# Patient Record
Sex: Female | Born: 1945 | Race: White | Hispanic: No | Smoking: Never smoker
Health system: Southern US, Community
[De-identification: ages and names within clinical notes are randomized; demographics above are authoritative.]

## PROBLEM LIST (undated history)

## (undated) DIAGNOSIS — Z8619 Personal history of other infectious and parasitic diseases: Secondary | ICD-10-CM

## (undated) DIAGNOSIS — E039 Hypothyroidism, unspecified: Secondary | ICD-10-CM

## (undated) DIAGNOSIS — T4145XA Adverse effect of unspecified anesthetic, initial encounter: Secondary | ICD-10-CM

## (undated) DIAGNOSIS — Z9889 Other specified postprocedural states: Secondary | ICD-10-CM

## (undated) DIAGNOSIS — I10 Essential (primary) hypertension: Secondary | ICD-10-CM

## (undated) DIAGNOSIS — Z8679 Personal history of other diseases of the circulatory system: Secondary | ICD-10-CM

## (undated) DIAGNOSIS — R011 Cardiac murmur, unspecified: Secondary | ICD-10-CM

## (undated) DIAGNOSIS — G4730 Sleep apnea, unspecified: Secondary | ICD-10-CM

## (undated) DIAGNOSIS — K219 Gastro-esophageal reflux disease without esophagitis: Secondary | ICD-10-CM

## (undated) DIAGNOSIS — D649 Anemia, unspecified: Secondary | ICD-10-CM

## (undated) DIAGNOSIS — K635 Polyp of colon: Secondary | ICD-10-CM

## (undated) DIAGNOSIS — C4492 Squamous cell carcinoma of skin, unspecified: Secondary | ICD-10-CM

## (undated) DIAGNOSIS — R112 Nausea with vomiting, unspecified: Secondary | ICD-10-CM

## (undated) DIAGNOSIS — B019 Varicella without complication: Secondary | ICD-10-CM

## (undated) DIAGNOSIS — T8859XA Other complications of anesthesia, initial encounter: Secondary | ICD-10-CM

## (undated) DIAGNOSIS — E7800 Pure hypercholesterolemia, unspecified: Secondary | ICD-10-CM

## (undated) DIAGNOSIS — G43909 Migraine, unspecified, not intractable, without status migrainosus: Secondary | ICD-10-CM

## (undated) DIAGNOSIS — E785 Hyperlipidemia, unspecified: Secondary | ICD-10-CM

## (undated) DIAGNOSIS — M5030 Other cervical disc degeneration, unspecified cervical region: Secondary | ICD-10-CM

## (undated) HISTORY — DX: Hyperlipidemia, unspecified: E78.5

## (undated) HISTORY — DX: Pure hypercholesterolemia, unspecified: E78.00

## (undated) HISTORY — DX: Hypothyroidism, unspecified: E03.9

## (undated) HISTORY — DX: Anemia, unspecified: D64.9

## (undated) HISTORY — DX: Migraine, unspecified, not intractable, without status migrainosus: G43.909

## (undated) HISTORY — DX: Varicella without complication: B01.9

## (undated) HISTORY — DX: Other cervical disc degeneration, unspecified cervical region: M50.30

## (undated) HISTORY — DX: Essential (primary) hypertension: I10

## (undated) HISTORY — DX: Squamous cell carcinoma of skin, unspecified: C44.92

## (undated) HISTORY — PX: WISDOM TOOTH EXTRACTION: SHX21

## (undated) HISTORY — PX: SKIN BIOPSY: SHX1

## (undated) HISTORY — DX: Gastro-esophageal reflux disease without esophagitis: K21.9

## (undated) HISTORY — DX: Personal history of other infectious and parasitic diseases: Z86.19

## (undated) HISTORY — DX: Polyp of colon: K63.5

---

## 1992-04-18 HISTORY — PX: VAGINAL HYSTERECTOMY: SUR661

## 1997-11-05 ENCOUNTER — Encounter: Admission: RE | Admit: 1997-11-05 | Discharge: 1997-11-05 | Payer: Self-pay | Admitting: Internal Medicine

## 2004-06-16 ENCOUNTER — Ambulatory Visit: Payer: Self-pay

## 2005-08-31 ENCOUNTER — Ambulatory Visit: Payer: Self-pay

## 2006-12-06 ENCOUNTER — Ambulatory Visit: Payer: Self-pay

## 2007-09-03 ENCOUNTER — Ambulatory Visit: Payer: Self-pay | Admitting: Unknown Physician Specialty

## 2008-02-20 ENCOUNTER — Ambulatory Visit: Payer: Self-pay

## 2009-07-22 ENCOUNTER — Ambulatory Visit: Payer: Self-pay

## 2009-07-24 ENCOUNTER — Ambulatory Visit: Payer: Self-pay

## 2010-02-04 ENCOUNTER — Ambulatory Visit: Payer: Self-pay

## 2010-03-01 ENCOUNTER — Ambulatory Visit: Payer: Self-pay | Admitting: Surgery

## 2010-03-02 LAB — PATHOLOGY REPORT

## 2010-04-18 HISTORY — PX: BREAST BIOPSY: SHX20

## 2010-09-08 ENCOUNTER — Ambulatory Visit: Payer: Self-pay | Admitting: Surgery

## 2011-09-01 ENCOUNTER — Ambulatory Visit: Payer: Self-pay | Admitting: Unknown Physician Specialty

## 2011-09-05 LAB — PATHOLOGY REPORT

## 2011-12-21 ENCOUNTER — Ambulatory Visit: Payer: Self-pay | Admitting: Family Medicine

## 2012-03-19 ENCOUNTER — Encounter: Payer: Self-pay | Admitting: Internal Medicine

## 2012-03-19 ENCOUNTER — Ambulatory Visit (INDEPENDENT_AMBULATORY_CARE_PROVIDER_SITE_OTHER): Payer: Medicare Other | Admitting: Internal Medicine

## 2012-03-19 VITALS — BP 150/90 | HR 67 | Temp 98.3°F | Ht 62.5 in | Wt 198.0 lb

## 2012-03-19 DIAGNOSIS — D649 Anemia, unspecified: Secondary | ICD-10-CM

## 2012-03-19 DIAGNOSIS — K219 Gastro-esophageal reflux disease without esophagitis: Secondary | ICD-10-CM

## 2012-03-19 DIAGNOSIS — E7800 Pure hypercholesterolemia, unspecified: Secondary | ICD-10-CM

## 2012-03-19 DIAGNOSIS — I10 Essential (primary) hypertension: Secondary | ICD-10-CM

## 2012-03-19 DIAGNOSIS — E039 Hypothyroidism, unspecified: Secondary | ICD-10-CM

## 2012-03-19 DIAGNOSIS — D539 Nutritional anemia, unspecified: Secondary | ICD-10-CM | POA: Insufficient documentation

## 2012-03-19 LAB — BASIC METABOLIC PANEL
BUN: 13 mg/dL (ref 6–23)
Calcium: 9.3 mg/dL (ref 8.4–10.5)
GFR: 80.87 mL/min (ref >60.00)
Potassium: 3.6 mEq/L (ref 3.5–5.1)
Sodium: 137 mEq/L (ref 135–145)

## 2012-03-19 LAB — CBC WITH DIFFERENTIAL/PLATELET
Basophils Absolute: 0.1 10*3/uL (ref 0.0–0.1)
Eosinophils Absolute: 0.2 10*3/uL (ref 0.0–0.7)
Lymphocytes Relative: 37.7 % (ref 12.0–46.0)
MCHC: 33.4 g/dL (ref 30.0–36.0)
Neutro Abs: 2.9 10*3/uL (ref 1.4–7.7)
Neutrophils Relative %: 50.1 % (ref 43.0–77.0)
RDW: 12.1 % (ref 11.5–14.6)

## 2012-03-19 LAB — LIPID PANEL
Cholesterol: 251 mg/dL — ABNORMAL HIGH (ref 0–200)
HDL: 95.2 mg/dL (ref >39.00)
Total CHOL/HDL Ratio: 3
VLDL: 18 mg/dL (ref 0.0–40.0)

## 2012-03-19 LAB — HEPATIC FUNCTION PANEL
AST: 20 U/L (ref 0–37)
Total Bilirubin: 0.9 mg/dL (ref 0.3–1.2)

## 2012-03-19 LAB — LDL CHOLESTEROL, DIRECT: Direct LDL: 145.5 mg/dL

## 2012-03-19 NOTE — Assessment & Plan Note (Signed)
On Levothyroxine.  Check tsh with next labs.

## 2012-03-19 NOTE — Progress Notes (Signed)
  Subjective:    Patient ID: Amanda Browning, female    DOB: [birth year], 66 y.o.   MRN: [id number]  HPI 66 year old female with past history of hypertension, hypercholesterolemia, hypothyroidism and GERD who comes in today to follow up on these issues as well as to establish care.  Was seeing Darreld Mclean at [facility].  States her blood pressure is normally well controlled.  Taking her meds regularly.  Saw Dr Markham Jordan.  Had EGD.  States - was "raw".  Is s/p dilatation.  Was on 80mg  of Omeprazole.  Trying to cut back now.  On 20mg  bid now.  Having some break through symptoms.  Bowels stable.  Due a follow up colonoscopy 5/14.  Going to [place] 3-5x/week.    Past Medical History  Diagnosis Date  . GERD (gastroesophageal reflux disease)   . Hypertension   . Hypercholesterolemia   . History of chicken pox   . Migraines   . Colon polyps   . Hypothyroidism     Review of Systems Patient denies any headache, lightheadedness or dizziness.  No significant sinus or allergy symptoms.  No chest pain, tightness or palpitations.  No increased shortness of breath, cough or congestion.  No nausea or vomiting. Break through acid reflux as outlined.   No abdominal pain or cramping.  No bowel change, such as diarrhea, constipation, BRBPR or melana.  No urine change. Is exercising.  Going to [facility].         Objective:   Physical Exam Filed Vitals:   03/19/12 0923  BP: 150/90  Pulse: 67  Temp: 98.3 F (38.95 C)   66 year old female in no acute distress.   HEENT:  Nares - clear.  OP- without lesions or erythema.  NECK:  Supple, nontender.  No audible bruit.   HEART:  Appears to be regular.  I/VI systolic murmur.  LUNGS:  Without crackles or wheezing audible.  Respirations even and unlabored.   RADIAL PULSE:  Equal bilaterally.  ABDOMEN:  Soft, nontender.  No audible abdominal bruit.   EXTREMITIES:  No increased edema to be present.                     Assessment & Plan:  CARDIOVASCULAR.   Had a faint murmur on exam today.  She declines ever being told she had a murmur.  Currently asymptomatic.  Wants to hold on ECHO.  Follow.    FATIGUE.  Did report some fatigue.  Overall doing relatively well.  Check cbc, met c and tsh.   PULMONARY.  Breathing stable.  Follow.    HEALTH MAINTENANCE.  Obtain outside records.  Will do physical at next visit.  States she had her mammogram 8/13 at [facility].  Obtain results.

## 2012-03-19 NOTE — Assessment & Plan Note (Signed)
Blood pressure elevated today.  She states is is under better control at home.  Will have her spot check her pressures and send in for my review.  Get her back in soon to reassess.

## 2012-03-19 NOTE — Assessment & Plan Note (Signed)
Low cholesterol diet and exercise.  On lovastatin.  Check lipid panel and liver function.   

## 2012-03-19 NOTE — Patient Instructions (Addendum)
It was nice meeting you today.  I am glad you have been doing well.  Please monitor your blood pressures for me and send in some readings.  Let me know if you need anything.

## 2012-03-19 NOTE — Assessment & Plan Note (Signed)
Bad reflux.  Was previously having significant problems with dysphagia.  Is s/p EGD 6-7/13.  Saw Dr Markham Jordan.  S/p dilation.  Reports tissue was very inflamed.  On prilosec now.  Was on 80mg  q day.  Has cut down to 20mg  bid.  Having break through symptoms.  Will change to protonix 40mg  bid.  Follow.  May need referral back to GI if persistent problems.

## 2012-03-19 NOTE — Assessment & Plan Note (Signed)
Reports a history of anemia.  Due a follow up colonoscopy 5/14.  Just had EGD.  Check cbc.

## 2012-03-24 ENCOUNTER — Telehealth: Payer: Self-pay | Admitting: Internal Medicine

## 2012-03-24 NOTE — Telephone Encounter (Signed)
Pt notified of lab results and the need to increase lovastatin to 40mg  q day.  See lab message.  Wanted called in to [facility]).  Called in #90 with 3 refills to [facility] [phone]).

## 2012-04-15 ENCOUNTER — Encounter: Payer: Self-pay | Admitting: Internal Medicine

## 2012-05-31 ENCOUNTER — Encounter: Payer: Medicare Other | Admitting: Internal Medicine

## 2012-07-16 ENCOUNTER — Ambulatory Visit (INDEPENDENT_AMBULATORY_CARE_PROVIDER_SITE_OTHER): Payer: Medicare Other | Admitting: Internal Medicine

## 2012-07-16 ENCOUNTER — Other Ambulatory Visit (HOSPITAL_COMMUNITY)
Admission: RE | Admit: 2012-07-16 | Discharge: 2012-07-16 | Disposition: A | Discharge status: Home / Self Care | Payer: Medicare Other | Source: Ambulatory Visit | Attending: Internal Medicine | Admitting: Internal Medicine

## 2012-07-16 ENCOUNTER — Encounter: Payer: Self-pay | Admitting: Internal Medicine

## 2012-07-16 VITALS — BP 134/84 | HR 58 | Temp 98.6°F | Ht 62.5 in | Wt 200.0 lb

## 2012-07-16 DIAGNOSIS — Z124 Encounter for screening for malignant neoplasm of cervix: Secondary | ICD-10-CM

## 2012-07-16 DIAGNOSIS — I10 Essential (primary) hypertension: Secondary | ICD-10-CM

## 2012-07-16 DIAGNOSIS — Z01419 Encounter for gynecological examination (general) (routine) without abnormal findings: Secondary | ICD-10-CM | POA: Insufficient documentation

## 2012-07-16 DIAGNOSIS — Z1211 Encounter for screening for malignant neoplasm of colon: Secondary | ICD-10-CM

## 2012-07-16 DIAGNOSIS — E7800 Pure hypercholesterolemia, unspecified: Secondary | ICD-10-CM

## 2012-07-16 DIAGNOSIS — E039 Hypothyroidism, unspecified: Secondary | ICD-10-CM

## 2012-07-16 DIAGNOSIS — D649 Anemia, unspecified: Secondary | ICD-10-CM

## 2012-07-16 DIAGNOSIS — Z1151 Encounter for screening for human papillomavirus (HPV): Secondary | ICD-10-CM | POA: Insufficient documentation

## 2012-07-16 DIAGNOSIS — K219 Gastro-esophageal reflux disease without esophagitis: Secondary | ICD-10-CM

## 2012-07-16 LAB — BASIC METABOLIC PANEL
Chloride: 95 mEq/L — ABNORMAL LOW (ref 96–112)
Creatinine, Ser: 0.8 mg/dL (ref 0.4–1.2)
Potassium: 3.4 mEq/L — ABNORMAL LOW (ref 3.5–5.1)

## 2012-07-16 LAB — HEPATIC FUNCTION PANEL
AST: 19 U/L (ref 0–37)
Albumin: 3.9 g/dL (ref 3.5–5.2)

## 2012-07-16 LAB — LIPID PANEL
Cholesterol: 230 mg/dL — ABNORMAL HIGH (ref 0–200)
Total CHOL/HDL Ratio: 3
Triglycerides: 86 mg/dL (ref 0.0–149.0)

## 2012-07-16 LAB — LDL CHOLESTEROL, DIRECT: Direct LDL: 128.9 mg/dL

## 2012-07-17 ENCOUNTER — Encounter: Payer: Self-pay | Admitting: Internal Medicine

## 2012-07-17 NOTE — Progress Notes (Signed)
Subjective:    Patient ID: Amanda Browning, female    DOB: May 10, 1945, 67 y.o.   MRN: [id number]  HPI 67 year old female with past history of hypertension, hypercholesterolemia, hypothyroidism and GERD who comes in today to follow up on these issues as well as for a complete physical exam.  States her blood pressure is normally well controlled.  Taking her meds regularly.  Saw Dr Markham Jordan.  Had EGD.  States - was "raw".  Is s/p dilatation.  Was on 80mg  of Omeprazole.  Last visit, she was supposed to start protonix.  Still on omeprazole.  She has tried to only take one pill per day.  Having break through symptoms.  Bowels stable.  Due a follow up colonoscopy 5/14.  Eating and drinking well.  No nausea or vomiting.    Past Medical History  Diagnosis Date  . GERD (gastroesophageal reflux disease)   . Hypertension   . Hypercholesterolemia   . History of chicken pox   . Migraines   . Colon polyps   . Hypothyroidism     Current Outpatient Prescriptions on File Prior to Visit  Medication Sig Dispense Refill  . aspirin 81 MG tablet Take 81 mg by mouth daily.      Marland Kitchen lisinopril-hydrochlorothiazide (PRINZIDE,ZESTORETIC) 20-25 MG per tablet Take 1 tablet by mouth daily.      Marland Kitchen lovastatin (MEVACOR) 40 MG tablet Take 40 mg by mouth at bedtime.      . Multiple Vitamin (MULTIVITAMIN) capsule Take 1 capsule by mouth daily.      Marland Kitchen omeprazole (PRILOSEC) 20 MG capsule Take 20 mg by mouth 2 (two) times daily.       No current facility-administered medications on file prior to visit.    Review of Systems Patient denies any headache, lightheadedness or dizziness.  No significant sinus or allergy symptoms.  No chest pain, tightness or palpitations.  No increased shortness of breath, cough or congestion.  No nausea or vomiting. Break through acid reflux as outlined.   No abdominal pain or cramping.  No bowel change, such as diarrhea, constipation, BRBPR or melana.  No urine change. Is exercising.          Objective:   Physical Exam  Filed Vitals:   07/16/12 0914  BP: 134/84  Pulse: 58  Temp: 98.6 F (37 C)   Blood pressure recheck:  55/8  67 year old female in no acute distress.   HEENT:  Nares- clear.  Oropharynx - without lesions. NECK:  Supple.  Nontender.  No audible bruit.  HEART:  Appears to be regular. LUNGS:  No crackles or wheezing audible.  Respirations even and unlabored.  RADIAL PULSE:  Equal bilaterally.    BREASTS:  No nipple discharge.  Left nipple - flat.  Unchanged/stable.   Could not appreciate any distinct nodules or axillary adenopathy.  ABDOMEN:  Soft, nontender.  Bowel sounds present and normal.  No audible abdominal bruit.  GU:  Normal external genitalia.  Vaginal vault without lesions.  S/p hysterectomy.  Could not appreciate any adnexal masses or tenderness.   RECTAL:  Heme negative.   EXTREMITIES:  No increased edema present.  DP pulses palpable and equal bilaterally.             Assessment & Plan:  CARDIOVASCULAR.  Had a faint murmur on exam today.  She declines ever being told she had a murmur.  Currently asymptomatic.  Wants to hold on ECHO.  Follow.    PULMONARY.  Breathing  stable.  Follow.    HEALTH MAINTENANCE.  Physical today.  States she had her mammogram 8/13 at [facility].  Obtain results.  Due colonoscopy 5/14.

## 2012-07-17 NOTE — Assessment & Plan Note (Addendum)
Reports a history of anemia.  Due a follow up colonoscopy 5/14.  Just had EGD.  Follow cbc.  Hgb 03/19/12 - 13.4.

## 2012-07-17 NOTE — Assessment & Plan Note (Signed)
Blood pressure improved today.  Follow.

## 2012-07-17 NOTE — Assessment & Plan Note (Signed)
On Levothyroxine.  Follow tsh.   

## 2012-07-17 NOTE — Assessment & Plan Note (Signed)
Bad reflux.  Was previously having significant problems with dysphagia.  Is s/p EGD 6-7/13.  Saw Dr Markham Jordan.  S/p dilation.  Reports tissue was very inflamed.  On prilosec now.  Was on 80mg  q day.  Has cut down to 20mg  q day to bid.   Having break through symptoms.  Will change to protonix 40mg  bid.  Follow.  Will refer back to GI for evaluation.  (she is due colonoscopy 5/14).

## 2012-07-17 NOTE — Assessment & Plan Note (Signed)
Low cholesterol diet and exercise.  On lovastatin 40 mg now.  Check lipid panel and liver function.

## 2012-07-21 ENCOUNTER — Telehealth: Payer: Self-pay | Admitting: Internal Medicine

## 2012-07-21 DIAGNOSIS — E878 Other disorders of electrolyte and fluid balance, not elsewhere classified: Secondary | ICD-10-CM

## 2012-07-21 NOTE — Telephone Encounter (Signed)
Pt notified of lab results and need for follow up labs.  She is coming 07/30/12 (2:30 ).  Please put on lab schedule.  Pt aware of appt.

## 2012-07-23 NOTE — Telephone Encounter (Signed)
Appointment made

## 2012-07-30 ENCOUNTER — Ambulatory Visit (INDEPENDENT_AMBULATORY_CARE_PROVIDER_SITE_OTHER): Payer: Medicare Other | Admitting: Adult Health

## 2012-07-30 ENCOUNTER — Other Ambulatory Visit: Payer: Self-pay | Admitting: Internal Medicine

## 2012-07-30 ENCOUNTER — Encounter: Payer: Self-pay | Admitting: Adult Health

## 2012-07-30 ENCOUNTER — Other Ambulatory Visit (INDEPENDENT_AMBULATORY_CARE_PROVIDER_SITE_OTHER): Payer: Medicare Other

## 2012-07-30 ENCOUNTER — Other Ambulatory Visit: Payer: Medicare Other

## 2012-07-30 VITALS — BP 110/76 | HR 69 | Temp 98.2°F | Resp 14 | Wt 203.0 lb

## 2012-07-30 DIAGNOSIS — E878 Other disorders of electrolyte and fluid balance, not elsewhere classified: Secondary | ICD-10-CM

## 2012-07-30 DIAGNOSIS — H6123 Impacted cerumen, bilateral: Secondary | ICD-10-CM

## 2012-07-30 DIAGNOSIS — H6120 Impacted cerumen, unspecified ear: Secondary | ICD-10-CM

## 2012-07-30 LAB — ELECTROLYTE PANEL
CO2: 30 mEq/L (ref 19–32)
Chloride: 94 mEq/L — ABNORMAL LOW (ref 96–112)
Sodium: 133 mEq/L — ABNORMAL LOW (ref 135–145)

## 2012-07-30 NOTE — Progress Notes (Signed)
Order placed for a follow up met b 

## 2012-07-30 NOTE — Patient Instructions (Addendum)
  Your left ear was completely occluded by wax. The right ear was 80% occluded by wax.  We irrigated the ears.  Use equal parts of white vinegar and water. Take a cottonball and moisten it in the solution. Allow a few drops in each ear and cover with the moist and cottonball. Allowed to sit for approximately 30 minutes prior to bathing. During shower, gently irrigate the ear to remove excess solution. You may do this twice a week to help keep wax buildup to a minimum.

## 2012-07-30 NOTE — Progress Notes (Signed)
  Subjective:    Patient ID: Amanda Browning, female    DOB: 04-09-1946, 67 y.o.   MRN: [id number]  HPI  Patient is a pleasant 67 year old female who presents to clinic with fullness in both ears. The left is worse than the right. She has not been swimming. She occasionally gets water in her years while showering. No fever, chills, recent respiratory infections or sinus drainage or pressure. She has tried over-the-counter drops without improvement. Patient describes the fullness as sounds being muffled.   Current Outpatient Prescriptions on File Prior to Visit  Medication Sig Dispense Refill  . aspirin 81 MG tablet Take 81 mg by mouth daily.      Marland Kitchen lisinopril-hydrochlorothiazide (PRINZIDE,ZESTORETIC) 20-25 MG per tablet Take 1 tablet by mouth daily.      Marland Kitchen lovastatin (MEVACOR) 40 MG tablet Take 40 mg by mouth at bedtime.      . Multiple Vitamin (MULTIVITAMIN) capsule Take 1 capsule by mouth daily.      Marland Kitchen omeprazole (PRILOSEC) 20 MG capsule Take 20 mg by mouth 2 (two) times daily.       No current facility-administered medications on file prior to visit.    Review of Systems  HENT: Negative for congestion, sore throat, rhinorrhea, postnasal drip and sinus pressure.        Pressure in both ears. Unable to hear well.  Neurological: Negative for dizziness and light-headedness.     BP 110/76  Pulse 69  Temp(Src) 98.2 F (36.8 C) (Oral)  Resp 14  Wt 203 lb (92.08 kg)  BMI 36.51 kg/m2  SpO2 97%    Objective:   Physical Exam  Constitutional: She is oriented to person, place, and time. She appears well-developed and well-nourished. No distress.  HENT:  Head: Normocephalic and atraumatic.  Left ear with complete occlusion secondary to cerumen impaction. Right ear with partial occlusion secondary to cerumen impaction.  Neurological: She is alert and oriented to person, place, and time.  Psychiatric: She has a normal mood and affect. Her behavior is normal. Judgment and thought content  normal.          Assessment & Plan:

## 2012-07-30 NOTE — Assessment & Plan Note (Signed)
Bilateral ear irrigation for disimpaction of cerumen.

## 2012-08-08 ENCOUNTER — Telehealth: Payer: Self-pay | Admitting: *Deleted

## 2012-08-08 MED ORDER — PANTOPRAZOLE SODIUM 40 MG PO TBEC: 40.0000 mg | DELAYED_RELEASE_TABLET | Freq: Every day | ORAL | Status: DC

## 2012-08-08 MED ORDER — LOVASTATIN 40 MG PO TABS: 40.0000 mg | ORAL_TABLET | Freq: Every day | ORAL | Status: DC

## 2012-08-08 NOTE — Telephone Encounter (Signed)
Pt called to report that mail order pharmacy did not receive all of her refills-resent today

## 2012-11-15 ENCOUNTER — Encounter: Payer: Self-pay | Admitting: Internal Medicine

## 2012-11-15 ENCOUNTER — Ambulatory Visit (INDEPENDENT_AMBULATORY_CARE_PROVIDER_SITE_OTHER): Payer: Medicare Other | Admitting: Internal Medicine

## 2012-11-15 VITALS — BP 110/70 | HR 67 | Temp 98.8°F | Ht 62.5 in | Wt 203.5 lb

## 2012-11-15 DIAGNOSIS — E7800 Pure hypercholesterolemia, unspecified: Secondary | ICD-10-CM

## 2012-11-15 DIAGNOSIS — K219 Gastro-esophageal reflux disease without esophagitis: Secondary | ICD-10-CM

## 2012-11-15 DIAGNOSIS — I10 Essential (primary) hypertension: Secondary | ICD-10-CM

## 2012-11-15 DIAGNOSIS — D649 Anemia, unspecified: Secondary | ICD-10-CM

## 2012-11-15 DIAGNOSIS — E039 Hypothyroidism, unspecified: Secondary | ICD-10-CM

## 2012-11-15 MED ORDER — OMEPRAZOLE 20 MG PO CPDR: 20.0000 mg | DELAYED_RELEASE_CAPSULE | Freq: Every day | ORAL | Status: DC

## 2012-11-15 MED ORDER — LISINOPRIL-HYDROCHLOROTHIAZIDE 20-25 MG PO TABS: 1.0000 | ORAL_TABLET | Freq: Every day | ORAL | Status: DC

## 2012-11-15 MED ORDER — LEVOTHYROXINE SODIUM 25 MCG PO TABS: 25.0000 ug | ORAL_TABLET | Freq: Every day | ORAL | Status: DC

## 2012-11-15 MED ORDER — LOVASTATIN 40 MG PO TABS: 40.0000 mg | ORAL_TABLET | Freq: Every day | ORAL | Status: DC

## 2012-11-18 ENCOUNTER — Encounter: Payer: Self-pay | Admitting: Internal Medicine

## 2012-11-18 NOTE — Assessment & Plan Note (Signed)
Low cholesterol diet and exercise.  On lovastatin 40 mg now.  Follow lipid panel and liver function.    

## 2012-11-18 NOTE — Assessment & Plan Note (Signed)
On Levothyroxine.  Follow tsh.   

## 2012-11-18 NOTE — Assessment & Plan Note (Signed)
Blood pressure improved today.  Follow.

## 2012-11-18 NOTE — Progress Notes (Signed)
  Subjective:    Patient ID: Amanda Browning, female    DOB: [birth year], 67 y.o.   MRN: [id number]  HPI 67 year old female with past history of hypertension, hypercholesterolemia, hypothyroidism and GERD who comes in today for a scheduled follow up.  States her blood pressure is normally well controlled.  Taking her meds regularly.  Saw Dr Markham Jordan.  Had EGD.  States - was "raw".  Is s/p dilatation. Was on 80mg  of Omeprazole.  Now on 20mg  q day and this is controlling her symptoms.  Bowels stable.  Was due a follow up colonoscopy 5/14.  Is planning to have her colonoscopy next week. Eating and drinking well.  No nausea or vomiting.  No abdominal pain or cramping.     Past Medical History  Diagnosis Date  . GERD (gastroesophageal reflux disease)   . Hypertension   . Hypercholesterolemia   . History of chicken pox   . Migraines   . Colon polyps   . Hypothyroidism     Current Outpatient Prescriptions on File Prior to Visit  Medication Sig Dispense Refill  . aspirin 81 MG tablet Take 81 mg by mouth daily.      . Multiple Vitamin (MULTIVITAMIN) capsule Take 1 capsule by mouth daily.       No current facility-administered medications on file prior to visit.    Review of Systems Patient denies any headache, lightheadedness or dizziness.  No significant sinus or allergy symptoms.  No chest pain, tightness or palpitations.  No increased shortness of breath, cough or congestion.  No nausea or vomiting.  Acid reflux controlled on omeprazole 20mg  q day.  No abdominal pain or cramping.  No bowel change, such as diarrhea, constipation, BRBPR or melana.  No urine change. Is exercising.         Objective:   Physical Exam  Filed Vitals:   11/15/12 1420  BP: 110/70  Pulse: 67  Temp: 98.8 F (37.1 C)   Blood pressure recheck:  122/76, pulse 21  67 year old female in no acute distress.   HEENT:  Nares- clear.  Oropharynx - without lesions. NECK:  Supple.  Nontender.  No audible bruit.  HEART:   Appears to be regular. LUNGS:  No crackles or wheezing audible.  Respirations even and unlabored.  RADIAL PULSE:  Equal bilaterally.   ABDOMEN:  Soft, nontender.  Bowel sounds present and normal.  No audible abdominal bruit.   EXTREMITIES:  No increased edema present.  DP pulses palpable and equal bilaterally.             Assessment & Plan:  CARDIOVASCULAR.  Had a faint murmur on exam.   She declines ever being told she had a murmur.  Currently asymptomatic.  Wants to hold on ECHO.  Follow.    PULMONARY.  Breathing stable.  Follow.    HEALTH MAINTENANCE.  Physical 07/16/12.  States she had her mammogram 8/13 at [facility].   Given information to schedule her mammogram.  Was due colonoscopy 5/14.  Is scheduled to have colonoscopy next week.

## 2012-11-18 NOTE — Assessment & Plan Note (Signed)
Is s/p EGD 6-7/13.  Saw Dr Elliot.  S/p dilation.  On priloxec 20mg q day now.  Symptoms controlled.  Follow.   

## 2012-11-18 NOTE — Assessment & Plan Note (Signed)
Reports a history of anemia.  Was due a follow up colonoscopy 5/14.  Is scheduled for colonoscopy next week.  Just had EGD.  Follow cbc.  Hgb 03/19/12 - 13.4.

## 2012-11-19 ENCOUNTER — Ambulatory Visit: Payer: Self-pay | Admitting: Unknown Physician Specialty

## 2012-12-03 ENCOUNTER — Encounter: Payer: Self-pay | Admitting: Unknown Physician Specialty

## 2013-01-15 ENCOUNTER — Encounter: Payer: Self-pay | Admitting: Adult Health

## 2013-01-15 ENCOUNTER — Ambulatory Visit (INDEPENDENT_AMBULATORY_CARE_PROVIDER_SITE_OTHER): Payer: Medicare Other | Admitting: Adult Health

## 2013-01-15 VITALS — BP 120/66 | HR 72 | Temp 98.5°F | Resp 12 | Wt 205.0 lb

## 2013-01-15 DIAGNOSIS — M25511 Pain in right shoulder: Secondary | ICD-10-CM

## 2013-01-15 DIAGNOSIS — Z23 Encounter for immunization: Secondary | ICD-10-CM

## 2013-01-15 DIAGNOSIS — M25561 Pain in right knee: Secondary | ICD-10-CM | POA: Insufficient documentation

## 2013-01-15 DIAGNOSIS — M545 Low back pain, unspecified: Secondary | ICD-10-CM | POA: Insufficient documentation

## 2013-01-15 DIAGNOSIS — M25519 Pain in unspecified shoulder: Secondary | ICD-10-CM

## 2013-01-15 NOTE — Assessment & Plan Note (Signed)
This has significantly improved with ibuprofen and applying ice to the area. Suggested that she continue to apply ice to the area and to take ibuprofen every 6 hours for the next week. Stretching exercises and core exercises are beneficial to strengthen her back. Return to clinic if symptoms are not improved within to 4 weeks.

## 2013-01-15 NOTE — Patient Instructions (Addendum)
  Continue to take ibuprofen 400 mg - 600 mg every 6 hours as needed for pain.  Apply ice to your back and right shoulder 3-4 times a day for 15 minutes at a time.  The best way to protect and strengthen your back is by strengthening your abdominal area or core.  Try to rest your shoulder.   If your symptoms do not improve within 2-4 weeks please call and let us know. If your symptoms worsen let us know immediately.

## 2013-01-15 NOTE — Progress Notes (Signed)
  Subjective:    Patient ID: Amanda Browning, female    DOB: [birth year], 67 y.o.   MRN: [id number]  HPI  Pt is a pleasant 67 year old female with history of hypertension, hyperlipidemia, hypothyroidism, obesity who presents to clinic with complaints of low back pain. She reports that on Thursday she was working out at [employer] and did fine but when she was getting in her car, she developed a shooting pain in her mid lower back. Pt states she used ice and Ibuprofen 400mg  and a back brace. Pt denies urinary sx, denies radiation of pain to buttocks, hips, or legs.  Pt states she is also having right shoulder pain x 2 weeks. Pt states pain is worse with movement. Pt states she occasionally feels tingling down her right arm without numbness.  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     Current Outpatient Prescriptions on File Prior to Visit  Medication Sig Dispense Refill  . aspirin 81 MG tablet Take 81 mg by mouth daily.      Marland Kitchen levothyroxine (SYNTHROID, LEVOTHROID) 25 MCG tablet Take 1 tablet (25 mcg total) by mouth daily before breakfast.  90 tablet  3  . lisinopril-hydrochlorothiazide (PRINZIDE,ZESTORETIC) 20-25 MG per tablet Take 1 tablet by mouth daily.  90 tablet  3  . lovastatin (MEVACOR) 40 MG tablet Take 1 tablet (40 mg total) by mouth at bedtime.  90 tablet  3  . Multiple Vitamin (MULTIVITAMIN) capsule Take 1 capsule by mouth daily.      Marland Kitchen omeprazole (PRILOSEC) 20 MG capsule Take 1 capsule (20 mg total) by mouth daily.  90 capsule  3   No current facility-administered medications on file prior to visit.      Review of Systems  Constitutional: Negative.  Negative for fever.  Genitourinary: Negative.  Negative for dysuria, flank pain and difficulty urinating.  Musculoskeletal: Positive for back pain and arthralgias. Negative for gait problem.       Right shoulder pain and low back pain, worse with  movement       Objective:   Physical Exam  Constitutional: She is oriented to person, place, and time.  Musculoskeletal:       Right shoulder: She exhibits pain. She exhibits normal range of motion.       Lumbar back: She exhibits pain. She exhibits normal range of motion.  Right shoulder pain, worse with abduction. Exhibits pain at ~ 90 angle. Patient with normal ROM when bending at the waist. Did not exhibit any pain with bending.  Neurological: She is alert and oriented to person, place, and time.  Skin: Skin is warm and dry.  Psychiatric: She has a normal mood and affect. Her behavior is normal. Judgment and thought content normal.    BP 120/66  Pulse 72  Temp(Src) 98.5 F (36.9 C) (Oral)  Resp 12  Wt 205 lb (92.987 kg)  BMI 36.87 kg/m2  SpO2 98%       Assessment & Plan:

## 2013-01-15 NOTE — Assessment & Plan Note (Signed)
Suspect this is tendinitis of the right shoulder secondary to repetitive movements associated with her job. Use ibuprofen as directed for her low back pain as well as apply ice to the area. Immobilize as much as possible for the next week or so. 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 If no improvement in 2-4 weeks consider referral to orthopedic.

## 2013-01-28 ENCOUNTER — Ambulatory Visit: Payer: Self-pay | Admitting: Internal Medicine

## 2013-02-03 ENCOUNTER — Other Ambulatory Visit: Payer: Self-pay | Admitting: Internal Medicine

## 2013-02-03 MED ORDER — LOVASTATIN 40 MG PO TABS: 40.0000 mg | ORAL_TABLET | Freq: Every day | ORAL | Status: DC

## 2013-02-03 NOTE — Progress Notes (Signed)
Refilled lovastatin #90 with one refill - [facility]

## 2013-02-04 ENCOUNTER — Telehealth: Payer: Self-pay | Admitting: Internal Medicine

## 2013-02-04 NOTE — Telephone Encounter (Signed)
Pt coming in tomorrow to see Raquel-states that the pain usually comes & goes (better right now) usually worse between 4-6pm.

## 2013-02-04 NOTE — Telephone Encounter (Signed)
If in severe pain, sounds like she needs evaluation soon.  See if Raquel can see and then I can follow up with her after.  Thanks.

## 2013-02-04 NOTE — Telephone Encounter (Signed)
Pt is having bad pain in her neck almost cramp like and very painful. Pt is wanting to get in with Dr. Lorin Picket before December.

## 2013-02-05 ENCOUNTER — Ambulatory Visit: Payer: Self-pay | Admitting: Adult Health

## 2013-02-05 ENCOUNTER — Encounter: Payer: Self-pay | Admitting: Adult Health

## 2013-02-05 ENCOUNTER — Ambulatory Visit (INDEPENDENT_AMBULATORY_CARE_PROVIDER_SITE_OTHER): Payer: Medicare Other | Admitting: Adult Health

## 2013-02-05 VITALS — BP 126/92 | HR 68 | Temp 98.4°F | Wt 199.1 lb

## 2013-02-05 DIAGNOSIS — M542 Cervicalgia: Secondary | ICD-10-CM | POA: Insufficient documentation

## 2013-02-05 DIAGNOSIS — M25511 Pain in right shoulder: Secondary | ICD-10-CM

## 2013-02-05 DIAGNOSIS — M25519 Pain in unspecified shoulder: Secondary | ICD-10-CM

## 2013-02-05 DIAGNOSIS — M5030 Other cervical disc degeneration, unspecified cervical region: Secondary | ICD-10-CM

## 2013-02-05 HISTORY — DX: Other cervical disc degeneration, unspecified cervical region: M50.30

## 2013-02-05 MED ORDER — PREDNISONE 10 MG PO TABS: ORAL_TABLET | ORAL | Status: DC

## 2013-02-05 MED ORDER — METHOCARBAMOL 750 MG PO TABS: 750.0000 mg | ORAL_TABLET | Freq: Three times a day (TID) | ORAL | Status: DC
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
## 2013-02-05 NOTE — Patient Instructions (Signed)
  Start the prednisone taper in the morning. Take 6 tablets the first day with food. Then decrease by 1 tablet daily until complete.  Take robaxin three times a day as needed for muscle spasms.   You can also try aleve or ibuprofen  Please go to [facility] for your xrays. I will contact you with results once they are available.  Continue ice to the area. May try heat if you feel this helps.

## 2013-02-06 ENCOUNTER — Encounter: Payer: Self-pay | Admitting: Adult Health

## 2013-02-07 ENCOUNTER — Telehealth: Payer: Self-pay | Admitting: *Deleted

## 2013-02-07 NOTE — Telephone Encounter (Signed)
Called results of xrays to pt: XR cervical spine: "Degenerative changes in neck (age related). No other bony abnormality" and right shoulder xray: "Degenerative changes of shoulder joint" per Raquel. Pt verbalized understanding.

## 2013-02-12 ENCOUNTER — Encounter: Payer: Self-pay | Admitting: Internal Medicine

## 2013-02-25 ENCOUNTER — Encounter: Payer: Self-pay | Admitting: Adult Health

## 2013-03-01 ENCOUNTER — Telehealth: Payer: Self-pay | Admitting: Internal Medicine

## 2013-03-01 NOTE — Telephone Encounter (Signed)
FYI

## 2013-03-01 NOTE — Telephone Encounter (Signed)
Noted  

## 2013-03-01 NOTE — Telephone Encounter (Signed)
Patient Information:  Caller Name: Reagann  Phone: [phone]  Patient: Amanda, Browning  Gender: Female  DOB: [birth year]  Age: 67 Years  PCP: [place]  Office Follow Up:  Does the office need to follow up with this patient?: No  Instructions For The Office: N/A  RN Note:  Does not want Appt, wanting to try home measures first.  Symptoms  Reason For Call & Symptoms: Started voice hoarseness, scratchy throat yesterday pm.  Noting throbbing in Right ear at intervals.  Reviewed Health History In EMR: Yes  Reviewed Medications In EMR: Yes  Reviewed Allergies In EMR: Yes  Reviewed Surgeries / Procedures: Yes  Date of Onset of Symptoms: 02/28/2013  Treatments Tried: throat lozengers  Treatments Tried Worked: Yes  Guideline(s) Used:  Sore Throat  Disposition Per Guideline:   See Today in Office  Reason For Disposition Reached:   Earache also present  Advice Given:  For Relief of Sore Throat Pain:  Sip warm chicken broth or apple juice.  Suck on hard candy or a throat lozenge (over-the-counter).  Gargle warm salt water 3 times daily (1 teaspoon of salt in 8 oz or 240 ml of warm water).  Liquids:  Adequate liquid intake is important to prevent dehydration. Drink 6-8 glasses of water per day.  Expected Course:  Sore throats with viral illnesses usually last 3 or 4 days.  Call Back If:  Sore throat is the main symptom and it lasts longer than 24 hours  Sore throat is mild but lasts longer than 4 days  Fever lasts longer than 3 days  Patient Refused Recommendation:  Patient Will Follow Up With Office Later  Wanting to try home care first

## 2013-03-04 ENCOUNTER — Ambulatory Visit (INDEPENDENT_AMBULATORY_CARE_PROVIDER_SITE_OTHER): Payer: Medicare Other | Admitting: Internal Medicine

## 2013-03-04 ENCOUNTER — Encounter: Payer: Self-pay | Admitting: Internal Medicine

## 2013-03-04 VITALS — BP 130/80 | HR 72 | Temp 98.5°F | Ht 62.5 in | Wt 200.5 lb

## 2013-03-04 DIAGNOSIS — J069 Acute upper respiratory infection, unspecified: Secondary | ICD-10-CM

## 2013-03-04 MED ORDER — CEFUROXIME AXETIL 250 MG PO TABS: 250.0000 mg | ORAL_TABLET | Freq: Two times a day (BID) | ORAL | Status: DC

## 2013-03-04 MED ORDER — FLUTICASONE PROPIONATE 50 MCG/ACT NA SUSP: 2.0000 | Freq: Every day | NASAL | Status: DC

## 2013-03-04 MED ORDER — ALBUTEROL SULFATE HFA 108 (90 BASE) MCG/ACT IN AERS: 2.0000 | INHALATION_SPRAY | Freq: Four times a day (QID) | RESPIRATORY_TRACT | Status: DC | PRN

## 2013-03-04 NOTE — Patient Instructions (Signed)
Saline nasal spray - flush nose at least 2-3x/day.  Flonase nasal spray - 2 sprays each nostril one time per day (do this in the evening).  mucinex - one tablet in the am and robitussin in the evening.   Take the antibiotics as directed.  Albuterol inhaler as directed.  Let me know if problems.

## 2013-03-04 NOTE — Progress Notes (Signed)
  Subjective:    Patient ID: Amanda Browning, female    DOB: Jul 04, 1945, 67 y.o.   MRN: [id number]  URI   68 year old female with past history of hypertension, hypercholesterolemia, hypothyroidism and GERD who comes in today as a work in with concerns regarding increased cough and congestion.  States symptoms started five to six days ago.  Started with a scratchy throat.  Hurt to swallow.  Minimal headache yesterday.  None today.  No sore throat now.  Now with increased nasal congestion - occasional colored mucus.  Some increased cough and congestion.  Cough productive of colored mucus.  Raspy voice.  Some occasional tightness noted, but no significant sob.  No chest pain.  No vomiting.  Tolerating po's.    Past Medical History  Diagnosis Date  . GERD (gastroesophageal reflux disease)   . Hypertension   . Hypercholesterolemia   . History of chicken pox   . Migraines   . Colon polyps   . Hypothyroidism   . DDD (degenerative disc disease), cervical 02/05/13    C5-C6 and C6-C7    Current Outpatient Prescriptions on File Prior to Visit  Medication Sig Dispense Refill  . aspirin 81 MG tablet Take 81 mg by mouth daily.      Marland Kitchen levothyroxine (SYNTHROID, LEVOTHROID) 25 MCG tablet Take 1 tablet (25 mcg total) by mouth daily before breakfast.  90 tablet  3  . lisinopril-hydrochlorothiazide (PRINZIDE,ZESTORETIC) 20-25 MG per tablet Take 1 tablet by mouth daily.  90 tablet  3  . lovastatin (MEVACOR) 40 MG tablet Take 1 tablet (40 mg total) by mouth at bedtime.  90 tablet  1  . Multiple Vitamin (MULTIVITAMIN) capsule Take 1 capsule by mouth daily.      Marland Kitchen omeprazole (PRILOSEC) 20 MG capsule Take 1 capsule (20 mg total) by mouth daily.  90 capsule  3   No current facility-administered medications on file prior to visit.    Review of Systems Patient denies any headache headache now.  Minimal headache yesterday.  No lightheadedness or dizziness.  Some nasal congestion and cough as outlined.  No fever  reported.   No nausea or vomiting.  Acid reflux controlled on omeprazole 20mg  q day.   No bowel change.  Tolerating po's.  Has taken otc benadryl and dayquil.         Objective:   Physical Exam  Filed Vitals:   03/04/13 1136  BP: 130/80  Pulse: 72  Temp: 98.5 F (29.5 C)   67 year old female in no acute distress.   HEENT:  Nares- slightly erythematous turbinates.  No significant tenderness to palpation.  TMs visualized - no erythema.  Oropharynx - without lesions. NECK:  Supple.  Nontender.    HEART:  Appears to be regular. LUNGS:  No crackles or wheezing audible.  Respirations even and unlabored.             Assessment & Plan:  CARDIOVASCULAR.  Had a faint murmur on exam.   She declines ever being told she had a murmur.  Currently asymptomatic.  Wants to hold on ECHO.  Follow.     HEALTH MAINTENANCE.  Physical 07/16/12.  States she had her mammogram 8/13 at [facility].   Given information to schedule her mammogram last visit.

## 2013-03-04 NOTE — Telephone Encounter (Signed)
Pt aware of appointment  11/17 @ 11:45

## 2013-03-04 NOTE — Progress Notes (Signed)
Pre-visit discussion using our clinic review tool. No additional management support is needed unless otherwise documented below in the visit note.  

## 2013-03-04 NOTE — Telephone Encounter (Signed)
Pt still having cold symptoms, no appointments available today.  Can pt be seen today for cold/sore throat?  States she cannot come tomorrow.

## 2013-03-05 ENCOUNTER — Encounter: Payer: Self-pay | Admitting: Internal Medicine

## 2013-03-05 DIAGNOSIS — J069 Acute upper respiratory infection, unspecified: Secondary | ICD-10-CM | POA: Insufficient documentation

## 2013-03-05 NOTE — Assessment & Plan Note (Signed)
Treat with ceftin as directed.  Mucinex in the am and robitussin in the pm.  Saline nasal flushes 2-3x/day and flonase as directed.  Albuterol inhaler as directed.  Rest.  Fluids.  Explained to her if symptoms changed, worsened or did not resolve - she was to be reevaluated.

## 2013-03-19 ENCOUNTER — Encounter (INDEPENDENT_AMBULATORY_CARE_PROVIDER_SITE_OTHER): Payer: Self-pay

## 2013-03-19 ENCOUNTER — Encounter: Payer: Self-pay | Admitting: Internal Medicine

## 2013-03-19 ENCOUNTER — Ambulatory Visit (INDEPENDENT_AMBULATORY_CARE_PROVIDER_SITE_OTHER): Payer: Medicare Other | Admitting: Internal Medicine

## 2013-03-19 VITALS — BP 120/80 | HR 64 | Temp 98.1°F | Ht 62.5 in | Wt 201.0 lb

## 2013-03-19 DIAGNOSIS — M25519 Pain in unspecified shoulder: Secondary | ICD-10-CM

## 2013-03-19 DIAGNOSIS — R252 Cramp and spasm: Secondary | ICD-10-CM | POA: Insufficient documentation

## 2013-03-19 DIAGNOSIS — D649 Anemia, unspecified: Secondary | ICD-10-CM

## 2013-03-19 DIAGNOSIS — K219 Gastro-esophageal reflux disease without esophagitis: Secondary | ICD-10-CM

## 2013-03-19 DIAGNOSIS — E7800 Pure hypercholesterolemia, unspecified: Secondary | ICD-10-CM

## 2013-03-19 DIAGNOSIS — M25511 Pain in right shoulder: Secondary | ICD-10-CM

## 2013-03-19 DIAGNOSIS — E039 Hypothyroidism, unspecified: Secondary | ICD-10-CM

## 2013-03-19 DIAGNOSIS — J069 Acute upper respiratory infection, unspecified: Secondary | ICD-10-CM

## 2013-03-19 DIAGNOSIS — I10 Essential (primary) hypertension: Secondary | ICD-10-CM

## 2013-03-19 LAB — BASIC METABOLIC PANEL
BUN: 13 mg/dL (ref 6–23)
CO2: 29 mEq/L (ref 19–32)
Chloride: 100 mEq/L (ref 96–112)
Creatinine, Ser: 0.9 mg/dL (ref 0.4–1.2)
Glucose, Bld: 86 mg/dL (ref 70–99)
Potassium: 3.6 mEq/L (ref 3.5–5.1)

## 2013-03-19 LAB — HEPATIC FUNCTION PANEL
ALT: 22 U/L (ref 0–35)
AST: 25 U/L (ref 0–37)
Albumin: 3.8 g/dL (ref 3.5–5.2)
Alkaline Phosphatase: 52 U/L (ref 39–117)
Total Protein: 6.3 g/dL (ref 6.0–8.3)

## 2013-03-19 LAB — LIPID PANEL
Cholesterol: 226 mg/dL — ABNORMAL HIGH (ref 0–200)
HDL: 79.6 mg/dL (ref >39.00)
Triglycerides: 82 mg/dL (ref 0.0–149.0)
VLDL: 16.4 mg/dL (ref 0.0–40.0)

## 2013-03-19 LAB — LDL CHOLESTEROL, DIRECT: Direct LDL: 131 mg/dL

## 2013-03-19 NOTE — Assessment & Plan Note (Signed)
Reports a history of anemia.  Just had EGD and colonoscopy.   Follow cbc.  Hgb 03/19/12 - 13.4.

## 2013-03-19 NOTE — Assessment & Plan Note (Signed)
Is s/p EGD 6-7/13.  Saw Dr Elliot.  S/p dilation.  On priloxec 20mg q day now.  Symptoms controlled.  Follow.   

## 2013-03-19 NOTE — Progress Notes (Signed)
Subjective:    Patient ID: Amanda Browning, female    DOB: 1946-03-02, 67 y.o.   MRN: [id number]  HPI 67 year old female with past history of hypertension, hypercholesterolemia, hypothyroidism and GERD who comes in today for a scheduled follow up.  States her blood pressure is doing well.  Taking her meds regularly.  Saw Dr Markham Jordan.  Had EGD.  States - was "raw".  Is s/p dilatation. Was on 80mg  of Omeprazole.  Now on 20mg  q day and this is controlling her symptoms.  Bowels stable.  Colonoscopy 11/19/12 revealed internal hemorrhoids.  Recommended recheck five years.   No nausea or vomiting.  No abdominal pain or cramping.  Was recently evaluated for URI.  See last note for details.  Feels better.  Still has some increased drainage with some associated cough.  No chest congestion or wheezing.  No sob.  She is having some problems with leg cramps.  Occurs most every night.  Is staying hydrated.  She also reports right shoulder pain.  Increased pain with certain movements.  No known injury.     Past Medical History  Diagnosis Date  . GERD (gastroesophageal reflux disease)   . Hypertension   . Hypercholesterolemia   . History of chicken pox   . Migraines   . Colon polyps   . Hypothyroidism   . DDD (degenerative disc disease), cervical 02/05/13    C5-C6 and C6-C7    Current Outpatient Prescriptions on File Prior to Visit  Medication Sig Dispense Refill  . albuterol (PROVENTIL HFA;VENTOLIN HFA) 108 (90 BASE) MCG/ACT inhaler Inhale 2 puffs into the lungs every 6 (six) hours as needed for wheezing or shortness of breath.  1 Inhaler  0  . aspirin 81 MG tablet Take 81 mg by mouth daily.      . fluticasone (FLONASE) 50 MCG/ACT nasal spray Place 2 sprays into both nostrils daily.  16 g  1  . levothyroxine (SYNTHROID, LEVOTHROID) 25 MCG tablet Take 1 tablet (25 mcg total) by mouth daily before breakfast.  90 tablet  3  . lisinopril-hydrochlorothiazide (PRINZIDE,ZESTORETIC) 20-25 MG per tablet Take 1 tablet  by mouth daily.  90 tablet  3  . lovastatin (MEVACOR) 40 MG tablet Take 1 tablet (40 mg total) by mouth at bedtime.  90 tablet  1  . Multiple Vitamin (MULTIVITAMIN) capsule Take 1 capsule by mouth daily.      Marland Kitchen omeprazole (PRILOSEC) 20 MG capsule Take 1 capsule (20 mg total) by mouth daily.  90 capsule  3   No current facility-administered medications on file prior to visit.    Review of Systems Patient denies any headache, lightheadedness or dizziness.  Some drainage.  No chest pain, tightness or palpitations.  No increased shortness of breath.  Some cough.  No chest congestion.   No nausea or vomiting.  Acid reflux controlled on omeprazole 20mg  q day.  No abdominal pain or cramping.  No bowel change, such as diarrhea, constipation, BRBPR or melana.  No urine change.  Leg cramps as outlined.  Right shoulder pain as outlined.        Objective:   Physical Exam  Filed Vitals:   03/19/13 0802  BP: 120/80  Pulse: 64  Temp: 98.1 F (104.38 C)   67 year old female in no acute distress.   HEENT:  Nares- clear.  Oropharynx - without lesions. NECK:  Supple.  Nontender.  No audible bruit.  HEART:  Appears to be regular. LUNGS:  No crackles or  wheezing audible.  Respirations even and unlabored.  RADIAL PULSE:  Equal bilaterally.   ABDOMEN:  Soft, nontender.  Bowel sounds present and normal.  No audible abdominal bruit.   EXTREMITIES:  No increased edema present.  DP pulses palpable and equal bilaterally.  MSK:  Some minimal shoulder discomfort with abduction and full extension.              Assessment & Plan:  CARDIOVASCULAR.  Had a faint murmur on exam.   She declines ever being told she had a murmur.  Currently asymptomatic.  Wants to hold on ECHO.  Follow.    PULMONARY.  Breathing stable.  Follow.    HEALTH MAINTENANCE.  Physical 07/16/12.  Mammogram 01/28/13 - Birads I.  Colonoscopy 11/19/12 - internal hemorrhoids.  Recommend f/u colonoscopy in five years.

## 2013-03-19 NOTE — Assessment & Plan Note (Signed)
Will check electrolytes, calcium and magnesium.  She stays hydrated.  Discussed various treatment options.  May try magnesium.

## 2013-03-19 NOTE — Assessment & Plan Note (Signed)
Better.  Some drainage and cough.  I feel the cough is coming from the drainage.  Lungs are clear.  Continue zyrtec.  Restart the saline nasal flushes and flonase.  Notify me if persistent.

## 2013-03-19 NOTE — Assessment & Plan Note (Signed)
Low cholesterol diet and exercise.  On lovastatin 40 mg now.  Follow lipid panel and liver function.    

## 2013-03-19 NOTE — Assessment & Plan Note (Signed)
Persistent.  Discussed further w/up.  She declines.  Discussed xray.  She wants to monitor.  Will notify me if she changes her mind.

## 2013-03-19 NOTE — Assessment & Plan Note (Signed)
Blood pressure doing well.  Follow.  

## 2013-03-19 NOTE — Assessment & Plan Note (Signed)
On Levothyroxine.  Follow tsh.   

## 2013-03-19 NOTE — Progress Notes (Signed)
Pre-visit discussion using our clinic review tool. No additional management support is needed unless otherwise documented below in the visit note.  

## 2013-03-22 ENCOUNTER — Telehealth: Payer: Self-pay | Admitting: *Deleted

## 2013-03-22 MED ORDER — MAGNESIUM OXIDE 400 MG PO TABS: 400.0000 mg | ORAL_TABLET | Freq: Every day | ORAL | Status: DC

## 2013-03-22 NOTE — Telephone Encounter (Signed)
rx for mag ox sent to pharmacy, see result note. Pt aware

## 2013-05-02 ENCOUNTER — Other Ambulatory Visit: Payer: Self-pay | Admitting: *Deleted

## 2013-05-02 MED ORDER — LOVASTATIN 40 MG PO TABS: 40.0000 mg | ORAL_TABLET | Freq: Every day | ORAL | Status: DC

## 2013-05-02 MED ORDER — LISINOPRIL-HYDROCHLOROTHIAZIDE 20-25 MG PO TABS: 1.0000 | ORAL_TABLET | Freq: Every day | ORAL | Status: DC

## 2013-05-02 MED ORDER — LEVOTHYROXINE SODIUM 25 MCG PO TABS: 25.0000 ug | ORAL_TABLET | Freq: Every day | ORAL | Status: DC

## 2013-05-02 NOTE — Telephone Encounter (Signed)
Refill request  Pantoprazole

## 2013-08-13 ENCOUNTER — Encounter: Payer: Self-pay | Admitting: Internal Medicine

## 2013-08-13 ENCOUNTER — Ambulatory Visit (INDEPENDENT_AMBULATORY_CARE_PROVIDER_SITE_OTHER): Payer: Commercial Managed Care - HMO | Admitting: Internal Medicine

## 2013-08-13 ENCOUNTER — Encounter (INDEPENDENT_AMBULATORY_CARE_PROVIDER_SITE_OTHER): Payer: Self-pay

## 2013-08-13 VITALS — BP 140/80 | HR 61 | Temp 98.1°F | Ht 61.2 in | Wt 203.2 lb

## 2013-08-13 DIAGNOSIS — I10 Essential (primary) hypertension: Secondary | ICD-10-CM

## 2013-08-13 DIAGNOSIS — E7800 Pure hypercholesterolemia, unspecified: Secondary | ICD-10-CM

## 2013-08-13 DIAGNOSIS — D649 Anemia, unspecified: Secondary | ICD-10-CM

## 2013-08-13 DIAGNOSIS — M25511 Pain in right shoulder: Secondary | ICD-10-CM

## 2013-08-13 DIAGNOSIS — M25519 Pain in unspecified shoulder: Secondary | ICD-10-CM

## 2013-08-13 DIAGNOSIS — E039 Hypothyroidism, unspecified: Secondary | ICD-10-CM

## 2013-08-13 DIAGNOSIS — E2839 Other primary ovarian failure: Secondary | ICD-10-CM

## 2013-08-13 DIAGNOSIS — K219 Gastro-esophageal reflux disease without esophagitis: Secondary | ICD-10-CM

## 2013-08-13 LAB — HEPATIC FUNCTION PANEL
ALBUMIN: 4 g/dL (ref 3.5–5.2)
ALT: 20 U/L (ref 0–35)
AST: 24 U/L (ref 0–37)
Alkaline Phosphatase: 52 U/L (ref 39–117)
Bilirubin, Direct: 0.1 mg/dL (ref 0.0–0.3)
TOTAL PROTEIN: 6.5 g/dL (ref 6.0–8.3)
Total Bilirubin: 0.7 mg/dL (ref 0.3–1.2)

## 2013-08-13 LAB — CBC WITH DIFFERENTIAL/PLATELET
BASOS ABS: 0.1 10*3/uL (ref 0.0–0.1)
Basophils Relative: 0.9 % (ref 0.0–3.0)
EOS PCT: 4.5 % (ref 0.0–5.0)
Eosinophils Absolute: 0.3 10*3/uL (ref 0.0–0.7)
HEMATOCRIT: 32.1 % — AB (ref 36.0–46.0)
Hemoglobin: 10.1 g/dL — ABNORMAL LOW (ref 12.0–15.0)
LYMPHS ABS: 2.2 10*3/uL (ref 0.7–4.0)
Lymphocytes Relative: 36.4 % (ref 12.0–46.0)
MCHC: 31.4 g/dL (ref 30.0–36.0)
MCV: 82.6 fl (ref 78.0–100.0)
MONOS PCT: 10.8 % (ref 3.0–12.0)
Monocytes Absolute: 0.7 10*3/uL (ref 0.1–1.0)
Neutro Abs: 2.9 10*3/uL (ref 1.4–7.7)
Neutrophils Relative %: 47.4 % (ref 43.0–77.0)
Platelets: 237 10*3/uL (ref 150.0–400.0)
RBC: 3.89 Mil/uL (ref 3.87–5.11)
RDW: 17.8 % — ABNORMAL HIGH (ref 11.5–14.6)
WBC: 6.1 10*3/uL (ref 4.5–10.5)

## 2013-08-13 LAB — LIPID PANEL
Cholesterol: 204 mg/dL — ABNORMAL HIGH (ref 0–200)
HDL: 88.8 mg/dL (ref >39.00)
LDL CALC: 102 mg/dL — AB (ref 0–99)
TRIGLYCERIDES: 65 mg/dL (ref 0.0–149.0)
Total CHOL/HDL Ratio: 2
VLDL: 13 mg/dL (ref 0.0–40.0)

## 2013-08-13 LAB — BASIC METABOLIC PANEL
BUN: 10 mg/dL (ref 6–23)
CALCIUM: 10.1 mg/dL (ref 8.4–10.5)
CO2: 33 mEq/L — ABNORMAL HIGH (ref 19–32)
Chloride: 100 mEq/L (ref 96–112)
Creatinine, Ser: 0.7 mg/dL (ref 0.4–1.2)
GFR: 91.56 mL/min (ref >60.00)
GLUCOSE: 87 mg/dL (ref 70–99)
POTASSIUM: 3.9 meq/L (ref 3.5–5.1)
Sodium: 141 mEq/L (ref 135–145)

## 2013-08-13 LAB — TSH: TSH: 2.42 u[IU]/mL (ref 0.35–5.50)

## 2013-08-13 LAB — MAGNESIUM: MAGNESIUM: 1.6 mg/dL (ref 1.5–2.5)

## 2013-08-13 MED ORDER — LISINOPRIL-HYDROCHLOROTHIAZIDE 20-25 MG PO TABS: 1.0000 | ORAL_TABLET | Freq: Every day | ORAL | Status: DC

## 2013-08-13 MED ORDER — OMEPRAZOLE 20 MG PO CPDR: 20.0000 mg | DELAYED_RELEASE_CAPSULE | Freq: Every day | ORAL | Status: DC

## 2013-08-13 MED ORDER — LEVOTHYROXINE SODIUM 25 MCG PO TABS: 25.0000 ug | ORAL_TABLET | Freq: Every day | ORAL | Status: DC

## 2013-08-13 MED ORDER — LOVASTATIN 40 MG PO TABS: 40.0000 mg | ORAL_TABLET | Freq: Every day | ORAL | Status: DC

## 2013-08-13 MED ORDER — CETIRIZINE HCL 10 MG PO TABS: 10.0000 mg | ORAL_TABLET | Freq: Every day | ORAL | Status: DC

## 2013-08-13 NOTE — Progress Notes (Signed)
Subjective:    Patient ID: Amanda Browning, female    DOB: May 20, 1945, 68 y.o.   MRN: [id number]  HPI 68 year old female with past history of hypertension, hypercholesterolemia, hypothyroidism and GERD who comes in today to follow up on these issues as well as for a complete physical exam.  States her blood pressure is doing well.  Taking her meds regularly.  Saw Dr Tiffany Kocher.  Had EGD.  States - was "raw".  Is s/p dilatation. Was on 80mg  of Omeprazole. Now on 20mg  q day and this is controlling her symptoms.  Bowels stable.  Colonoscopy 11/19/12 revealed internal hemorrhoids.  Recommended recheck five years.   No nausea or vomiting.  No abdominal pain or cramping.   No chest congestion or wheezing.  No sob or cough.  Her main complaint is that of right shoulder pain.  Increased pulling sensation with using her arm.  Increased pain with abduction.  Has been using essential oils.  Request referral to see ortho (Dr Marry Guan).     Past Medical History  Diagnosis Date  . GERD (gastroesophageal reflux disease)   . Hypertension   . Hypercholesterolemia   . History of chicken pox   . Migraines   . Colon polyps   . Hypothyroidism   . DDD (degenerative disc disease), cervical 02/05/13    C5-C6 and C6-C7    Current Outpatient Prescriptions on File Prior to Visit  Medication Sig Dispense Refill  . albuterol (PROVENTIL HFA;VENTOLIN HFA) 108 (90 BASE) MCG/ACT inhaler Inhale 2 puffs into the lungs every 6 (six) hours as needed for wheezing or shortness of breath.  1 Inhaler  0  . aspirin 81 MG tablet Take 81 mg by mouth daily.      . fluticasone (FLONASE) 50 MCG/ACT nasal spray Place 2 sprays into both nostrils daily.  16 g  1  . levothyroxine (SYNTHROID, LEVOTHROID) 25 MCG tablet Take 1 tablet (25 mcg total) by mouth daily before breakfast.  90 tablet  0  . lisinopril-hydrochlorothiazide (PRINZIDE,ZESTORETIC) 20-25 MG per tablet Take 1 tablet by mouth daily.  90 tablet  1  . lovastatin (MEVACOR) 40 MG tablet  Take 1 tablet (40 mg total) by mouth at bedtime.  90 tablet  1  . magnesium oxide (MAG-OX) 400 MG tablet Take 1 tablet (400 mg total) by mouth daily.  30 tablet  2  . Multiple Vitamin (MULTIVITAMIN) capsule Take 1 capsule by mouth daily.      Marland Kitchen omeprazole (PRILOSEC) 20 MG capsule Take 1 capsule (20 mg total) by mouth daily.  90 capsule  3   No current facility-administered medications on file prior to visit.    Review of Systems Patient denies any headache, lightheadedness or dizziness.  No significant sinus or allergy symptoms.  No chest pain, tightness or palpitations.  No increased shortness of breath.  No cough.  No chest congestion.   No nausea or vomiting.  Acid reflux controlled on omeprazole 20mg  q day.  No abdominal pain or cramping.  No bowel change, such as diarrhea, constipation, BRBPR or melana.  No urine change.   Right shoulder pain as outlined.       Objective:   Physical Exam  Filed Vitals:   08/13/13 0831  BP: 140/80  Pulse: 61  Temp: 98.1 F (59.41 C)   68 year old female in no acute distress.   HEENT:  Nares- clear.  Oropharynx - without lesions. NECK:  Supple.  Nontender.  No audible bruit.  HEART:  Appears to be regular. LUNGS:  No crackles or wheezing audible.  Respirations even and unlabored.  RADIAL PULSE:  Equal bilaterally.    BREASTS:  No nipple discharge or nipple retraction present.  Could not appreciate any distinct nodules or axillary adenopathy.  ABDOMEN:  Soft, nontender.  Bowel sounds present and normal.  No audible abdominal bruit.  GU:  Not performed.     EXTREMITIES:  No increased edema present.  DP pulses palpable and equal bilaterally.            Assessment & Plan:  CARDIOVASCULAR.  Had a faint murmur on exam.   She declines ever being told she had a murmur.  Currently asymptomatic.  Wants to hold on ECHO.  Follow.    PULMONARY.  Breathing stable.  Follow.    HEALTH MAINTENANCE.  Physical today.  Mammogram 01/28/13 - Birads I.  Colonoscopy  11/19/12 - internal hemorrhoids.  Recommend f/u colonoscopy in five years.     I spent 25 minutes with the patient and more than 50% or the time was spent in consultation regarding the above.

## 2013-08-13 NOTE — Progress Notes (Signed)
Pre visit review using our clinic review tool, if applicable. No additional management support is needed unless otherwise documented below in the visit note. 

## 2013-08-14 ENCOUNTER — Other Ambulatory Visit: Payer: Self-pay | Admitting: Internal Medicine

## 2013-08-14 DIAGNOSIS — D649 Anemia, unspecified: Secondary | ICD-10-CM

## 2013-08-14 NOTE — Progress Notes (Signed)
Order placed for f/u labs.  

## 2013-08-16 ENCOUNTER — Other Ambulatory Visit (INDEPENDENT_AMBULATORY_CARE_PROVIDER_SITE_OTHER): Payer: Commercial Managed Care - HMO

## 2013-08-16 DIAGNOSIS — D649 Anemia, unspecified: Secondary | ICD-10-CM

## 2013-08-16 LAB — CBC WITH DIFFERENTIAL/PLATELET
BASOS ABS: 0 10*3/uL (ref 0.0–0.1)
BASOS PCT: 0.7 % (ref 0.0–3.0)
Eosinophils Absolute: 0.2 10*3/uL (ref 0.0–0.7)
Eosinophils Relative: 4 % (ref 0.0–5.0)
HEMATOCRIT: 30.3 % — AB (ref 36.0–46.0)
HEMOGLOBIN: 9.7 g/dL — AB (ref 12.0–15.0)
LYMPHS ABS: 2.2 10*3/uL (ref 0.7–4.0)
LYMPHS PCT: 37.3 % (ref 12.0–46.0)
MCHC: 31.9 g/dL (ref 30.0–36.0)
MCV: 83 fl (ref 78.0–100.0)
Monocytes Absolute: 0.6 10*3/uL (ref 0.1–1.0)
Monocytes Relative: 10.2 % (ref 3.0–12.0)
NEUTROS ABS: 2.9 10*3/uL (ref 1.4–7.7)
Neutrophils Relative %: 47.8 % (ref 43.0–77.0)
Platelets: 224 10*3/uL (ref 150.0–400.0)
RBC: 3.65 Mil/uL — AB (ref 3.87–5.11)
RDW: 17.5 % — AB (ref 11.5–14.6)
WBC: 6 10*3/uL (ref 4.5–10.5)

## 2013-08-16 LAB — VITAMIN B12: Vitamin B-12: 654 pg/mL (ref 211–911)

## 2013-08-16 LAB — IBC PANEL
IRON: 76 ug/dL (ref 42–145)
Saturation Ratios: 16.3 % — ABNORMAL LOW (ref 20.0–50.0)
TRANSFERRIN: 333.2 mg/dL (ref 212.0–360.0)

## 2013-08-16 LAB — FERRITIN: Ferritin: 6.2 ng/mL — ABNORMAL LOW (ref 10.0–291.0)

## 2013-08-17 ENCOUNTER — Encounter: Payer: Self-pay | Admitting: Internal Medicine

## 2013-08-17 NOTE — Assessment & Plan Note (Signed)
On Levothyroxine.  Follow tsh.   

## 2013-08-17 NOTE — Assessment & Plan Note (Signed)
Blood pressure doing well.  Follow.  

## 2013-08-17 NOTE — Assessment & Plan Note (Signed)
Reports a history of anemia.  Just had EGD and colonoscopy.   Follow cbc.  Recheck today.Amanda Browning

## 2013-08-17 NOTE — Assessment & Plan Note (Signed)
Is s/p EGD 6-7/13.  Saw Dr Elliot.  S/p dilation.  On priloxec 20mg q day now.  Symptoms controlled.  Follow.   

## 2013-08-17 NOTE — Assessment & Plan Note (Signed)
Persistent.  Discussed further w/up.  She request referral to ortho given persistent pain and problems.

## 2013-08-17 NOTE — Assessment & Plan Note (Signed)
Low cholesterol diet and exercise.  On lovastatin 40 mg now.  Follow lipid panel and liver function.    

## 2013-08-19 ENCOUNTER — Other Ambulatory Visit: Payer: Self-pay | Admitting: Internal Medicine

## 2013-08-19 DIAGNOSIS — D649 Anemia, unspecified: Secondary | ICD-10-CM

## 2013-08-19 NOTE — Progress Notes (Signed)
Order placed for f/u hgb.

## 2013-08-29 ENCOUNTER — Other Ambulatory Visit (INDEPENDENT_AMBULATORY_CARE_PROVIDER_SITE_OTHER): Payer: Commercial Managed Care - HMO

## 2013-08-29 DIAGNOSIS — D649 Anemia, unspecified: Secondary | ICD-10-CM

## 2013-08-29 LAB — HEMOGLOBIN: HEMOGLOBIN: 11.4 g/dL — AB (ref 12.0–15.0)

## 2013-09-02 ENCOUNTER — Encounter: Payer: Self-pay | Admitting: Internal Medicine

## 2013-09-04 NOTE — Telephone Encounter (Signed)
Unread mychart message mailed to patient 

## 2013-10-21 ENCOUNTER — Ambulatory Visit (INDEPENDENT_AMBULATORY_CARE_PROVIDER_SITE_OTHER): Payer: Commercial Managed Care - HMO | Admitting: Internal Medicine

## 2013-10-21 ENCOUNTER — Encounter: Payer: Self-pay | Admitting: Internal Medicine

## 2013-10-21 VITALS — BP 144/80 | HR 69 | Temp 98.5°F | Ht 61.2 in | Wt 201.2 lb

## 2013-10-21 DIAGNOSIS — E7800 Pure hypercholesterolemia, unspecified: Secondary | ICD-10-CM

## 2013-10-21 DIAGNOSIS — E039 Hypothyroidism, unspecified: Secondary | ICD-10-CM

## 2013-10-21 DIAGNOSIS — N760 Acute vaginitis: Secondary | ICD-10-CM

## 2013-10-21 DIAGNOSIS — D649 Anemia, unspecified: Secondary | ICD-10-CM

## 2013-10-21 DIAGNOSIS — M25519 Pain in unspecified shoulder: Secondary | ICD-10-CM

## 2013-10-21 DIAGNOSIS — Z139 Encounter for screening, unspecified: Secondary | ICD-10-CM

## 2013-10-21 DIAGNOSIS — M25511 Pain in right shoulder: Secondary | ICD-10-CM

## 2013-10-21 DIAGNOSIS — N993 Prolapse of vaginal vault after hysterectomy: Secondary | ICD-10-CM

## 2013-10-21 DIAGNOSIS — I10 Essential (primary) hypertension: Secondary | ICD-10-CM

## 2013-10-21 DIAGNOSIS — K219 Gastro-esophageal reflux disease without esophagitis: Secondary | ICD-10-CM

## 2013-10-21 LAB — CBC WITH DIFFERENTIAL/PLATELET
BASOS ABS: 0 10*3/uL (ref 0.0–0.1)
Basophils Relative: 0.8 % (ref 0.0–3.0)
Eosinophils Absolute: 0.2 10*3/uL (ref 0.0–0.7)
Eosinophils Relative: 2.9 % (ref 0.0–5.0)
HCT: 44.2 % (ref 36.0–46.0)
Hemoglobin: 14.2 g/dL (ref 12.0–15.0)
LYMPHS PCT: 30.4 % (ref 12.0–46.0)
Lymphs Abs: 2 10*3/uL (ref 0.7–4.0)
MCHC: 32.1 g/dL (ref 30.0–36.0)
MCV: 95.7 fl (ref 78.0–100.0)
MONO ABS: 0.6 10*3/uL (ref 0.1–1.0)
Monocytes Relative: 9 % (ref 3.0–12.0)
NEUTROS PCT: 56.9 % (ref 43.0–77.0)
Neutro Abs: 3.7 10*3/uL (ref 1.4–7.7)
PLATELETS: 204 10*3/uL (ref 150.0–400.0)
RBC: 4.62 Mil/uL (ref 3.87–5.11)
RDW: 19.8 % — ABNORMAL HIGH (ref 11.5–15.5)
WBC: 6.4 10*3/uL (ref 4.0–10.5)

## 2013-10-21 LAB — FERRITIN: Ferritin: 18.7 ng/mL (ref 10.0–291.0)

## 2013-10-21 NOTE — Assessment & Plan Note (Signed)
Wet prep obtained.  Await results prior to treatment.

## 2013-10-21 NOTE — Assessment & Plan Note (Signed)
On Levothyroxine.  Follow tsh.

## 2013-10-21 NOTE — Assessment & Plan Note (Signed)
Low cholesterol diet and exercise.  On lovastatin 40 mg now.  Follow lipid panel and liver function.

## 2013-10-21 NOTE — Assessment & Plan Note (Signed)
Resolved

## 2013-10-21 NOTE — Assessment & Plan Note (Signed)
Exam reveals prolapse (vaginal).  Pt confirmed - what she was feeling.  States not bothering her.  Will follow.

## 2013-10-21 NOTE — Assessment & Plan Note (Signed)
Is s/p EGD 6-7/13.  Saw Dr Tiffany Kocher.  S/p dilation.  On priloxec 20mg  q day now.  Symptoms controlled.  Follow.

## 2013-10-21 NOTE — Assessment & Plan Note (Signed)
Blood pressure overall under relatively good control.  Same medications.  Follow pressure.  Follow metabolic panel.

## 2013-10-21 NOTE — Assessment & Plan Note (Signed)
Just had EGD and colonoscopy.  Hgb on a recent check - 9.7.  Was found to be iron deficient.  Wanted to refer back to GI to complete work up for iron deficiency.  Discussed with her today.  Discussed the possibility of a capsule endoscopy.  She declines.  Taking her iron.  Last hgb improved.  Will recheck cbc/ferritin today.  Currently asymptomatic from a GI standpoint.

## 2013-10-21 NOTE — Progress Notes (Signed)
Pre visit review using our clinic review tool, if applicable. No additional management support is needed unless otherwise documented below in the visit note. 

## 2013-10-21 NOTE — Progress Notes (Signed)
Subjective:    Patient ID: Amanda Browning, female    DOB: 21-Jun-1945, 68 y.o.   MRN: [id number]  HPI 68 year old female with past history of hypertension, hypercholesterolemia, hypothyroidism and GERD who comes in today for a scheduled follow up.  Here to f/u regarding her blood pressure.   States her blood pressure is doing relatively well.  Some variation, but overall under reasonable control.  See her attached list for details.  Taking her meds regularly.  Saw Dr Tiffany Kocher.  Had EGD.  States - was "raw".  Is s/p dilatation. Was on 80mg  of Omeprazole. Now on 20mg  q day and this is controlling her symptoms.  Bowels stable.  Colonoscopy 11/19/12 revealed internal hemorrhoids.  Recommended recheck five years.   No nausea or vomiting.  No abdominal pain or cramping.   No chest congestion or wheezing.  No sob or cough.  Was having right shoulder pain.  Saw Barn[facility] Pall.  S/p injection.  Shoulder doing better.  Pain resolved.  Noticed a "lump" in her vaginal area.  No pain.  Some vaginal discharge.  No bleeding.     Past Medical History  Diagnosis Date  . GERD (gastroesophageal reflux disease)   . Hypertension   . Hypercholesterolemia   . History of chicken pox   . Migraines   . Colon polyps   . Hypothyroidism   . DDD (degenerative disc disease), cervical 02/05/13    C5-C6 and C6-C7    Current Outpatient Prescriptions on File Prior to Visit  Medication Sig Dispense Refill  . albuterol (PROVENTIL HFA;VENTOLIN HFA) 108 (90 BASE) MCG/ACT inhaler Inhale 2 puffs into the lungs every 6 (six) hours as needed for wheezing or shortness of breath.  1 Inhaler  0  . aspirin 81 MG tablet Take 81 mg by mouth daily.      . cetirizine (ZYRTEC) 10 MG tablet Take 1 tablet (10 mg total) by mouth daily.  90 tablet  3  . fluticasone (FLONASE) 50 MCG/ACT nasal spray Place 2 sprays into both nostrils daily.  16 g  1  . levothyroxine (SYNTHROID, LEVOTHROID) 25 MCG tablet Take 1 tablet (25 mcg total) by mouth daily before  breakfast.  90 tablet  3  . lisinopril-hydrochlorothiazide (PRINZIDE,ZESTORETIC) 20-25 MG per tablet Take 1 tablet by mouth daily.  90 tablet  3  . lovastatin (MEVACOR) 40 MG tablet Take 1 tablet (40 mg total) by mouth at bedtime.  90 tablet  3  . magnesium oxide (MAG-OX) 400 MG tablet Take 1 tablet (400 mg total) by mouth daily.  30 tablet  2  . Multiple Vitamin (MULTIVITAMIN) capsule Take 1 capsule by mouth daily.      Marland Kitchen omeprazole (PRILOSEC) 20 MG capsule Take 1 capsule (20 mg total) by mouth daily.  90 capsule  3   No current facility-administered medications on file prior to visit.    Review of Systems Patient denies any headache, lightheadedness or dizziness.  No significant sinus or allergy symptoms.  No chest pain, tightness or palpitations.  No increased shortness of breath.  No cough.  No chest congestion.   No nausea or vomiting.  Acid reflux controlled on omeprazole 20mg  q day.  No abdominal pain or cramping.  No bowel change, such as diarrhea, constipation, BRBPR or melana.  No urine change.   Shoulder pain resolved.  Vaginal symptoms as outlined.       Objective:   Physical Exam  Filed Vitals:   10/21/13 0852  BP: 144/80  Pulse: 69  Temp: 98.5 F (36.9 C)   Blood pressure recheck:  54/65  68 year old female in no acute distress.   HEENT:  Nares- clear.  Oropharynx - without lesions. NECK:  Supple.  Nontender.  No audible bruit.  HEART:  Appears to be regular. LUNGS:  No crackles or wheezing audible.  Respirations even and unlabored.  RADIAL PULSE:  Equal bilaterally.    BREASTS:  No nipple discharge or nipple retraction present.  Could not appreciate any distinct nodules or axillary adenopathy.  ABDOMEN:  Soft, nontender.  Bowel sounds present and normal.  No audible abdominal bruit.  GU:  Normal external genitalia.  Vaginal vault without lesions.  S/P hysterectomy.  Could not appreciate any adnexal masses or tenderness.  Did have  Vaginal/rectal prolapse. (she  pointed that this was what she was noticing).   EXTREMITIES:  No increased edema present.  DP pulses palpable and equal bilaterally.          Assessment & Plan:  CARDIOVASCULAR.  Had a faint murmur on exam.   She declines ever being told she had a murmur.  Currently asymptomatic.  Has wanted to hold on ECHO.  Follow.    PULMONARY.  Breathing stable.  Follow.    HEALTH MAINTENANCE.  Physical 08/13/13.  Mammogram 01/28/13 - Birads I.  Colonoscopy 11/19/12 - internal hemorrhoids.  Recommend f/u colonoscopy in five years.     I spent 25 minutes with the patient and more than 50% or the time was spent in consultation regarding the above.

## 2013-10-22 ENCOUNTER — Encounter: Payer: Self-pay | Admitting: Internal Medicine

## 2013-10-22 LAB — WET PREP BY MOLECULAR PROBE
CANDIDA SPECIES: NEGATIVE
Gardnerella vaginalis: NEGATIVE
TRICHOMONAS VAG: NEGATIVE

## 2013-10-24 NOTE — Telephone Encounter (Signed)
Unread mychart message mailed to patient with lab appt

## 2013-11-20 ENCOUNTER — Other Ambulatory Visit: Payer: Commercial Managed Care - HMO

## 2013-12-05 ENCOUNTER — Other Ambulatory Visit: Payer: Commercial Managed Care - HMO

## 2013-12-05 ENCOUNTER — Telehealth: Payer: Self-pay | Admitting: *Deleted

## 2013-12-05 ENCOUNTER — Other Ambulatory Visit (INDEPENDENT_AMBULATORY_CARE_PROVIDER_SITE_OTHER): Payer: Commercial Managed Care - HMO

## 2013-12-05 DIAGNOSIS — D649 Anemia, unspecified: Secondary | ICD-10-CM

## 2013-12-05 NOTE — Addendum Note (Signed)
Addended by: Alisa Graff on: 12/05/2013 03:37 PM   Modules accepted: Orders

## 2013-12-05 NOTE — Telephone Encounter (Signed)
Labs ordered.

## 2013-12-05 NOTE — Telephone Encounter (Signed)
Labs ordered. Thanks!

## 2013-12-05 NOTE — Telephone Encounter (Signed)
What labs and dx?  

## 2013-12-06 LAB — CBC WITH DIFFERENTIAL/PLATELET
BASOS PCT: 0.3 % (ref 0.0–3.0)
Basophils Absolute: 0 10*3/uL (ref 0.0–0.1)
EOS PCT: 4.3 % (ref 0.0–5.0)
Eosinophils Absolute: 0.3 10*3/uL (ref 0.0–0.7)
HCT: 43.3 % (ref 36.0–46.0)
HEMOGLOBIN: 14.2 g/dL (ref 12.0–15.0)
LYMPHS PCT: 33.1 % (ref 12.0–46.0)
Lymphs Abs: 1.9 10*3/uL (ref 0.7–4.0)
MCHC: 32.9 g/dL (ref 30.0–36.0)
MCV: 98.5 fl (ref 78.0–100.0)
MONOS PCT: 7.9 % (ref 3.0–12.0)
Monocytes Absolute: 0.5 10*3/uL (ref 0.1–1.0)
NEUTROS ABS: 3.2 10*3/uL (ref 1.4–7.7)
NEUTROS PCT: 54.4 % (ref 43.0–77.0)
Platelets: 225 10*3/uL (ref 150.0–400.0)
RBC: 4.39 Mil/uL (ref 3.87–5.11)
RDW: 13.6 % (ref 11.5–15.5)
WBC: 5.9 10*3/uL (ref 4.0–10.5)

## 2013-12-07 LAB — FERRITIN: Ferritin: 26.2 ng/mL (ref 10.0–291.0)

## 2013-12-08 ENCOUNTER — Encounter: Payer: Self-pay | Admitting: Internal Medicine

## 2013-12-11 NOTE — Telephone Encounter (Signed)
Unread mychart message mailed to patient 

## 2014-01-21 ENCOUNTER — Other Ambulatory Visit: Payer: Commercial Managed Care - HMO

## 2014-01-23 ENCOUNTER — Ambulatory Visit (INDEPENDENT_AMBULATORY_CARE_PROVIDER_SITE_OTHER): Payer: Commercial Managed Care - HMO | Admitting: Internal Medicine

## 2014-01-23 ENCOUNTER — Other Ambulatory Visit (INDEPENDENT_AMBULATORY_CARE_PROVIDER_SITE_OTHER): Payer: Commercial Managed Care - HMO

## 2014-01-23 ENCOUNTER — Encounter: Payer: Self-pay | Admitting: Internal Medicine

## 2014-01-23 VITALS — BP 130/80 | HR 67 | Temp 98.0°F | Ht 61.2 in | Wt 205.5 lb

## 2014-01-23 DIAGNOSIS — Z1239 Encounter for other screening for malignant neoplasm of breast: Secondary | ICD-10-CM

## 2014-01-23 DIAGNOSIS — E7800 Pure hypercholesterolemia, unspecified: Secondary | ICD-10-CM

## 2014-01-23 DIAGNOSIS — E2839 Other primary ovarian failure: Secondary | ICD-10-CM

## 2014-01-23 DIAGNOSIS — D649 Anemia, unspecified: Secondary | ICD-10-CM

## 2014-01-23 DIAGNOSIS — Z23 Encounter for immunization: Secondary | ICD-10-CM

## 2014-01-23 DIAGNOSIS — R252 Cramp and spasm: Secondary | ICD-10-CM

## 2014-01-23 DIAGNOSIS — K219 Gastro-esophageal reflux disease without esophagitis: Secondary | ICD-10-CM

## 2014-01-23 DIAGNOSIS — E039 Hypothyroidism, unspecified: Secondary | ICD-10-CM

## 2014-01-23 DIAGNOSIS — I10 Essential (primary) hypertension: Secondary | ICD-10-CM

## 2014-01-23 LAB — COMPREHENSIVE METABOLIC PANEL
ALK PHOS: 51 U/L (ref 39–117)
ALT: 19 U/L (ref 0–35)
AST: 21 U/L (ref 0–37)
Albumin: 3.7 g/dL (ref 3.5–5.2)
BUN: 15 mg/dL (ref 6–23)
CALCIUM: 9.8 mg/dL (ref 8.4–10.5)
CHLORIDE: 97 meq/L (ref 96–112)
CO2: 34 mEq/L — ABNORMAL HIGH (ref 19–32)
Creatinine, Ser: 0.8 mg/dL (ref 0.4–1.2)
GFR: 74.72 mL/min (ref >60.00)
GLUCOSE: 89 mg/dL (ref 70–99)
POTASSIUM: 3.8 meq/L (ref 3.5–5.1)
SODIUM: 137 meq/L (ref 135–145)
Total Bilirubin: 1.3 mg/dL — ABNORMAL HIGH (ref 0.2–1.2)
Total Protein: 7.2 g/dL (ref 6.0–8.3)

## 2014-01-23 LAB — LIPID PANEL
CHOL/HDL RATIO: 3
CHOLESTEROL: 225 mg/dL — AB (ref 0–200)
HDL: 82.2 mg/dL (ref >39.00)
LDL CALC: 126 mg/dL — AB (ref 0–99)
NonHDL: 142.8
TRIGLYCERIDES: 82 mg/dL (ref 0.0–149.0)
VLDL: 16.4 mg/dL (ref 0.0–40.0)

## 2014-01-23 NOTE — Progress Notes (Signed)
Subjective:    Patient ID: Amanda Browning, female    DOB: 1945/04/23, 68 y.o.   MRN: [id number]  HPI 68 year old female with past history of hypertension, hypercholesterolemia, hypothyroidism and GERD who comes in today for a scheduled follow up.  Taking her meds regularly.  Saw Dr Tiffany Kocher.  Had EGD.  States - was "raw".  Is s/p dilatation. Was on 80mg  of Omeprazole. Now on 20mg  q day and this is controlling her symptoms.  Bowels stable.  Colonoscopy 11/19/12 revealed internal hemorrhoids.  Recommended recheck five years.   No nausea or vomiting.  No abdominal pain or cramping.   No chest congestion or wheezing. Some sneezing, but no significant issues.  No sob or cough.  Was having right shoulder pain.  Saw [facility] Pall.  S/p injection.  Shoulder still doing well. Pain resolved.  Planning to see dermatology.      Past Medical History  Diagnosis Date  . GERD (gastroesophageal reflux disease)   . Hypertension   . Hypercholesterolemia   . History of chicken pox   . Migraines   . Colon polyps   . Hypothyroidism   . DDD (degenerative disc disease), cervical 02/05/13    C5-C6 and C6-C7    Current Outpatient Prescriptions on File Prior to Visit  Medication Sig Dispense Refill  . albuterol (PROVENTIL HFA;VENTOLIN HFA) 108 (90 BASE) MCG/ACT inhaler Inhale 2 puffs into the lungs every 6 (six) hours as needed for wheezing or shortness of breath.  1 Inhaler  0  . aspirin 81 MG tablet Take 81 mg by mouth daily.      . cetirizine (ZYRTEC) 10 MG tablet Take 1 tablet (10 mg total) by mouth daily.  90 tablet  3  . fluticasone (FLONASE) 50 MCG/ACT nasal spray Place 2 sprays into both nostrils daily.  16 g  1  . levothyroxine (SYNTHROID, LEVOTHROID) 25 MCG tablet Take 1 tablet (25 mcg total) by mouth daily before breakfast.  90 tablet  3  . lisinopril-hydrochlorothiazide (PRINZIDE,ZESTORETIC) 20-25 MG per tablet Take 1 tablet by mouth daily.  90 tablet  3  . lovastatin (MEVACOR) 40 MG tablet Take 1 tablet  (40 mg total) by mouth at bedtime.  90 tablet  3  . magnesium oxide (MAG-OX) 400 MG tablet Take 1 tablet (400 mg total) by mouth daily.  30 tablet  2  . Multiple Vitamin (MULTIVITAMIN) capsule Take 1 capsule by mouth daily.      Marland Kitchen omeprazole (PRILOSEC) 20 MG capsule Take 1 capsule (20 mg total) by mouth daily.  90 capsule  3   No current facility-administered medications on file prior to visit.    Review of Systems Patient denies any headache, lightheadedness or dizziness.  No significant sinus or allergy symptoms.  No chest pain, tightness or palpitations.  No increased shortness of breath.  No cough.  No chest congestion.   No nausea or vomiting.  Acid reflux controlled on omeprazole 20mg  q day.  No abdominal pain or cramping.  No bowel change, such as diarrhea, constipation, BRBPR or melana.  No urine change.   Shoulder pain resolved.  States overall she feels she is doing well.        Objective:   Physical Exam  Filed Vitals:   01/23/14 0852  BP: 130/80  Pulse: 67  Temp: 98 F (36.7 C)   Blood pressure recheck:  40/36  68 year old female in no acute distress.   HEENT:  Nares- clear.  Oropharynx -  without lesions. NECK:  Supple.  Nontender.  No audible bruit.  HEART:  Appears to be regular. LUNGS:  No crackles or wheezing audible.  Respirations even and unlabored.  RADIAL PULSE:  Equal bilaterally.  ABDOMEN:  Soft, nontender.  Bowel sounds present and normal.  No audible abdominal bruit.  EXTREMITIES:  No increased edema present.  DP pulses palpable and equal bilaterally.          Assessment & Plan:  CARDIOVASCULAR.  Had a faint murmur on exam.   She declines ever being told she had a murmur before seeing her.  Currently asymptomatic.  Has wanted to hold on ECHO.  Discussed with her today.  She wants to hold on ECHO.   PULMONARY.  Breathing stable.  Follow.    HEALTH MAINTENANCE.  Physical 08/13/13.  Mammogram 01/28/13 - Birads I. Schedule f/u mammogram today.  Colonoscopy  11/19/12 - internal hemorrhoids.  Recommend f/u colonoscopy in five years.       Need for prophylactic vaccination against Streptococcus pneumoniae (pneumococcus) - Pneumococcal conjugate vaccine 13-valent  Encounter for immunization Flu shot and prevnar given today.    Hypercholesterolemia - Comprehensive metabolic panel - Lipid panel Low cholesterol diet.    Breast cancer screening -MM DIGITAL SCREENING BILATERAL; Future  Estrogen deficiency Schedule bone density.  Has never had.    6. Anemia, unspecified anemia type Last hgb wnl.  She has not given blood recently. Iron stores improved.  Follow.   7. Gastroesophageal reflux disease, esophagitis presence not specified Had EGD 6-7/13.  S/p dilatation.  Symptoms controlled on omeprazole 20mg  q day.  Follow.   8. Essential hypertension Blood pressure doing ok.  Follow.  Check metabolic panel.   9. Hypothyroidism, unspecified hypothyroidism type Continue on thyroid replacement.  Follow tsh.    10. Cramp of both lower extremities Better on magnesium.  No problems now.  Will stop magnesium.

## 2014-01-23 NOTE — Progress Notes (Signed)
Pre visit review using our clinic review tool, if applicable. No additional management support is needed unless otherwise documented below in the visit note. 

## 2014-01-24 ENCOUNTER — Other Ambulatory Visit: Payer: Self-pay | Admitting: Internal Medicine

## 2014-01-24 ENCOUNTER — Encounter: Payer: Self-pay | Admitting: Internal Medicine

## 2014-01-24 NOTE — Progress Notes (Signed)
Orders placed for f/u labs.  

## 2014-02-07 ENCOUNTER — Other Ambulatory Visit (INDEPENDENT_AMBULATORY_CARE_PROVIDER_SITE_OTHER): Payer: Commercial Managed Care - HMO

## 2014-02-08 ENCOUNTER — Encounter: Payer: Self-pay | Admitting: Internal Medicine

## 2014-02-08 LAB — BILIRUBIN, TOTAL: Total Bilirubin: 0.7 mg/dL (ref 0.2–1.2)

## 2014-02-08 LAB — BILIRUBIN, DIRECT: Bilirubin, Direct: 0.1 mg/dL (ref 0.0–0.3)

## 2014-02-24 ENCOUNTER — Encounter: Payer: Self-pay | Admitting: *Deleted

## 2014-02-24 ENCOUNTER — Ambulatory Visit: Payer: Self-pay | Admitting: Internal Medicine

## 2014-02-24 LAB — HM MAMMOGRAPHY: HM Mammogram: NEGATIVE

## 2014-02-24 LAB — HM DEXA SCAN

## 2014-02-25 ENCOUNTER — Telehealth: Payer: Self-pay | Admitting: Internal Medicine

## 2014-02-25 NOTE — Telephone Encounter (Signed)
Will need to be evaluated.  I can see her on 02/27/14 at 12:00 or 03/04/14 at 12:00 - work in for this

## 2014-02-25 NOTE — Telephone Encounter (Signed)
Pt notified. States she has started taking ibuprofen and it is helping with the pain. Will continue with that for now and will call back of she feels she needs something else.

## 2014-02-25 NOTE — Telephone Encounter (Signed)
Please advise where you would like to work patient in.

## 2014-02-25 NOTE — Telephone Encounter (Signed)
Patient saw Amanda Browning some time ago about pain in her neck and she wanted to give the patient a muscle relaxed but the patient refused, but now her pain is back and she wants to know if she can have the muscle relaxer prescription now.

## 2014-03-16 ENCOUNTER — Telehealth: Payer: Self-pay | Admitting: Internal Medicine

## 2014-03-16 NOTE — Telephone Encounter (Signed)
Pt notified of bone density results.  Calcium, vitamin D and weight bearing exercise.

## 2014-03-19 ENCOUNTER — Encounter: Payer: Self-pay | Admitting: *Deleted

## 2014-03-25 ENCOUNTER — Encounter: Payer: Self-pay | Admitting: Internal Medicine

## 2014-03-26 ENCOUNTER — Encounter: Payer: Self-pay | Admitting: Internal Medicine

## 2014-05-27 DIAGNOSIS — M25511 Pain in right shoulder: Secondary | ICD-10-CM | POA: Diagnosis not present

## 2014-05-27 DIAGNOSIS — M7541 Impingement syndrome of right shoulder: Secondary | ICD-10-CM | POA: Diagnosis not present

## 2014-06-14 ENCOUNTER — Other Ambulatory Visit: Payer: Self-pay | Admitting: Internal Medicine

## 2014-06-22 DIAGNOSIS — S6992XA Unspecified injury of left wrist, hand and finger(s), initial encounter: Secondary | ICD-10-CM | POA: Diagnosis not present

## 2014-06-22 DIAGNOSIS — M25532 Pain in left wrist: Secondary | ICD-10-CM | POA: Diagnosis not present

## 2014-08-18 ENCOUNTER — Encounter: Payer: Commercial Managed Care - HMO | Admitting: Internal Medicine

## 2014-10-01 ENCOUNTER — Ambulatory Visit (INDEPENDENT_AMBULATORY_CARE_PROVIDER_SITE_OTHER): Payer: Commercial Managed Care - HMO | Admitting: Internal Medicine

## 2014-10-01 ENCOUNTER — Encounter: Payer: Self-pay | Admitting: Internal Medicine

## 2014-10-01 VITALS — BP 139/81 | HR 64 | Temp 98.1°F | Ht 62.0 in | Wt 211.0 lb

## 2014-10-01 DIAGNOSIS — R0602 Shortness of breath: Secondary | ICD-10-CM

## 2014-10-01 DIAGNOSIS — E7800 Pure hypercholesterolemia, unspecified: Secondary | ICD-10-CM

## 2014-10-01 DIAGNOSIS — I10 Essential (primary) hypertension: Secondary | ICD-10-CM

## 2014-10-01 DIAGNOSIS — R5383 Other fatigue: Secondary | ICD-10-CM

## 2014-10-01 DIAGNOSIS — R252 Cramp and spasm: Secondary | ICD-10-CM

## 2014-10-01 DIAGNOSIS — D649 Anemia, unspecified: Secondary | ICD-10-CM | POA: Diagnosis not present

## 2014-10-01 DIAGNOSIS — Z0000 Encounter for general adult medical examination without abnormal findings: Secondary | ICD-10-CM

## 2014-10-01 DIAGNOSIS — E039 Hypothyroidism, unspecified: Secondary | ICD-10-CM | POA: Diagnosis not present

## 2014-10-01 DIAGNOSIS — L989 Disorder of the skin and subcutaneous tissue, unspecified: Secondary | ICD-10-CM

## 2014-10-01 DIAGNOSIS — K219 Gastro-esophageal reflux disease without esophagitis: Secondary | ICD-10-CM

## 2014-10-01 LAB — CBC WITH DIFFERENTIAL/PLATELET
BASOS ABS: 0 10*3/uL (ref 0.0–0.1)
Basophils Relative: 0.7 % (ref 0.0–3.0)
EOS ABS: 0.2 10*3/uL (ref 0.0–0.7)
EOS PCT: 3 % (ref 0.0–5.0)
HCT: 35.7 % — ABNORMAL LOW (ref 36.0–46.0)
Hemoglobin: 11.6 g/dL — ABNORMAL LOW (ref 12.0–15.0)
LYMPHS ABS: 1.6 10*3/uL (ref 0.7–4.0)
Lymphocytes Relative: 25.6 % (ref 12.0–46.0)
MCHC: 32.5 g/dL (ref 30.0–36.0)
MCV: 88.8 fl (ref 78.0–100.0)
MONO ABS: 0.6 10*3/uL (ref 0.1–1.0)
Monocytes Relative: 9.9 % (ref 3.0–12.0)
NEUTROS PCT: 60.8 % (ref 43.0–77.0)
Neutro Abs: 3.8 10*3/uL (ref 1.4–7.7)
PLATELETS: 238 10*3/uL (ref 150.0–400.0)
RBC: 4.03 Mil/uL (ref 3.87–5.11)
RDW: 15.4 % (ref 11.5–15.5)
WBC: 6.3 10*3/uL (ref 4.0–10.5)

## 2014-10-01 LAB — FERRITIN: Ferritin: 9.6 ng/mL — ABNORMAL LOW (ref 10.0–291.0)

## 2014-10-01 LAB — HEPATIC FUNCTION PANEL
ALT: 19 U/L (ref 0–35)
AST: 21 U/L (ref 0–37)
Albumin: 4.2 g/dL (ref 3.5–5.2)
Alkaline Phosphatase: 65 U/L (ref 39–117)
BILIRUBIN TOTAL: 0.8 mg/dL (ref 0.2–1.2)
Bilirubin, Direct: 0.2 mg/dL (ref 0.0–0.3)
Total Protein: 6.4 g/dL (ref 6.0–8.3)

## 2014-10-01 LAB — BASIC METABOLIC PANEL
BUN: 10 mg/dL (ref 6–23)
CALCIUM: 9.5 mg/dL (ref 8.4–10.5)
CO2: 30 meq/L (ref 19–32)
Chloride: 98 mEq/L (ref 96–112)
Creatinine, Ser: 0.83 mg/dL (ref 0.40–1.20)
GFR: 72.5 mL/min (ref >60.00)
GLUCOSE: 86 mg/dL (ref 70–99)
Potassium: 4.2 mEq/L (ref 3.5–5.1)
Sodium: 134 mEq/L — ABNORMAL LOW (ref 135–145)

## 2014-10-01 LAB — LIPID PANEL
Cholesterol: 193 mg/dL (ref 0–200)
HDL: 80.6 mg/dL (ref >39.00)
LDL CALC: 97 mg/dL (ref 0–99)
NonHDL: 112.4
TRIGLYCERIDES: 75 mg/dL (ref 0.0–149.0)
Total CHOL/HDL Ratio: 2
VLDL: 15 mg/dL (ref 0.0–40.0)

## 2014-10-01 LAB — BILIRUBIN, TOTAL: BILIRUBIN TOTAL: 0.8 mg/dL (ref 0.2–1.2)

## 2014-10-01 LAB — TSH: TSH: 1.96 u[IU]/mL (ref 0.35–4.50)

## 2014-10-01 LAB — BILIRUBIN, DIRECT: Bilirubin, Direct: 0.2 mg/dL (ref 0.0–0.3)

## 2014-10-01 NOTE — Progress Notes (Signed)
Pre visit review using our clinic review tool, if applicable. No additional management support is needed unless otherwise documented below in the visit note. 

## 2014-10-01 NOTE — Progress Notes (Signed)
Patient ID: Amanda Browning, female   DOB: 19-Sep-1945, 69 y.o.   MRN: [id number]   Subjective:    Patient ID: Amanda Browning, female    DOB: 01/14/1946, 69 y.o.   MRN: [id number]  HPI  Patient here to follow up on her current medical issues as well as for a complete physical exam.  She fell in 05/2014.  Tripped over a step.  Had left hand fracture.  Wore a brace for 6 weeks.  Reports more fatigue.  Just started back on iron this week.  She is taking magnesium.  This is helping her cramps.  Has notice when she walks up stairs, she gets sob.   No pain.  No acid reflux reported.  Also reports that she has a knot over her right eye.  Persistent.  Discussed referral to dermatology.    Past Medical History  Diagnosis Date  . GERD (gastroesophageal reflux disease)   . Hypertension   . Hypercholesterolemia   . History of chicken pox   . Migraines   . Colon polyps   . Hypothyroidism   . DDD (degenerative disc disease), cervical 02/05/13    C5-C6 and C6-C7    Current Outpatient Prescriptions on File Prior to Visit  Medication Sig Dispense Refill  . albuterol (PROVENTIL HFA;VENTOLIN HFA) 108 (90 BASE) MCG/ACT inhaler Inhale 2 puffs into the lungs every 6 (six) hours as needed for wheezing or shortness of breath. 1 Inhaler 0  . aspirin 81 MG tablet Take 81 mg by mouth daily.    . cetirizine (ZYRTEC) 10 MG tablet Take 1 tablet (10 mg total) by mouth daily. 90 tablet 3  . fluticasone (FLONASE) 50 MCG/ACT nasal spray Place 2 sprays into both nostrils daily. 16 g 1  . levothyroxine (SYNTHROID, LEVOTHROID) 25 MCG tablet TAKE 1 TABLET EVERY DAY BEFORE BREAKFAST 90 tablet 3  . lisinopril-hydrochlorothiazide (PRINZIDE,ZESTORETIC) 20-25 MG per tablet Take 1 tablet by mouth daily. 90 tablet 3  . lovastatin (MEVACOR) 40 MG tablet Take 1 tablet (40 mg total) by mouth at bedtime. 90 tablet 3  . magnesium oxide (MAG-OX) 400 MG tablet Take 1 tablet (400 mg total) by mouth daily. 30 tablet 2  . Multiple  Vitamin (MULTIVITAMIN) capsule Take 1 capsule by mouth daily.    Marland Kitchen omeprazole (PRILOSEC) 20 MG capsule TAKE 1 CAPSULE EVERY DAY 90 capsule 3   No current facility-administered medications on file prior to visit.    Review of Systems  Constitutional: Positive for fatigue. Negative for appetite change and unexpected weight change.  HENT: Negative for congestion and sinus pressure.   Respiratory: Positive for shortness of breath (sob with exertion. ). Negative for cough and chest tightness.   Cardiovascular: Negative for chest pain, palpitations and leg swelling.  Gastrointestinal: Negative for nausea, vomiting, abdominal pain and diarrhea.  Skin: Negative for color change and rash.       Knot over right eyebrow.    Neurological: Negative for dizziness, light-headedness and headaches.  Psychiatric/Behavioral: Negative for dysphoric mood and agitation.       Objective:    Physical Exam  Constitutional: She is oriented to person, place, and time. She appears well-developed and well-nourished. No distress.  HENT:  Nose: Nose normal.  Mouth/Throat: Oropharynx is clear and moist.  Eyes: Right eye exhibits no discharge. Left eye exhibits no discharge. No scleral icterus.  Neck: Neck supple. No thyromegaly present.  Cardiovascular: Normal rate and regular rhythm.   Pulmonary/Chest: Breath sounds normal. No accessory muscle usage.  No tachypnea. No respiratory distress. She has no decreased breath sounds. She has no wheezes. She has no rhonchi. Right breast exhibits no inverted nipple, no mass, no nipple discharge and no tenderness (no axillary adenopathy). Left breast exhibits no inverted nipple, no mass, no nipple discharge and no tenderness (no axilarry adenopathy).  Abdominal: Soft. Bowel sounds are normal. There is no tenderness.  Musculoskeletal: She exhibits no edema or tenderness.  Lymphadenopathy:    She has no cervical adenopathy.  Neurological: She is alert and oriented to person,  place, and time.  Skin: Skin is warm. No rash noted.  Raised lesion over right eyebrow.    Psychiatric: She has a normal mood and affect. Her behavior is normal.    BP 139/81 mmHg  Pulse 64  Temp(Src) 98.1 F (36.7 C) (Oral)  Ht 5\' 2"  (1.575 m)  Wt 211 lb (95.709 kg)  BMI 38.58 kg/m2  SpO2 96% Wt Readings from Last 3 Encounters:  10/01/14 211 lb (95.709 kg)  01/23/14 205 lb 8 oz (93.214 kg)  10/21/13 201 lb 4 oz (91.286 kg)     Lab Results  Component Value Date   WBC 6.3 10/01/2014   HGB 11.6* 10/01/2014   HCT 35.7* 10/01/2014   PLT 238.0 10/01/2014   GLUCOSE 86 10/01/2014   CHOL 193 10/01/2014   TRIG 75.0 10/01/2014   HDL 80.60 10/01/2014   LDLDIRECT 131.0 03/19/2013   LDLCALC 97 10/01/2014   ALT 19 10/01/2014   AST 21 10/01/2014   NA 134* 10/01/2014   K 4.2 10/01/2014   CL 98 10/01/2014   CREATININE 0.83 10/01/2014   BUN 10 10/01/2014   CO2 30 10/01/2014   TSH 1.96 10/01/2014       Assessment & Plan:   Problem List Items Addressed This Visit    Anemia    Saw GI.  Had EGD and colonoscopy.  Follow cbc and ferritin.        Relevant Orders   CBC with Differential/Platelet (Completed)   Ferritin (Completed)   Fatigue    Probably multifactorial.  Will recheck cbc and iron stores.  Pursue cardiac w/up as outlined.  Follow.       GERD (gastroesophageal reflux disease)    S/p EGD.  Saw Dr Tiffany Kocher.  S/p dilation.  On prilosec.  Symptoms controlled.        Hypercholesterolemia    On lovastatin.  Low cholesterol diet and exercise.  Follow lipid panel and liver function tests.        Relevant Orders   Lipid panel (Completed)   Hepatic function panel (Completed)   Hypertension    Blood pressure has been doing well.  Same medication regimen.  Follow.  Follow metabolic panel.        Relevant Orders   Basic metabolic panel (Completed)   Hypothyroidism    On thyroid replacement.  Follow tsh.       Relevant Orders   TSH (Completed)   Leg cramps     Better on magnesium.        Skin lesion    Persistent know over right eyebrow.  Refer to dermatology.        Relevant Orders   Ambulatory referral to Dermatology   SOB (shortness of breath) on exertion    Sob with exertion.  EKG revealed SR with TWI III.  Discussed further w/up.  Has undiagnosed murmur.  Refer to cardiology for further w/up and evaluation.       Relevant Orders  Ambulatory referral to Cardiology    Other Visit Diagnoses    SOB (shortness of breath)    -  Primary    Relevant Orders    EKG 12-Lead (Completed)    Ambulatory referral to Cardiology    Hyperbilirubinemia        Routine medical exam        Relevant Orders    Ambulatory referral to Optometry      I spent 25 minutes with the patient and more than 50% of the time was spent in consultation regarding the above.     Einar Pheasant, MD

## 2014-10-02 ENCOUNTER — Other Ambulatory Visit: Payer: Self-pay | Admitting: Internal Medicine

## 2014-10-02 ENCOUNTER — Encounter: Payer: Self-pay | Admitting: *Deleted

## 2014-10-02 DIAGNOSIS — D509 Iron deficiency anemia, unspecified: Secondary | ICD-10-CM

## 2014-10-02 DIAGNOSIS — E871 Hypo-osmolality and hyponatremia: Secondary | ICD-10-CM

## 2014-10-02 NOTE — Progress Notes (Signed)
Order placed for f/u labs.  

## 2014-10-07 ENCOUNTER — Encounter: Payer: Self-pay | Admitting: Internal Medicine

## 2014-10-07 DIAGNOSIS — L989 Disorder of the skin and subcutaneous tissue, unspecified: Secondary | ICD-10-CM | POA: Insufficient documentation

## 2014-10-07 DIAGNOSIS — R0602 Shortness of breath: Secondary | ICD-10-CM | POA: Insufficient documentation

## 2014-10-07 DIAGNOSIS — R5383 Other fatigue: Secondary | ICD-10-CM | POA: Insufficient documentation

## 2014-10-07 NOTE — Assessment & Plan Note (Signed)
Better on magnesium.

## 2014-10-07 NOTE — Assessment & Plan Note (Signed)
Sob with exertion.  EKG revealed SR with TWI III.  Discussed further w/up.  Has undiagnosed murmur.  Refer to cardiology for further w/up and evaluation.

## 2014-10-07 NOTE — Assessment & Plan Note (Signed)
On thyroid replacement.  Follow tsh.  

## 2014-10-07 NOTE — Assessment & Plan Note (Signed)
Probably multifactorial.  Will recheck cbc and iron stores.  Pursue cardiac w/up as outlined.  Follow.

## 2014-10-07 NOTE — Assessment & Plan Note (Signed)
S/p EGD.  Saw Dr Tiffany Kocher.  S/p dilation.  On prilosec.  Symptoms controlled.

## 2014-10-07 NOTE — Assessment & Plan Note (Signed)
Persistent know over right eyebrow.  Refer to dermatology.

## 2014-10-07 NOTE — Assessment & Plan Note (Signed)
On lovastatin.  Low cholesterol diet and exercise.  Follow lipid panel and liver function tests.   

## 2014-10-07 NOTE — Assessment & Plan Note (Signed)
Blood pressure has been doing well.  Same medication regimen.  Follow.  Follow metabolic panel.

## 2014-10-07 NOTE — Assessment & Plan Note (Signed)
Saw GI.  Had EGD and colonoscopy.  Follow cbc and ferritin.

## 2014-10-14 DIAGNOSIS — R0789 Other chest pain: Secondary | ICD-10-CM | POA: Diagnosis not present

## 2014-10-14 DIAGNOSIS — R0602 Shortness of breath: Secondary | ICD-10-CM | POA: Diagnosis not present

## 2014-10-15 ENCOUNTER — Other Ambulatory Visit: Payer: Self-pay | Admitting: Internal Medicine

## 2014-10-23 ENCOUNTER — Other Ambulatory Visit: Payer: Commercial Managed Care - HMO

## 2014-10-23 ENCOUNTER — Other Ambulatory Visit: Payer: Self-pay | Admitting: Internal Medicine

## 2014-10-23 ENCOUNTER — Other Ambulatory Visit (INDEPENDENT_AMBULATORY_CARE_PROVIDER_SITE_OTHER): Payer: Commercial Managed Care - HMO

## 2014-10-23 DIAGNOSIS — E871 Hypo-osmolality and hyponatremia: Secondary | ICD-10-CM

## 2014-10-23 DIAGNOSIS — D509 Iron deficiency anemia, unspecified: Secondary | ICD-10-CM

## 2014-10-23 LAB — CBC WITH DIFFERENTIAL/PLATELET
Basophils Absolute: 0 10*3/uL (ref 0.0–0.1)
Basophils Relative: 0.8 % (ref 0.0–3.0)
EOS ABS: 0.3 10*3/uL (ref 0.0–0.7)
EOS PCT: 4.8 % (ref 0.0–5.0)
HCT: 36.5 % (ref 36.0–46.0)
Hemoglobin: 12.1 g/dL (ref 12.0–15.0)
LYMPHS PCT: 31.6 % (ref 12.0–46.0)
Lymphs Abs: 1.8 10*3/uL (ref 0.7–4.0)
MCHC: 33.1 g/dL (ref 30.0–36.0)
MCV: 89.5 fl (ref 78.0–100.0)
Monocytes Absolute: 0.7 10*3/uL (ref 0.1–1.0)
Monocytes Relative: 12.5 % — ABNORMAL HIGH (ref 3.0–12.0)
NEUTROS PCT: 50.3 % (ref 43.0–77.0)
Neutro Abs: 2.9 10*3/uL (ref 1.4–7.7)
PLATELETS: 237 10*3/uL (ref 150.0–400.0)
RBC: 4.08 Mil/uL (ref 3.87–5.11)
RDW: 18.7 % — AB (ref 11.5–15.5)
WBC: 5.7 10*3/uL (ref 4.0–10.5)

## 2014-10-23 LAB — FERRITIN: Ferritin: 15.3 ng/mL (ref 10.0–291.0)

## 2014-10-23 LAB — SODIUM: SODIUM: 131 meq/L — AB (ref 135–145)

## 2014-10-23 NOTE — Progress Notes (Signed)
Order placed for f/u sodium.  ?

## 2014-10-30 ENCOUNTER — Other Ambulatory Visit (INDEPENDENT_AMBULATORY_CARE_PROVIDER_SITE_OTHER): Payer: Commercial Managed Care - HMO

## 2014-10-30 ENCOUNTER — Encounter: Payer: Self-pay | Admitting: Internal Medicine

## 2014-10-30 DIAGNOSIS — E871 Hypo-osmolality and hyponatremia: Secondary | ICD-10-CM

## 2014-10-30 LAB — SODIUM: SODIUM: 135 meq/L (ref 135–145)

## 2014-11-03 DIAGNOSIS — H5203 Hypermetropia, bilateral: Secondary | ICD-10-CM | POA: Diagnosis not present

## 2014-11-03 DIAGNOSIS — H5210 Myopia, unspecified eye: Secondary | ICD-10-CM | POA: Diagnosis not present

## 2014-11-03 DIAGNOSIS — I10 Essential (primary) hypertension: Secondary | ICD-10-CM | POA: Diagnosis not present

## 2014-11-03 DIAGNOSIS — H2510 Age-related nuclear cataract, unspecified eye: Secondary | ICD-10-CM | POA: Diagnosis not present

## 2014-12-05 DIAGNOSIS — R0602 Shortness of breath: Secondary | ICD-10-CM | POA: Diagnosis not present

## 2014-12-05 DIAGNOSIS — R0789 Other chest pain: Secondary | ICD-10-CM | POA: Diagnosis not present

## 2014-12-09 DIAGNOSIS — L814 Other melanin hyperpigmentation: Secondary | ICD-10-CM | POA: Diagnosis not present

## 2014-12-09 DIAGNOSIS — L570 Actinic keratosis: Secondary | ICD-10-CM | POA: Diagnosis not present

## 2014-12-09 DIAGNOSIS — L700 Acne vulgaris: Secondary | ICD-10-CM | POA: Diagnosis not present

## 2014-12-09 DIAGNOSIS — D239 Other benign neoplasm of skin, unspecified: Secondary | ICD-10-CM | POA: Diagnosis not present

## 2014-12-09 DIAGNOSIS — L821 Other seborrheic keratosis: Secondary | ICD-10-CM | POA: Diagnosis not present

## 2014-12-10 ENCOUNTER — Ambulatory Visit (INDEPENDENT_AMBULATORY_CARE_PROVIDER_SITE_OTHER): Payer: Commercial Managed Care - HMO | Admitting: Internal Medicine

## 2014-12-10 ENCOUNTER — Encounter: Payer: Self-pay | Admitting: Internal Medicine

## 2014-12-10 VITALS — BP 115/79 | HR 84 | Temp 98.1°F | Ht 62.0 in | Wt 210.2 lb

## 2014-12-10 DIAGNOSIS — Z1239 Encounter for other screening for malignant neoplasm of breast: Secondary | ICD-10-CM | POA: Diagnosis not present

## 2014-12-10 DIAGNOSIS — K219 Gastro-esophageal reflux disease without esophagitis: Secondary | ICD-10-CM | POA: Diagnosis not present

## 2014-12-10 DIAGNOSIS — Z23 Encounter for immunization: Secondary | ICD-10-CM

## 2014-12-10 DIAGNOSIS — I10 Essential (primary) hypertension: Secondary | ICD-10-CM

## 2014-12-10 DIAGNOSIS — D649 Anemia, unspecified: Secondary | ICD-10-CM

## 2014-12-10 DIAGNOSIS — E7800 Pure hypercholesterolemia, unspecified: Secondary | ICD-10-CM

## 2014-12-10 DIAGNOSIS — R0602 Shortness of breath: Secondary | ICD-10-CM

## 2014-12-10 DIAGNOSIS — E039 Hypothyroidism, unspecified: Secondary | ICD-10-CM

## 2014-12-10 DIAGNOSIS — G479 Sleep disorder, unspecified: Secondary | ICD-10-CM

## 2014-12-10 MED ORDER — TRAZODONE HCL 50 MG PO TABS: 25.0000 mg | ORAL_TABLET | Freq: Every evening | ORAL | Status: DC | PRN

## 2014-12-10 NOTE — Progress Notes (Signed)
Patient ID: Amanda Browning, female   DOB: Jan 27, 1946, 69 y.o.   MRN: [id number]   Subjective:    Patient ID: Amanda Browning, female    DOB: 12-14-1945, 69 y.o.   MRN: [id number]  HPI  Patient here for a scheduled follow up.  She just saw cardiology.  Had stress test.  Does not have results yet.  Was informed to f/u with cardiology in 6 months.  No chest pain.  Breathing is stable.  No increased sob.  No acid reflux.  Eating and drinking well.  Bowels stable.  Has issues with sleeping.  Has trouble staying asleep.  Discussed treatment options.     Past Medical History  Diagnosis Date  . GERD (gastroesophageal reflux disease)   . Hypertension   . Hypercholesterolemia   . History of chicken pox   . Migraines   . Colon polyps   . Hypothyroidism   . DDD (degenerative disc disease), cervical 02/05/13    C5-C6 and C6-C7   Past Surgical History  Procedure Laterality Date  . Breast biopsy  2012  . Vaginal hysterectomy  1994    ovaries not removed   Family History  Problem Relation Age of Onset  . Heart disease Mother   . Stroke Mother   . Hypertension Mother   . Heart disease Father     enlarged heart  . Stroke Father   . Breast cancer Neg Hx   . Colon cancer Neg Hx   . Ovarian cancer Paternal Aunt    Social History   Social History  . Marital Status: Divorced    Spouse Name: N/A  . Number of Children: N/A  . Years of Education: N/A   Social History Main Topics  . Smoking status: Never Smoker   . Smokeless tobacco: Never Used  . Alcohol Use: No  . Drug Use: No  . Sexual Activity: Not Asked   Other Topics Concern  . None   Social History Narrative    Outpatient Encounter Prescriptions as of 12/10/2014  Medication Sig  . aspirin 81 MG tablet Take 81 mg by mouth daily.  . cetirizine (ZYRTEC) 10 MG tablet Take 1 tablet (10 mg total) by mouth daily.  . ferrous sulfate 325 (65 FE) MG tablet Take 325 mg by mouth daily.  Marland Kitchen levothyroxine (SYNTHROID, LEVOTHROID) 25  MCG tablet TAKE 1 TABLET EVERY DAY BEFORE BREAKFAST  . lisinopril-hydrochlorothiazide (PRINZIDE,ZESTORETIC) 20-25 MG per tablet TAKE 1 TABLET EVERY DAY  . lovastatin (MEVACOR) 40 MG tablet TAKE 1 TABLET AT BEDTIME  . magnesium oxide (MAG-OX) 400 MG tablet Take 1 tablet (400 mg total) by mouth daily.  . Multiple Vitamin (MULTIVITAMIN) capsule Take 1 capsule by mouth daily.  Marland Kitchen omeprazole (PRILOSEC) 20 MG capsule TAKE 1 CAPSULE EVERY DAY  . [DISCONTINUED] albuterol (PROVENTIL HFA;VENTOLIN HFA) 108 (90 BASE) MCG/ACT inhaler Inhale 2 puffs into the lungs every 6 (six) hours as needed for wheezing or shortness of breath.  . [DISCONTINUED] fluticasone (FLONASE) 50 MCG/ACT nasal spray Place 2 sprays into both nostrils daily.  . traZODone (DESYREL) 50 MG tablet Take 0.5-1 tablets (25-50 mg total) by mouth at bedtime as needed for sleep.   No facility-administered encounter medications on file as of 12/10/2014.    Review of Systems  Constitutional: Negative for appetite change and unexpected weight change.  HENT: Negative for congestion and sinus pressure.   Eyes: Negative for discharge and visual disturbance.  Respiratory: Negative for cough, chest tightness and shortness of breath.  Cardiovascular: Negative for chest pain, palpitations and leg swelling.  Gastrointestinal: Negative for nausea, abdominal pain and diarrhea.  Genitourinary: Negative for dysuria and difficulty urinating.  Skin: Negative for color change and rash.  Neurological: Negative for dizziness, light-headedness and headaches.  Hematological: Negative for adenopathy. Does not bruise/bleed easily.  Psychiatric/Behavioral: Negative for dysphoric mood and agitation.       Objective:    Physical Exam  Constitutional: She appears well-developed and well-nourished. No distress.  HENT:  Nose: Nose normal.  Mouth/Throat: Oropharynx is clear and moist.  Eyes: Conjunctivae are normal. Right eye exhibits no discharge. Left eye  exhibits no discharge.  Neck: Neck supple. No thyromegaly present.  Cardiovascular: Normal rate and regular rhythm.   Pulmonary/Chest: Breath sounds normal. No respiratory distress. She has no wheezes.  Abdominal: Soft. Bowel sounds are normal. There is no tenderness.  Musculoskeletal: She exhibits no edema or tenderness.  Lymphadenopathy:    She has no cervical adenopathy.  Skin: No rash noted. No erythema.  Psychiatric: She has a normal mood and affect. Her behavior is normal.    BP 115/79 mmHg  Pulse 84  Temp(Src) 98.1 F (36.7 C) (Oral)  Ht 5\' 2"  (1.575 m)  Wt 210 lb 4 oz (95.369 kg)  BMI 38.45 kg/m2  SpO2 97% Wt Readings from Last 3 Encounters:  12/10/14 210 lb 4 oz (95.369 kg)  10/01/14 211 lb (95.709 kg)  01/23/14 205 lb 8 oz (93.214 kg)     Lab Results  Component Value Date   WBC 5.7 10/23/2014   HGB 12.1 10/23/2014   HCT 36.5 10/23/2014   PLT 237.0 10/23/2014   GLUCOSE 86 10/01/2014   CHOL 193 10/01/2014   TRIG 75.0 10/01/2014   HDL 80.60 10/01/2014   LDLDIRECT 131.0 03/19/2013   LDLCALC 97 10/01/2014   ALT 19 10/01/2014   AST 21 10/01/2014   NA 135 10/30/2014   K 4.2 10/01/2014   CL 98 10/01/2014   CREATININE 0.83 10/01/2014   BUN 10 10/01/2014   CO2 30 10/01/2014   TSH 1.96 10/01/2014       Assessment & Plan:   Problem List Items Addressed This Visit    Anemia    Saw GI.  Had EGD and colonoscopy.  Follow cbc and ferritin.       Relevant Medications   ferrous sulfate 325 (65 FE) MG tablet   Difficulty sleeping    Problems sleeping.  Problems staying asleep.  Discussed treatment options.  Treat with trazodone as directed.  Follow.        GERD (gastroesophageal reflux disease)    S/p EGD.  Saw Dr Tiffany Kocher.  S/p dilatation.  On prilosec.  Symptoms controlled.       Hypercholesterolemia    Low cholesterol diet and exercise.  Follow lipid panel and liver function tests.  On lovastatin.        Relevant Orders   Hepatic function panel   Lipid  panel   Hypertension    Blood pressure under good control.  Continue same medication regimen.  Follow pressures.  Follow metabolic panel.        Relevant Orders   Basic metabolic panel   Hypothyroidism    On thyroid replacement.  Follow tsh.       SOB (shortness of breath) on exertion    Saw cardiology.  Had stress tests.  Do not have results.  Will plan to discuss with cardiology.         Other Visit Diagnoses  Screening breast examination    -  Primary    Relevant Orders    MM DIGITAL SCREENING BILATERAL    Encounter for immunization            Einar Pheasant, MD

## 2014-12-10 NOTE — Progress Notes (Signed)
Pre-visit discussion using our clinic review tool. No additional management support is needed unless otherwise documented below in the visit note.  

## 2014-12-14 ENCOUNTER — Encounter: Payer: Self-pay | Admitting: Internal Medicine

## 2014-12-14 DIAGNOSIS — G479 Sleep disorder, unspecified: Secondary | ICD-10-CM | POA: Insufficient documentation

## 2014-12-14 NOTE — Assessment & Plan Note (Signed)
Blood pressure under good control.  Continue same medication regimen.  Follow pressures.  Follow metabolic panel.   

## 2014-12-14 NOTE — Assessment & Plan Note (Signed)
Low cholesterol diet and exercise.  Follow lipid panel and liver function tests.  On lovastatin.   

## 2014-12-14 NOTE — Assessment & Plan Note (Signed)
Saw GI.  Had EGD and colonoscopy.  Follow cbc and ferritin.

## 2014-12-14 NOTE — Assessment & Plan Note (Signed)
Saw cardiology.  Had stress tests.  Do not have results.  Will plan to discuss with cardiology.

## 2014-12-14 NOTE — Assessment & Plan Note (Signed)
Problems sleeping.  Problems staying asleep.  Discussed treatment options.  Treat with trazodone as directed.  Follow.

## 2014-12-14 NOTE — Assessment & Plan Note (Signed)
On thyroid replacement.  Follow tsh.  

## 2014-12-14 NOTE — Assessment & Plan Note (Signed)
S/p EGD.  Saw Dr Tiffany Kocher.  S/p dilatation.  On prilosec.  Symptoms controlled.

## 2015-03-18 ENCOUNTER — Other Ambulatory Visit: Payer: Self-pay | Admitting: Internal Medicine

## 2015-03-18 ENCOUNTER — Ambulatory Visit
Admission: RE | Admit: 2015-03-18 | Discharge: 2015-03-18 | Disposition: A | Discharge status: Home / Self Care | Payer: Commercial Managed Care - HMO | Source: Ambulatory Visit | Attending: Internal Medicine | Admitting: Internal Medicine

## 2015-03-18 DIAGNOSIS — Z1231 Encounter for screening mammogram for malignant neoplasm of breast: Secondary | ICD-10-CM | POA: Diagnosis not present

## 2015-03-18 DIAGNOSIS — Z1239 Encounter for other screening for malignant neoplasm of breast: Secondary | ICD-10-CM

## 2015-03-19 ENCOUNTER — Encounter: Payer: Self-pay | Admitting: Internal Medicine

## 2015-03-19 DIAGNOSIS — Z0000 Encounter for general adult medical examination without abnormal findings: Secondary | ICD-10-CM | POA: Insufficient documentation

## 2015-04-14 ENCOUNTER — Other Ambulatory Visit: Payer: Self-pay | Admitting: Internal Medicine

## 2015-04-15 ENCOUNTER — Ambulatory Visit (INDEPENDENT_AMBULATORY_CARE_PROVIDER_SITE_OTHER): Payer: Commercial Managed Care - HMO | Admitting: Internal Medicine

## 2015-04-15 ENCOUNTER — Encounter: Payer: Self-pay | Admitting: Internal Medicine

## 2015-04-15 VITALS — BP 120/70 | HR 60 | Temp 98.4°F | Ht 62.0 in | Wt 203.1 lb

## 2015-04-15 DIAGNOSIS — Z658 Other specified problems related to psychosocial circumstances: Secondary | ICD-10-CM

## 2015-04-15 DIAGNOSIS — K219 Gastro-esophageal reflux disease without esophagitis: Secondary | ICD-10-CM | POA: Diagnosis not present

## 2015-04-15 DIAGNOSIS — I10 Essential (primary) hypertension: Secondary | ICD-10-CM | POA: Diagnosis not present

## 2015-04-15 DIAGNOSIS — E7800 Pure hypercholesterolemia, unspecified: Secondary | ICD-10-CM

## 2015-04-15 DIAGNOSIS — E039 Hypothyroidism, unspecified: Secondary | ICD-10-CM

## 2015-04-15 DIAGNOSIS — D649 Anemia, unspecified: Secondary | ICD-10-CM

## 2015-04-15 DIAGNOSIS — F439 Reaction to severe stress, unspecified: Secondary | ICD-10-CM

## 2015-04-15 LAB — HEPATIC FUNCTION PANEL
ALBUMIN: 3.9 g/dL (ref 3.5–5.2)
ALT: 18 U/L (ref 0–35)
AST: 16 U/L (ref 0–37)
Alkaline Phosphatase: 61 U/L (ref 39–117)
Bilirubin, Direct: 0.1 mg/dL (ref 0.0–0.3)
Total Bilirubin: 0.8 mg/dL (ref 0.2–1.2)
Total Protein: 6.7 g/dL (ref 6.0–8.3)

## 2015-04-15 LAB — BASIC METABOLIC PANEL
BUN: 16 mg/dL (ref 6–23)
CO2: 29 meq/L (ref 19–32)
Calcium: 9.9 mg/dL (ref 8.4–10.5)
Chloride: 101 mEq/L (ref 96–112)
Creatinine, Ser: 0.76 mg/dL (ref 0.40–1.20)
GFR: 80.13 mL/min (ref >60.00)
GLUCOSE: 91 mg/dL (ref 70–99)
POTASSIUM: 3.8 meq/L (ref 3.5–5.1)
SODIUM: 139 meq/L (ref 135–145)

## 2015-04-15 LAB — LIPID PANEL
CHOLESTEROL: 206 mg/dL — AB (ref 0–200)
HDL: 75.7 mg/dL (ref >39.00)
LDL Cholesterol: 111 mg/dL — ABNORMAL HIGH (ref 0–99)
NonHDL: 130.69
Total CHOL/HDL Ratio: 3
Triglycerides: 100 mg/dL (ref 0.0–149.0)
VLDL: 20 mg/dL (ref 0.0–40.0)

## 2015-04-15 MED ORDER — TRAZODONE HCL 50 MG PO TABS: 25.0000 mg | ORAL_TABLET | Freq: Every evening | ORAL | Status: DC | PRN

## 2015-04-15 NOTE — Progress Notes (Signed)
Pre visit review using our clinic review tool, if applicable. No additional management support is needed unless otherwise documented below in the visit note. 

## 2015-04-15 NOTE — Progress Notes (Signed)
Patient ID: Amanda Browning, female   DOB: 12-24-1945, 69 y.o.   MRN: [id number]   Subjective:    Patient ID: Amanda Browning, female    DOB: 03/06/1946, 69 y.o.   MRN: [id number]  HPI  Patient with past history of hypercholesterolemia, GERD, hypothyroidism and hypertension.  She comes in today to follow up on these issues.  She reports increased stress.  Her son was killed.  Discussed this with her today.  She has good support.  Does not feel she needs anything more at this point.  She tries to stay active.  No cardiac symptoms with increased activity or exertion.  No sob.  No acid reflux reported.  No abdominal pain or cramping. Bowels stable.  Has lost weight.     Past Medical History  Diagnosis Date  . GERD (gastroesophageal reflux disease)   . Hypertension   . Hypercholesterolemia   . History of chicken pox   . Migraines   . Colon polyps   . Hypothyroidism   . DDD (degenerative disc disease), cervical 02/05/13    C5-C6 and C6-C7   Past Surgical History  Procedure Laterality Date  . Vaginal hysterectomy  1994    ovaries not removed  . Breast biopsy  2012    stereotactic   Family History  Problem Relation Age of Onset  . Heart disease Mother   . Stroke Mother   . Hypertension Mother   . Heart disease Father     enlarged heart  . Stroke Father   . Colon cancer Neg Hx   . Ovarian cancer Paternal Aunt   . Breast cancer Sister 90   Social History   Social History  . Marital Status: Divorced    Spouse Name: N/A  . Number of Children: N/A  . Years of Education: N/A   Social History Main Topics  . Smoking status: Never Smoker   . Smokeless tobacco: Never Used  . Alcohol Use: No  . Drug Use: No  . Sexual Activity: Not Asked   Other Topics Concern  . None   Social History Narrative    Outpatient Encounter Prescriptions as of 04/15/2015  Medication Sig  . aspirin 81 MG tablet Take 81 mg by mouth daily.  . cetirizine (ZYRTEC) 10 MG tablet Take 1 tablet (10  mg total) by mouth daily.  . ferrous sulfate 325 (65 FE) MG tablet Take 325 mg by mouth daily.  Marland Kitchen lisinopril-hydrochlorothiazide (PRINZIDE,ZESTORETIC) 20-25 MG per tablet TAKE 1 TABLET EVERY DAY  . lovastatin (MEVACOR) 40 MG tablet TAKE 1 TABLET AT BEDTIME  . magnesium oxide (MAG-OX) 400 MG tablet Take 1 tablet (400 mg total) by mouth daily.  . Multiple Vitamin (MULTIVITAMIN) capsule Take 1 capsule by mouth daily.  . traZODone (DESYREL) 50 MG tablet Take 0.5-1 tablets (25-50 mg total) by mouth at bedtime as needed for sleep.  . [DISCONTINUED] levothyroxine (SYNTHROID, LEVOTHROID) 25 MCG tablet TAKE 1 TABLET EVERY DAY BEFORE BREAKFAST  . [DISCONTINUED] omeprazole (PRILOSEC) 20 MG capsule TAKE 1 CAPSULE EVERY DAY  . [DISCONTINUED] traZODone (DESYREL) 50 MG tablet Take 0.5-1 tablets (25-50 mg total) by mouth at bedtime as needed for sleep.   No facility-administered encounter medications on file as of 04/15/2015.    Review of Systems  Constitutional:       Has lost weight.  Is eating.    HENT: Negative for congestion and sinus pressure.   Respiratory: Negative for cough, chest tightness and shortness of breath.   Cardiovascular: Negative  for chest pain and palpitations.  Gastrointestinal: Negative for nausea, vomiting, abdominal pain and diarrhea.  Musculoskeletal: Negative for myalgias and joint swelling.  Skin: Negative for color change and rash.  Neurological: Negative for dizziness and headaches.  Psychiatric/Behavioral:       Increased stress as outlined.  Dealing with her son's death.  Has good support.         Objective:     Blood pressure rechecked by me:  138/82  Physical Exam  Constitutional: She appears well-developed and well-nourished. No distress.  HENT:  Nose: Nose normal.  Mouth/Throat: Oropharynx is clear and moist.  Neck: Neck supple. No thyromegaly present.  Cardiovascular: Normal rate and regular rhythm.   Pulmonary/Chest: Breath sounds normal. No respiratory  distress. She has no wheezes.  Abdominal: Soft. Bowel sounds are normal. There is no tenderness.  Musculoskeletal: She exhibits no edema or tenderness.  Lymphadenopathy:    She has no cervical adenopathy.  Skin: No rash noted. No erythema.  Psychiatric: She has a normal mood and affect. Her behavior is normal.    BP 120/70 mmHg  Pulse 60  Temp(Src) 98.4 F (36.9 C) (Oral)  Ht 5\' 2"  (1.575 m)  Wt 203 lb 2 oz (92.137 kg)  BMI 37.14 kg/m2  SpO2 95% Wt Readings from Last 3 Encounters:  04/15/15 203 lb 2 oz (92.137 kg)  12/10/14 210 lb 4 oz (95.369 kg)  10/01/14 211 lb (95.709 kg)     Lab Results  Component Value Date   WBC 5.7 10/23/2014   HGB 12.1 10/23/2014   HCT 36.5 10/23/2014   PLT 237.0 10/23/2014   GLUCOSE 91 04/15/2015   CHOL 206* 04/15/2015   TRIG 100.0 04/15/2015   HDL 75.70 04/15/2015   LDLDIRECT 131.0 03/19/2013   LDLCALC 111* 04/15/2015   ALT 18 04/15/2015   AST 16 04/15/2015   NA 139 04/15/2015   K 3.8 04/15/2015   CL 101 04/15/2015   CREATININE 0.76 04/15/2015   BUN 16 04/15/2015   CO2 29 04/15/2015   TSH 1.96 10/01/2014    Mm Screening Breast Tomo Bilateral  03/18/2015  CLINICAL DATA:  Screening. EXAM: DIGITAL SCREENING BILATERAL MAMMOGRAM WITH 3D TOMO WITH CAD COMPARISON:  Previous exam(s). ACR Breast Density Category b: There are scattered areas of fibroglandular density. FINDINGS: There are no findings suspicious for malignancy. Images were processed with CAD. IMPRESSION: No mammographic evidence of malignancy. A result letter of this screening mammogram will be mailed directly to the patient. RECOMMENDATION: Screening mammogram in one year. (Code:SM-B-01Y) BI-RADS CATEGORY  1: Negative. Electronically Signed   By: Lovey Newcomer M.D.   On: 03/18/2015 12:34       Assessment & Plan:   Problem List Items Addressed This Visit    Anemia    Has seen GI.  Had EGD and colonoscopy.  Follow [facility].       GERD (gastroesophageal reflux disease) - Primary     S/p EGD.  S/p dilatation.  On omeprazole.  Symptoms controlled.        Hypercholesterolemia    Low cholesterol diet and exercise.  On lovastatin.  Follow lipid panel and liver function tests.        Hypertension    Blood pressure under good control.  Continue same medication regimen.  Follow pressures.  Follow metabolic panel.        Hypothyroidism    On thyroid replacement.  Follow tsh.        Stress    Increased stress as  outlined.  Has good support.  Does not feel she needs any further intervention at this point.  Follow closely.            Einar Pheasant, MD

## 2015-04-16 ENCOUNTER — Encounter: Payer: Self-pay | Admitting: Internal Medicine

## 2015-04-20 ENCOUNTER — Encounter: Payer: Self-pay | Admitting: Internal Medicine

## 2015-04-20 DIAGNOSIS — F439 Reaction to severe stress, unspecified: Secondary | ICD-10-CM | POA: Insufficient documentation

## 2015-04-20 NOTE — Assessment & Plan Note (Signed)
S/p EGD.  S/p dilatation.  On omeprazole.  Symptoms controlled.

## 2015-04-20 NOTE — Assessment & Plan Note (Signed)
Has seen GI.  Had EGD and colonoscopy.  Follow [facility].

## 2015-04-20 NOTE — Assessment & Plan Note (Signed)
Low cholesterol diet and exercise.  On lovastatin.  Follow lipid panel and liver function tests.   

## 2015-04-20 NOTE — Assessment & Plan Note (Signed)
Blood pressure under good control.  Continue same medication regimen.  Follow pressures.  Follow metabolic panel.   

## 2015-04-20 NOTE — Assessment & Plan Note (Signed)
On thyroid replacement.  Follow tsh.  

## 2015-04-20 NOTE — Assessment & Plan Note (Signed)
Increased stress as outlined.  Has good support.  Does not feel she needs any further intervention at this point.  Follow closely.

## 2015-06-26 ENCOUNTER — Encounter: Payer: Self-pay | Admitting: Internal Medicine

## 2015-06-26 ENCOUNTER — Ambulatory Visit (INDEPENDENT_AMBULATORY_CARE_PROVIDER_SITE_OTHER): Payer: Commercial Managed Care - HMO | Admitting: Internal Medicine

## 2015-06-26 VITALS — BP 112/80 | HR 58 | Temp 97.9°F | Resp 18 | Ht 62.0 in | Wt 202.5 lb

## 2015-06-26 DIAGNOSIS — F439 Reaction to severe stress, unspecified: Secondary | ICD-10-CM

## 2015-06-26 DIAGNOSIS — Z658 Other specified problems related to psychosocial circumstances: Secondary | ICD-10-CM | POA: Diagnosis not present

## 2015-06-26 DIAGNOSIS — K219 Gastro-esophageal reflux disease without esophagitis: Secondary | ICD-10-CM | POA: Diagnosis not present

## 2015-06-26 DIAGNOSIS — I10 Essential (primary) hypertension: Secondary | ICD-10-CM

## 2015-06-26 DIAGNOSIS — E039 Hypothyroidism, unspecified: Secondary | ICD-10-CM

## 2015-06-26 DIAGNOSIS — M25511 Pain in right shoulder: Secondary | ICD-10-CM

## 2015-06-26 DIAGNOSIS — E7800 Pure hypercholesterolemia, unspecified: Secondary | ICD-10-CM

## 2015-06-26 DIAGNOSIS — D649 Anemia, unspecified: Secondary | ICD-10-CM

## 2015-06-26 NOTE — Progress Notes (Signed)
Pre-visit discussion using our clinic review tool. No additional management support is needed unless otherwise documented below in the visit note.  

## 2015-06-26 NOTE — Progress Notes (Signed)
Patient ID: Amanda Browning, female   DOB: 05/20/1945, 70 y.o.   MRN: [id number]   Subjective:    Patient ID: Amanda Browning, female    DOB: 1945/07/10, 70 y.o.   MRN: [id number]  HPI  Patient with past history of hypercholesterolemia, GERD, hypothyroidism and hypercholesterolemia.  She comes in today for a scheduled follow up.  She reports she is doing relatively well.  Still trying to cope with her sons death.  States she has good support.  Does not feel she needs any further intervention at this time.  Has been having some right shoulder pain for the last two weeks.  No fall.  Has some pain with full extension.  Breathing stable.  Trying to stay active.  Discussed diet and exercise.  No abdominal pain or cramping.  Bowels stable.     Past Medical History  Diagnosis Date  . GERD (gastroesophageal reflux disease)   . Hypertension   . Hypercholesterolemia   . History of chicken pox   . Migraines   . Colon polyps   . Hypothyroidism   . DDD (degenerative disc disease), cervical 02/05/13    C5-C6 and C6-C7   Past Surgical History  Procedure Laterality Date  . Vaginal hysterectomy  1994    ovaries not removed  . Breast biopsy  2012    stereotactic   Family History  Problem Relation Age of Onset  . Heart disease Mother   . Stroke Mother   . Hypertension Mother   . Heart disease Father     enlarged heart  . Stroke Father   . Colon cancer Neg Hx   . Ovarian cancer Paternal Aunt   . Breast cancer Sister 20   Social History   Social History  . Marital Status: Divorced    Spouse Name: N/A  . Number of Children: N/A  . Years of Education: N/A   Social History Main Topics  . Smoking status: Never Smoker   . Smokeless tobacco: Never Used  . Alcohol Use: No  . Drug Use: No  . Sexual Activity: Not Asked   Other Topics Concern  . None   Social History Narrative    Outpatient Encounter Prescriptions as of 06/26/2015  Medication Sig  . aspirin 81 MG tablet Take 81 mg  by mouth daily.  . cetirizine (ZYRTEC) 10 MG tablet Take 1 tablet (10 mg total) by mouth daily.  . ferrous sulfate 325 (65 FE) MG tablet Take 325 mg by mouth daily.  Marland Kitchen levothyroxine (SYNTHROID, LEVOTHROID) 25 MCG tablet TAKE 1 TABLET EVERY DAY BEFORE BREAKFAST  . lisinopril-hydrochlorothiazide (PRINZIDE,ZESTORETIC) 20-25 MG per tablet TAKE 1 TABLET EVERY DAY  . lovastatin (MEVACOR) 40 MG tablet TAKE 1 TABLET AT BEDTIME  . magnesium oxide (MAG-OX) 400 MG tablet Take 1 tablet (400 mg total) by mouth daily.  . Multiple Vitamin (MULTIVITAMIN) capsule Take 1 capsule by mouth daily.  Marland Kitchen omeprazole (PRILOSEC) 20 MG capsule TAKE 1 CAPSULE EVERY DAY  . traZODone (DESYREL) 50 MG tablet Take 0.5-1 tablets (25-50 mg total) by mouth at bedtime as needed for sleep.   No facility-administered encounter medications on file as of 06/26/2015.    Review of Systems  Constitutional: Negative for appetite change and unexpected weight change.  HENT: Negative for congestion and sinus pressure.   Respiratory: Negative for cough, chest tightness and shortness of breath.   Cardiovascular: Negative for chest pain, palpitations and leg swelling.  Gastrointestinal: Negative for nausea, vomiting, abdominal pain and diarrhea.  Genitourinary: Negative for dysuria and difficulty urinating.  Musculoskeletal: Negative for back pain.       Right shoulder pain as outlined.    Skin: Negative for color change and rash.  Neurological: Negative for dizziness, light-headedness and headaches.  Psychiatric/Behavioral: Negative for dysphoric mood and agitation.       Increased stress as outlined.         Objective:    Physical Exam  Constitutional: She appears well-developed and well-nourished. No distress.  HENT:  Nose: Nose normal.  Mouth/Throat: Oropharynx is clear and moist.  Eyes: Conjunctivae are normal. Right eye exhibits no discharge. Left eye exhibits no discharge.  Neck: Neck supple. No thyromegaly present.    Cardiovascular: Normal rate and regular rhythm.   Pulmonary/Chest: Breath sounds normal. No respiratory distress. She has no wheezes.  Abdominal: Soft. Bowel sounds are normal. There is no tenderness.  Musculoskeletal: She exhibits no edema or tenderness.  Increased discomfort in her right shoulder with attempts at full extension and with abduction at or above 90 degrees.    Lymphadenopathy:    She has no cervical adenopathy.  Skin: No rash noted. No erythema.  Psychiatric: She has a normal mood and affect. Her behavior is normal.    BP 112/80 mmHg  Pulse 58  Temp(Src) 97.9 F (36.6 C) (Oral)  Resp 18  Ht 5\' 2"  (1.575 m)  Wt 202 lb 8 oz (91.853 kg)  BMI 37.03 kg/m2  SpO2 94% Wt Readings from Last 3 Encounters:  06/26/15 202 lb 8 oz (91.853 kg)  04/15/15 203 lb 2 oz (92.137 kg)  12/10/14 210 lb 4 oz (95.369 kg)     Lab Results  Component Value Date   WBC 5.7 10/23/2014   HGB 12.1 10/23/2014   HCT 36.5 10/23/2014   PLT 237.0 10/23/2014   GLUCOSE 91 04/15/2015   CHOL 206* 04/15/2015   TRIG 100.0 04/15/2015   HDL 75.70 04/15/2015   LDLDIRECT 131.0 03/19/2013   LDLCALC 111* 04/15/2015   ALT 18 04/15/2015   AST 16 04/15/2015   NA 139 04/15/2015   K 3.8 04/15/2015   CL 101 04/15/2015   CREATININE 0.76 04/15/2015   BUN 16 04/15/2015   CO2 29 04/15/2015   TSH 1.96 10/01/2014        Assessment & Plan:   Problem List Items Addressed This Visit    Anemia    Hemoglobin wnl 10/23/14.        GERD (gastroesophageal reflux disease)    S/p EGD and dilatation.  On omeprazole.  Symptoms controlled.  Follow.        Hypercholesterolemia    On lovastatin.  Low cholesterol diet and exercise.  LDL 04/15/15 - 111.  Follow lipid panel and liver function tests.        Hypertension - Primary    Blood pressure under good control.  Continue same medication regimen.  Follow pressures.  Follow metabolic panel.        Hypothyroidism    On thyroid replacement.  Follow tsh.         Right shoulder pain    Has had reoccurrence.  Tylenol as directed.  Desires no further evaluation at this time.  Follow.        Stress    Increased stress as outlined.  Discussed with her today.  She has good support.  Desires no further intervention at this time.  Follow.            Einar Pheasant, MD

## 2015-06-28 ENCOUNTER — Encounter: Payer: Self-pay | Admitting: Internal Medicine

## 2015-06-28 NOTE — Assessment & Plan Note (Signed)
Has had reoccurrence.  Tylenol as directed.  Desires no further evaluation at this time.  Follow.

## 2015-06-28 NOTE — Assessment & Plan Note (Signed)
On lovastatin.  Low cholesterol diet and exercise.  LDL 04/15/15 - 111.  Follow lipid panel and liver function tests.

## 2015-06-28 NOTE — Assessment & Plan Note (Signed)
On thyroid replacement.  Follow tsh.  

## 2015-06-28 NOTE — Assessment & Plan Note (Signed)
Increased stress as outlined.  Discussed with her today.  She has good support.  Desires no further intervention at this time.  Follow.

## 2015-06-28 NOTE — Assessment & Plan Note (Signed)
Hemoglobin wnl 10/23/14.

## 2015-06-28 NOTE — Assessment & Plan Note (Signed)
Blood pressure under good control.  Continue same medication regimen.  Follow pressures.  Follow metabolic panel.   

## 2015-06-28 NOTE — Assessment & Plan Note (Signed)
S/p EGD and dilatation.  On omeprazole.  Symptoms controlled.  Follow.

## 2015-07-16 ENCOUNTER — Ambulatory Visit: Payer: Commercial Managed Care - HMO

## 2015-07-17 ENCOUNTER — Ambulatory Visit (INDEPENDENT_AMBULATORY_CARE_PROVIDER_SITE_OTHER): Payer: Commercial Managed Care - HMO

## 2015-07-17 VITALS — BP 110/78 | HR 60 | Temp 98.1°F | Resp 14 | Ht 61.4 in | Wt 201.4 lb

## 2015-07-17 DIAGNOSIS — Z0000 Encounter for general adult medical examination without abnormal findings: Secondary | ICD-10-CM

## 2015-07-17 NOTE — Patient Instructions (Addendum)
  [name] , Thank you for taking time to come for your Medicare Wellness Visit. I appreciate your ongoing commitment to your health goals. Please review the following plan we discussed and let me know if I can assist you in the future.   It was very nice to meet you, have a blessed day!  Follow up with Dr. Nicki Reaper as needed.     This is a list of the screening recommended for you and due dates:  Health Maintenance  Topic Date Due  .  Hepatitis C: One time screening is recommended by Center for Disease Control  (CDC) for  adults born from [birth year] through 1965.   05/25/1945  . Tetanus Vaccine  12/31/1964  . Flu Shot  11/17/2015  . Mammogram  03/17/2016  . Colon Cancer Screening  11/20/2022  . DEXA scan (bone density measurement)  Completed  . Shingles Vaccine  Addressed  . Pneumonia vaccines  Completed

## 2015-07-17 NOTE — Progress Notes (Signed)
Subjective:   Amanda Browning is a 70 y.o. female who presents for an Initial Medicare Annual Wellness Visit.  Review of Systems    No ROS.  Medicare Wellness Visit.  Cardiac Risk Factors include: advanced age (>67men, >63 women);hypertension     Objective:    Today's Vitals   07/17/15 1020  BP: 110/78  Pulse: 60  Temp: 98.1 F (36.7 C)  TempSrc: Oral  Resp: 14  Height: 5' 1.4" (1.56 m)  Weight: 201 lb 6.4 oz (91.354 kg)  SpO2: 97%   Body mass index is 37.54 kg/(m^2).   Current Medications (verified) Outpatient Encounter Prescriptions as of 07/17/2015  Medication Sig  . aspirin 81 MG tablet Take 81 mg by mouth daily.  . cetirizine (ZYRTEC) 10 MG tablet Take 1 tablet (10 mg total) by mouth daily.  . ferrous sulfate 325 (65 FE) MG tablet Take 325 mg by mouth daily.  Marland Kitchen levothyroxine (SYNTHROID, LEVOTHROID) 25 MCG tablet TAKE 1 TABLET EVERY DAY BEFORE BREAKFAST  . lisinopril-hydrochlorothiazide (PRINZIDE,ZESTORETIC) 20-25 MG per tablet TAKE 1 TABLET EVERY DAY  . lovastatin (MEVACOR) 40 MG tablet TAKE 1 TABLET AT BEDTIME  . magnesium oxide (MAG-OX) 400 MG tablet Take 1 tablet (400 mg total) by mouth daily.  . Multiple Vitamin (MULTIVITAMIN) capsule Take 1 capsule by mouth daily.  Marland Kitchen omeprazole (PRILOSEC) 20 MG capsule TAKE 1 CAPSULE EVERY DAY  . traZODone (DESYREL) 50 MG tablet Take 0.5-1 tablets (25-50 mg total) by mouth at bedtime as needed for sleep.   No facility-administered encounter medications on file as of 07/17/2015.    Allergies (verified) Codeine   History: Past Medical History  Diagnosis Date  . GERD (gastroesophageal reflux disease)   . Hypertension   . Hypercholesterolemia   . History of chicken pox   . Migraines   . Colon polyps   . Hypothyroidism   . DDD (degenerative disc disease), cervical 02/05/13    C5-C6 and C6-C7   Past Surgical History  Procedure Laterality Date  . Vaginal hysterectomy  1994    ovaries not removed  . Breast biopsy   2012    stereotactic   Family History  Problem Relation Age of Onset  . Heart disease Mother   . Stroke Mother   . Hypertension Mother   . Heart disease Father     enlarged heart  . Stroke Father   . Colon cancer Neg Hx   . Ovarian cancer Paternal Aunt   . Breast cancer Sister 79  . Cancer Sister     Breast Cancer   Social History   Occupational History  . Not on file.   Social History Main Topics  . Smoking status: Never Smoker   . Smokeless tobacco: Never Used  . Alcohol Use: No  . Drug Use: No  . Sexual Activity: No    Tobacco Counseling Counseling given: Not Answered   Activities of Daily Living In your present state of health, do you have any difficulty performing the following activities: 07/17/2015  Hearing? N  Vision? N  Difficulty concentrating or making decisions? N  Walking or climbing stairs? N  Dressing or bathing? N  Doing errands, shopping? N  Preparing Food and eating ? N  Using the Toilet? N  In the past six months, have you accidently leaked urine? N  Do you have problems with loss of bowel control? N  Managing your Medications? N  Managing your Finances? N  Housekeeping or managing your Housekeeping? N  Immunizations and Health Maintenance Immunization History  Administered Date(s) Administered  . Influenza Split 04/03/2012  . Influenza,inj,Quad PF,36+ Mos 01/15/2013, 01/23/2014, 12/10/2014  . Pneumococcal Conjugate-13 01/23/2014  . Pneumococcal Polysaccharide-23 01/15/2013   Health Maintenance Due  Topic Date Due  . Hepatitis C Screening  1945-08-22  . TETANUS/TDAP  12/31/1964    Patient Care Team: Einar Pheasant, MD as PCP - General (Internal Medicine)  Indicate any recent Medical Services you may have received from other than [facility] providers in the past year (date may be approximate).     Assessment:   This is a routine wellness examination for Amanda Browning.  The goal of the wellness visit is to assist the patient how to close  the gaps in care and create a preventative care plan for the patient.   Osteoporosis risk reviewed.  Medications reviewed; taking without issues or barriers.  Safety issues reviewed; smoke detectors in the home. Firearms locked in a safe area in the home. Wears seatbelts when driving or riding with others. No violence in the home.  No identified risk were noted; The patient was oriented x 3; appropriate in dress and manner and no objective failures at ADL's or IADL's.   TDAP vaccine postponed for follow up with insurance.   Hepatitis C Screening postponed for follow up with PCP.  Patient Concerns:  R shoulder pain has resolved; feels much better since taking the Arthritis Tylenol.  Hearing/Vision screen Hearing Screening Comments: Passes the whisper test Vision Screening Comments: Followed by Dr. Matilde Sprang Wears glasses Annual visits   Dietary issues and exercise activities discussed: Current Exercise Habits: Structured exercise class, Type of exercise: calisthenics;stretching, Time (Minutes): 30, Frequency (Times/Week): 4, Weekly Exercise (Minutes/Week): 120, Intensity: Moderate  Goals    . Healthy Lifestyle     Stay hydrated and continue drinking plenty of fluids.  Increase water intake by 1 bottle (2 cups). Low carb foods. Lean meats, fruits and vegetables. Stay active and maintain exercise regiment at the gym.      Depression Screen PHQ 2/9 Scores 07/17/2015 10/01/2014 08/13/2013 07/17/2012 03/19/2012  PHQ - 2 Score 0 0 0 0 0    Fall Risk Fall Risk  07/17/2015 10/01/2014 08/13/2013 07/17/2012 03/19/2012  Falls in the past year? No No No No No    Cognitive Function: MMSE - Mini Mental State Exam 07/17/2015  Orientation to time 5  Orientation to Place 5  Registration 3  Attention/ Calculation 5  Recall 3  Language- name 2 objects 2  Language- repeat 1  Language- follow 3 step command 3  Language- read & follow direction 1  Write a sentence 1  Copy design 1  Total score  30    Screening Tests Health Maintenance  Topic Date Due  . Hepatitis C Screening  [birth year]  . TETANUS/TDAP  12/31/1964  . INFLUENZA VACCINE  11/17/2015  . MAMMOGRAM  03/17/2016  . COLONOSCOPY  11/20/2022  . DEXA SCAN  Completed  . ZOSTAVAX  Addressed  . PNA vac Low Risk Adult  Completed      Plan:   End of life planning; Advance aging; Advanced directives discussed. Educational material provided to help her start the conversation with family.  Requested copy of completed HCPOA/Living Will requested. Time spent discussing Advanced Directives is 23 minutes.   During the course of the visit, Amanda Browning was educated and counseled about the following appropriate screening and preventive services:   Vaccines to include Pneumoccal, Influenza, Hepatitis B, Td, Zostavax, HCV  Electrocardiogram  Cardiovascular disease  screening  Colorectal cancer screening  Bone density screening  Diabetes screening  Glaucoma screening  Mammography/PAP  Nutrition counseling  Smoking cessation counseling  Patient Instructions (the written plan) were given to the patient.    Amanda Biles, LPN   [id number]    Reviewed above information.  Agree with plan.    Dr Nicki Reaper

## 2015-08-20 ENCOUNTER — Other Ambulatory Visit: Payer: Self-pay | Admitting: Internal Medicine

## 2015-08-31 ENCOUNTER — Other Ambulatory Visit: Payer: Self-pay | Admitting: Internal Medicine

## 2015-10-02 ENCOUNTER — Ambulatory Visit (INDEPENDENT_AMBULATORY_CARE_PROVIDER_SITE_OTHER): Payer: Commercial Managed Care - HMO | Admitting: Internal Medicine

## 2015-10-02 ENCOUNTER — Ambulatory Visit (INDEPENDENT_AMBULATORY_CARE_PROVIDER_SITE_OTHER): Payer: Commercial Managed Care - HMO

## 2015-10-02 ENCOUNTER — Encounter: Payer: Self-pay | Admitting: Internal Medicine

## 2015-10-02 VITALS — BP 104/69 | HR 64 | Temp 98.1°F | Resp 18 | Ht 62.0 in | Wt 204.5 lb

## 2015-10-02 DIAGNOSIS — Z658 Other specified problems related to psychosocial circumstances: Secondary | ICD-10-CM

## 2015-10-02 DIAGNOSIS — M25561 Pain in right knee: Secondary | ICD-10-CM

## 2015-10-02 DIAGNOSIS — E7800 Pure hypercholesterolemia, unspecified: Secondary | ICD-10-CM

## 2015-10-02 DIAGNOSIS — F439 Reaction to severe stress, unspecified: Secondary | ICD-10-CM

## 2015-10-02 DIAGNOSIS — D649 Anemia, unspecified: Secondary | ICD-10-CM | POA: Diagnosis not present

## 2015-10-02 DIAGNOSIS — Z0000 Encounter for general adult medical examination without abnormal findings: Secondary | ICD-10-CM | POA: Diagnosis not present

## 2015-10-02 DIAGNOSIS — E039 Hypothyroidism, unspecified: Secondary | ICD-10-CM

## 2015-10-02 DIAGNOSIS — I10 Essential (primary) hypertension: Secondary | ICD-10-CM | POA: Diagnosis not present

## 2015-10-02 DIAGNOSIS — M179 Osteoarthritis of knee, unspecified: Secondary | ICD-10-CM | POA: Diagnosis not present

## 2015-10-02 DIAGNOSIS — K219 Gastro-esophageal reflux disease without esophagitis: Secondary | ICD-10-CM

## 2015-10-02 DIAGNOSIS — Z1239 Encounter for other screening for malignant neoplasm of breast: Secondary | ICD-10-CM

## 2015-10-02 LAB — CBC WITH DIFFERENTIAL/PLATELET
BASOS ABS: 0 10*3/uL (ref 0.0–0.1)
BASOS PCT: 0.7 % (ref 0.0–3.0)
Eosinophils Absolute: 0.2 10*3/uL (ref 0.0–0.7)
Eosinophils Relative: 3.6 % (ref 0.0–5.0)
HEMATOCRIT: 38.6 % (ref 36.0–46.0)
Hemoglobin: 13.3 g/dL (ref 12.0–15.0)
LYMPHS ABS: 1.7 10*3/uL (ref 0.7–4.0)
LYMPHS PCT: 32.9 % (ref 12.0–46.0)
MCHC: 34.4 g/dL (ref 30.0–36.0)
MCV: 97 fl (ref 78.0–100.0)
MONOS PCT: 12 % (ref 3.0–12.0)
Monocytes Absolute: 0.6 10*3/uL (ref 0.1–1.0)
NEUTROS ABS: 2.7 10*3/uL (ref 1.4–7.7)
NEUTROS PCT: 50.8 % (ref 43.0–77.0)
PLATELETS: 186 10*3/uL (ref 150.0–400.0)
RBC: 3.98 Mil/uL (ref 3.87–5.11)
RDW: 12.7 % (ref 11.5–15.5)
WBC: 5.3 10*3/uL (ref 4.0–10.5)

## 2015-10-02 LAB — LIPID PANEL
CHOL/HDL RATIO: 2
Cholesterol: 184 mg/dL (ref 0–200)
HDL: 79.4 mg/dL (ref >39.00)
LDL CALC: 91 mg/dL (ref 0–99)
NONHDL: 104.2
Triglycerides: 64 mg/dL (ref 0.0–149.0)
VLDL: 12.8 mg/dL (ref 0.0–40.0)

## 2015-10-02 LAB — HEPATIC FUNCTION PANEL
ALK PHOS: 53 U/L (ref 39–117)
ALT: 21 U/L (ref 0–35)
AST: 20 U/L (ref 0–37)
Albumin: 4 g/dL (ref 3.5–5.2)
BILIRUBIN DIRECT: 0.2 mg/dL (ref 0.0–0.3)
BILIRUBIN TOTAL: 0.9 mg/dL (ref 0.2–1.2)
TOTAL PROTEIN: 6.7 g/dL (ref 6.0–8.3)

## 2015-10-02 LAB — BASIC METABOLIC PANEL
BUN: 12 mg/dL (ref 6–23)
CHLORIDE: 98 meq/L (ref 96–112)
CO2: 32 meq/L (ref 19–32)
CREATININE: 0.92 mg/dL (ref 0.40–1.20)
Calcium: 9.9 mg/dL (ref 8.4–10.5)
GFR: 64.19 mL/min (ref >60.00)
GLUCOSE: 94 mg/dL (ref 70–99)
POTASSIUM: 4.1 meq/L (ref 3.5–5.1)
Sodium: 135 mEq/L (ref 135–145)

## 2015-10-02 LAB — TSH: TSH: 3.08 u[IU]/mL (ref 0.35–4.50)

## 2015-10-02 NOTE — Progress Notes (Signed)
Pre-visit discussion using our clinic review tool. No additional management support is needed unless otherwise documented below in the visit note.  

## 2015-10-02 NOTE — Assessment & Plan Note (Addendum)
Physical today 10/02/15.  Mammogram 03/18/15 - Birads I.  Colonoscopy 11/2012 - internal hemorrhoids otherwise normal.  Recommended f/u colonoscopy in five years.

## 2015-10-02 NOTE — Progress Notes (Signed)
Patient ID: APRYLL HALIM, female   DOB: 1945-04-24, 70 y.o.   MRN: [id number]   Subjective:    Patient ID: ORLINE BLACKLEY, female    DOB: 1945-05-02, 70 y.o.   MRN: [id number]  HPI  Patient here for a physical exam.  She states she is doing relatively well.  Still with increased stress.  See last note.  Feels doing well with this.  Has good support.  Tries to stay active.  Does reports right knee pain.  Worsened over the last 2-3 months.  Taking tylenol and alleve.  No chest pain.  No sob.  No acid relfux reported.  No abdominal pain or cramping.  Bowels stable.     Past Medical History  Diagnosis Date  . GERD (gastroesophageal reflux disease)   . Hypertension   . Hypercholesterolemia   . History of chicken pox   . Migraines   . Colon polyps   . Hypothyroidism   . DDD (degenerative disc disease), cervical 02/05/13    C5-C6 and C6-C7   Past Surgical History  Procedure Laterality Date  . Vaginal hysterectomy  1994    ovaries not removed  . Breast biopsy  2012    stereotactic   Family History  Problem Relation Age of Onset  . Heart disease Mother   . Stroke Mother   . Hypertension Mother   . Heart disease Father     enlarged heart  . Stroke Father   . Colon cancer Neg Hx   . Ovarian cancer Paternal Aunt   . Breast cancer Sister 105  . Cancer Sister     Breast Cancer   Social History   Social History  . Marital Status: Divorced    Spouse Name: N/A  . Number of Children: N/A  . Years of Education: N/A   Social History Main Topics  . Smoking status: Never Smoker   . Smokeless tobacco: Never Used  . Alcohol Use: No  . Drug Use: No  . Sexual Activity: No   Other Topics Concern  . None   Social History Narrative    Outpatient Encounter Prescriptions as of 10/02/2015  Medication Sig  . aspirin 81 MG tablet Take 81 mg by mouth daily.  . cetirizine (ZYRTEC) 10 MG tablet Take 1 tablet (10 mg total) by mouth daily.  . ferrous sulfate 325 (65 FE) MG tablet  Take 325 mg by mouth daily.  Marland Kitchen levothyroxine (SYNTHROID, LEVOTHROID) 25 MCG tablet TAKE 1 TABLET EVERY DAY BEFORE BREAKFAST  . lisinopril-hydrochlorothiazide (PRINZIDE,ZESTORETIC) 20-25 MG tablet TAKE 1 TABLET EVERY DAY  . lovastatin (MEVACOR) 40 MG tablet TAKE 1 TABLET AT BEDTIME  . magnesium oxide (MAG-OX) 400 MG tablet Take 1 tablet (400 mg total) by mouth daily.  . Multiple Vitamin (MULTIVITAMIN) capsule Take 1 capsule by mouth daily.  Marland Kitchen omeprazole (PRILOSEC) 20 MG capsule TAKE 1 CAPSULE EVERY DAY  . traZODone (DESYREL) 50 MG tablet TAKE 1/2 TO 1 TABLET AT BEDTIME AS NEEDED FOR SLEEP   No facility-administered encounter medications on file as of 10/02/2015.    Review of Systems  Constitutional: Negative for appetite change and unexpected weight change.  HENT: Negative for congestion and sinus pressure.   Eyes: Negative for pain and visual disturbance.  Respiratory: Negative for cough, chest tightness and shortness of breath.   Cardiovascular: Negative for chest pain, palpitations and leg swelling.  Gastrointestinal: Negative for nausea, vomiting, abdominal pain and diarrhea.  Genitourinary: Negative for dysuria and difficulty urinating.  Musculoskeletal: Negative  for back pain.       Right knee pain as outlined.  Also reports bilateral hand stiffness.    Skin: Negative for color change and rash.  Neurological: Negative for dizziness, light-headedness and headaches.  Hematological: Negative for adenopathy. Does not bruise/bleed easily.  Psychiatric/Behavioral: Negative for dysphoric mood and agitation.       Objective:     Blood pressure rechecked by me:  130/82  Physical Exam  Constitutional: She is oriented to person, place, and time. She appears well-developed and well-nourished. No distress.  HENT:  Nose: Nose normal.  Mouth/Throat: Oropharynx is clear and moist.  Eyes: Right eye exhibits no discharge. Left eye exhibits no discharge. No scleral icterus.  Neck: Neck  supple. No thyromegaly present.  Cardiovascular: Normal rate and regular rhythm.   Pulmonary/Chest: Breath sounds normal. No accessory muscle usage. No tachypnea. No respiratory distress. She has no decreased breath sounds. She has no wheezes. She has no rhonchi. Right breast exhibits no inverted nipple, no mass, no nipple discharge and no tenderness (no axillary adenopathy). Left breast exhibits no inverted nipple, no mass, no nipple discharge and no tenderness (no axilarry adenopathy).  Abdominal: Soft. Bowel sounds are normal. There is no tenderness.  Musculoskeletal: She exhibits no edema or tenderness.  Increased pain - right knee.  Strength appears to be normal.    Lymphadenopathy:    She has no cervical adenopathy.  Neurological: She is alert and oriented to person, place, and time.  Skin: Skin is warm. No rash noted.  Psychiatric: She has a normal mood and affect. Her behavior is normal.    BP 104/69 mmHg  Pulse 64  Temp(Src) 98.1 F (36.7 C) (Oral)  Resp 18  Ht 5\' 2"  (1.575 m)  Wt 204 lb 8 oz (92.761 kg)  BMI 37.39 kg/m2  SpO2 96% Wt Readings from Last 3 Encounters:  10/02/15 204 lb 8 oz (92.761 kg)  07/17/15 201 lb 6.4 oz (91.354 kg)  06/26/15 202 lb 8 oz (91.853 kg)     Lab Results  Component Value Date   WBC 5.3 10/02/2015   HGB 13.3 10/02/2015   HCT 38.6 10/02/2015   PLT 186.0 10/02/2015   GLUCOSE 94 10/02/2015   CHOL 184 10/02/2015   TRIG 64.0 10/02/2015   HDL 79.40 10/02/2015   LDLDIRECT 131.0 03/19/2013   LDLCALC 91 10/02/2015   ALT 21 10/02/2015   AST 20 10/02/2015   NA 135 10/02/2015   K 4.1 10/02/2015   CL 98 10/02/2015   CREATININE 0.92 10/02/2015   BUN 12 10/02/2015   CO2 32 10/02/2015   TSH 3.08 10/02/2015    Mm Screening Breast Tomo Bilateral  03/18/2015  CLINICAL DATA:  Screening. EXAM: DIGITAL SCREENING BILATERAL MAMMOGRAM WITH 3D TOMO WITH CAD COMPARISON:  Previous exam(s). ACR Breast Density Category b: There are scattered areas of  fibroglandular density. FINDINGS: There are no findings suspicious for malignancy. Images were processed with CAD. IMPRESSION: No mammographic evidence of malignancy. A result letter of this screening mammogram will be mailed directly to the patient. RECOMMENDATION: Screening mammogram in one year. (Code:SM-B-01Y) BI-RADS CATEGORY  1: Negative. Electronically Signed   By: Lovey Newcomer M.D.   On: 03/18/2015 12:34       Assessment & Plan:   Problem List Items Addressed This Visit    Anemia    Recheck cbc.        Relevant Orders   CBC with Differential/Platelet (Completed)   GERD (gastroesophageal reflux disease)  S/p EGD and dilatation.  On omeprazole.  Symptoms controlled.        Health care maintenance    Physical today 10/02/15.  Mammogram 03/18/15 - Birads I.  Colonoscopy 11/2012 - internal hemorrhoids otherwise normal.  Recommended f/u colonoscopy in five years.       Hypercholesterolemia    On lovastatin.  Low cholesterol diet and exercise.  Follow lipid panel and liver function tests.        Relevant Orders   Lipid panel (Completed)   Hepatic function panel (Completed)   Hypertension    Blood pressure under good control.  Continue same medication regimen.  Follow pressures.  Follow metabolic panel.        Relevant Orders   Basic metabolic panel (Completed)   Hypothyroidism    On thyroid replacement.  Follow tsh.       Relevant Orders   TSH (Completed)   Right knee pain    Persistent knee pain as outlined.  Exam as outlined.  Check xray.  May need ortho referral.        Relevant Orders   DG Knee 1-2 Views Right (Completed)   Stress    Has good support.  Does not feel needs anything more at this time.  Follow.        Other Visit Diagnoses    Screening breast examination    -  Primary    Relevant Orders    MM DIGITAL SCREENING BILATERAL        Einar Pheasant, MD

## 2015-10-03 ENCOUNTER — Encounter: Payer: Self-pay | Admitting: *Deleted

## 2015-10-03 ENCOUNTER — Encounter: Payer: Self-pay | Admitting: Internal Medicine

## 2015-10-03 NOTE — Progress Notes (Signed)
6/17-sent mychart message-will call if unread on Monday/lsw

## 2015-10-07 ENCOUNTER — Other Ambulatory Visit: Payer: Self-pay | Admitting: Internal Medicine

## 2015-10-07 DIAGNOSIS — M25561 Pain in right knee: Secondary | ICD-10-CM

## 2015-10-07 NOTE — Progress Notes (Signed)
Order placed for ortho referral.   

## 2015-10-08 ENCOUNTER — Encounter: Payer: Self-pay | Admitting: Internal Medicine

## 2015-10-08 NOTE — Assessment & Plan Note (Signed)
Persistent knee pain as outlined.  Exam as outlined.  Check xray.  May need ortho referral.

## 2015-10-08 NOTE — Assessment & Plan Note (Signed)
On lovastatin.  Low cholesterol diet and exercise.  Follow lipid panel and liver function tests.   

## 2015-10-08 NOTE — Assessment & Plan Note (Signed)
Blood pressure under good control.  Continue same medication regimen.  Follow pressures.  Follow metabolic panel.   

## 2015-10-08 NOTE — Assessment & Plan Note (Signed)
Has good support.  Does not feel needs anything more at this time.  Follow.

## 2015-10-08 NOTE — Assessment & Plan Note (Signed)
Recheck cbc.  

## 2015-10-08 NOTE — Assessment & Plan Note (Signed)
S/p EGD and dilatation.  On omeprazole.  Symptoms controlled.

## 2015-10-08 NOTE — Assessment & Plan Note (Signed)
On thyroid replacement.  Follow tsh.  

## 2015-10-13 ENCOUNTER — Encounter: Payer: Self-pay | Admitting: Internal Medicine

## 2015-10-26 DIAGNOSIS — M1711 Unilateral primary osteoarthritis, right knee: Secondary | ICD-10-CM | POA: Diagnosis not present

## 2015-10-26 DIAGNOSIS — M25561 Pain in right knee: Secondary | ICD-10-CM | POA: Diagnosis not present

## 2015-12-15 DIAGNOSIS — M1711 Unilateral primary osteoarthritis, right knee: Secondary | ICD-10-CM | POA: Diagnosis not present

## 2015-12-22 DIAGNOSIS — M1711 Unilateral primary osteoarthritis, right knee: Secondary | ICD-10-CM | POA: Diagnosis not present

## 2015-12-29 DIAGNOSIS — M1711 Unilateral primary osteoarthritis, right knee: Secondary | ICD-10-CM | POA: Diagnosis not present

## 2016-01-19 ENCOUNTER — Other Ambulatory Visit: Payer: Self-pay | Admitting: Internal Medicine

## 2016-01-19 NOTE — Telephone Encounter (Signed)
Last filled 08/31/15 #90 1 Rf

## 2016-01-25 DIAGNOSIS — M25562 Pain in left knee: Secondary | ICD-10-CM | POA: Diagnosis not present

## 2016-01-25 DIAGNOSIS — M1712 Unilateral primary osteoarthritis, left knee: Secondary | ICD-10-CM | POA: Diagnosis not present

## 2016-01-27 ENCOUNTER — Other Ambulatory Visit: Payer: Self-pay | Admitting: Internal Medicine

## 2016-03-18 ENCOUNTER — Ambulatory Visit
Admission: RE | Admit: 2016-03-18 | Discharge: 2016-03-18 | Disposition: A | Discharge status: Home / Self Care | Payer: Commercial Managed Care - HMO | Source: Ambulatory Visit | Attending: Internal Medicine | Admitting: Internal Medicine

## 2016-03-18 DIAGNOSIS — Z1231 Encounter for screening mammogram for malignant neoplasm of breast: Secondary | ICD-10-CM | POA: Diagnosis not present

## 2016-03-18 DIAGNOSIS — Z1239 Encounter for other screening for malignant neoplasm of breast: Secondary | ICD-10-CM

## 2016-04-01 ENCOUNTER — Encounter: Payer: Self-pay | Admitting: Internal Medicine

## 2016-04-01 ENCOUNTER — Ambulatory Visit (INDEPENDENT_AMBULATORY_CARE_PROVIDER_SITE_OTHER): Payer: Commercial Managed Care - HMO | Admitting: Internal Medicine

## 2016-04-01 VITALS — BP 124/80 | HR 67 | Temp 98.1°F | Ht 62.0 in | Wt 211.0 lb

## 2016-04-01 DIAGNOSIS — K219 Gastro-esophageal reflux disease without esophagitis: Secondary | ICD-10-CM

## 2016-04-01 DIAGNOSIS — F439 Reaction to severe stress, unspecified: Secondary | ICD-10-CM

## 2016-04-01 DIAGNOSIS — E7800 Pure hypercholesterolemia, unspecified: Secondary | ICD-10-CM

## 2016-04-01 DIAGNOSIS — M25521 Pain in right elbow: Secondary | ICD-10-CM

## 2016-04-01 DIAGNOSIS — D649 Anemia, unspecified: Secondary | ICD-10-CM

## 2016-04-01 DIAGNOSIS — I10 Essential (primary) hypertension: Secondary | ICD-10-CM | POA: Diagnosis not present

## 2016-04-01 DIAGNOSIS — G479 Sleep disorder, unspecified: Secondary | ICD-10-CM

## 2016-04-01 DIAGNOSIS — E039 Hypothyroidism, unspecified: Secondary | ICD-10-CM | POA: Diagnosis not present

## 2016-04-01 LAB — BASIC METABOLIC PANEL
BUN: 15 mg/dL (ref 6–23)
CO2: 34 mEq/L — ABNORMAL HIGH (ref 19–32)
Calcium: 9.8 mg/dL (ref 8.4–10.5)
Chloride: 99 mEq/L (ref 96–112)
Creatinine, Ser: 0.89 mg/dL (ref 0.40–1.20)
GFR: 66.6 mL/min (ref >60.00)
GLUCOSE: 94 mg/dL (ref 70–99)
POTASSIUM: 3.8 meq/L (ref 3.5–5.1)
Sodium: 138 mEq/L (ref 135–145)

## 2016-04-01 LAB — LIPID PANEL
CHOLESTEROL: 205 mg/dL — AB (ref 0–200)
HDL: 88.6 mg/dL (ref >39.00)
LDL CALC: 100 mg/dL — AB (ref 0–99)
NonHDL: 116.1
Total CHOL/HDL Ratio: 2
Triglycerides: 79 mg/dL (ref 0.0–149.0)
VLDL: 15.8 mg/dL (ref 0.0–40.0)

## 2016-04-01 LAB — HEPATIC FUNCTION PANEL
ALK PHOS: 54 U/L (ref 39–117)
ALT: 29 U/L (ref 0–35)
AST: 22 U/L (ref 0–37)
Albumin: 4.2 g/dL (ref 3.5–5.2)
BILIRUBIN DIRECT: 0.2 mg/dL (ref 0.0–0.3)
BILIRUBIN TOTAL: 0.9 mg/dL (ref 0.2–1.2)
Total Protein: 6.4 g/dL (ref 6.0–8.3)

## 2016-04-01 NOTE — Patient Instructions (Signed)
Elbow strap 

## 2016-04-01 NOTE — Progress Notes (Signed)
Patient ID: Amanda Browning, female   DOB: 05-16-1945, 70 y.o.   MRN: [id number]   Subjective:    Patient ID: Amanda Browning, female    DOB: 24-May-1945, 70 y.o.   MRN: [id number]  HPI  Patient here for a scheduled follow up.  She has been having some right shoulder and elbow pain.  States started two weeks ago.  No known injury or trauma.  No swelling.  No neck issues.  She has been taking tylenol.  Does help some.  No chest pain.  No sob.  No acid reflux.  No abdominal pain or cramping.  Bowels stable.  Left knee was bothering her.  Saw ortho.  S/p injection.  Better.  Increased stress.  Discussed with her today. Has good family support.  She had stopped the trazodone.  Wants to restart.  Discussed possible side effects of the medication.  Rides her bike 3-4 miles per day.     Past Medical History:  Diagnosis Date  . Colon polyps   . DDD (degenerative disc disease), cervical 02/05/13   C5-C6 and C6-C7  . GERD (gastroesophageal reflux disease)   . History of chicken pox   . Hypercholesterolemia   . Hypertension   . Hypothyroidism   . Migraines    Past Surgical History:  Procedure Laterality Date  . BREAST BIOPSY  2012   stereotactic  . VAGINAL HYSTERECTOMY  1994   ovaries not removed   Family History  Problem Relation Age of Onset  . Heart disease Mother   . Stroke Mother   . Hypertension Mother   . Heart disease Father     enlarged heart  . Stroke Father   . Ovarian cancer Paternal Aunt   . Breast cancer Sister 68  . Cancer Sister     Breast Cancer  . Colon cancer Neg Hx    Social History   Social History  . Marital status: Divorced    Spouse name: N/A  . Number of children: N/A  . Years of education: N/A   Social History Main Topics  . Smoking status: Never Smoker  . Smokeless tobacco: Never Used  . Alcohol use No  . Drug use: No  . Sexual activity: No   Other Topics Concern  . None   Social History Narrative  . None    Outpatient Encounter  Prescriptions as of 04/01/2016  Medication Sig  . aspirin 81 MG tablet Take 81 mg by mouth daily.  . cetirizine (ZYRTEC) 10 MG tablet Take 1 tablet (10 mg total) by mouth daily.  . ferrous sulfate 325 (65 FE) MG tablet Take 325 mg by mouth daily.  Marland Kitchen levothyroxine (SYNTHROID, LEVOTHROID) 25 MCG tablet TAKE 1 TABLET EVERY DAY BEFORE BREAKFAST  . lisinopril-hydrochlorothiazide (PRINZIDE,ZESTORETIC) 20-25 MG tablet TAKE 1 TABLET EVERY DAY  . lovastatin (MEVACOR) 40 MG tablet TAKE 1 TABLET AT BEDTIME  . magnesium oxide (MAG-OX) 400 MG tablet Take 1 tablet (400 mg total) by mouth daily.  . Multiple Vitamin (MULTIVITAMIN) capsule Take 1 capsule by mouth daily.  Marland Kitchen omeprazole (PRILOSEC) 20 MG capsule TAKE 1 CAPSULE EVERY DAY  . traZODone (DESYREL) 50 MG tablet TAKE 1/2 TO 1 TABLET AT BEDTIME AS NEEDED FOR SLEEP   No facility-administered encounter medications on file as of 04/01/2016.     Review of Systems  Constitutional: Negative for appetite change and unexpected weight change.  HENT: Negative for congestion and sinus pressure.   Respiratory: Negative for cough, chest tightness and shortness  of breath.   Cardiovascular: Negative for chest pain, palpitations and leg swelling.  Gastrointestinal: Negative for abdominal pain, diarrhea, nausea and vomiting.  Genitourinary: Negative for difficulty urinating and dysuria.  Musculoskeletal:       Right elbow and arm pain as outlined.  Left knee better.   Skin: Negative for color change and rash.  Neurological: Negative for dizziness, light-headedness and headaches.  Psychiatric/Behavioral: Negative for agitation and dysphoric mood.       Increased stress as outlined.         Objective:     Blood pressure rechecked by me:  136/84  Physical Exam  Constitutional: She appears well-developed and well-nourished. No distress.  HENT:  Nose: Nose normal.  Mouth/Throat: Oropharynx is clear and moist.  Neck: Neck supple. No thyromegaly present.    Cardiovascular: Normal rate and regular rhythm.   Pulmonary/Chest: Breath sounds normal. No respiratory distress. She has no wheezes.  Abdominal: Soft. Bowel sounds are normal. There is no tenderness.  Musculoskeletal: She exhibits no edema or tenderness.  Increased pain to palpation in her right elbow.  Some pain with twisting of her forearm.    Lymphadenopathy:    She has no cervical adenopathy.  Skin: No rash noted. No erythema.  Psychiatric: She has a normal mood and affect. Her behavior is normal.    BP 124/80   Pulse 67   Temp 98.1 F (36.7 C) (Oral)   Ht 5\' 2"  (1.575 m)   Wt 211 lb (95.7 kg)   SpO2 94%   BMI 38.59 kg/m  Wt Readings from Last 3 Encounters:  04/01/16 211 lb (95.7 kg)  10/02/15 204 lb 8 oz (92.8 kg)  07/17/15 201 lb 6.4 oz (91.4 kg)     Lab Results  Component Value Date   WBC 5.3 10/02/2015   HGB 13.3 10/02/2015   HCT 38.6 10/02/2015   PLT 186.0 10/02/2015   GLUCOSE 94 10/02/2015   CHOL 184 10/02/2015   TRIG 64.0 10/02/2015   HDL 79.40 10/02/2015   LDLDIRECT 131.0 03/19/2013   LDLCALC 91 10/02/2015   ALT 21 10/02/2015   AST 20 10/02/2015   NA 135 10/02/2015   K 4.1 10/02/2015   CL 98 10/02/2015   CREATININE 0.92 10/02/2015   BUN 12 10/02/2015   CO2 32 10/02/2015   TSH 3.08 10/02/2015    Mm Digital Screening Bilateral  Result Date: 03/18/2016 CLINICAL DATA:  Screening. EXAM: DIGITAL SCREENING BILATERAL MAMMOGRAM WITH CAD COMPARISON:  Previous exam(s). ACR Breast Density Category b: There are scattered areas of fibroglandular density. FINDINGS: There are no findings suspicious for malignancy. Images were processed with CAD. IMPRESSION: No mammographic evidence of malignancy. A result letter of this screening mammogram will be mailed directly to the patient. RECOMMENDATION: Screening mammogram in one year. (Code:SM-B-01Y) BI-RADS CATEGORY  1: Negative. Electronically Signed   By: Margarette Canada M.D.   On: 03/18/2016 16:58       Assessment &  Plan:   Problem List Items Addressed This Visit    None       Einar Pheasant, MD

## 2016-04-01 NOTE — Progress Notes (Signed)
Pre visit review using our clinic review tool, if applicable. No additional management support is needed unless otherwise documented below in the visit note. 

## 2016-04-02 ENCOUNTER — Encounter: Payer: Self-pay | Admitting: Internal Medicine

## 2016-04-03 ENCOUNTER — Encounter: Payer: Self-pay | Admitting: Internal Medicine

## 2016-04-03 DIAGNOSIS — M25521 Pain in right elbow: Secondary | ICD-10-CM | POA: Insufficient documentation

## 2016-04-03 NOTE — Assessment & Plan Note (Signed)
Blood pressure under good control.  Continue same medication regimen.  Follow pressures.  Follow metabolic panel.   

## 2016-04-03 NOTE — Assessment & Plan Note (Signed)
S/p EGD and dilatation.  On prilosec.  Controlled.   

## 2016-04-03 NOTE — Assessment & Plan Note (Signed)
On lovastatin.  Low cholesterol diet and exercise.  Follow lipid panel and liver function tests.   

## 2016-04-03 NOTE — Assessment & Plan Note (Signed)
Follow cbc.  

## 2016-04-03 NOTE — Assessment & Plan Note (Signed)
Right elbow and arm pain.  Exam as outlined.  Elbow strap.  Tylenol.  Discussed further evaluation. She wants to hold on further evaluation at this time.  Will notify me if persistent pain or if changes her mind.

## 2016-04-03 NOTE — Assessment & Plan Note (Signed)
On thyroid replacement.  Follow tsh.  

## 2016-04-03 NOTE — Assessment & Plan Note (Signed)
Increased stress as outlined.  Has good support.  Wants to go back on trazodone.  Follow.

## 2016-04-03 NOTE — Assessment & Plan Note (Signed)
Wants to go back on trazodone.  Follow.

## 2016-04-08 NOTE — Telephone Encounter (Signed)
Unread mychart message mailed to patient 

## 2016-06-27 ENCOUNTER — Encounter: Payer: Self-pay | Admitting: Internal Medicine

## 2016-06-28 ENCOUNTER — Telehealth: Payer: Self-pay

## 2016-06-28 NOTE — Telephone Encounter (Signed)
Left message to return call to our office.  

## 2016-06-28 NOTE — Telephone Encounter (Signed)
She has a scheduled appt with me on 07/08/16.  Is she wanting to cancel that appt.  I am ok with her going ahead and scheduling her physical for June.  It has to be after 10/01/16.

## 2016-06-28 NOTE — Telephone Encounter (Signed)
See other open message.

## 2016-07-08 ENCOUNTER — Ambulatory Visit: Payer: Commercial Managed Care - HMO | Admitting: Internal Medicine

## 2016-07-11 ENCOUNTER — Telehealth: Payer: Self-pay | Admitting: *Deleted

## 2016-07-11 NOTE — Telephone Encounter (Signed)
Patient stated that she has a scratchy throat and a slight cough, no other symptoms to report. Pt feels as though she's not sick enough to come for a visit, however she insisted on suggestions to treat her symptoms before they worsen.  Pt contact [phone]

## 2016-07-11 NOTE — Telephone Encounter (Signed)
Reason for call: scratchy throat  Symptoms: scratchy throat, cough , no fever  No chest congestion, no head congestion, no other symptoms to report ,  Duration 2 days  Medications: Tussin DM Please advise

## 2016-07-11 NOTE — Telephone Encounter (Signed)
If just scratchy throat, then symptomatic treatment.  Hot liquids.  Gargle with salt water.  Can continue tussin.  If any drainage, can use nasacort nasal spray - 2 sprays each nostril one time per day.  Do this in the evening.  If persistent symptoms, will need to be evaluated.

## 2016-07-12 NOTE — Telephone Encounter (Signed)
Patient advised of below and verbalized understanding.  

## 2016-07-14 ENCOUNTER — Telehealth: Payer: Self-pay | Admitting: *Deleted

## 2016-07-14 NOTE — Telephone Encounter (Signed)
Reason for call: cough    Symptoms: dry cough, fever , continuous cough   Duration Sunday  Medications:Sudafed , Tussin DM Advised patient to go to urgent care for further evaluation and follow up with [place]. Please advise.

## 2016-07-14 NOTE — Telephone Encounter (Signed)
With fever and persistent cough, needs evaluation.  Would recommend evaluation today to confirm diagnosis and treatment needed.

## 2016-07-14 NOTE — Telephone Encounter (Signed)
Patient advised of below , she is hesitant to go to urgent care.   I encouraged her to go to urgent care.

## 2016-07-14 NOTE — Telephone Encounter (Signed)
Patient reported having a continuous cough, she has been taking blood pressure friendly OTC cough mediation, that no longer works. Patient requested a call.  Pt contact [phone]

## 2016-07-15 DIAGNOSIS — J209 Acute bronchitis, unspecified: Secondary | ICD-10-CM | POA: Diagnosis not present

## 2016-07-18 ENCOUNTER — Ambulatory Visit: Payer: Commercial Managed Care - HMO

## 2016-07-22 ENCOUNTER — Ambulatory Visit (INDEPENDENT_AMBULATORY_CARE_PROVIDER_SITE_OTHER): Payer: Medicare HMO

## 2016-07-22 VITALS — BP 130/82 | HR 85 | Temp 97.8°F | Resp 14 | Ht 61.0 in | Wt 207.1 lb

## 2016-07-22 DIAGNOSIS — Z0000 Encounter for general adult medical examination without abnormal findings: Secondary | ICD-10-CM | POA: Diagnosis not present

## 2016-07-22 NOTE — Patient Instructions (Addendum)
  Amanda Browning , Thank you for taking time to come for your Medicare Wellness Visit. I appreciate your ongoing commitment to your health goals. Please review the following plan we discussed and let me know if I can assist you in the future.   Follow up with Dr. Nicki Reaper as needed.    Have a great day!  These are the goals we discussed: Goals    . Healthy Lifestyle          Stay hydrated and continue drinking plenty of fluids/water. Low carb foods. Lean meats, fruits and vegetables. Stay active and maintain exercise regimen at the gym.       This is a list of the screening recommended for you and due dates:  Health Maintenance  Topic Date Due  . Tetanus Vaccine  10/15/2017*  .  Hepatitis C: One time screening is recommended by Center for Disease Control  (CDC) for  adults born from [birth year] through 1965.   10/15/2017*  . Flu Shot  11/16/2016  . Mammogram  03/18/2017  . Colon Cancer Screening  11/20/2022  . DEXA scan (bone density measurement)  Completed  . Pneumonia vaccines  Completed  *Topic was postponed. The date shown is not the original due date.

## 2016-07-22 NOTE — Progress Notes (Signed)
Subjective:   Amanda Browning is a 71 y.o. female who presents for Medicare Annual (Subsequent) preventive examination.  Review of Systems:  No ROS.  Medicare Wellness Visit. Cardiac Risk Factors include: advanced age (>71men, >66 women)     Objective:     Vitals: BP 130/82 (BP Location: Right Arm, Patient Position: Sitting, Cuff Size: Normal)   Pulse 85   Temp 97.8 F (36.6 C) (Oral)   Resp 14   Ht 5\' 1"  (1.549 m)   Wt 207 lb 1.9 oz (93.9 kg)   SpO2 94%   BMI 39.13 kg/m   Body mass index is 39.13 kg/m.   Tobacco History  Smoking Status  . Never Smoker  Smokeless Tobacco  . Never Used     Counseling given: Not Answered   Past Medical History:  Diagnosis Date  . Colon polyps   . DDD (degenerative disc disease), cervical 02/05/13   C5-C6 and C6-C7  . GERD (gastroesophageal reflux disease)   . History of chicken pox   . Hypercholesterolemia   . Hypertension   . Hypothyroidism   . Migraines    Past Surgical History:  Procedure Laterality Date  . BREAST BIOPSY  2012   stereotactic  . VAGINAL HYSTERECTOMY  1994   ovaries not removed   Family History  Problem Relation Age of Onset  . Heart disease Mother   . Stroke Mother   . Hypertension Mother   . Heart disease Father     enlarged heart  . Stroke Father   . Ovarian cancer Paternal Aunt   . Breast cancer Sister 25  . Cancer Sister     Breast Cancer  . Colon cancer Neg Hx    History  Sexual Activity  . Sexual activity: No    Outpatient Encounter Prescriptions as of 07/22/2016  Medication Sig  . amoxicillin (AMOXIL) 875 MG tablet Take 875 mg by mouth 2 (two) times daily.  Marland Kitchen aspirin 81 MG tablet Take 81 mg by mouth daily.  . cetirizine (ZYRTEC) 10 MG tablet Take 1 tablet (10 mg total) by mouth daily.  . ferrous sulfate 325 (65 FE) MG tablet Take 325 mg by mouth daily.  Marland Kitchen levothyroxine (SYNTHROID, LEVOTHROID) 25 MCG tablet TAKE 1 TABLET EVERY DAY BEFORE BREAKFAST  .  lisinopril-hydrochlorothiazide (PRINZIDE,ZESTORETIC) 20-25 MG tablet TAKE 1 TABLET EVERY DAY  . lovastatin (MEVACOR) 40 MG tablet TAKE 1 TABLET AT BEDTIME  . magnesium oxide (MAG-OX) 400 MG tablet Take 1 tablet (400 mg total) by mouth daily.  . Multiple Vitamin (MULTIVITAMIN) capsule Take 1 capsule by mouth daily.  Marland Kitchen omeprazole (PRILOSEC) 20 MG capsule TAKE 1 CAPSULE EVERY DAY  . traZODone (DESYREL) 50 MG tablet TAKE 1/2 TO 1 TABLET AT BEDTIME AS NEEDED FOR SLEEP   No facility-administered encounter medications on file as of 07/22/2016.     Activities of Daily Living In your present state of health, do you have any difficulty performing the following activities: 07/22/2016  Hearing? N  Vision? N  Difficulty concentrating or making decisions? N  Walking or climbing stairs? Y  Dressing or bathing? N  Doing errands, shopping? N  Preparing Food and eating ? N  Using the Toilet? N  In the past six months, have you accidently leaked urine? N  Do you have problems with loss of bowel control? N  Managing your Medications? N  Managing your Finances? N  Housekeeping or managing your Housekeeping? N  Some recent data might be hidden  Patient Care Team: Einar Pheasant, MD as PCP - General (Internal Medicine)    Assessment:    This is a routine wellness examination for [name]. The goal of the wellness visit is to assist the patient how to close the gaps in care and create a preventative care plan for the patient.   Osteoporosis risk reviewed.  Medications reviewed; taking without issues or barriers.  Safety issues reviewed; smoke detectors in the home. No firearms in the home.   Wears seatbelts when driving or riding with others. Patient does wear sunscreen or protective clothing when in direct sunlight. No violence in the home.  Patient is alert, normal appearance, oriented to person/place/and time. Correctly identified the president of the [place], recall of 3/3 words, and performing  simple calculations.  Patient displays appropriate judgement and can read correct time from watch face.  No new identified risk were noted.  No failures at ADL's or IADL's.   BMI- discussed the importance of a healthy diet, water intake and exercise. Educational material provided.   Daily fluid intake: 0 cups of caffeine, 3 cups of water  HTN- followed by PCP.  Dental- every six months.  Dr. Kenton Kingfisher.  Eye- Visual acuity not assessed per patient preference since they have regular follow up with the ophthalmologist.  Wears corrective lenses.  Sleep patterns- Sleeps 6-7 hours at night.  Wakes feeling rested.  TDAP vaccine postponed per patient preference.  Follow up with insurance.  Educational material provided.  Hepatitis C Screening discussed; postponed per patient request.    Health maintenance gaps- closed.  Patient Concerns: Request for dermatology referral due to a spot on her lower left forearm noticed 4 months ago. 1cm in size, no redness, no swelling, no bleeding/weeping at the site, no itch.  She notes no change in color or size.  Offered a follow up with PCP and she opted to wait until her next scheduled physical and have referral only, at this time.  Referral ordered, follow as directed.  Follow up with PCP as needed.  Exercise Activities and Dietary recommendations Current Exercise Habits: Home exercise routine, Type of exercise: calisthenics, Time (Minutes): 20, Frequency (Times/Week): 3, Weekly Exercise (Minutes/Week): 60, Intensity: Moderate  Goals    . Healthy Lifestyle          Stay hydrated and continue drinking plenty of fluids/water. Low carb foods. Lean meats, fruits and vegetables. Stay active and maintain exercise regimen at the gym.      Fall Risk Fall Risk  07/22/2016 10/02/2015 07/17/2015 10/01/2014 08/13/2013  Falls in the past year? No No No No No   Depression Screen PHQ 2/9 Scores 07/22/2016 10/02/2015 07/17/2015 10/01/2014  PHQ - 2 Score 0 0 0 0      Cognitive Function MMSE - Mini Mental State Exam 07/17/2015  Orientation to time 5  Orientation to Place 5  Registration 3  Attention/ Calculation 5  Recall 3  Language- name 2 objects 2  Language- repeat 1  Language- follow 3 step command 3  Language- read & follow direction 1  Write a sentence 1  Copy design 1  Total score 30        Immunization History  Administered Date(s) Administered  . Influenza Split 04/03/2012  . Influenza,inj,Quad PF,36+ Mos 01/15/2013, 01/23/2014, 12/10/2014  . Pneumococcal Conjugate-13 01/23/2014  . Pneumococcal Polysaccharide-23 01/15/2013   Screening Tests Health Maintenance  Topic Date Due  . TETANUS/TDAP  10/15/2017 (Originally 12/31/1964)  . Hepatitis C Screening  10/15/2017 (Originally [birth year])  .  INFLUENZA VACCINE  11/16/2016  . MAMMOGRAM  03/18/2017  . COLONOSCOPY  11/20/2022  . DEXA SCAN  Completed  . PNA vac Low Risk Adult  Completed      Plan:    End of life planning; Advanced aging; Advanced directives discussed.  No HCPOA/Living Will.  Additional information declined at this time.  Medicare Attestation I have personally reviewed: The patient's medical and social history Their use of alcohol, tobacco or illicit drugs Their current medications and supplements The patient's functional ability including ADLs,fall risks, home safety risks, cognitive, and hearing and visual impairment Diet and physical activities Evidence for depression   The patient's weight, height, BMI, and visual acuity have been recorded in the chart.  I have made referrals and provided education to the patient based on review of the above and I have provided the patient with a written personalized care plan for preventive services.     During the course of the visit the patient was educated and counseled about the following appropriate screening and preventive services:   Vaccines to include Pneumoccal, Influenza, Hepatitis B, Td, Zostavax,  HCV  Colorectal cancer screening-UTD  Bone density screening-UTD  Glaucoma screening-annual eye exams  Mammography-UTD  Nutrition counseling   Patient Instructions (the written plan) was given to the patient.   Varney Biles, LPN  05/19/1153   Reviewed above.  Agree with dermatology referral.   Dr Nicki Reaper

## 2016-07-27 DIAGNOSIS — L720 Epidermal cyst: Secondary | ICD-10-CM | POA: Diagnosis not present

## 2016-07-27 DIAGNOSIS — L570 Actinic keratosis: Secondary | ICD-10-CM | POA: Diagnosis not present

## 2016-07-27 DIAGNOSIS — D485 Neoplasm of uncertain behavior of skin: Secondary | ICD-10-CM | POA: Diagnosis not present

## 2016-07-27 DIAGNOSIS — L821 Other seborrheic keratosis: Secondary | ICD-10-CM | POA: Diagnosis not present

## 2016-07-27 DIAGNOSIS — L814 Other melanin hyperpigmentation: Secondary | ICD-10-CM | POA: Diagnosis not present

## 2016-07-27 DIAGNOSIS — D0462 Carcinoma in situ of skin of left upper limb, including shoulder: Secondary | ICD-10-CM | POA: Diagnosis not present

## 2016-08-18 ENCOUNTER — Other Ambulatory Visit: Payer: Self-pay | Admitting: Internal Medicine

## 2016-08-24 DIAGNOSIS — L905 Scar conditions and fibrosis of skin: Secondary | ICD-10-CM | POA: Diagnosis not present

## 2016-08-24 DIAGNOSIS — D0462 Carcinoma in situ of skin of left upper limb, including shoulder: Secondary | ICD-10-CM | POA: Diagnosis not present

## 2016-10-28 ENCOUNTER — Encounter: Payer: Commercial Managed Care - HMO | Admitting: Internal Medicine

## 2016-12-02 DIAGNOSIS — M25561 Pain in right knee: Secondary | ICD-10-CM | POA: Diagnosis not present

## 2016-12-02 DIAGNOSIS — M1711 Unilateral primary osteoarthritis, right knee: Secondary | ICD-10-CM | POA: Diagnosis not present

## 2016-12-30 ENCOUNTER — Encounter: Payer: Medicare HMO | Admitting: Internal Medicine

## 2017-01-05 DIAGNOSIS — M1711 Unilateral primary osteoarthritis, right knee: Secondary | ICD-10-CM | POA: Diagnosis not present

## 2017-01-08 DIAGNOSIS — M1711 Unilateral primary osteoarthritis, right knee: Secondary | ICD-10-CM | POA: Insufficient documentation

## 2017-01-20 ENCOUNTER — Ambulatory Visit (INDEPENDENT_AMBULATORY_CARE_PROVIDER_SITE_OTHER): Payer: Medicare HMO | Admitting: Internal Medicine

## 2017-01-20 ENCOUNTER — Encounter: Payer: Self-pay | Admitting: Internal Medicine

## 2017-01-20 ENCOUNTER — Other Ambulatory Visit (HOSPITAL_COMMUNITY)
Admission: RE | Admit: 2017-01-20 | Discharge: 2017-01-20 | Disposition: A | Discharge status: Home / Self Care | Payer: Medicare HMO | Source: Ambulatory Visit | Attending: Internal Medicine | Admitting: Internal Medicine

## 2017-01-20 VITALS — BP 130/84 | HR 85 | Temp 98.2°F | Resp 14 | Wt 213.8 lb

## 2017-01-20 DIAGNOSIS — R252 Cramp and spasm: Secondary | ICD-10-CM

## 2017-01-20 DIAGNOSIS — M25561 Pain in right knee: Secondary | ICD-10-CM

## 2017-01-20 DIAGNOSIS — Z0000 Encounter for general adult medical examination without abnormal findings: Secondary | ICD-10-CM

## 2017-01-20 DIAGNOSIS — E7800 Pure hypercholesterolemia, unspecified: Secondary | ICD-10-CM | POA: Diagnosis not present

## 2017-01-20 DIAGNOSIS — F439 Reaction to severe stress, unspecified: Secondary | ICD-10-CM | POA: Diagnosis not present

## 2017-01-20 DIAGNOSIS — Z124 Encounter for screening for malignant neoplasm of cervix: Secondary | ICD-10-CM | POA: Diagnosis not present

## 2017-01-20 DIAGNOSIS — Z1231 Encounter for screening mammogram for malignant neoplasm of breast: Secondary | ICD-10-CM | POA: Diagnosis not present

## 2017-01-20 DIAGNOSIS — Z23 Encounter for immunization: Secondary | ICD-10-CM

## 2017-01-20 DIAGNOSIS — D649 Anemia, unspecified: Secondary | ICD-10-CM | POA: Diagnosis not present

## 2017-01-20 DIAGNOSIS — Z1239 Encounter for other screening for malignant neoplasm of breast: Secondary | ICD-10-CM

## 2017-01-20 DIAGNOSIS — I10 Essential (primary) hypertension: Secondary | ICD-10-CM

## 2017-01-20 DIAGNOSIS — E039 Hypothyroidism, unspecified: Secondary | ICD-10-CM | POA: Diagnosis not present

## 2017-01-20 DIAGNOSIS — K219 Gastro-esophageal reflux disease without esophagitis: Secondary | ICD-10-CM | POA: Diagnosis not present

## 2017-01-20 NOTE — Progress Notes (Signed)
Patient ID: Amanda Browning, female   DOB: May 18, 1945, 71 y.o.   MRN: [id number]   Subjective:    Patient ID: Amanda Browning, female    DOB: 20-Oct-1945, 71 y.o.   MRN: [id number]  HPI  Patient with past history of hypercholesterolemia and hypertension.  She is here today for her physical exam.  She states she is doing relatively well.  Handling stress.  Doing ok.  Has good support.  Does not feel needs anything more.  No chest pain.  No sob.  No acid reflux.  No abdominal pain.  Bowels moving.  No urine change.  Planning for knee surgery 04/17/17.  Persistent knee pain.     Past Medical History:  Diagnosis Date  . Colon polyps   . DDD (degenerative disc disease), cervical 02/05/13   C5-C6 and C6-C7  . GERD (gastroesophageal reflux disease)   . History of chicken pox   . Hypercholesterolemia   . Hypertension   . Hypothyroidism   . Migraines    Past Surgical History:  Procedure Laterality Date  . BREAST BIOPSY  2012   stereotactic  . VAGINAL HYSTERECTOMY  1994   ovaries not removed   Family History  Problem Relation Age of Onset  . Heart disease Mother   . Stroke Mother   . Hypertension Mother   . Heart disease Father        enlarged heart  . Stroke Father   . Ovarian cancer Paternal Aunt   . Breast cancer Sister 75  . Cancer Sister        Breast Cancer  . Colon cancer Neg Hx    Social History   Social History  . Marital status: Divorced    Spouse name: N/A  . Number of children: N/A  . Years of education: N/A   Social History Main Topics  . Smoking status: Never Smoker  . Smokeless tobacco: Never Used  . Alcohol use No  . Drug use: No  . Sexual activity: No   Other Topics Concern  . None   Social History Narrative  . None    Outpatient Encounter Prescriptions as of 01/20/2017  Medication Sig  . aspirin 81 MG tablet Take 81 mg by mouth daily.  . cetirizine (ZYRTEC) 10 MG tablet Take 1 tablet (10 mg total) by mouth daily.  Marland Kitchen levothyroxine  (SYNTHROID, LEVOTHROID) 25 MCG tablet TAKE 1 TABLET EVERY DAY BEFORE BREAKFAST  . lisinopril-hydrochlorothiazide (PRINZIDE,ZESTORETIC) 20-25 MG tablet TAKE 1 TABLET EVERY DAY  . lovastatin (MEVACOR) 40 MG tablet TAKE 1 TABLET AT BEDTIME  . magnesium oxide (MAG-OX) 400 MG tablet Take 1 tablet (400 mg total) by mouth daily.  . Multiple Vitamin (MULTIVITAMIN) capsule Take 1 capsule by mouth daily.  Marland Kitchen omeprazole (PRILOSEC) 20 MG capsule TAKE 1 CAPSULE EVERY DAY  . [DISCONTINUED] amoxicillin (AMOXIL) 875 MG tablet Take 875 mg by mouth 2 (two) times daily.  . [DISCONTINUED] ferrous sulfate 325 (65 FE) MG tablet Take 325 mg by mouth daily.  . [DISCONTINUED] traZODone (DESYREL) 50 MG tablet TAKE 1/2 TO 1 TABLET AT BEDTIME AS NEEDED FOR SLEEP   No facility-administered encounter medications on file as of 01/20/2017.     Review of Systems  Constitutional: Negative for appetite change and unexpected weight change.  HENT: Negative for congestion and sinus pressure.   Eyes: Negative for pain and visual disturbance.  Respiratory: Negative for cough, chest tightness and shortness of breath.   Cardiovascular: Negative for chest pain, palpitations and leg  swelling.  Gastrointestinal: Negative for abdominal pain, diarrhea, nausea and vomiting.  Genitourinary: Negative for difficulty urinating and dysuria.  Musculoskeletal: Negative for back pain.       Knee pain as outlined.    Skin: Negative for color change and rash.  Neurological: Negative for dizziness, light-headedness and headaches.  Hematological: Negative for adenopathy. Does not bruise/bleed easily.  Psychiatric/Behavioral: Negative for agitation and dysphoric mood.       Objective:     Blood pressure rechecked by me:  126-130/84  Physical Exam  Constitutional: She is oriented to person, place, and time. She appears well-developed and well-nourished. No distress.  HENT:  Nose: Nose normal.  Mouth/Throat: Oropharynx is clear and moist.    Eyes: Right eye exhibits no discharge. Left eye exhibits no discharge. No scleral icterus.  Neck: Neck supple. No thyromegaly present.  Cardiovascular: Normal rate and regular rhythm.   Pulmonary/Chest: Breath sounds normal. No accessory muscle usage. No tachypnea. No respiratory distress. She has no decreased breath sounds. She has no wheezes. She has no rhonchi. Right breast exhibits no inverted nipple, no mass, no nipple discharge and no tenderness (no axillary adenopathy). Left breast exhibits no inverted nipple, no mass, no nipple discharge and no tenderness (no axilarry adenopathy).  Abdominal: Soft. Bowel sounds are normal. There is no tenderness.  Musculoskeletal: She exhibits no edema or tenderness.  Lymphadenopathy:    She has no cervical adenopathy.  Neurological: She is alert and oriented to person, place, and time.  Skin: Skin is warm. No rash noted. No erythema.  Psychiatric: She has a normal mood and affect. Her behavior is normal.    BP 130/84   Pulse 85   Temp 98.2 F (36.8 C) (Oral)   Resp 14   Wt 213 lb 12.8 oz (97 kg)   SpO2 96%   BMI 40.40 kg/m  Wt Readings from Last 3 Encounters:  01/20/17 213 lb 12.8 oz (97 kg)  07/22/16 207 lb 1.9 oz (93.9 kg)  04/01/16 211 lb (95.7 kg)     Lab Results  Component Value Date   WBC 6.8 01/20/2017   HGB 13.0 01/20/2017   HCT 37.8 01/20/2017   PLT 212 01/20/2017   GLUCOSE 90 01/20/2017   CHOL 213 (H) 01/20/2017   TRIG 72 01/20/2017   HDL 102 01/20/2017   LDLDIRECT 131.0 03/19/2013   LDLCALC 100 (H) 04/01/2016   ALT 20 01/20/2017   AST 19 01/20/2017   NA 130 (L) 01/20/2017   K 3.9 01/20/2017   CL 93 (L) 01/20/2017   CREATININE 0.89 01/20/2017   BUN 12 01/20/2017   CO2 26 01/20/2017   TSH 2.44 01/20/2017    Mm Digital Screening Bilateral  Result Date: 03/18/2016 CLINICAL DATA:  Screening. EXAM: DIGITAL SCREENING BILATERAL MAMMOGRAM WITH CAD COMPARISON:  Previous exam(s). ACR Breast Density Category b: There  are scattered areas of fibroglandular density. FINDINGS: There are no findings suspicious for malignancy. Images were processed with CAD. IMPRESSION: No mammographic evidence of malignancy. A result letter of this screening mammogram will be mailed directly to the patient. RECOMMENDATION: Screening mammogram in one year. (Code:SM-B-01Y) BI-RADS CATEGORY  1: Negative. Electronically Signed   By: Margarette [place] M.D.   On: 03/18/2016 16:58       Assessment & Plan:   Problem List Items Addressed This Visit    Anemia   Relevant Orders   CBC with Differential/Platelet (Completed)   GERD (gastroesophageal reflux disease)    S/p EGD and dilatation.  On  prilosec.  Controlled.        Health care maintenance    Physical today 01/20/17.  Mammogram 03/18/16 - birads I.  Schedule for f/u mammogram.  PAP 01/20/17.  Colonoscopy 11/2012 as outlined.  Schedule f/u colonoscopy in 5 years.        Hypercholesterolemia    On lovastatin.  Low cholesterol diet and exercise.  Follow lipid panel and liver function tests.        Relevant Orders   Lipid panel (Completed)   Hepatic function panel (Completed)   Hypertension    Blood pressure on recheck improved.  Same medication regimen.  Follow pressures.  Follow metabolic panel.       Relevant Orders   Basic metabolic panel (Completed)   Hypothyroidism    On thyroid replacement.  Follow tsh.        Relevant Orders   TSH (Completed)   Leg cramps    Persistent.  Check magnesium level.        Relevant Orders   Magnesium (Completed)   Right knee pain    Evaluated by ortho.  Planning for surgery 04/17/17.        Stress    Has good support.  Overall she feels things are stable.  Does not feel needs anything more at this time.  Follow.         Other Visit Diagnoses    Screening for breast cancer    -  Primary   Relevant Orders   MM DIGITAL SCREENING BILATERAL   Encounter for immunization       Relevant Orders   Flu vaccine HIGH DOSE PF (Completed)    Screening for cervical cancer       Relevant Orders   Cytology - PAP       Einar Pheasant, MD

## 2017-01-21 LAB — HEPATIC FUNCTION PANEL
AG RATIO: 1.6 (calc) (ref 1.0–2.5)
ALBUMIN MSPROF: 4 g/dL (ref 3.6–5.1)
ALKALINE PHOSPHATASE (APISO): 61 U/L (ref 33–130)
ALT: 20 U/L (ref 6–29)
AST: 19 U/L (ref 10–35)
Bilirubin, Direct: 0.2 mg/dL (ref 0.0–0.2)
Globulin: 2.5 g/dL (calc) (ref 1.9–3.7)
Indirect Bilirubin: 0.7 mg/dL (calc) (ref 0.2–1.2)
TOTAL PROTEIN: 6.5 g/dL (ref 6.1–8.1)
Total Bilirubin: 0.9 mg/dL (ref 0.2–1.2)

## 2017-01-21 LAB — LIPID PANEL
Cholesterol: 213 mg/dL — ABNORMAL HIGH (ref <200)
HDL: 102 mg/dL (ref >50)
LDL CHOLESTEROL (CALC): 95 mg/dL
Non-HDL Cholesterol (Calc): 111 mg/dL (calc) (ref <130)
Total CHOL/HDL Ratio: 2.1 (calc) (ref <5.0)
Triglycerides: 72 mg/dL (ref <150)

## 2017-01-21 LAB — CBC WITH DIFFERENTIAL/PLATELET
BASOS ABS: 41 {cells}/uL (ref 0–200)
Basophils Relative: 0.6 %
EOS PCT: 1.5 %
Eosinophils Absolute: 102 cells/uL (ref 15–500)
HCT: 37.8 % (ref 35.0–45.0)
Hemoglobin: 13 g/dL (ref 11.7–15.5)
Lymphs Abs: 2060 cells/uL (ref 850–3900)
MCH: 33 pg (ref 27.0–33.0)
MCHC: 34.4 g/dL (ref 32.0–36.0)
MCV: 95.9 fL (ref 80.0–100.0)
MPV: 9.7 fL (ref 7.5–12.5)
Monocytes Relative: 9.8 %
Neutro Abs: 3930 cells/uL (ref 1500–7800)
Neutrophils Relative %: 57.8 %
PLATELETS: 212 10*3/uL (ref 140–400)
RBC: 3.94 10*6/uL (ref 3.80–5.10)
RDW: 11.8 % (ref 11.0–15.0)
TOTAL LYMPHOCYTE: 30.3 %
WBC mixed population: 666 cells/uL (ref 200–950)
WBC: 6.8 10*3/uL (ref 3.8–10.8)

## 2017-01-21 LAB — BASIC METABOLIC PANEL
BUN: 12 mg/dL (ref 7–25)
CO2: 26 mmol/L (ref 20–32)
CREATININE: 0.89 mg/dL (ref 0.60–0.93)
Calcium: 9.6 mg/dL (ref 8.6–10.4)
Chloride: 93 mmol/L — ABNORMAL LOW (ref 98–110)
Glucose, Bld: 90 mg/dL (ref 65–99)
POTASSIUM: 3.9 mmol/L (ref 3.5–5.3)
Sodium: 130 mmol/L — ABNORMAL LOW (ref 135–146)

## 2017-01-21 LAB — TSH: TSH: 2.44 mIU/L (ref 0.40–4.50)

## 2017-01-21 LAB — MAGNESIUM: MAGNESIUM: 1.6 mg/dL (ref 1.5–2.5)

## 2017-01-22 ENCOUNTER — Other Ambulatory Visit: Payer: Self-pay | Admitting: Internal Medicine

## 2017-01-22 DIAGNOSIS — E871 Hypo-osmolality and hyponatremia: Secondary | ICD-10-CM

## 2017-01-22 NOTE — Progress Notes (Signed)
Order placed for f/u sodium check.   

## 2017-01-23 ENCOUNTER — Encounter: Payer: Self-pay | Admitting: Internal Medicine

## 2017-01-23 NOTE — Assessment & Plan Note (Signed)
On thyroid replacement.  Follow tsh.  

## 2017-01-23 NOTE — Assessment & Plan Note (Signed)
S/p EGD and dilatation.  On prilosec.  Controlled.

## 2017-01-23 NOTE — Assessment & Plan Note (Signed)
Physical today 01/20/17.  Mammogram 03/18/16 - birads I.  Schedule for f/u mammogram.  PAP 01/20/17.  Colonoscopy 11/2012 as outlined.  Schedule f/u colonoscopy in 5 years.

## 2017-01-23 NOTE — Assessment & Plan Note (Signed)
Blood pressure on recheck improved.  Same medication regimen.  Follow pressures.  Follow metabolic panel.   

## 2017-01-23 NOTE — Assessment & Plan Note (Signed)
Has good support.  Overall she feels things are stable.  Does not feel needs anything more at this time.  Follow.

## 2017-01-23 NOTE — Assessment & Plan Note (Signed)
On lovastatin.  Low cholesterol diet and exercise.  Follow lipid panel and liver function tests.   

## 2017-01-23 NOTE — Assessment & Plan Note (Signed)
Evaluated by ortho.  Planning for surgery 04/17/17.

## 2017-01-23 NOTE — Assessment & Plan Note (Signed)
Persistent.  Check magnesium level.

## 2017-01-24 ENCOUNTER — Telehealth: Payer: Self-pay | Admitting: Internal Medicine

## 2017-01-24 MED ORDER — MAGNESIUM OXIDE 400 MG PO TABS: 400.0000 mg | ORAL_TABLET | Freq: Two times a day (BID) | ORAL | 2 refills | Status: AC

## 2017-01-24 NOTE — Telephone Encounter (Signed)
-----   Message from Einar Pheasant, MD sent at 01/24/2017  4:35 AM EDT ----- Increase magoxide to bid.  Will follow.

## 2017-01-24 NOTE — Telephone Encounter (Signed)
Pt called back returning your call. Please advise, thank you!  Call pt @ [phone]

## 2017-01-24 NOTE — Telephone Encounter (Signed)
See result note.  

## 2017-01-24 NOTE — Telephone Encounter (Signed)
Patient aware to increase magnesium Oxide to BID 400 mg .

## 2017-01-25 ENCOUNTER — Other Ambulatory Visit: Payer: Self-pay | Admitting: Internal Medicine

## 2017-01-25 LAB — CYTOLOGY - PAP
DIAGNOSIS: NEGATIVE
HPV (WINDOPATH): NOT DETECTED

## 2017-01-26 ENCOUNTER — Encounter: Payer: Self-pay | Admitting: Internal Medicine

## 2017-01-26 ENCOUNTER — Other Ambulatory Visit: Payer: Medicare HMO

## 2017-01-30 ENCOUNTER — Other Ambulatory Visit (INDEPENDENT_AMBULATORY_CARE_PROVIDER_SITE_OTHER): Payer: Medicare HMO

## 2017-01-30 DIAGNOSIS — E871 Hypo-osmolality and hyponatremia: Secondary | ICD-10-CM | POA: Diagnosis not present

## 2017-01-30 LAB — SODIUM: SODIUM: 127 meq/L — AB (ref 135–145)

## 2017-01-31 ENCOUNTER — Telehealth: Payer: Self-pay | Admitting: *Deleted

## 2017-01-31 DIAGNOSIS — E871 Hypo-osmolality and hyponatremia: Secondary | ICD-10-CM

## 2017-01-31 MED ORDER — LISINOPRIL 20 MG PO TABS: 20.0000 mg | ORAL_TABLET | Freq: Every day | ORAL | 0 refills | Status: DC

## 2017-01-31 MED ORDER — LISINOPRIL 20 MG PO TABS: 20.0000 mg | ORAL_TABLET | Freq: Every day | ORAL | 1 refills | Status: DC

## 2017-01-31 NOTE — Telephone Encounter (Signed)
Not sure if this is hereditary - we will need to try and determine the etiology.  I am going to have her limit her free water intake and discontinue the hctz portion of her medication - this could be the etiology.

## 2017-01-31 NOTE — Telephone Encounter (Signed)
Notified patient of results , and patient voiced understanding, she stated she is drinking about 16 Oz water per day , but has been using Gatorade and diet coke, but that she did wanted to let PCP know that her mother was hospitalized for low sodium on several different occasions could this be something hereditary patient ask. Lab scheduled ,lab order in and script sent for lisinopril 10 day supply to local and a 90 day supply to mail order pharmacy.

## 2017-01-31 NOTE — Telephone Encounter (Signed)
-----   Message from Einar Pheasant, MD sent at 01/31/2017 12:57 AM EDT ----- Notify pt that her sodium level has decreased more.  She needs to decrease her free water intake by 1/3.  Confirm she is eating and no vomiting or diarrhea.  Will need to change her blood pressure medication.  She is currently on lisinopril/hctz 20/25.  Need to change to just lisinopril 20mg  q day.  (need to leave off the hctz).  Will need new rx sent in for lisinopril 20mg  q day.  She will need to follow pressures.  May need to adjust her medication more if blood pressure increases.  Needs f/u sodium check soon to confirm stable. Normal.  Needs f/u sodium check within the next 24-48 hours.

## 2017-01-31 NOTE — Telephone Encounter (Signed)
Patient notified

## 2017-02-02 ENCOUNTER — Other Ambulatory Visit (INDEPENDENT_AMBULATORY_CARE_PROVIDER_SITE_OTHER): Payer: Medicare HMO

## 2017-02-02 DIAGNOSIS — E871 Hypo-osmolality and hyponatremia: Secondary | ICD-10-CM | POA: Diagnosis not present

## 2017-02-02 LAB — SODIUM: Sodium: 127 mEq/L — ABNORMAL LOW (ref 135–145)

## 2017-02-03 ENCOUNTER — Other Ambulatory Visit: Payer: Self-pay | Admitting: Internal Medicine

## 2017-02-03 DIAGNOSIS — E871 Hypo-osmolality and hyponatremia: Secondary | ICD-10-CM

## 2017-02-03 NOTE — Progress Notes (Signed)
Order placed for f/u sodium check (met b)

## 2017-02-07 ENCOUNTER — Other Ambulatory Visit (INDEPENDENT_AMBULATORY_CARE_PROVIDER_SITE_OTHER): Payer: Medicare HMO

## 2017-02-07 DIAGNOSIS — E871 Hypo-osmolality and hyponatremia: Secondary | ICD-10-CM | POA: Diagnosis not present

## 2017-02-08 LAB — BASIC METABOLIC PANEL
BUN: 11 mg/dL (ref 6–23)
CALCIUM: 9.4 mg/dL (ref 8.4–10.5)
CHLORIDE: 97 meq/L (ref 96–112)
CO2: 30 meq/L (ref 19–32)
CREATININE: 0.94 mg/dL (ref 0.40–1.20)
GFR: 62.37 mL/min (ref >60.00)
GLUCOSE: 106 mg/dL — AB (ref 70–99)
Potassium: 4.6 mEq/L (ref 3.5–5.1)
Sodium: 133 mEq/L — ABNORMAL LOW (ref 135–145)

## 2017-02-09 ENCOUNTER — Other Ambulatory Visit: Payer: Self-pay | Admitting: Internal Medicine

## 2017-02-09 DIAGNOSIS — E871 Hypo-osmolality and hyponatremia: Secondary | ICD-10-CM

## 2017-02-09 NOTE — Progress Notes (Signed)
Order placed for f/u sodium.  ?

## 2017-02-10 ENCOUNTER — Ambulatory Visit: Payer: Medicare HMO | Admitting: Internal Medicine

## 2017-02-22 ENCOUNTER — Other Ambulatory Visit: Payer: Self-pay | Admitting: Internal Medicine

## 2017-02-23 ENCOUNTER — Other Ambulatory Visit (INDEPENDENT_AMBULATORY_CARE_PROVIDER_SITE_OTHER): Payer: Medicare HMO

## 2017-02-23 DIAGNOSIS — E871 Hypo-osmolality and hyponatremia: Secondary | ICD-10-CM

## 2017-02-24 ENCOUNTER — Encounter: Payer: Self-pay | Admitting: Internal Medicine

## 2017-02-24 LAB — SODIUM: SODIUM: 135 meq/L (ref 135–145)

## 2017-02-27 DIAGNOSIS — Z85828 Personal history of other malignant neoplasm of skin: Secondary | ICD-10-CM | POA: Diagnosis not present

## 2017-02-27 DIAGNOSIS — L820 Inflamed seborrheic keratosis: Secondary | ICD-10-CM | POA: Diagnosis not present

## 2017-02-27 DIAGNOSIS — L720 Epidermal cyst: Secondary | ICD-10-CM | POA: Diagnosis not present

## 2017-02-27 DIAGNOSIS — L814 Other melanin hyperpigmentation: Secondary | ICD-10-CM | POA: Diagnosis not present

## 2017-02-27 DIAGNOSIS — M67449 Ganglion, unspecified hand: Secondary | ICD-10-CM | POA: Diagnosis not present

## 2017-02-27 DIAGNOSIS — L570 Actinic keratosis: Secondary | ICD-10-CM | POA: Diagnosis not present

## 2017-02-27 DIAGNOSIS — L299 Pruritus, unspecified: Secondary | ICD-10-CM | POA: Diagnosis not present

## 2017-03-22 ENCOUNTER — Ambulatory Visit
Admission: RE | Admit: 2017-03-22 | Discharge: 2017-03-22 | Disposition: A | Discharge status: Home / Self Care | Payer: Medicare HMO | Source: Ambulatory Visit | Attending: Internal Medicine | Admitting: Internal Medicine

## 2017-03-22 DIAGNOSIS — Z1231 Encounter for screening mammogram for malignant neoplasm of breast: Secondary | ICD-10-CM | POA: Diagnosis not present

## 2017-03-22 DIAGNOSIS — Z1239 Encounter for other screening for malignant neoplasm of breast: Secondary | ICD-10-CM

## 2017-03-22 DIAGNOSIS — H5203 Hypermetropia, bilateral: Secondary | ICD-10-CM | POA: Diagnosis not present

## 2017-04-04 DIAGNOSIS — M25561 Pain in right knee: Secondary | ICD-10-CM | POA: Diagnosis not present

## 2017-04-05 ENCOUNTER — Encounter
Admission: RE | Admit: 2017-04-05 | Discharge: 2017-04-05 | Disposition: A | Discharge status: Home / Self Care | Payer: Medicare HMO | Source: Ambulatory Visit | Attending: Orthopedic Surgery | Admitting: Orthopedic Surgery

## 2017-04-05 ENCOUNTER — Other Ambulatory Visit: Payer: Self-pay

## 2017-04-05 DIAGNOSIS — Z01812 Encounter for preprocedural laboratory examination: Secondary | ICD-10-CM | POA: Diagnosis not present

## 2017-04-05 DIAGNOSIS — I10 Essential (primary) hypertension: Secondary | ICD-10-CM | POA: Insufficient documentation

## 2017-04-05 DIAGNOSIS — Z01810 Encounter for preprocedural cardiovascular examination: Secondary | ICD-10-CM | POA: Diagnosis not present

## 2017-04-05 HISTORY — DX: Other complications of anesthesia, initial encounter: T88.59XA

## 2017-04-05 HISTORY — DX: Other specified postprocedural states: Z98.890

## 2017-04-05 HISTORY — DX: Other specified postprocedural states: R11.2

## 2017-04-05 HISTORY — DX: Adverse effect of unspecified anesthetic, initial encounter: T41.45XA

## 2017-04-05 LAB — COMPREHENSIVE METABOLIC PANEL
ALBUMIN: 4 g/dL (ref 3.5–5.0)
ALK PHOS: 62 U/L (ref 38–126)
ALT: 23 U/L (ref 14–54)
ANION GAP: 7 (ref 5–15)
AST: 24 U/L (ref 15–41)
BUN: 16 mg/dL (ref 6–20)
CALCIUM: 9.7 mg/dL (ref 8.9–10.3)
CO2: 31 mmol/L (ref 22–32)
Chloride: 101 mmol/L (ref 101–111)
Creatinine, Ser: 0.85 mg/dL (ref 0.44–1.00)
GFR calc Af Amer: >60 mL/min (ref >60)
GFR calc non Af Amer: >60 mL/min (ref >60)
GLUCOSE: 108 mg/dL — AB (ref 65–99)
Potassium: 3.5 mmol/L (ref 3.5–5.1)
SODIUM: 139 mmol/L (ref 135–145)
Total Bilirubin: 0.7 mg/dL (ref 0.3–1.2)
Total Protein: 6.7 g/dL (ref 6.5–8.1)

## 2017-04-05 LAB — CBC
HCT: 40.5 % (ref 35.0–47.0)
HEMOGLOBIN: 13.6 g/dL (ref 12.0–16.0)
MCH: 33.4 pg (ref 26.0–34.0)
MCHC: 33.7 g/dL (ref 32.0–36.0)
MCV: 99 fL (ref 80.0–100.0)
Platelets: 214 10*3/uL (ref 150–440)
RBC: 4.08 MIL/uL (ref 3.80–5.20)
RDW: 12.1 % (ref 11.5–14.5)
WBC: 5.9 10*3/uL (ref 3.6–11.0)

## 2017-04-05 LAB — PROTIME-INR
INR: 0.99
Prothrombin Time: 13 seconds (ref 11.4–15.2)

## 2017-04-05 LAB — URINALYSIS, ROUTINE W REFLEX MICROSCOPIC
BILIRUBIN URINE: NEGATIVE
Glucose, UA: NEGATIVE mg/dL
HGB URINE DIPSTICK: NEGATIVE
Ketones, ur: NEGATIVE mg/dL
Leukocytes, UA: NEGATIVE
Nitrite: NEGATIVE
PROTEIN: NEGATIVE mg/dL
Specific Gravity, Urine: 1.008 (ref 1.005–1.030)
pH: 6 (ref 5.0–8.0)

## 2017-04-05 LAB — TYPE AND SCREEN
ABO/RH(D): O POS
Antibody Screen: NEGATIVE

## 2017-04-05 LAB — C-REACTIVE PROTEIN: CRP: 1 mg/dL — ABNORMAL HIGH (ref <1.0)

## 2017-04-05 LAB — SURGICAL PCR SCREEN
MRSA, PCR: NEGATIVE
STAPHYLOCOCCUS AUREUS: NEGATIVE

## 2017-04-05 LAB — SEDIMENTATION RATE: Sed Rate: 58 mm/hr — ABNORMAL HIGH (ref 0–30)

## 2017-04-05 LAB — APTT: APTT: 28 s (ref 24–36)

## 2017-04-05 NOTE — Patient Instructions (Signed)
  Your procedure is scheduled on: Monday Dec. 31st , 2018. Report to Same Day Surgery. To find out your arrival time please call [phone] between 1PM - 3PM on Friday Dec. 28th, 2018 .  Remember: Instructions that are not followed completely may result in serious medical risk, up to and including death, or upon the discretion of your surgeon and anesthesiologist your surgery may need to be rescheduled.    _x___ 1. Do not eat food after midnight night prior to surgery.     No gum chewing or hard candies, snacks or breakfast.     May drink the following:      water      Gatorade     clear apple juice      black coffee      or black tea      ____ 2. No Alcohol for 24 hours before or after surgery.   ____ 3. Bring all medications with you on the day of surgery if instructed.    __x__ 4. Notify your doctor if there is any change in your medical condition     (cold, fever, infections).    _____ 5.   Do Not Smoke or use e-cigarettes For 24 Hours Prior to Your   Surgery.  Do not use any chewable tobacco products for at least 6   hours prior to  surgery.                  Do not wear jewelry, make-up, hairpins, clips or nail polish.  Do not wear lotions, powders, or perfumes.   Do not shave 48 hours prior to surgery. Men may shave face and neck.  Do not bring valuables to the hospital.    [facility] is not responsible for any belongings or valuables.               Contacts, dentures or bridgework may not be worn into surgery.  Leave your suitcase in the car. After surgery it may be brought to your room.  For patients admitted to the hospital, discharge time is determined by your  treatment team.   Patients discharged the day of surgery will not be allowed to drive home.    Please read over the following fact sheets that you were given:   [facility] Preparing for Surgery             __x__ Take these medicines the morning of surgery with A SIP OF WATER:    1.  levothyroxine (SYNTHROID, LEVOTHROID)  2. omeprazole (PRILOSEC) take at bedtime the night prior to surgery and the morning of surgery.       ____ Fleet Enema (as directed)   __x__ Use CHG Soap as directed on instruction sheet  ____ Use inhalers on the day of surgery and bring to hospital day of surgery  ____ Stop metformin 2 days prior to surgery    ____ Take 1/2 of usual insulin dose the night before surgery and none on the morning of          surgery.   __x__ Stop aspirin 7 days prior to surgery.  ____ Stop Anti-inflammatories such as Advil, Aleve, Ibuprofen, Motrin, Naproxen,  Naprosyn, Goodies powders or aspirin products. OK to take Tylenol.   _x__ Stop supplements: glucosamine-chondroitin until after surgery.    ____ Bring C-Pap to the hospital.

## 2017-04-05 NOTE — Pre-Procedure Instructions (Signed)
Spoke to Dr. Marcello Moores regarding today's EKG, OK to proceed.

## 2017-04-06 LAB — URINE CULTURE
CULTURE: NO GROWTH
SPECIAL REQUESTS: NORMAL

## 2017-04-12 NOTE — Pre-Procedure Instructions (Signed)
Pt brought in home meds to review in person.

## 2017-04-15 NOTE — Discharge Instructions (Signed)
°  Instructions after Total Knee Replacement ° ° Jennel Mara P. Lesta Limbert, Jr., M.D.    ° Dept. of Orthopaedics & Sports Medicine ° [facility] ° [address] ° [place], [place]  [address] ° Phone: [phone]   Fax: [phone] ° °  °DIET: °• Drink plenty of non-alcoholic fluids. °• Resume your normal diet. Include foods high in fiber. ° °ACTIVITY:  °• You may use crutches or a walker with weight-bearing as tolerated, unless instructed otherwise. °• You may be weaned off of the walker or crutches by your Physical Therapist.  °• Do NOT place pillows under the knee. Anything placed under the knee could limit your ability to straighten the knee.   °• Continue doing gentle exercises. Exercising will reduce the pain and swelling, increase motion, and prevent muscle weakness.   °• Please continue to use the TED compression stockings for 6 weeks. You may remove the stockings at night, but should reapply them in the morning. °• Do not drive or operate any equipment until instructed. ° °WOUND CARE:  °• Continue to use the PolarCare or ice packs periodically to reduce pain and swelling. °• You may bathe or shower after the staples are removed at the first office visit following surgery. ° °MEDICATIONS: °• You may resume your regular medications. °• Please take the pain medication as prescribed on the medication. °• Do not take pain medication on an empty stomach. °• You have been given a prescription for a blood thinner (Lovenox or Coumadin). Please take the medication as instructed. (NOTE: After completing a 2 week course of Lovenox, take one Enteric-coated aspirin once a day. This along with elevation will help reduce the possibility of phlebitis in your operated leg.) °• Do not drive or drink alcoholic beverages when taking pain medications. ° °CALL THE OFFICE FOR: °• Temperature above 101 degrees °• Excessive bleeding or drainage on the dressing. °• Excessive swelling, coldness, or paleness of the toes. °• Persistent  nausea and vomiting. ° °FOLLOW-UP:  °• You should have an appointment to return to the office in 10-14 days after surgery. °• Arrangements have been made for continuation of Physical Therapy (either home therapy or outpatient therapy). °  °

## 2017-04-16 MED ORDER — CEFAZOLIN SODIUM-DEXTROSE 2-4 GM/100ML-% IV SOLN: 2.0000 g | INTRAVENOUS | Status: DC

## 2017-04-16 MED ORDER — TRANEXAMIC ACID 1000 MG/10ML IV SOLN
1000.0000 mg | INTRAVENOUS | Status: DC
  Filled 2017-04-16: qty 10

## 2017-04-17 ENCOUNTER — Ambulatory Visit: Payer: Medicare HMO | Admitting: Certified Registered"

## 2017-04-17 ENCOUNTER — Encounter: Payer: Self-pay | Admitting: Orthopedic Surgery

## 2017-04-17 ENCOUNTER — Other Ambulatory Visit: Payer: Self-pay

## 2017-04-17 ENCOUNTER — Inpatient Hospital Stay
Admission: RE | Admit: 2017-04-17 | Discharge: 2017-04-20 | DRG: 470 | Disposition: A | Discharge status: Skilled Nursing Facility | Payer: Medicare HMO | Source: Ambulatory Visit | Attending: Orthopedic Surgery | Admitting: Orthopedic Surgery

## 2017-04-17 ENCOUNTER — Inpatient Hospital Stay: Payer: Medicare HMO

## 2017-04-17 ENCOUNTER — Encounter
Admission: RE | Disposition: A | Discharge status: Skilled Nursing Facility | Payer: Self-pay | Source: Ambulatory Visit | Attending: Orthopedic Surgery

## 2017-04-17 DIAGNOSIS — Z885 Allergy status to narcotic agent status: Secondary | ICD-10-CM | POA: Diagnosis not present

## 2017-04-17 DIAGNOSIS — M5030 Other cervical disc degeneration, unspecified cervical region: Secondary | ICD-10-CM | POA: Diagnosis present

## 2017-04-17 DIAGNOSIS — I10 Essential (primary) hypertension: Secondary | ICD-10-CM | POA: Diagnosis present

## 2017-04-17 DIAGNOSIS — E7800 Pure hypercholesterolemia, unspecified: Secondary | ICD-10-CM | POA: Diagnosis not present

## 2017-04-17 DIAGNOSIS — Z96651 Presence of right artificial knee joint: Secondary | ICD-10-CM | POA: Diagnosis not present

## 2017-04-17 DIAGNOSIS — Z79899 Other long term (current) drug therapy: Secondary | ICD-10-CM | POA: Diagnosis not present

## 2017-04-17 DIAGNOSIS — K219 Gastro-esophageal reflux disease without esophagitis: Secondary | ICD-10-CM | POA: Diagnosis present

## 2017-04-17 DIAGNOSIS — M6281 Muscle weakness (generalized): Secondary | ICD-10-CM | POA: Diagnosis not present

## 2017-04-17 DIAGNOSIS — E039 Hypothyroidism, unspecified: Secondary | ICD-10-CM | POA: Diagnosis present

## 2017-04-17 DIAGNOSIS — R2689 Other abnormalities of gait and mobility: Secondary | ICD-10-CM | POA: Diagnosis not present

## 2017-04-17 DIAGNOSIS — M1711 Unilateral primary osteoarthritis, right knee: Principal | ICD-10-CM | POA: Diagnosis present

## 2017-04-17 DIAGNOSIS — Z471 Aftercare following joint replacement surgery: Secondary | ICD-10-CM | POA: Diagnosis not present

## 2017-04-17 DIAGNOSIS — M50323 Other cervical disc degeneration at C6-C7 level: Secondary | ICD-10-CM | POA: Diagnosis not present

## 2017-04-17 DIAGNOSIS — Z7982 Long term (current) use of aspirin: Secondary | ICD-10-CM

## 2017-04-17 DIAGNOSIS — Z96659 Presence of unspecified artificial knee joint: Secondary | ICD-10-CM

## 2017-04-17 HISTORY — PX: KNEE ARTHROPLASTY: SHX992

## 2017-04-17 LAB — ABO/RH: ABO/RH(D): O POS

## 2017-04-17 SURGERY — ARTHROPLASTY, KNEE, TOTAL, USING IMAGELESS COMPUTER-ASSISTED NAVIGATION
Anesthesia: Spinal | Site: Knee | Laterality: Right | Wound class: Clean

## 2017-04-17 MED ORDER — TETRACAINE HCL 1 % IJ SOLN
INTRAMUSCULAR | Status: DC | PRN
  Administered 2017-04-17: 6 mg via INTRASPINAL

## 2017-04-17 MED ORDER — SODIUM CHLORIDE 0.9 % IJ SOLN
INTRAMUSCULAR | Status: AC
  Filled 2017-04-17: qty 50

## 2017-04-17 MED ORDER — TRAMADOL HCL 50 MG PO TABS
50.0000 mg | ORAL_TABLET | ORAL | Status: DC | PRN
  Administered 2017-04-17: 100 mg via ORAL
  Administered 2017-04-18: 50 mg via ORAL
  Administered 2017-04-18: 100 mg via ORAL
  Administered 2017-04-18 – 2017-04-19 (×5): 50 mg via ORAL
  Filled 2017-04-17: qty 2
  Filled 2017-04-17 (×6): qty 1
  Filled 2017-04-17: qty 2

## 2017-04-17 MED ORDER — LISINOPRIL 20 MG PO TABS
20.0000 mg | ORAL_TABLET | Freq: Every day | ORAL | Status: DC
  Administered 2017-04-17 – 2017-04-20 (×4): 20 mg via ORAL
  Filled 2017-04-17 (×4): qty 1

## 2017-04-17 MED ORDER — LIDOCAINE HCL (PF) 2 % IJ SOLN
INTRAMUSCULAR | Status: AC
  Filled 2017-04-17: qty 10

## 2017-04-17 MED ORDER — FENTANYL CITRATE (PF) 100 MCG/2ML IJ SOLN: 25.0000 ug | INTRAMUSCULAR | Status: DC | PRN

## 2017-04-17 MED ORDER — METOCLOPRAMIDE HCL 10 MG PO TABS
10.0000 mg | ORAL_TABLET | Freq: Three times a day (TID) | ORAL | Status: AC
  Administered 2017-04-17 – 2017-04-18 (×2): 10 mg via ORAL
  Filled 2017-04-17 (×3): qty 1

## 2017-04-17 MED ORDER — HYDROCHLOROTHIAZIDE 25 MG PO TABS
25.0000 mg | ORAL_TABLET | Freq: Every day | ORAL | Status: DC
  Administered 2017-04-17 – 2017-04-20 (×4): 25 mg via ORAL
  Filled 2017-04-17 (×4): qty 1

## 2017-04-17 MED ORDER — SODIUM CHLORIDE 0.9 % IV SOLN
INTRAVENOUS | Status: DC | PRN
  Administered 2017-04-17: 60 mL

## 2017-04-17 MED ORDER — BUPIVACAINE HCL (PF) 0.25 % IJ SOLN
INTRAMUSCULAR | Status: AC
  Filled 2017-04-17: qty 60

## 2017-04-17 MED ORDER — PROPOFOL 500 MG/50ML IV EMUL
INTRAVENOUS | Status: AC
  Filled 2017-04-17: qty 50

## 2017-04-17 MED ORDER — PRAVASTATIN SODIUM 20 MG PO TABS
40.0000 mg | ORAL_TABLET | Freq: Every day | ORAL | Status: DC
Start: 2017-04-18 — End: 2017-04-20
  Administered 2017-04-18 – 2017-04-19 (×2): 40 mg via ORAL
  Filled 2017-04-17 (×2): qty 2

## 2017-04-17 MED ORDER — ONDANSETRON HCL 4 MG/2ML IJ SOLN: 4.0000 mg | Freq: Four times a day (QID) | INTRAMUSCULAR | Status: DC | PRN

## 2017-04-17 MED ORDER — ACETAMINOPHEN 10 MG/ML IV SOLN
INTRAVENOUS | Status: AC
  Filled 2017-04-17: qty 100

## 2017-04-17 MED ORDER — CHLORHEXIDINE GLUCONATE 4 % EX LIQD: 60.0000 mL | Freq: Once | CUTANEOUS | Status: DC

## 2017-04-17 MED ORDER — ACETAMINOPHEN 10 MG/ML IV SOLN
INTRAVENOUS | Status: DC | PRN
  Administered 2017-04-17: 1000 mg via INTRAVENOUS

## 2017-04-17 MED ORDER — PANTOPRAZOLE SODIUM 40 MG PO TBEC
40.0000 mg | DELAYED_RELEASE_TABLET | Freq: Two times a day (BID) | ORAL | Status: DC
  Administered 2017-04-17 – 2017-04-20 (×6): 40 mg via ORAL
  Filled 2017-04-17 (×6): qty 1

## 2017-04-17 MED ORDER — VITAMIN D 1000 UNITS PO TABS
1000.0000 [IU] | ORAL_TABLET | Freq: Every day | ORAL | Status: DC
  Administered 2017-04-18 – 2017-04-20 (×3): 1000 [IU] via ORAL
  Filled 2017-04-17 (×3): qty 1

## 2017-04-17 MED ORDER — DEXTROSE 5 % IV SOLN
2.0000 g | Freq: Four times a day (QID) | INTRAVENOUS | Status: AC
  Administered 2017-04-17 – 2017-04-18 (×4): 2 g via INTRAVENOUS
  Filled 2017-04-17 (×4): qty 2000

## 2017-04-17 MED ORDER — NEOMYCIN-POLYMYXIN B GU 40-200000 IR SOLN
Status: DC | PRN
  Administered 2017-04-17: 14 mL

## 2017-04-17 MED ORDER — KETAMINE HCL 50 MG/ML IJ SOLN
INTRAMUSCULAR | Status: DC | PRN
  Administered 2017-04-17: 50 mg via INTRAVENOUS

## 2017-04-17 MED ORDER — BUPIVACAINE HCL (PF) 0.25 % IJ SOLN
INTRAMUSCULAR | Status: DC | PRN
  Administered 2017-04-17: 60 mL

## 2017-04-17 MED ORDER — CEFAZOLIN SODIUM-DEXTROSE 2-4 GM/100ML-% IV SOLN
INTRAVENOUS | Status: AC
  Filled 2017-04-17: qty 100

## 2017-04-17 MED ORDER — LEVOTHYROXINE SODIUM 25 MCG PO TABS
25.0000 ug | ORAL_TABLET | Freq: Every day | ORAL | Status: DC
  Administered 2017-04-18 – 2017-04-20 (×3): 25 ug via ORAL
  Filled 2017-04-17 (×3): qty 1

## 2017-04-17 MED ORDER — LISINOPRIL-HYDROCHLOROTHIAZIDE 20-25 MG PO TABS: 1.0000 | ORAL_TABLET | Freq: Every day | ORAL | Status: DC

## 2017-04-17 MED ORDER — PROPOFOL 10 MG/ML IV BOLUS
INTRAVENOUS | Status: DC | PRN
  Administered 2017-04-17: 20 mg via INTRAVENOUS

## 2017-04-17 MED ORDER — TRANEXAMIC ACID 1000 MG/10ML IV SOLN
INTRAVENOUS | Status: DC | PRN
  Administered 2017-04-17: 1000 mg via INTRAVENOUS

## 2017-04-17 MED ORDER — ONDANSETRON HCL 4 MG/2ML IJ SOLN: 4.0000 mg | Freq: Once | INTRAMUSCULAR | Status: DC | PRN

## 2017-04-17 MED ORDER — FERROUS SULFATE 325 (65 FE) MG PO TABS
325.0000 mg | ORAL_TABLET | Freq: Two times a day (BID) | ORAL | Status: DC
  Administered 2017-04-18 – 2017-04-20 (×5): 325 mg via ORAL
  Filled 2017-04-17 (×5): qty 1

## 2017-04-17 MED ORDER — FLEET ENEMA 7-19 GM/118ML RE ENEM: 1.0000 | ENEMA | Freq: Once | RECTAL | Status: DC | PRN

## 2017-04-17 MED ORDER — ACETAMINOPHEN 325 MG PO TABS
650.0000 mg | ORAL_TABLET | ORAL | Status: DC | PRN
  Administered 2017-04-18 – 2017-04-20 (×3): 650 mg via ORAL
  Filled 2017-04-17 (×3): qty 2

## 2017-04-17 MED ORDER — LIDOCAINE HCL (PF) 2 % IJ SOLN
INTRAMUSCULAR | Status: DC | PRN
  Administered 2017-04-17: 50 mg

## 2017-04-17 MED ORDER — SODIUM CHLORIDE 0.9 % IV SOLN
INTRAVENOUS | Status: DC
  Administered 2017-04-17: 20:00:00 via INTRAVENOUS

## 2017-04-17 MED ORDER — MORPHINE SULFATE (PF) 2 MG/ML IV SOLN: 2.0000 mg | INTRAVENOUS | Status: DC | PRN

## 2017-04-17 MED ORDER — FENTANYL CITRATE (PF) 100 MCG/2ML IJ SOLN
INTRAMUSCULAR | Status: DC | PRN
  Administered 2017-04-17: 50 ug via INTRAVENOUS
  Administered 2017-04-17 (×2): 25 ug via INTRAVENOUS

## 2017-04-17 MED ORDER — BUPIVACAINE HCL (PF) 0.5 % IJ SOLN
INTRAMUSCULAR | Status: AC
  Filled 2017-04-17: qty 10

## 2017-04-17 MED ORDER — ONDANSETRON HCL 4 MG PO TABS: 4.0000 mg | ORAL_TABLET | Freq: Four times a day (QID) | ORAL | Status: DC | PRN

## 2017-04-17 MED ORDER — ACETAMINOPHEN 650 MG RE SUPP: 650.0000 mg | RECTAL | Status: DC | PRN

## 2017-04-17 MED ORDER — GLYCOPYRROLATE 0.2 MG/ML IJ SOLN
INTRAMUSCULAR | Status: DC | PRN
Start: 2017-04-17 — End: 2017-04-17
  Administered 2017-04-17: 0.2 mg via INTRAVENOUS

## 2017-04-17 MED ORDER — CEFAZOLIN SODIUM-DEXTROSE 2-3 GM-%(50ML) IV SOLR
INTRAVENOUS | Status: DC | PRN
  Administered 2017-04-17: 2 g via INTRAVENOUS

## 2017-04-17 MED ORDER — MENTHOL 3 MG MT LOZG
1.0000 | LOZENGE | OROMUCOSAL | Status: DC | PRN
  Filled 2017-04-17: qty 9

## 2017-04-17 MED ORDER — SENNOSIDES-DOCUSATE SODIUM 8.6-50 MG PO TABS
1.0000 | ORAL_TABLET | Freq: Two times a day (BID) | ORAL | Status: DC
  Administered 2017-04-17 – 2017-04-20 (×6): 1 via ORAL
  Filled 2017-04-17 (×6): qty 1

## 2017-04-17 MED ORDER — PROPOFOL 500 MG/50ML IV EMUL
INTRAVENOUS | Status: DC | PRN
  Administered 2017-04-17: 25 ug/kg/min via INTRAVENOUS

## 2017-04-17 MED ORDER — OXYCODONE HCL 5 MG PO TABS
5.0000 mg | ORAL_TABLET | ORAL | Status: DC | PRN
  Filled 2017-04-17: qty 1

## 2017-04-17 MED ORDER — DIPHENHYDRAMINE HCL 12.5 MG/5ML PO ELIX: 12.5000 mg | ORAL_SOLUTION | ORAL | Status: DC | PRN

## 2017-04-17 MED ORDER — PROPOFOL 10 MG/ML IV BOLUS
INTRAVENOUS | Status: AC
  Filled 2017-04-17: qty 20

## 2017-04-17 MED ORDER — ENOXAPARIN SODIUM 30 MG/0.3ML ~~LOC~~ SOLN
30.0000 mg | Freq: Two times a day (BID) | SUBCUTANEOUS | Status: DC
  Administered 2017-04-18 – 2017-04-20 (×5): 30 mg via SUBCUTANEOUS
  Filled 2017-04-17 (×5): qty 0.3

## 2017-04-17 MED ORDER — ADULT MULTIVITAMIN W/MINERALS CH
1.0000 | ORAL_TABLET | Freq: Every day | ORAL | Status: DC
  Administered 2017-04-18 – 2017-04-20 (×3): 1 via ORAL
  Filled 2017-04-17 (×3): qty 1

## 2017-04-17 MED ORDER — PHENYLEPHRINE HCL 10 MG/ML IJ SOLN
INTRAMUSCULAR | Status: DC | PRN
  Administered 2017-04-17: 100 ug via INTRAVENOUS

## 2017-04-17 MED ORDER — VITAMIN B-12 1000 MCG PO TABS
1000.0000 ug | ORAL_TABLET | Freq: Every day | ORAL | Status: DC
  Administered 2017-04-18 – 2017-04-20 (×3): 1000 ug via ORAL
  Filled 2017-04-17 (×3): qty 1

## 2017-04-17 MED ORDER — SCOPOLAMINE 1 MG/3DAYS TD PT72
MEDICATED_PATCH | TRANSDERMAL | Status: AC
  Administered 2017-04-17: 1.5 mg via TRANSDERMAL
  Filled 2017-04-17: qty 1

## 2017-04-17 MED ORDER — PHENOL 1.4 % MT LIQD
1.0000 | OROMUCOSAL | Status: DC | PRN
  Filled 2017-04-17: qty 177

## 2017-04-17 MED ORDER — CEFAZOLIN SODIUM-DEXTROSE 2-4 GM/100ML-% IV SOLN: 2.0000 g | Freq: Four times a day (QID) | INTRAVENOUS | Status: DC

## 2017-04-17 MED ORDER — LACTATED RINGERS IV SOLN
INTRAVENOUS | Status: DC
  Administered 2017-04-17 (×2): via INTRAVENOUS

## 2017-04-17 MED ORDER — BUPIVACAINE HCL (PF) 0.5 % IJ SOLN
INTRAMUSCULAR | Status: DC | PRN
Start: 2017-04-17 — End: 2017-04-17
  Administered 2017-04-17: 2.4 mL via INTRATHECAL

## 2017-04-17 MED ORDER — BUPIVACAINE LIPOSOME 1.3 % IJ SUSP
INTRAMUSCULAR | Status: AC
  Filled 2017-04-17: qty 20

## 2017-04-17 MED ORDER — SCOPOLAMINE 1 MG/3DAYS TD PT72
1.0000 | MEDICATED_PATCH | Freq: Once | TRANSDERMAL | Status: AC
  Administered 2017-04-17: 1.5 mg via TRANSDERMAL

## 2017-04-17 MED ORDER — MIDAZOLAM HCL 2 MG/2ML IJ SOLN
INTRAMUSCULAR | Status: AC
  Filled 2017-04-17: qty 2

## 2017-04-17 MED ORDER — OXYCODONE HCL 5 MG PO TABS: 10.0000 mg | ORAL_TABLET | ORAL | Status: DC | PRN

## 2017-04-17 MED ORDER — ACETAMINOPHEN 10 MG/ML IV SOLN
1000.0000 mg | Freq: Four times a day (QID) | INTRAVENOUS | Status: AC
  Administered 2017-04-17 – 2017-04-18 (×2): 1000 mg via INTRAVENOUS
  Filled 2017-04-17 (×4): qty 100

## 2017-04-17 MED ORDER — CELECOXIB 200 MG PO CAPS
200.0000 mg | ORAL_CAPSULE | Freq: Two times a day (BID) | ORAL | Status: DC
  Administered 2017-04-17 – 2017-04-20 (×6): 200 mg via ORAL
  Filled 2017-04-17 (×6): qty 1

## 2017-04-17 MED ORDER — NEOMYCIN-POLYMYXIN B GU 40-200000 IR SOLN
Status: AC
  Filled 2017-04-17: qty 20

## 2017-04-17 MED ORDER — MAGNESIUM OXIDE 400 (241.3 MG) MG PO TABS
400.0000 mg | ORAL_TABLET | Freq: Two times a day (BID) | ORAL | Status: DC
  Administered 2017-04-17 – 2017-04-20 (×6): 400 mg via ORAL
  Filled 2017-04-17 (×6): qty 1

## 2017-04-17 MED ORDER — FENTANYL CITRATE (PF) 100 MCG/2ML IJ SOLN
INTRAMUSCULAR | Status: AC
  Filled 2017-04-17: qty 2

## 2017-04-17 MED ORDER — ALUM & MAG HYDROXIDE-SIMETH 200-200-20 MG/5ML PO SUSP: 30.0000 mL | ORAL | Status: DC | PRN

## 2017-04-17 MED ORDER — MIDAZOLAM HCL 5 MG/5ML IJ SOLN
INTRAMUSCULAR | Status: DC | PRN
  Administered 2017-04-17: 1 mg via INTRAVENOUS
  Administered 2017-04-17: 2 mg via INTRAVENOUS
  Administered 2017-04-17: 1 mg via INTRAVENOUS

## 2017-04-17 MED ORDER — BISACODYL 10 MG RE SUPP
10.0000 mg | Freq: Every day | RECTAL | Status: DC | PRN
  Administered 2017-04-19: 10 mg via RECTAL
  Filled 2017-04-17: qty 1

## 2017-04-17 MED ORDER — GLYCOPYRROLATE 0.2 MG/ML IJ SOLN
INTRAMUSCULAR | Status: AC
  Filled 2017-04-17: qty 1

## 2017-04-17 MED ORDER — TRANEXAMIC ACID 1000 MG/10ML IV SOLN
1000.0000 mg | Freq: Once | INTRAVENOUS | Status: AC
  Administered 2017-04-17: 1000 mg via INTRAVENOUS
  Filled 2017-04-17: qty 10

## 2017-04-17 MED ORDER — MAGNESIUM HYDROXIDE 400 MG/5ML PO SUSP
30.0000 mL | Freq: Every day | ORAL | Status: DC | PRN
  Administered 2017-04-18: 30 mL via ORAL
  Filled 2017-04-17: qty 30

## 2017-04-17 MED ORDER — LORATADINE 10 MG PO TABS
10.0000 mg | ORAL_TABLET | Freq: Every day | ORAL | Status: DC
  Administered 2017-04-17 – 2017-04-20 (×4): 10 mg via ORAL
  Filled 2017-04-17 (×4): qty 1

## 2017-04-17 SURGICAL SUPPLY — 66 items
BATTERY INSTRU NAVIGATION (MISCELLANEOUS) ×8 IMPLANT
BLADE CLIPPER SURG (BLADE) ×2 IMPLANT
BLADE SAW 1 (BLADE) ×2 IMPLANT
BLADE SAW 1/2 (BLADE) ×2 IMPLANT
BLADE SAW 70X12.5 (BLADE) IMPLANT
CANISTER SUCT 1200ML W/VALVE (MISCELLANEOUS) ×2 IMPLANT
CANISTER SUCT 3000ML PPV (MISCELLANEOUS) ×4 IMPLANT
CAPT KNEE TOTAL 3 ATTUNE ×2 IMPLANT
CEMENT HV SMART SET (Cement) ×4 IMPLANT
COOLER POLAR GLACIER W/PUMP (MISCELLANEOUS) ×2 IMPLANT
CUFF TOURN 24 STER (MISCELLANEOUS) IMPLANT
CUFF TOURN 30 STER DUAL PORT (MISCELLANEOUS) ×2 IMPLANT
DRAPE SHEET LG 3/4 BI-LAMINATE (DRAPES) ×2 IMPLANT
DRSG DERMACEA 8X12 NADH (GAUZE/BANDAGES/DRESSINGS) ×2 IMPLANT
DRSG OPSITE POSTOP 4X14 (GAUZE/BANDAGES/DRESSINGS) ×2 IMPLANT
DRSG TEGADERM 4X4.75 (GAUZE/BANDAGES/DRESSINGS) ×2 IMPLANT
DURAPREP 26ML APPLICATOR (WOUND CARE) ×4 IMPLANT
ELECT CAUTERY BLADE 6.4 (BLADE) ×2 IMPLANT
ELECT REM PT RETURN 9FT ADLT (ELECTROSURGICAL) ×2
ELECTRODE REM PT RTRN 9FT ADLT (ELECTROSURGICAL) ×1 IMPLANT
EVACUATOR 1/8 PVC DRAIN (DRAIN) ×2 IMPLANT
EX-PIN ORTHOLOCK NAV 4X150 (PIN) ×4 IMPLANT
GLOVE BIOGEL M STRL SZ7.5 (GLOVE) ×4 IMPLANT
GLOVE BIOGEL PI IND STRL 7.0 (GLOVE) ×4 IMPLANT
GLOVE BIOGEL PI IND STRL 9 (GLOVE) ×1 IMPLANT
GLOVE BIOGEL PI INDICATOR 7.0 (GLOVE) ×4
GLOVE BIOGEL PI INDICATOR 9 (GLOVE) ×1
GLOVE INDICATOR 8.0 STRL GRN (GLOVE) ×2 IMPLANT
GLOVE SURG SYN 9.0  PF PI (GLOVE) ×1
GLOVE SURG SYN 9.0 PF PI (GLOVE) ×1 IMPLANT
GOWN STRL REUS W/ TWL LRG LVL3 (GOWN DISPOSABLE) ×2 IMPLANT
GOWN STRL REUS W/TWL 2XL LVL3 (GOWN DISPOSABLE) ×2 IMPLANT
GOWN STRL REUS W/TWL LRG LVL3 (GOWN DISPOSABLE) ×2
HOLDER FOLEY CATH W/STRAP (MISCELLANEOUS) ×2 IMPLANT
HOOD PEEL AWAY FLYTE STAYCOOL (MISCELLANEOUS) ×4 IMPLANT
KIT RM TURNOVER STRD PROC AR (KITS) ×2 IMPLANT
KNIFE SCULPS 14X20 (INSTRUMENTS) ×2 IMPLANT
LABEL OR SOLS (LABEL) ×2 IMPLANT
NDL SAFETY ECLIPSE 18X1.5 (NEEDLE) ×1 IMPLANT
NEEDLE HYPO 18GX1.5 SHARP (NEEDLE) ×1
NEEDLE SPNL 20GX3.5 QUINCKE YW (NEEDLE) ×4 IMPLANT
NS IRRIG 500ML POUR BTL (IV SOLUTION) ×2 IMPLANT
PACK TOTAL KNEE (MISCELLANEOUS) ×2 IMPLANT
PAD WRAPON POLAR KNEE (MISCELLANEOUS) ×1 IMPLANT
PIN DRILL QUICK PACK ×2 IMPLANT
PIN FIXATION 1/8DIA X 3INL (PIN) ×2 IMPLANT
PULSAVAC PLUS IRRIG FAN TIP (DISPOSABLE) ×2
SOL .9 NS 3000ML IRR  AL (IV SOLUTION) ×1
SOL .9 NS 3000ML IRR UROMATIC (IV SOLUTION) ×1 IMPLANT
SOL PREP PVP 2OZ (MISCELLANEOUS) ×2
SOLUTION PREP PVP 2OZ (MISCELLANEOUS) ×1 IMPLANT
SPONGE DRAIN TRACH 4X4 STRL 2S (GAUZE/BANDAGES/DRESSINGS) ×2 IMPLANT
STAPLER SKIN PROX 35W (STAPLE) ×2 IMPLANT
STRAP TIBIA SHORT (MISCELLANEOUS) ×2 IMPLANT
SUCTION FRAZIER HANDLE 10FR (MISCELLANEOUS) ×1
SUCTION TUBE FRAZIER 10FR DISP (MISCELLANEOUS) ×1 IMPLANT
SUT VIC AB 0 CT1 36 (SUTURE) ×2 IMPLANT
SUT VIC AB 1 CT1 36 (SUTURE) ×4 IMPLANT
SUT VIC AB 2-0 CT2 27 (SUTURE) ×2 IMPLANT
SYR 20CC LL (SYRINGE) ×2 IMPLANT
SYR 30ML LL (SYRINGE) ×4 IMPLANT
TIP FAN IRRIG PULSAVAC PLUS (DISPOSABLE) ×1 IMPLANT
TOWEL OR 17X26 4PK STRL BLUE (TOWEL DISPOSABLE) ×2 IMPLANT
TOWER CARTRIDGE SMART MIX (DISPOSABLE) ×2 IMPLANT
TRAY FOLEY W/METER SILVER 16FR (SET/KITS/TRAYS/PACK) ×2 IMPLANT
WRAPON POLAR PAD KNEE (MISCELLANEOUS) ×2

## 2017-04-17 NOTE — Anesthesia Procedure Notes (Signed)
Spinal  Patient location during procedure: OR Staffing Anesthesiologist: Thomas, Mathai, MD Resident/CRNA: Rivan Siordia, CRNA Performed: resident/CRNA  Preanesthetic Checklist Completed: patient identified, site marked, surgical consent, pre-op evaluation, timeout performed, IV checked, risks and benefits discussed and monitors and equipment checked Spinal Block Patient position: sitting Prep: ChloraPrep and site prepped and draped Patient monitoring: heart rate, continuous pulse ox, blood pressure and cardiac monitor Approach: midline Location: L4-5 Injection technique: single-shot Needle Needle type: Introducer and Pencan  Needle gauge: 24 G Needle length: 9 cm Additional Notes Negative paresthesia. Negative blood return. Positive free-flowing CSF. Expiration date of kit checked and confirmed. Patient tolerated procedure well, without complications.       

## 2017-04-17 NOTE — Op Note (Signed)
OPERATIVE NOTE  DATE OF SURGERY:  04/17/2017  PATIENT NAME:  Amanda Browning   DOB: 08-24-1945  MRN: [id number]  PRE-OPERATIVE DIAGNOSIS: Degenerative arthrosis of the right knee, primary  POST-OPERATIVE DIAGNOSIS:  Same  PROCEDURE:  Right total knee arthroplasty using computer-assisted navigation  SURGEON:  Marciano Sequin. M.D.  ASSISTANT:  Vance Peper, PA (present and scrubbed throughout the case, critical for assistance with exposure, retraction, instrumentation, and closure)  ANESTHESIA: spinal  ESTIMATED BLOOD LOSS: 100 mL  FLUIDS REPLACED: 1500 mL of crystalloid  TOURNIQUET TIME: 81 minutes  DRAINS: 2 medium Hemovac drains  SOFT TISSUE RELEASES: Anterior cruciate ligament, posterior cruciate ligament, deep medial collateral ligament, patellofemoral ligament  IMPLANTS UTILIZED: DePuy Attune size 5 posterior stabilized femoral component (cemented), size 5 rotating platform tibial component (cemented), 35 mm medialized dome patella (cemented), and a 5 mm stabilized rotating platform polyethylene insert.  INDICATIONS FOR SURGERY: Amanda Browning is a 71 y.o. year old female with a long history of progressive knee pain. X-rays demonstrated severe degenerative changes in tricompartmental fashion. The patient had not seen any significant improvement despite conservative nonsurgical intervention. After discussion of the risks and benefits of surgical intervention, the patient expressed understanding of the risks benefits and agree with plans for total knee arthroplasty.   The risks, benefits, and alternatives were discussed at length including but not limited to the risks of infection, bleeding, nerve injury, stiffness, blood clots, the need for revision surgery, cardiopulmonary complications, among others, and they were willing to proceed.  PROCEDURE IN DETAIL: The patient was brought into the operating room and, after adequate spinal anesthesia was achieved, a tourniquet was  placed on the patient's upper thigh. The patient's knee and leg were cleaned and prepped with alcohol and DuraPrep and draped in the usual sterile fashion. A "timeout" was performed as per usual protocol. The lower extremity was exsanguinated using an Esmarch, and the tourniquet was inflated to 300 mmHg. An anterior longitudinal incision was made followed by a standard mid vastus approach. The deep fibers of the medial collateral ligament were elevated in a subperiosteal fashion off of the medial flare of the tibia so as to maintain a continuous soft tissue sleeve. The patella was subluxed laterally and the patellofemoral ligament was incised. Inspection of the knee demonstrated severe degenerative changes with full-thickness loss of articular cartilage. Osteophytes were debrided using a rongeur. Anterior and posterior cruciate ligaments were excised. Two 4.0 mm Schanz pins were inserted in the femur and into the tibia for attachment of the array of trackers used for computer-assisted navigation. Hip center was identified using a circumduction technique. Distal landmarks were mapped using the computer. The distal femur and proximal tibia were mapped using the computer. The distal femoral cutting guide was positioned using computer-assisted navigation so as to achieve a 5 distal valgus cut. The femur was sized and it was felt that a size 5 femoral component was appropriate. A size 5 femoral cutting guide was positioned and the anterior cut was performed and verified using the computer. This was followed by completion of the posterior and chamfer cuts. Femoral cutting guide for the central box was then positioned in the center box cut was performed.  Attention was then directed to the proximal tibia. Medial and lateral menisci were excised. The extramedullary tibial cutting guide was positioned using computer-assisted navigation so as to achieve a 0 varus-valgus alignment and 3 posterior slope. The cut was  performed and verified using the computer. The proximal tibia  was sized and it was felt that a size 5 tibial tray was appropriate. Tibial and femoral trials were inserted followed by insertion of a 5 mm polyethylene insert. This allowed for excellent mediolateral soft tissue balancing both in flexion and in full extension. Finally, the patella was cut and prepared so as to accommodate a 35 mm medialized dome patella. A patella trial was placed and the knee was placed through a range of motion with excellent patellar tracking appreciated. The femoral trial was removed after debridement of posterior osteophytes. The central post-hole for the tibial component was reamed followed by insertion of a keel punch. Tibial trials were then removed. Cut surfaces of bone were irrigated with copious amounts of normal saline with antibiotic solution using pulsatile lavage and then suctioned dry. Polymethylmethacrylate cement was prepared in the usual fashion using a vacuum mixer. Cement was applied to the cut surface of the proximal tibia as well as along the undersurface of a size 5 rotating platform tibial component. Tibial component was positioned and impacted into place. Excess cement was removed using [employer] fast streamer. Cement was then applied to the cut surfaces of the femur as well as along the posterior flanges of the size 5 femoral component. The femoral component was positioned and impacted into place. Excess cement was removed using [employer] fast streamer. A 5 mm polyethylene trial was inserted and the knee was brought into full extension with steady axial compression applied. Finally, cement was applied to the backside of a 35 mm medialized dome patella and the patellar component was positioned and patellar clamp applied. Excess cement was removed using [employer] fast streamer. After adequate curing of the cement, the tourniquet was deflated after a total tourniquet time of 81 minutes. Hemostasis was achieved using electrocautery. The  knee was irrigated with copious amounts of normal saline with antibiotic solution using pulsatile lavage and then suctioned dry. 20 mL of 1.3% Exparel and 60 mL of 0.25% Marcaine in 40 mL of normal saline was injected along the posterior capsule, medial and lateral gutters, and along the arthrotomy site. A 5 mm stabilized rotating platform polyethylene insert was inserted and the knee was placed through a range of motion with excellent mediolateral soft tissue balancing appreciated and excellent patellar tracking noted. 2 medium drains were placed in the wound bed and brought out through separate stab incisions. The medial parapatellar portion of the incision was reapproximated using interrupted sutures of #1 Vicryl. Subcutaneous tissue was approximated in layers using first #0 Vicryl followed #2-0 Vicryl. The skin was approximated with skin staples. A sterile dressing was applied.  The patient tolerated the procedure well and was transported to the recovery room in stable condition.    Xcaret Morad P. Holley Bouche., M.D.

## 2017-04-17 NOTE — Anesthesia Preprocedure Evaluation (Addendum)
Anesthesia Evaluation  Patient identified by MRN, date of birth, ID band Patient awake    Reviewed: Allergy & Precautions, NPO status , Patient's Chart, lab work & pertinent test results, reviewed documented beta blocker date and time   History of Anesthesia Complications (+) PONV and history of anesthetic complications  Airway Mallampati: III  TM Distance: >3 FB     Dental  (+) Chipped   Pulmonary           Cardiovascular hypertension, Pt. on medications      Neuro/Psych  Headaches,    GI/Hepatic GERD  Controlled,  Endo/Other  Hypothyroidism   Renal/GU      Musculoskeletal  (+) Arthritis ,   Abdominal   Peds  Hematology  (+) anemia ,   Anesthesia Other Findings Obese.No cardiac symptoms.Sensitive to our meds.  Reproductive/Obstetrics                            Anesthesia Physical Anesthesia Plan  ASA: III  Anesthesia Plan: Spinal   Post-op Pain Management:    Induction:   PONV Risk Score and Plan:   Airway Management Planned:   Additional Equipment:   Intra-op Plan:   Post-operative Plan:   Informed Consent: I have reviewed the patients History and Physical, chart, labs and discussed the procedure including the risks, benefits and alternatives for the proposed anesthesia with the patient or authorized representative who has indicated his/her understanding and acceptance.     Plan Discussed with: CRNA  Anesthesia Plan Comments:         Anesthesia Quick Evaluation

## 2017-04-17 NOTE — H&P (Signed)
The patient has been re-examined, and the chart reviewed, and there have been no interval changes to the documented history and physical.    The risks, benefits, and alternatives have been discussed at length. The patient expressed understanding of the risks benefits and agreed with plans for surgical intervention.  Takao Lizer P. Caulder Wehner, Jr. M.D.    

## 2017-04-17 NOTE — Anesthesia Post-op Follow-up Note (Signed)
Anesthesia QCDR form completed.        

## 2017-04-17 NOTE — Transfer of Care (Signed)
Immediate Anesthesia Transfer of Care Note  Patient: Amanda Browning  Procedure(s) Performed: Procedure(s): COMPUTER ASSISTED TOTAL KNEE ARTHROPLASTY (Right)  Patient Location: PACU  Anesthesia Type:General  Level of Consciousness: sedated  Airway & Oxygen Therapy: Patient Spontanous Breathing and Patient connected to face mask oxygen  Post-op Assessment: Report given to RN and Post -op Vital signs reviewed and stable  Post vital signs: Reviewed and stable  Last Vitals:  Vitals:   04/17/17 1257 04/17/17 1709  BP: (!) 183/99 110/60  Pulse:  70  Resp:  17  Temp:    SpO2:  254%    Complications: No apparent anesthesia complications

## 2017-04-18 MED ORDER — OXYCODONE HCL 5 MG PO TABS: 5.0000 mg | ORAL_TABLET | ORAL | 0 refills | Status: DC | PRN

## 2017-04-18 MED ORDER — TRAMADOL HCL 50 MG PO TABS: 50.0000 mg | ORAL_TABLET | ORAL | 0 refills | Status: DC | PRN

## 2017-04-18 MED ORDER — ENOXAPARIN SODIUM 30 MG/0.3ML ~~LOC~~ SOLN: 30.0000 mg | Freq: Two times a day (BID) | SUBCUTANEOUS | 0 refills | Status: DC

## 2017-04-18 NOTE — Progress Notes (Signed)
Patient in minimal pain last night. Improved with tramadol. Patient dangled to side of bed. Tolerated well.

## 2017-04-18 NOTE — Discharge Summary (Signed)
Physician Discharge Summary  Patient ID: Amanda Browning MRN: [id number] DOB/AGE: 01/15/1946 72 y.o.  Admit date: 04/17/2017 Discharge date: 04/20/2016  Admission Diagnoses:  PRIMARY OSTEOARTHRITIS OF RIGHT KNEE   Discharge Diagnoses: Patient Active Problem List   Diagnosis Date Noted  . 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 Primary osteoarthritis of right knee 01/08/2017  . Right elbow pain 04/03/2016  . Stress 04/20/2015  . Health care maintenance 03/19/2015  . Difficulty sleeping 12/14/2014  . Fatigue 10/07/2014  . SOB (shortness of breath) on exertion 10/07/2014  . Skin lesion 10/07/2014  . Estrogen deficiency 01/23/2014  . 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 Vaginitis 10/21/2013  . Leg cramps 03/19/2013  . URI (upper respiratory infection) 03/05/2013  . Pain, neck 02/05/2013  . Low back pain 01/15/2013  . Right knee pain 01/15/2013  . Hypertension 03/19/2012  . Hypercholesterolemia 03/19/2012  . Anemia 03/19/2012  . Hypothyroidism 03/19/2012  . GERD (gastroesophageal reflux disease) 03/19/2012    Past Medical History:  Diagnosis Date  . Colon polyps   . Complication of anesthesia    doesn't take much anesthesia to put pt to sleep, slow to wake up  . DDD (degenerative disc disease), cervical 02/05/13   C5-C6 and C6-C7  . GERD (gastroesophageal reflux disease)   . History of chicken pox   . Hypercholesterolemia   . Hypertension   . Hypothyroidism   . Migraines   . PONV (postoperative nausea and vomiting)      Transfusion: No transfusions during this admission   Consultants (if any):   Discharged Condition: Improved  Hospital Course: [name] is an 72 y.o. female who was admitted 04/17/2017 with a diagnosis of degenerative arthrosis right knee and went to the operating room on 04/17/2017 and underwent the above named procedures.    Surgeries:Procedure(s): COMPUTER ASSISTED TOTAL KNEE ARTHROPLASTY on  04/17/2017  PRE-OPERATIVE DIAGNOSIS: Degenerative arthrosis of the right knee, primary  POST-OPERATIVE DIAGNOSIS:  Same  PROCEDURE:  Right total knee arthroplasty using computer-assisted navigation  SURGEON:  Marciano Sequin. M.D.  ASSISTANT:  Vance Peper, PA (present and scrubbed throughout the case, critical for assistance with exposure, retraction, instrumentation, and closure)  ANESTHESIA: spinal  ESTIMATED BLOOD LOSS: 100 mL  FLUIDS REPLACED: 1500 mL of crystalloid  TOURNIQUET TIME: 81 minutes  DRAINS: 2 medium Hemovac drains  SOFT TISSUE RELEASES: Anterior cruciate ligament, posterior cruciate ligament, deep medial collateral ligament, patellofemoral ligament  IMPLANTS UTILIZED: DePuy Attune size 5 posterior stabilized femoral component (cemented), size 5 rotating platform tibial component (cemented), 35 mm medialized dome patella (cemented), and a 5 mm stabilized rotating platform polyethylene insert.  INDICATIONS FOR SURGERY: Amanda Browning is a 72 y.o. year old female with a long history of progressive knee pain. X-rays demonstrated severe degenerative changes in tricompartmental fashion. The patient had not seen any significant improvement despite conservative nonsurgical intervention. After discussion of the risks and benefits of surgical intervention, the patient expressed understanding of the risks benefits and agree with plans for total knee arthroplasty.   The risks, benefits, and alternatives were discussed at length including but not limited to the risks of infection, bleeding, nerve injury, stiffness, blood clots, the need for revision surgery, cardiopulmonary complications, among others, and they were willing to proceed.   Patient tolerated the surgery well. No complications .Patient was taken to PACU where she was stabilized and then transferred to the orthopedic floor.  Patient started on Lovenox 30 mg q 12 hrs. Foot pumps  applied bilaterally at  80 mm hgb. Heels elevated off bed with rolled towels. No evidence of DVT. Calves non tender. Negative Homan. Physical therapy started on day #1 for gait training and transfer with OT starting on  day #1 for ADL and assisted devices. Patient has done well with therapy. Ambulated less than 100 feet upon being discharged.  Patient's IV And Foley were discontinued on day #1 with Hemovac being discontinued on day #2. Dressing was changed on day 2 prior to patient being discharged   She was given perioperative antibiotics:  Anti-infectives (From admission, onward)   Start     Dose/Rate Route Frequency Ordered Stop   04/17/17 2000  ceFAZolin (ANCEF) 2 g in dextrose 5 % 100 mL IVPB     2 g 200 mL/hr over 30 Minutes Intravenous Every 6 hours 04/17/17 1851 04/18/17 1959   04/17/17 1845  ceFAZolin (ANCEF) IVPB 2g/100 mL premix  Status:  Discontinued     2 g 200 mL/hr over 30 Minutes Intravenous Every 6 hours 04/17/17 1841 04/17/17 1850   04/17/17 1257  ceFAZolin (ANCEF) 2-4 GM/100ML-% IVPB    Comments:  Dewayne Hatch   : cabinet override      04/17/17 1257 04/18/17 0114   04/17/17 0600  ceFAZolin (ANCEF) IVPB 2g/100 mL premix  Status:  Discontinued     2 g 200 mL/hr over 30 Minutes Intravenous On call to O.R. 04/16/17 2247 04/17/17 1248    .  She was fitted with AV 1 compression foot pump devices, instructed on heel pumps, early ambulation, and fitted with TED stockings bilaterally for DVT prophylaxis.  She benefited maximally from the hospital stay and there were no complications.    Recent vital signs:  Vitals:   04/18/17 0801 04/18/17 0830  BP: (!) 156/74 (!) 161/75  Pulse: 65   Resp: 20   Temp: 98.2 F (36.8 C)   SpO2: 97%     Recent laboratory studies:  Lab Results  Component Value Date   HGB 13.6 04/05/2017   HGB 13.0 01/20/2017   HGB 13.3 10/02/2015   Lab Results  Component Value Date   WBC 5.9 04/05/2017   PLT 214 04/05/2017   Lab Results  Component Value Date    INR 0.99 04/05/2017   Lab Results  Component Value Date   NA 139 04/05/2017   K 3.5 04/05/2017   CL 101 04/05/2017   CO2 31 04/05/2017   BUN 16 04/05/2017   CREATININE 0.85 04/05/2017   GLUCOSE 108 (H) 04/05/2017    Discharge Medications:   Allergies as of 04/18/2017      Reactions   Codeine Other (See Comments)   Can not function      Medication List    STOP taking these medications   aspirin 81 MG tablet     TAKE these medications   acetaminophen 650 MG CR tablet Commonly known as:  TYLENOL Take 650-1,300 mg by mouth every 8 (eight) hours as needed for pain.   cetirizine 10 MG tablet Commonly known as:  ZYRTEC Take 1 tablet (10 mg total) by mouth daily. What changed:  when to take this   diphenhydrAMINE 25 mg capsule Commonly known as:  BENADRYL Take 25 mg by mouth as needed for allergies (bee stings).   enoxaparin 30 MG/0.3ML injection Commonly known as:  LOVENOX Inject 0.3 mLs (30 mg total) into the skin every 12 (twelve) hours. Start taking on:  04/20/2017   GLUCOSAMINE-CHONDROITIN PO Take 1 tablet by mouth  daily.   levothyroxine 25 MCG tablet Commonly known as:  SYNTHROID, LEVOTHROID TAKE 1 TABLET EVERY DAY BEFORE BREAKFAST What changed:  See the new instructions.   lisinopril-hydrochlorothiazide 20-25 MG tablet Commonly known as:  PRINZIDE,ZESTORETIC Take 1 tablet by mouth daily.   lovastatin 40 MG tablet Commonly known as:  MEVACOR TAKE 1 TABLET AT BEDTIME What changed:    how much to take  how to take this  when to take this   magnesium oxide 400 MG tablet Commonly known as:  MAG-OX Take 1 tablet (400 mg total) by mouth 2 (two) times daily.   multivitamin capsule Take 1 capsule by mouth daily.   omeprazole 20 MG capsule Commonly known as:  PRILOSEC TAKE 1 CAPSULE EVERY DAY What changed:    how much to take  how to take this  when to take this   oxyCODONE 5 MG immediate release tablet Commonly known as:  Oxy  IR/ROXICODONE Take 1 tablet (5 mg total) by mouth every 3 (three) hours as needed for moderate pain ((score 4 to 6)).   traMADol 50 MG tablet Commonly known as:  ULTRAM Take 1-2 tablets (50-100 mg total) by mouth every 4 (four) hours as needed for moderate pain.   vitamin B-12 1000 MCG tablet Commonly known as:  CYANOCOBALAMIN Take 1 tablet by mouth daily.   Vitamin D3 1000 units Caps Take 1 capsule by mouth daily.            Durable Medical Equipment  (From admission, onward)        Start     Ordered   04/17/17 1838  DME Walker rolling  Once    Question:  Patient needs a walker to treat with the following condition  Answer:  Total knee replacement status   04/17/17 1841   04/17/17 1838  DME Bedside commode  Once    Question:  Patient needs a bedside commode to treat with the following condition  Answer:  Total knee replacement status   04/17/17 1841      Diagnostic Studies: Dg Knee Right Port  Result Date: 04/17/2017 CLINICAL DATA:  Right knee replacement. EXAM: PORTABLE RIGHT KNEE - 1-2 VIEW COMPARISON:  Two-view right knee radiographs 10/02/2015. FINDINGS: Right total knee arthroplasty is noted. Knee is located. A surgical drain is in place. Joint space is symmetric. IMPRESSION: Right knee arthroplasty without radiographic evidence for complication. Electronically Signed   By: San Morelle M.D.   On: 04/17/2017 17:44   Mm Digital Screening Bilateral  Result Date: 03/22/2017 CLINICAL DATA:  Screening. EXAM: DIGITAL SCREENING BILATERAL MAMMOGRAM WITH CAD COMPARISON:  Previous exam(s). ACR Breast Density Category b: There are scattered areas of fibroglandular density. FINDINGS: There are no findings suspicious for malignancy. Images were processed with CAD. IMPRESSION: No mammographic evidence of malignancy. A result letter of this screening mammogram will be mailed directly to the patient. RECOMMENDATION: Screening mammogram in one year. (Code:SM-B-01Y) BI-RADS  CATEGORY  1: Negative. Electronically Signed   By: Kristopher Oppenheim M.D.   On: 03/22/2017 14:06    Disposition: Final discharge disposition not confirmed    Follow-up Information    Watt Climes, PA On 05/02/2017.   Specialty:  Physician Assistant Why:  at 2:15pm Contact information: [place] [address] [phone]        Dereck Leep, MD On 06/01/2017.   Specialty:  Orthopedic Surgery Why:  at 10:45am Contact information: [address]  [place] [address] [phone]            Signed: Watt Climes 04/18/2017, 9:36 AM

## 2017-04-18 NOTE — Clinical Social Work Note (Signed)
Clinical Social Work Assessment  Patient Details  Name: Amanda Browning MRN: [id number] Date of Birth: 1945/08/16  Date of referral:  04/18/17               Reason for consult:  Facility Placement                Permission sought to share information with:  Chartered certified accountant granted to share information::  Yes, Verbal Permission Granted  Name::      Amanda [facility]ning::   [facility]   Relationship::     Contact Information:     Housing/Transportation Living arrangements for Amanda past 2 months:  Railroad of Information:  Patient Patient Interpreter Needed:  None Criminal Activity/Legal Involvement Pertinent to Current Situation/Hospitalization:  No - Comment as needed Significant Relationships:  Adult Children Lives with:  Self Do you feel safe going back to Amanda place where you live?  Yes Need for family participation in patient care:  Yes (Comment)  Care giving concerns:  Patient lives in [place] alone.    Social Worker assessment / plan:  Holiday representative (CSW) received SNF consult. PT is recommending SNF. CSW met with patient alone at bedside to discuss D/C plan. Patient was alert and oriented X4 and was sitting up in Amanda chair at bedside. CSW introduced self and explained role of CSW department. Patient reported that she lives alone in [place] and does not have a HPOA. Per patient her 2 adult daughters live near by and provide support. CSW explained SNF process and that [facility] will have to approve SNF. CSW explained that [facility] is closed today for [facility] and SNF authorization can't be started until tomorrow. Patient verbalized her understanding and is agreeable to SNF search in [place]. FL2 complete and faxed out.   CSW presented bed offers to patient and she chose [facility]. Patient is okay with a semi-private room. Per [facility] admissions coordinator at [facility] she will start  [place] authorization for SNF tomorrow. CSW will continue to follow and assist as needed.   Employment status:  Retired Nurse, adult PT Recommendations:  [place] [facility] / Referral to community resources:  [place]  Patient/Family's Response to care:  Patient is agreeable to D/C to [facility].   Patient/Family's Understanding of and Emotional Response to Diagnosis, Current Treatment, and Prognosis:  Patient was very pleasant and thanked CSW for assistance.   Emotional Assessment Appearance:  Appears stated age Attitude/Demeanor/Rapport:    Affect (typically observed):  Accepting, Adaptable, Pleasant Orientation:  Oriented to Self, Oriented to Place, Oriented to  Time, Oriented to Situation Alcohol / Substance use:  Not Applicable Psych involvement (Current and /or in Amanda community):  No (Comment)  Discharge Needs  Concerns to be addressed:  Discharge Planning Concerns Readmission within Amanda last 30 days:  No Current discharge risk:  Dependent with Mobility Barriers to Discharge:  Continued Medical Work up   [employer], Amanda Beets, LCSW 04/18/2017, 2:01 PM

## 2017-04-18 NOTE — Clinical Social Work Placement (Signed)
   CLINICAL SOCIAL WORK PLACEMENT  NOTE  Date:  04/18/2017  Patient Details  Name: Amanda Browning MRN: [id number] Date of Birth: Jan 16, 1946  Clinical Social Work is seeking post-discharge placement for this patient at the [facility] level of care (*CSW will initial, date and re-position this form in  chart as items are completed):  Yes   Patient/family provided with [facility] Work Department's list of facilities offering this level of care within the geographic area requested by the patient (or if unable, by the patient's family).  Yes   Patient/family informed of their freedom to choose among providers that offer the needed level of care, that participate in Medicare, Medicaid or managed care program needed by the patient, have an available bed and are willing to accept the patient.  Yes   Patient/family informed of [facility]'s ownership interest in [facility] and [facility], as well as of the fact that they are under no obligation to receive care at these facilities.  PASRR submitted to EDS on 04/18/17     PASRR number received on 04/18/17     Existing PASRR number confirmed on       FL2 transmitted to all facilities in geographic area requested by pt/family on 04/18/17     FL2 transmitted to all facilities within larger geographic area on       Patient informed that his/her managed care company has contracts with or will negotiate with certain facilities, including the following:        Yes   Patient/family informed of bed offers received.  Patient chooses bed at [facility] )     Physician recommends and patient chooses bed at      Patient to be transferred to   on  .  Patient to be transferred to facility by       Patient family notified on   of transfer.  Name of family member notified:        PHYSICIAN       Additional Comment:    _______________________________________________ Andres Bantz, Veronia Beets,  LCSW 04/18/2017, 2:01 PM

## 2017-04-18 NOTE — Progress Notes (Signed)
   Subjective: 1 Day Post-Op Procedure(s) (LRB): COMPUTER ASSISTED TOTAL KNEE ARTHROPLASTY (Right) Patient reports pain as moderate.   Patient is well, and has had no acute complaints or problems We will start therapy today.  Plan is to go Rehab after hospital stay. no nausea and no vomiting Patient denies any chest pains or shortness of breath. Objective: Vital signs in last 24 hours: Temp:  [97.6 F (36.4 C)-98.4 F (36.9 C)] 98.2 F (36.8 C) (01/01 0801) Pulse Rate:  [60-80] 65 (01/01 0801) Resp:  [14-20] 20 (01/01 0801) BP: (110-183)/(60-99) 161/75 (01/01 0830) SpO2:  [94 %-100 %] 97 % (01/01 0801) Weight:  [96.2 kg (212 lb)-96.2 kg (212 lb 0 oz)] 96.2 kg (212 lb 0 oz) (12/31 1856) Heels are non tender and elevated off the bed using rolled towels as well as bone foam under operative leg  Intake/Output from previous day: 12/31 0701 - 01/01 0700 In: 1050 [I.V.:1050] Out: 1305 [Urine:1005; Drains:200; Blood:100] Intake/Output this shift: No intake/output data recorded.  No results for input(s): HGB in the last 72 hours. No results for input(s): WBC, RBC, HCT, PLT in the last 72 hours. No results for input(s): NA, K, CL, CO2, BUN, CREATININE, GLUCOSE, CALCIUM in the last 72 hours. No results for input(s): LABPT, INR in the last 72 hours.  EXAM General - Patient is Alert, Appropriate and Oriented Extremity - Neurologically intact Neurovascular intact Sensation intact distally Intact pulses distally Dorsiflexion/Plantar flexion intact Compartment soft Dressing - dressing C/D/I Motor Function - intact, moving foot and toes well on exam.    Past Medical History:  Diagnosis Date  . Colon polyps   . Complication of anesthesia    doesn't take much anesthesia to put pt to sleep, slow to wake up  . DDD (degenerative disc disease), cervical 02/05/13   C5-C6 and C6-C7  . GERD (gastroesophageal reflux disease)   . History of chicken pox   . Hypercholesterolemia   .  Hypertension   . Hypothyroidism   . Migraines   . PONV (postoperative nausea and vomiting)     Assessment/Plan: 1 Day Post-Op Procedure(s) (LRB): COMPUTER ASSISTED TOTAL KNEE ARTHROPLASTY (Right) Active Problems:   S/P total knee arthroplasty  Estimated body mass index is 36.97 kg/m as calculated from the following:   Height as of this encounter: 5' 3.5" (1.613 m).   Weight as of this encounter: 96.2 kg (212 lb 0 oz). Advance diet Up with therapy D/C IV fluids Discharge to SNF  Labs: None DVT Prophylaxis - Lovenox, Foot Pumps and TED hose Weight-Bearing as tolerated to right leg D/C O2 and Pulse OX and try on Room Air Begin working on bowel movement  Montrice Gracey [address] 04/18/2017, 8:56 AM

## 2017-04-18 NOTE — Evaluation (Signed)
Physical Therapy Evaluation Patient Details Name: Amanda Browning MRN: [id number] DOB: 1945-06-17 Today's Date: 04/18/2017   History of Present Illness  72 y/o female s/p R TKA  04/17/17.  Clinical Impression  Pt showed great effort with PT exam and though she initial had some pain hesitancy with ~25 combined minutes of exercises and gait training apart from the exam she ultimately did well.  Pt needed light assist with bed mobility, frequent encouragement and reinforcement to stay on task.  She showed good effort and ultimately did well with POD1 expectations.   Follow Up Recommendations SNF    Equipment Recommendations       Recommendations for Other Services       Precautions / Restrictions Precautions Precautions: Fall;Knee Restrictions RLE Weight Bearing: Weight bearing as tolerated      Mobility  Bed Mobility Overal bed mobility: Needs Assistance Bed Mobility: Supine to Sit     Supine to sit: Min assist;Min guard     General bed mobility comments: Pt showed good effort, needed light assist to get to sitting upright  Transfers Overall transfer level: Modified independent Equipment used: Rolling walker (2 wheeled)                Ambulation/Gait Ambulation/Gait assistance: Min assist Ambulation Distance (Feet): 40 Feet Assistive device: Rolling walker (2 wheeled)       General Gait Details: Pt initially very hesitant and slow, but able to increase cadence and comfort and generally showed good safety and fair confidence.  Stairs            Wheelchair Mobility    Modified Rankin (Stroke Patients Only)       Balance Overall balance assessment: Modified Independent                                           Pertinent Vitals/Pain Pain Assessment: 0-10 Pain Score: 6  Pain Location: R knee    Home Living Family/patient expects to be discharged to:: Skilled nursing facility Living Arrangements: Alone                     Prior Function Level of Independence: Independent with assistive device(s)(has been using cane secondary to knee pain)               Hand Dominance        Extremity/Trunk Assessment   Upper Extremity Assessment Upper Extremity Assessment: Overall WFL for tasks assessed    Lower Extremity Assessment Lower Extremity Assessment: Generalized weakness(expected R LE post-op weakness)       Communication   Communication: No difficulties  Cognition Arousal/Alertness: Awake/alert Behavior During Therapy: WFL for tasks assessed/performed Overall Cognitive Status: Within Functional Limits for tasks assessed                                        General Comments      Exercises Total Joint Exercises Ankle Circles/Pumps: Strengthening;10 reps Quad Sets: Strengthening;10 reps Gluteal Sets: Strengthening;10 reps Short Arc Quad: AROM;10 reps Heel Slides: AROM;AAROM;10 reps Hip ABduction/ADduction: Strengthening;10 reps Straight Leg Raises: AROM;10 reps Knee Flexion: PROM;5 reps Goniometric ROM: 0-82   Assessment/Plan    PT Assessment Patient needs continued PT services  PT Problem List Decreased strength;Decreased range of motion;Decreased activity tolerance;Decreased balance;Decreased mobility;Decreased  coordination;Decreased knowledge of use of DME;Decreased knowledge of precautions;Decreased safety awareness       PT Treatment Interventions Gait training;DME instruction;Stair training;Functional mobility training;Therapeutic activities;Therapeutic exercise;Balance training;Neuromuscular re-education;Patient/family education    PT Goals (Current goals can be found in the Care Plan section)  Acute Rehab PT Goals Patient Stated Goal: get stronger at rehab to be able to go home PT Goal Formulation: With patient/family Time For Goal Achievement: 05/02/17 Potential to Achieve Goals: Good    Frequency BID   Barriers to discharge         Co-evaluation               AM-PAC PT "6 Clicks" Daily Activity  Outcome Measure Difficulty turning over in bed (including adjusting bedclothes, sheets and blankets)?: A Little Difficulty moving from lying on back to sitting on the side of the bed? : Unable Difficulty sitting down on and standing up from a chair with arms (e.g., wheelchair, bedside commode, etc,.)?: A Lot Help needed moving to and from a bed to chair (including a wheelchair)?: A Little Help needed walking in hospital room?: A Little Help needed climbing 3-5 steps with a railing? : A Lot 6 Click Score: 14    End of Session Equipment Utilized During Treatment: Gait belt Activity Tolerance: Patient tolerated treatment well Patient left: with call bell/phone within reach;with chair alarm set Nurse Communication: Mobility status PT Visit Diagnosis: Muscle weakness (generalized) (M62.81);Difficulty in walking, not elsewhere classified (R26.2)    Time: 7096-2836 PT Time Calculation (min) (ACUTE ONLY): 52 min   Charges:   PT Evaluation $PT Eval Low Complexity: 1 Low PT Treatments $Gait Training: 8-22 mins $Therapeutic Exercise: 8-22 mins   PT G Codes:   PT G-Codes **NOT FOR INPATIENT CLASS** Functional Assessment Tool Used: AM-PAC 6 Clicks Basic Mobility Functional Limitation: Mobility: Walking and moving around Mobility: Walking and Moving Around Current Status (O2947): At least 40 percent but less than 60 percent impaired, limited or restricted Mobility: Walking and Moving Around Goal Status [phone]): At least 1 percent but less than 20 percent impaired, limited or restricted    Kreg Shropshire, DPT 04/18/2017, 3:42 PM

## 2017-04-18 NOTE — Evaluation (Signed)
Occupational Therapy Evaluation Patient Details Name: Amanda Browning MRN: [id number] DOB: Feb 22, 1946 Today's Date: 04/18/2017    History of Present Illness 72 y/o female s/p R TKA  04/17/17.   Clinical Impression   Pt seen for OT evaluation this date, POD #1 from R TKA. Pt was modified independent with mobility using a SPC, indep with ADL and IADL including driving and working part time prior to admission and pt eager to return to PLOF. Pt currently presents with pain, impaired strength/ROM in RLE, min assist for LB ADL tasks and min guard for mobility. Pt educated in AE/DME for bathing/dressing/toileting, polar care mgt, compression stocking mgt, and falls prevention strategies. Pt verbalized understanding. Pt will benefit from skilled OT services to address noted impairments and functional deficits in order to maximize return to PLOF and minimize risk of falls and injury. Recommend transition to STR following hospitalization in order to maximize safe eventual return home alone.    Follow Up Recommendations  SNF    Equipment Recommendations  None recommended by OT    Recommendations for Other Services       Precautions / Restrictions Precautions Precautions: Fall;Knee Restrictions Weight Bearing Restrictions: Yes RLE Weight Bearing: Weight bearing as tolerated      Mobility Bed Mobility     General bed mobility comments: deferred, up in recliner  Transfers Overall transfer level: Needs assistance Equipment used: Rolling walker (2 wheeled) Transfers: Sit to/from Stand Sit to Stand: Min guard              Balance Overall balance assessment: Modified Independent                                         ADL either performed or assessed with clinical judgement   ADL Overall ADL's : Needs assistance/impaired Eating/Feeding: Independent;Sitting   Grooming: Supervision/safety;Standing   Upper Body Bathing: Sitting;Set up   Lower Body Bathing: Sit  to/from stand;Minimal assistance   Upper Body Dressing : Sitting;Set up   Lower Body Dressing: Sit to/from stand;Minimal assistance Lower Body Dressing Details (indicate cue type and reason): pt educated in AE for LB dressing, compression stocking mgt Toilet Transfer: RW;Min guard;Regular Toilet;Minimal assistance;Ambulation;Grab bars Toilet Transfer Details (indicate cue type and reason): min guard and use of grab bar on R for stand>sit, min assist required to stand back up from regular toilet  Toileting- Clothing Manipulation and Hygiene: Sit to/from stand;Min guard       Functional mobility during ADLs: Min guard;Rolling walker       Vision Baseline Vision/History: Wears glasses Wears Glasses: At all times Patient Visual Report: No change from baseline       Perception     Praxis      Pertinent Vitals/Pain Pain Assessment: 0-10 Pain Score: 9  Pain Location: 6/10 at rest, increasing to 9/10 with mobility Pain Descriptors / Indicators: Aching;Burning;Grimacing Pain Intervention(s): Limited activity within patient's tolerance;Monitored during session;Repositioned;Ice applied;Patient requesting pain meds-RN notified     Hand Dominance     Extremity/Trunk Assessment Upper Extremity Assessment Upper Extremity Assessment: Overall WFL for tasks assessed   Lower Extremity Assessment Lower Extremity Assessment: Defer to PT evaluation;Generalized weakness   Cervical / Trunk Assessment Cervical / Trunk Assessment: Normal   Communication Communication Communication: No difficulties   Cognition Arousal/Alertness: Awake/alert Behavior During Therapy: WFL for tasks assessed/performed Overall Cognitive Status: Within Functional Limits for tasks assessed  General Comments       Exercises Other Exercises Other Exercises: pt educated in polar care mgt and falls prevention strategies to maximize safety in the home   Shoulder  Instructions      Home Living Family/patient expects to be discharged to:: Skilled nursing facility Living Arrangements: Alone                               Additional Comments: pt lives in 1 story home w/ 5 steps with L and R rails, garden tub and regular tub shower, comfort height toilets, has reacher and BSC       Prior Functioning/Environment Level of Independence: Independent with assistive device(s)        Comments: SPC for mobility, otherwise independent, driving, 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        OT Problem List: Decreased strength;Pain;Decreased range of motion;Decreased knowledge of use of DME or AE      OT Treatment/Interventions: Self-care/ADL training;Therapeutic exercise;Therapeutic activities;DME and/or AE instruction;Patient/family education    OT Goals(Current goals can be found in the care plan section) Acute Rehab OT Goals Patient Stated Goal: get stronger at rehab to be able to go home OT Goal Formulation: With patient Time For Goal Achievement: 05/02/17 Potential to Achieve Goals: Good ADL Goals Pt Will Perform Lower Body Dressing: with adaptive equipment;with modified independence;sit to/from stand Pt Will Transfer to Toilet: with modified independence;ambulating(RW for ambulation, comfort height toilet)  OT Frequency: Min 1X/week   Barriers to D/C: Inaccessible home environment;Decreased caregiver support          Co-evaluation              AM-PAC PT "6 Clicks" Daily Activity     Outcome Measure Help from another person eating meals?: None Help from another person taking care of personal grooming?: A Little Help from another person toileting, which includes using toliet, bedpan, or urinal?: A Little Help from another person bathing (including washing, rinsing, drying)?: A Little Help from another person to put on and taking off regular upper body clothing?: None Help from another person to put on and taking off regular  lower body clothing?: A Little 6 Click Score: 20   End of Session Equipment Utilized During Treatment: Gait belt;Rolling walker  Activity Tolerance: Patient tolerated treatment well Patient left: in chair;with call bell/phone within reach;with chair alarm set;with SCD's reapplied;Other (comment)(polar care in place)  OT Visit Diagnosis: Other abnormalities of gait and mobility (R26.89);Pain Pain - Right/Left: Right Pain - part of body: Knee                Time: 6789-3810 OT Time Calculation (min): 24 min Charges:  OT General Charges $OT Visit: 1 Visit OT Evaluation $OT Eval Low Complexity: 1 Low OT Treatments $Self Care/Home Management : 8-22 mins G-Codes: OT G-codes **NOT FOR INPATIENT CLASS** Functional Assessment Tool Used: AM-PAC 6 Clicks Daily Activity;Clinical judgement Functional Limitation: Self care Self Care Current Status (F7510): At least 20 percent but less than 40 percent impaired, limited or restricted Self Care Goal Status (C5852): At least 1 percent but less than 20 percent impaired, limited or restricted   Jeni Salles, MPH, MS, OTR/L ascom [phone] 04/18/17, 4:30 PM

## 2017-04-18 NOTE — NC FL2 (Signed)
[place] LEVEL OF CARE SCREENING TOOL     IDENTIFICATION  Patient Name: Amanda Browning Birthdate: March 17, 1946 Sex: female Admission Date (Current Location): 04/17/2017  [place] and [place] Number:  Engineering geologist and Address:  [facility], [address], [place], [place] [address]      Provider Number: [id number]  Attending Physician Name and Address:  Dereck Leep, MD  Relative Name and Phone Number:       Current Level of Care: Hospital Recommended Level of Care: [place] Prior Approval Number:    Date Approved/Denied:   PASRR Number:   [id number]   Discharge Plan: SNF    Current Diagnoses: Patient Active Problem List   Diagnosis Date Noted  . 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 Primary osteoarthritis of right knee 01/08/2017  . Right elbow pain 04/03/2016  . Stress 04/20/2015  . Health care maintenance 03/19/2015  . Difficulty sleeping 12/14/2014  . Fatigue 10/07/2014  . SOB (shortness of breath) on exertion 10/07/2014  . Skin lesion 10/07/2014  . Estrogen deficiency 01/23/2014  . 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 Vaginitis 10/21/2013  . Leg cramps 03/19/2013  . URI (upper respiratory infection) 03/05/2013  . Pain, neck 02/05/2013  . Low back pain 01/15/2013  . Right knee pain 01/15/2013  . Hypertension 03/19/2012  . Hypercholesterolemia 03/19/2012  . Anemia 03/19/2012  . Hypothyroidism 03/19/2012  . GERD (gastroesophageal reflux disease) 03/19/2012    Orientation RESPIRATION BLADDER Height & Weight     Self, Time, Situation, Place  Normal Continent Weight: 212 lb 0 oz (96.2 kg) Height:  5' 3.5" (161.3 cm)  BEHAVIORAL SYMPTOMS/MOOD NEUROLOGICAL BOWEL NUTRITION STATUS      Continent Diet(Regular Diet. )  AMBULATORY STATUS COMMUNICATION OF NEEDS Skin   Extensive Assist Verbally Surgical wounds(Incision: Right Knee. )                        Personal Care Assistance Level of Assistance  Bathing, Feeding, Dressing Bathing Assistance: Limited assistance Feeding assistance: Independent Dressing Assistance: Limited assistance     Functional Limitations Info  Sight, Hearing, Speech Sight Info: Adequate Hearing Info: Adequate Speech Info: Adequate    SPECIAL CARE FACTORS FREQUENCY  PT (By licensed PT), OT (By licensed OT)     PT Frequency: (5) OT Frequency: (5)            Contractures      Additional Factors Info  Code Status, Allergies Code Status Info: (Full Code. ) Allergies Info: (Codeine. )           Current Medications (04/18/2017):  This is the current hospital active medication list Current Facility-Administered Medications  Medication Dose Route Frequency Provider Last Rate Last Dose  . 0.9 %  sodium chloride infusion   Intravenous Continuous Dereck Leep, MD 100 mL/hr at 04/17/17 2011    . acetaminophen (OFIRMEV) IV 1,000 mg  1,000 mg Intravenous Q6H Hooten, Laurice Record, MD 400 mL/hr at 04/18/17 0507 1,000 mg at 04/18/17 0507  . acetaminophen (TYLENOL) tablet 650 mg  650 mg Oral Q4H PRN Hooten, Laurice Record, MD       Or  . acetaminophen (TYLENOL) suppository 650 mg  650 mg Rectal Q4H PRN Hooten, Laurice Record, MD      . alum & mag hydroxide-simeth (MAALOX/MYLANTA) 200-200-20 MG/5ML suspension 30 mL  30 mL Oral Q4H PRN Hooten, Laurice Record, MD      .  bisacodyl (DULCOLAX) suppository 10 mg  10 mg Rectal Daily PRN Hooten, Laurice Record, MD      . ceFAZolin (ANCEF) 2 g in dextrose 5 % 100 mL IVPB  2 g Intravenous Q6H Hooten, Laurice Record, MD 200 mL/hr at 04/18/17 0805 2 g at 04/18/17 0805  . celecoxib (CELEBREX) capsule 200 mg  200 mg Oral Q12H Hooten, Laurice Record, MD   200 mg at 04/17/17 2215  . cholecalciferol (VITAMIN D) tablet 1,000 Units  1,000 Units Oral Daily Hooten, Laurice Record, MD      . diphenhydrAMINE (BENADRYL) 12.5 MG/5ML elixir 12.5-25 mg  12.5-25 mg Oral Q4H PRN Hooten, Laurice Record, MD      . enoxaparin (LOVENOX) injection 30  mg  30 mg Subcutaneous Q12H Hooten, Laurice Record, MD   30 mg at 04/18/17 0804  . ferrous sulfate tablet 325 mg  325 mg Oral BID WC Hooten, Laurice Record, MD   325 mg at 04/18/17 0805  . lisinopril (PRINIVIL,ZESTRIL) tablet 20 mg  20 mg Oral Daily Hooten, Laurice Record, MD   20 mg at 04/17/17 2012   And  . hydrochlorothiazide (HYDRODIURIL) tablet 25 mg  25 mg Oral Daily Dereck Leep, MD   25 mg at 04/17/17 2012  . levothyroxine (SYNTHROID, LEVOTHROID) tablet 25 mcg  25 mcg Oral QAC breakfast Dereck Leep, MD   25 mcg at 04/18/17 0805  . loratadine (CLARITIN) tablet 10 mg  10 mg Oral Daily Hooten, Laurice Record, MD   10 mg at 04/17/17 2012  . magnesium hydroxide (MILK OF MAGNESIA) suspension 30 mL  30 mL Oral Daily PRN Hooten, Laurice Record, MD      . magnesium oxide (MAG-OX) tablet 400 mg  400 mg Oral BID Dereck Leep, MD   400 mg at 04/17/17 2215  . menthol-cetylpyridinium (CEPACOL) lozenge 3 mg  1 lozenge Oral PRN Hooten, Laurice Record, MD       Or  . phenol (CHLORASEPTIC) mouth spray 1 spray  1 spray Mouth/Throat PRN Hooten, Laurice Record, MD      . metoCLOPramide (REGLAN) tablet 10 mg  10 mg Oral TID AC & HS Hooten, Laurice Record, MD   10 mg at 04/17/17 2215  . morphine 2 MG/ML injection 2 mg  2 mg Intravenous Q2H PRN Hooten, Laurice Record, MD      . multivitamin with minerals tablet 1 tablet  1 tablet Oral Daily Hooten, Laurice Record, MD      . ondansetron (ZOFRAN) tablet 4 mg  4 mg Oral Q6H PRN Hooten, Laurice Record, MD       Or  . ondansetron (ZOFRAN) injection 4 mg  4 mg Intravenous Q6H PRN Hooten, Laurice Record, MD      . oxyCODONE (Oxy IR/ROXICODONE) immediate release tablet 10 mg  10 mg Oral Q3H PRN Hooten, Laurice Record, MD      . oxyCODONE (Oxy IR/ROXICODONE) immediate release tablet 5 mg  5 mg Oral Q3H PRN Hooten, Laurice Record, MD      . pantoprazole (PROTONIX) EC tablet 40 mg  40 mg Oral BID Dereck Leep, MD   40 mg at 04/17/17 2215  . pravastatin (PRAVACHOL) tablet 40 mg  40 mg Oral q1800 Hooten, Laurice Record, MD      . scopolamine (TRANSDERM-SCOP) 1  MG/3DAYS 1.5 mg  1 patch Transdermal Once Gunnar Bulla, MD   1.5 mg at 04/17/17 1304  . senna-docusate (Senokot-S) tablet 1 tablet  1 tablet Oral BID Hooten, Laurice Record,  MD   1 tablet at 04/17/17 2215  . sodium phosphate (FLEET) 7-19 GM/118ML enema 1 enema  1 enema Rectal Once PRN Hooten, Laurice Record, MD      . traMADol Veatrice Bourbon) tablet 50-100 mg  50-100 mg Oral Q4H PRN Dereck Leep, MD   100 mg at 04/18/17 0522  . vitamin B-12 (CYANOCOBALAMIN) tablet 1,000 mcg  1,000 mcg Oral Daily Hooten, Laurice Record, MD         Discharge Medications: Please see discharge summary for a list of discharge medications.  Relevant Imaging Results:  Relevant Lab Results:   Additional Information (SSN: [id number])  Andreah Goheen, Veronia Beets, LCSW

## 2017-04-19 ENCOUNTER — Encounter
Admission: RE | Admit: 2017-04-19 | Discharge: 2017-04-19 | Disposition: A | Discharge status: Home / Self Care | Payer: Medicare HMO | Source: Ambulatory Visit | Attending: Internal Medicine | Admitting: Internal Medicine

## 2017-04-19 NOTE — Progress Notes (Signed)
Physical Therapy Treatment Patient Details Name: Amanda Browning MRN: [id number] DOB: 02/10/1946 Today's Date: 04/19/2017    History of Present Illness 72 y/o female s/p R TKA  04/17/17.    PT Comments    Pt agreeable to PT; reports pain continues at 6/10 R knee. Pt states she will call for Tylenol post session. Pt educated in stand exercises for Right lower extremity and re instructed in quad sets as well as stretching in right knee flexion. Pt demonstrating improved STS transfers and quality of gait sequence. Continue PT to progress range, strength, endurance to improve all functional mobility.   Follow Up Recommendations  SNF     Equipment Recommendations       Recommendations for Other Services       Precautions / Restrictions Precautions Precautions: Fall;Knee Restrictions Weight Bearing Restrictions: Yes RLE Weight Bearing: Weight bearing as tolerated    Mobility  Bed Mobility               General bed mobility comments: Not tested; up in chair  Transfers Overall transfer level: Needs assistance Equipment used: Rolling walker (2 wheeled) Transfers: Sit to/from Stand Sit to Stand: Min guard         General transfer comment: Improved safe hand placement; continue cues for improved use of RLE especially with sit  Ambulation/Gait Ambulation/Gait assistance: Min guard Ambulation Distance (Feet): 45 Feet Assistive device: Rolling walker (2 wheeled) Gait Pattern/deviations: Step-to pattern;Step-through pattern(partial step through)   Gait velocity interpretation: <1.8 ft/sec, indicative of risk for recurrent falls(1.0 ft/sec) General Gait Details: Initial step to with improvement to partial step through with instruction and cues. Improved 3 point sequence   Stairs            Wheelchair Mobility    Modified Rankin (Stroke Patients Only)       Balance Overall balance assessment: Needs assistance Sitting-balance support: Feet  supported Sitting balance-Leahy Scale: Good     Standing balance support: Bilateral upper extremity supported Standing balance-Leahy Scale: Fair                              Cognition Arousal/Alertness: Awake/alert Behavior During Therapy: WFL for tasks assessed/performed Overall Cognitive Status: Within Functional Limits for tasks assessed                                        Exercises Total Joint Exercises Quad Sets: Strengthening;Right;10 reps;Standing(5 in stand pre gait) Straight Leg Raises: Strengthening;Right;10 reps;Standing(2 sets) Long Arc Quad: Strengthening;Right;15 reps;Seated Knee Flexion: AROM;Right;10 reps;Seated(3 positions each rep with 10 sec hold) Goniometric ROM: 0-83 Marching in Standing: AROM;Right;10 reps;Standing(2 sets)    General Comments        Pertinent Vitals/Pain Pain Assessment: 0-10 Pain Score: 6  Pain Location: R knee Pain Intervention(s): Ice applied;Monitored during session;Other (comment)(Pt will call for Tylenol post session)    Home Living                      Prior Function            PT Goals (current goals can now be found in the care plan section) Progress towards PT goals: Progressing toward goals    Frequency    BID      PT Plan Current plan remains appropriate    Co-evaluation  AM-PAC PT "6 Clicks" Daily Activity  Outcome Measure  Difficulty turning over in bed (including adjusting bedclothes, sheets and blankets)?: A Little Difficulty moving from lying on back to sitting on the side of the bed? : Unable Difficulty sitting down on and standing up from a chair with arms (e.g., wheelchair, bedside commode, etc,.)?: Unable Help needed moving to and from a bed to chair (including a wheelchair)?: A Little Help needed walking in hospital room?: A Little Help needed climbing 3-5 steps with a railing? : A Lot 6 Click Score: 13    End of Session Equipment  Utilized During Treatment: Gait belt Activity Tolerance: Patient tolerated treatment well;Patient limited by fatigue;Patient limited by pain Patient left: in chair;with call bell/phone within reach;with chair alarm set;Other (comment);with family/visitor present(polar care in place)   PT Visit Diagnosis: Muscle weakness (generalized) (M62.81);Difficulty in walking, not elsewhere classified (R26.2)     Time: 3748-2707 PT Time Calculation (min) (ACUTE ONLY): 25 min  Charges:  $Gait Training: 8-22 mins $Therapeutic Exercise: 8-22 mins                    G Codes:  Functional Assessment Tool Used: AM-PAC 6 Clicks Basic Mobility     Larae Grooms, PTA 04/19/2017, 2:36 PM

## 2017-04-19 NOTE — Progress Notes (Signed)
   Subjective: 2 Days Post-Op Procedure(s) (LRB): COMPUTER ASSISTED TOTAL KNEE ARTHROPLASTY (Right) Patient reports pain as 7 on 0-10 scale. But does not appear to be in this much discomfort. States pain is much better than what it was. Slept on the night very well.  Patient is well, and has had no acute complaints or problems Continue physical therapy. Did fair with therapy yesterday. Only had one session. Plan is to go Rehab after hospital stay. Patient currently has been off her at [facility]. Waiting and shortness authorization no nausea and no vomiting Patient denies any chest pains or shortness of breath. Objective: Vital signs in last 24 hours: Temp:  [97.7 F (36.5 C)-98.3 F (36.8 C)] 98.1 F (36.7 C) (01/02 0358) Pulse Rate:  [62-72] 62 (01/02 0358) Resp:  [16-20] 17 (01/02 0358) BP: (131-161)/(58-80) 131/62 (01/02 0358) SpO2:  [94 %-99 %] 94 % (01/02 0358) well approximated incision Heels are non tender and elevated off the bed using rolled towels Intake/Output from previous day: 01/01 0701 - 01/02 0700 In: 2241.7 [P.O.:960; I.V.:1281.7] Out: 160 [Drains:160] Intake/Output this shift: Total I/O In: 240 [P.O.:240] Out: 160 [Drains:160]  No results for input(s): HGB in the last 72 hours. No results for input(s): WBC, RBC, HCT, PLT in the last 72 hours. No results for input(s): NA, K, CL, CO2, BUN, CREATININE, GLUCOSE, CALCIUM in the last 72 hours. No results for input(s): LABPT, INR in the last 72 hours.  EXAM General - Patient is Alert, Appropriate and Oriented Extremity - Neurologically intact Neurovascular intact Sensation intact distally Intact pulses distally Dorsiflexion/Plantar flexion intact No cellulitis present Compartment soft Dressing - dressing C/D/I Motor Function - intact, moving foot and toes well on exam.    Past Medical History:  Diagnosis Date  . Colon polyps   . Complication of anesthesia    doesn't take much anesthesia to put pt to  sleep, slow to wake up  . DDD (degenerative disc disease), cervical 02/05/13   C5-C6 and C6-C7  . GERD (gastroesophageal reflux disease)   . History of chicken pox   . Hypercholesterolemia   . Hypertension   . Hypothyroidism   . Migraines   . PONV (postoperative nausea and vomiting)     Assessment/Plan: 2 Days Post-Op Procedure(s) (LRB): COMPUTER ASSISTED TOTAL KNEE ARTHROPLASTY (Right) Active Problems:   S/P total knee arthroplasty  Estimated body mass index is 36.97 kg/m as calculated from the following:   Height as of this encounter: 5' 3.5" (1.613 m).   Weight as of this encounter: 96.2 kg (212 lb 0 oz). Up with therapy Plan for discharge tomorrow Discharge to SNF  Labs: None DVT Prophylaxis - Lovenox, Foot Pumps and TED hose Weight-Bearing as tolerated to right leg Hemovac was discontinued on today's visit. The ends of the Hemovac appeared to be intact. Patient needs a bowel movement Please wash the operative leg and apply TED stockings to both legs  Jon R. [place] 04/19/2017, 6:52 AM

## 2017-04-19 NOTE — Anesthesia Postprocedure Evaluation (Signed)
Anesthesia Post Note  Patient: Amanda Browning  Procedure(s) Performed: COMPUTER ASSISTED TOTAL KNEE ARTHROPLASTY (Right Knee)  Patient location during evaluation: Nursing Unit Anesthesia Type: Spinal Level of consciousness: awake and alert, awake and oriented Pain management: pain level controlled Vital Signs Assessment: post-procedure vital signs reviewed and stable Respiratory status: spontaneous breathing, nonlabored ventilation and respiratory function stable Cardiovascular status: blood pressure returned to baseline and stable Postop Assessment: no headache and no backache Anesthetic complications: no     Last Vitals:  Vitals:   04/19/17 0037 04/19/17 0358  BP: (!) 138/58 131/62  Pulse: 69 62  Resp: 16 17  Temp: 36.5 C 36.7 C  SpO2: 94% 94%    Last Pain:  Vitals:   04/19/17 0630  TempSrc:   PainSc: 6                  Proofreader

## 2017-04-19 NOTE — Progress Notes (Signed)
Physical Therapy Treatment Patient Details Name: Amanda Browning MRN: [id number] DOB: 1946-04-12 Today's Date: 04/19/2017    History of Present Illness 72 y/o female s/p R TKA  04/17/17.    PT Comments    Pt agreeable to PT; reports 6/10 pain in R knee. Pt participates and educated on stretching and strengthening of R knee; range 0-83 degrees. Educated on improved use of Right lower extremity with STS transfers and quad sets prior to gait. Requires instruction as well for improve reciprocal gait pattern and sequencing; min guard throughout stand/gait activities. Pain mildly increased with gait and fatigues. Continue PT to progress range, strength, endurance to improve all functional mobility.    Follow Up Recommendations  SNF     Equipment Recommendations       Recommendations for Other Services       Precautions / Restrictions Precautions Precautions: Fall;Knee Restrictions Weight Bearing Restrictions: Yes RLE Weight Bearing: Weight bearing as tolerated    Mobility  Bed Mobility               General bed mobility comments: Not tested; up in chair  Transfers Overall transfer level: Needs assistance Equipment used: Rolling walker (2 wheeled) Transfers: Sit to/from Stand Sit to Stand: Min guard         General transfer comment: Cues for safe hand placement and increased use of RLE  Ambulation/Gait Ambulation/Gait assistance: Min guard Ambulation Distance (Feet): 40 Feet Assistive device: Rolling walker (2 wheeled) Gait Pattern/deviations: Step-to pattern;Step-through pattern(partial step through)   Gait velocity interpretation: <1.8 ft/sec, indicative of risk for recurrent falls(1.0 ft/sec) General Gait Details: Initial step to with improvement to partial step through with instruction and cues. Additional step with versus true 3 point sequence despite cues. Pain mildly increases; pt fatigues as well   Science writer     Modified Rankin (Stroke Patients Only)       Balance Overall balance assessment: Needs assistance Sitting-balance support: Feet supported Sitting balance-Leahy Scale: Good     Standing balance support: Bilateral upper extremity supported Standing balance-Leahy Scale: Fair                              Cognition Arousal/Alertness: Awake/alert Behavior During Therapy: WFL for tasks assessed/performed Overall Cognitive Status: Within Functional Limits for tasks assessed                                        Exercises Total Joint Exercises Quad Sets: Strengthening;Both;20 reps(5 in stand pre gait) Long Arc Quad: Strengthening;Right;15 reps;Seated Knee Flexion: AROM;Right;10 reps;Seated(3 positions each rep with 10 sec hold) Goniometric ROM: 0-83    General Comments        Pertinent Vitals/Pain Pain Assessment: 0-10 Pain Score: 6  Pain Location: R knee Pain Intervention(s): Monitored during session;Premedicated before session;Ice applied    Home Living                      Prior Function            PT Goals (current goals can now be found in the care plan section) Progress towards PT goals: Progressing toward goals    Frequency    BID      PT Plan Current plan remains appropriate    Co-evaluation  AM-PAC PT "6 Clicks" Daily Activity  Outcome Measure  Difficulty turning over in bed (including adjusting bedclothes, sheets and blankets)?: A Little Difficulty moving from lying on back to sitting on the side of the bed? : Unable Difficulty sitting down on and standing up from a chair with arms (e.g., wheelchair, bedside commode, etc,.)?: Unable Help needed moving to and from a bed to chair (including a wheelchair)?: A Little Help needed walking in hospital room?: A Little Help needed climbing 3-5 steps with a railing? : A Lot 6 Click Score: 13    End of Session Equipment Utilized During Treatment:  Gait belt Activity Tolerance: Patient tolerated treatment well;Patient limited by fatigue;Patient limited by pain Patient left: in chair;with call bell/phone within reach;with chair alarm set;Other (comment)(polar care in place)   PT Visit Diagnosis: Muscle weakness (generalized) (M62.81);Difficulty in walking, not elsewhere classified (R26.2)     Time: 3545-6256 PT Time Calculation (min) (ACUTE ONLY): 28 min  Charges:  $Gait Training: 8-22 mins $Therapeutic Exercise: 8-22 mins                    G Codes:  Functional Assessment Tool Used: AM-PAC 6 Clicks Basic Mobility     Larae Grooms, PTA 04/19/2017, 11:54 AM

## 2017-04-20 ENCOUNTER — Other Ambulatory Visit: Payer: Self-pay | Admitting: Internal Medicine

## 2017-04-20 DIAGNOSIS — M50323 Other cervical disc degeneration at C6-C7 level: Secondary | ICD-10-CM | POA: Diagnosis not present

## 2017-04-20 DIAGNOSIS — E7800 Pure hypercholesterolemia, unspecified: Secondary | ICD-10-CM | POA: Diagnosis not present

## 2017-04-20 DIAGNOSIS — Z471 Aftercare following joint replacement surgery: Secondary | ICD-10-CM | POA: Diagnosis not present

## 2017-04-20 DIAGNOSIS — R2689 Other abnormalities of gait and mobility: Secondary | ICD-10-CM | POA: Diagnosis not present

## 2017-04-20 DIAGNOSIS — E039 Hypothyroidism, unspecified: Secondary | ICD-10-CM | POA: Diagnosis not present

## 2017-04-20 DIAGNOSIS — K5900 Constipation, unspecified: Secondary | ICD-10-CM | POA: Diagnosis not present

## 2017-04-20 DIAGNOSIS — I10 Essential (primary) hypertension: Secondary | ICD-10-CM | POA: Diagnosis not present

## 2017-04-20 DIAGNOSIS — K219 Gastro-esophageal reflux disease without esophagitis: Secondary | ICD-10-CM | POA: Diagnosis not present

## 2017-04-20 DIAGNOSIS — M6281 Muscle weakness (generalized): Secondary | ICD-10-CM | POA: Diagnosis not present

## 2017-04-20 DIAGNOSIS — M1711 Unilateral primary osteoarthritis, right knee: Secondary | ICD-10-CM | POA: Diagnosis not present

## 2017-04-20 DIAGNOSIS — Z96651 Presence of right artificial knee joint: Secondary | ICD-10-CM | POA: Diagnosis not present

## 2017-04-20 NOTE — Progress Notes (Signed)
Patient is medically stable for D/C to [facility] today. Per [facility] admissions coordinator at [facility] authorization has been received. Per Sharyn Lull patient can come today to room 208-B. RN will call report at [phone] and patient's daughter will provide transport. Clinical Education officer, museum (CSW) sent D/C orders to [employer] via [employer]. Patient is aware of above. CSW contacted patient's former husband Collins [place] and made him aware of above. Please reconsult if future social work needs arise. CSW signing off.   [employer], LCSW [phone]

## 2017-04-20 NOTE — Progress Notes (Signed)
   Subjective: 3 Days Post-Op Procedure(s) (LRB): COMPUTER ASSISTED TOTAL KNEE ARTHROPLASTY (Right) Patient reports pain as 4 on 0-10 scale.   Patient is well, and has had no acute complaints or problems Did fair with physical therapy yesterday. Good range of motion but only ambulated 45 feet. Plan is to go Rehab after hospital stay. no nausea and no vomiting Patient denies any chest pains or shortness of breath. Objective: Vital signs in last 24 hours: Temp:  [97.6 F (36.4 C)-98.7 F (37.1 C)] 98.7 F (37.1 C) (01/03 0310) Pulse Rate:  [62-79] 68 (01/03 0310) Resp:  [16-20] 19 (01/03 0310) BP: (122-154)/(56-74) 144/72 (01/03 0310) SpO2:  [95 %-96 %] 96 % (01/03 0310) well approximated incision Heels are non tender and elevated off the bed using rolled towels Intake/Output from previous day: 01/02 0701 - 01/03 0700 In: 240 [P.O.:240] Out: -  Intake/Output this shift: No intake/output data recorded.  No results for input(s): HGB in the last 72 hours. No results for input(s): WBC, RBC, HCT, PLT in the last 72 hours. No results for input(s): NA, K, CL, CO2, BUN, CREATININE, GLUCOSE, CALCIUM in the last 72 hours. No results for input(s): LABPT, INR in the last 72 hours.  EXAM General - Patient is Alert, Appropriate and Oriented Extremity - Neurologically intact Neurovascular intact Sensation intact distally Intact pulses distally Dorsiflexion/Plantar flexion intact No cellulitis present Compartment soft Dressing - scant drainage Motor Function - intact, moving foot and toes well on exam.    Past Medical History:  Diagnosis Date  . Colon polyps   . Complication of anesthesia    doesn't take much anesthesia to put pt to sleep, slow to wake up  . DDD (degenerative disc disease), cervical 02/05/13   C5-C6 and C6-C7  . GERD (gastroesophageal reflux disease)   . History of chicken pox   . Hypercholesterolemia   . Hypertension   . Hypothyroidism   . Migraines   . PONV  (postoperative nausea and vomiting)     Assessment/Plan: 3 Days Post-Op Procedure(s) (LRB): COMPUTER ASSISTED TOTAL KNEE ARTHROPLASTY (Right) Active Problems:   S/P total knee arthroplasty  Estimated body mass index is 36.97 kg/m as calculated from the following:   Height as of this encounter: 5' 3.5" (1.613 m).   Weight as of this encounter: 96.2 kg (212 lb 0 oz). Up with therapy Discharge to SNF  Labs: None DVT Prophylaxis - Lovenox, Foot Pumps and TED hose Weight-Bearing as tolerated to right leg Patient may be discharged to rehabilitation once approval was made Please change dressing to operative leg prior to being discharged  [place]. [place] 04/20/2017, 7:31 AM

## 2017-04-20 NOTE — Progress Notes (Signed)
Report called to facility. VSS. Pt received discharge education and prescriptions. Verbalized understanding. IV removed. Honeycomb dressing to knee changed. Pt wheeled to visitors entrance and assisted into family members vehicle by NT for transport to [facility] facility.

## 2017-04-20 NOTE — Progress Notes (Signed)
Physical Therapy Treatment Patient Details Name: Amanda Browning MRN: [id number] DOB: March 30, 1946 Today's Date: 04/20/2017    History of Present Illness 72 y/o female s/p R TKA  04/17/17.    PT Comments    Pt is able to ambulate into the hallway with good confidence and consistent speed.  She c/o mild pain initially that increased to 5/10 with exercises and ambulation.  She showed good confidence with mobility and felt confident about getting in/out of car to go to rehab after demo and instruction from PT.     Follow Up Recommendations  SNF     Equipment Recommendations       Recommendations for Other Services       Precautions / Restrictions Precautions Precautions: Fall;Knee Restrictions RLE Weight Bearing: Weight bearing as tolerated    Mobility  Bed Mobility               General bed mobility comments: Not tested; up in chair  Transfers Overall transfer level: Modified independent Equipment used: Rolling walker (2 wheeled) Transfers: Sit to/from Stand Sit to Stand: Supervision         General transfer comment: Pt did well rising to standing w/o direct assist, minimal cuing  Ambulation/Gait Ambulation/Gait assistance: Supervision Ambulation Distance (Feet): 60 Feet Assistive device: Rolling walker (2 wheeled)       General Gait Details: Pt with less hesitation today and overall did well.  She was able to maintain walker momentum with limited limp/guarding.   Stairs            Wheelchair Mobility    Modified Rankin (Stroke Patients Only)       Balance Overall balance assessment: Needs assistance Sitting-balance support: Feet supported Sitting balance-Leahy Scale: Good                                      Cognition Arousal/Alertness: Awake/alert Behavior During Therapy: WFL for tasks assessed/performed Overall Cognitive Status: Within Functional Limits for tasks assessed                                         Exercises Total Joint Exercises Ankle Circles/Pumps: Strengthening;10 reps Quad Sets: Strengthening;Right;10 reps;Standing Gluteal Sets: Strengthening;10 reps Heel Slides: Strengthening;10 reps Hip ABduction/ADduction: Strengthening;10 reps Straight Leg Raises: Strengthening;10 reps Long Arc Quad: Strengthening;10 reps Knee Flexion: PROM;5 reps Goniometric ROM: 0-92    General Comments        Pertinent Vitals/Pain Pain Assessment: 0-10 Pain Score: 5     Home Living                      Prior Function            PT Goals (current goals can now be found in the care plan section) Progress towards PT goals: Progressing toward goals    Frequency    BID      PT Plan Current plan remains appropriate    Co-evaluation              AM-PAC PT "6 Clicks" Daily Activity  Outcome Measure  Difficulty turning over in bed (including adjusting bedclothes, sheets and blankets)?: None Difficulty moving from lying on back to sitting on the side of the bed? : A Little Difficulty sitting down on and standing up from a  chair with arms (e.g., wheelchair, bedside commode, etc,.)?: A Little Help needed moving to and from a bed to chair (including a wheelchair)?: None Help needed walking in hospital room?: None Help needed climbing 3-5 steps with a railing? : A Little 6 Click Score: 21    End of Session Equipment Utilized During Treatment: Gait belt Activity Tolerance: Patient tolerated treatment well;Patient limited by fatigue;Patient limited by pain Patient left: with chair alarm set;with call bell/phone within reach Nurse Communication: Mobility status PT Visit Diagnosis: Muscle weakness (generalized) (M62.81);Difficulty in walking, not elsewhere classified (R26.2)     Time: 5329-9242 PT Time Calculation (min) (ACUTE ONLY): 25 min  Charges:  $Gait Training: 8-22 mins $Therapeutic Exercise: 8-22 mins                    G Codes:       Kreg Shropshire, DPT 04/20/2017, 9:37 AM

## 2017-04-20 NOTE — Clinical Social Work Placement (Signed)
   CLINICAL SOCIAL WORK PLACEMENT  NOTE  Date:  04/20/2017  Patient Details  Name: Amanda Browning MRN: [id number] Date of Birth: 11-14-1945  Clinical Social Work is seeking post-discharge placement for this patient at the [place] level of care (*CSW will initial, date and re-position this form in  chart as items are completed):  Yes   Patient/family provided with [place] Work Department's list of facilities offering this level of care within the geographic area requested by the patient (or if unable, by the patient's family).  Yes   Patient/family informed of their freedom to choose among providers that offer the needed level of care, that participate in Medicare, Medicaid or managed care program needed by the patient, have an available bed and are willing to accept the patient.  Yes   Patient/family informed of [facility]'s ownership interest in [facility] and [facility], as well as of the fact that they are under no obligation to receive care at these facilities.  PASRR submitted to EDS on 04/18/17     PASRR number received on 04/18/17     Existing PASRR number confirmed on       FL2 transmitted to all facilities in geographic area requested by pt/family on 04/18/17     FL2 transmitted to all facilities within larger geographic area on       Patient informed that his/her managed care company has contracts with or will negotiate with certain facilities, including the following:        Yes   Patient/family informed of bed offers received.  Patient chooses bed at [facility] )     Physician recommends and patient chooses bed at      Patient to be transferred to [facility] ) on 04/20/17.  Patient to be transferred to facility by (Patient's daughter will provide transport. )     Patient family notified on 04/20/17 of transfer.  Name of family member notified:  ([place] contacted patient's former husband Collins Scotland and made him  aware of D/C today. )     PHYSICIAN       Additional Comment:    _______________________________________________ Heavyn Yearsley, Veronia Beets, LCSW 04/20/2017, 2:14 PM

## 2017-04-21 ENCOUNTER — Other Ambulatory Visit: Payer: Self-pay

## 2017-04-21 DIAGNOSIS — I10 Essential (primary) hypertension: Secondary | ICD-10-CM | POA: Diagnosis not present

## 2017-04-21 DIAGNOSIS — K219 Gastro-esophageal reflux disease without esophagitis: Secondary | ICD-10-CM | POA: Diagnosis not present

## 2017-04-21 DIAGNOSIS — M1711 Unilateral primary osteoarthritis, right knee: Secondary | ICD-10-CM | POA: Diagnosis not present

## 2017-04-21 MED ORDER — TRAMADOL HCL 50 MG PO TABS: 50.0000 mg | ORAL_TABLET | ORAL | 1 refills | Status: DC | PRN

## 2017-04-21 NOTE — Telephone Encounter (Signed)
Rx sent to [facility] phone : [phone] , fax : [phone]  

## 2017-04-27 ENCOUNTER — Encounter: Payer: Self-pay | Admitting: Gerontology

## 2017-04-27 ENCOUNTER — Non-Acute Institutional Stay (SKILLED_NURSING_FACILITY): Payer: Medicare HMO | Admitting: Gerontology

## 2017-04-27 DIAGNOSIS — M1711 Unilateral primary osteoarthritis, right knee: Secondary | ICD-10-CM

## 2017-04-27 DIAGNOSIS — K5900 Constipation, unspecified: Secondary | ICD-10-CM

## 2017-04-27 DIAGNOSIS — Z96651 Presence of right artificial knee joint: Secondary | ICD-10-CM

## 2017-04-27 NOTE — Progress Notes (Addendum)
Location:   [place] Room Number: 208B Place of Service:  SNF [phone])  Provider: Toni Arthurs, NP-C  PCP: Einar Pheasant, MD Patient Care Team: Einar Pheasant, MD as PCP - General (Internal Medicine)  Extended Emergency Contact Information Primary Emergency Contact: Cristy Hilts Mobile Phone: [phone] Relation: Daughter Secondary Emergency Contact: Claude Manges Address: [address] [place], [place] [address] Home Phone: [phone] Relation: Daughter  Code Status: FULL Goals of care:  Advanced Directive information Advanced Directives 04/27/2017  Does Patient Have a Medical Advance Directive? No  Would patient like information on creating a medical advance directive? No - Patient declined     Allergies  Allergen Reactions  . Codeine Other (See Comments)    Can not function    Chief Complaint  Patient presents with  . Discharge Note    Discharged from SNF    HPI:  72 y.o. female seen today for discharge evaluation. Pt was admitted to the facility for rehab following hospitalization at [facility] for Right Total Knee replacement. Pt has been participating in PT and OT. She reports her pain is controlled on current medication regimen. She c/o her knww is throbbing now, but has just undergone PT. Polar care in place. Incision well approximated. No redness, no warmth, no edema. Pt reports her appetite is good. She is voiding without difficulty. She does c/o some constipation and would like laxatives adjusted. Otherwise, pt is ready to discharge. VSS. No other complaints.      Past Medical History:  Diagnosis Date  . Anemia   . Chicken pox   . Colon polyps   . Complication of anesthesia    doesn't take much anesthesia to put pt to sleep, slow to wake up  . DDD (degenerative disc disease), cervical 02/05/13   C5-C6 and C6-C7  . GERD (gastroesophageal reflux disease)   . History of chicken pox   . Hypercholesterolemia   .  Hyperlipidemia   . Hypertension   . Hypothyroidism   . Migraines   . PONV (postoperative nausea and vomiting)   . Squamous cell skin cancer     Past Surgical History:  Procedure Laterality Date  . BREAST BIOPSY Right 2012   stereotactic  . KNEE ARTHROPLASTY Right 04/17/2017   Procedure: COMPUTER ASSISTED TOTAL KNEE ARTHROPLASTY;  Surgeon: Dereck Leep, MD;  Location: [facility] ORS;  Service: Orthopedics;  Laterality: Right;  . SKIN BIOPSY    . VAGINAL HYSTERECTOMY  1994   ovaries not removed  . WISDOM TOOTH EXTRACTION        reports that  has never smoked. she has never used smokeless tobacco. She reports that she does not drink alcohol or use drugs. Social History   Socioeconomic History  . Marital status: Divorced    Spouse name: Not on file  . Number of children: 2  . Years of education: 63  . Highest education level: High school graduate  Social Needs  . Financial resource strain: Not on file  . Food insecurity - worry: Not on file  . Food insecurity - inability: Not on file  . Transportation needs - medical: Not on file  . Transportation needs - non-medical: Not on file  Occupational History  . Not on file  Tobacco Use  . Smoking status: Never Smoker  . Smokeless tobacco: Never Used  Substance and Sexual Activity  . Alcohol use: No    Alcohol/week: 0.0 oz  .  Drug use: No  . Sexual activity: No  Other Topics Concern  . Not on file  Social History Narrative   Full Code   Alcohol - none   Never smoker and no smokeless tobacco   2 daughters   Functional Status Survey:    Allergies  Allergen Reactions  . Codeine Other (See Comments)    Can not function    Pertinent  Health Maintenance Due  Topic Date Due  . MAMMOGRAM  03/22/2018  . COLONOSCOPY  11/20/2022  . INFLUENZA VACCINE  Completed  . DEXA SCAN  Completed  . PNA vac Low Risk Adult  Completed    Medications: Allergies as of 04/27/2017      Reactions   Codeine Other (See Comments)   Can not  function      Medication List        Accurate as of 04/27/17 11:03 AM. Always use your most recent med list.          acetaminophen 325 MG tablet Commonly known as:  TYLENOL Take 650 mg by mouth 4 (four) times daily as needed.   cetirizine 10 MG tablet Commonly known as:  ZYRTEC Take 10 mg by mouth daily.   enoxaparin 30 MG/0.3ML injection Commonly known as:  LOVENOX Inject 0.3 mLs (30 mg total) into the skin every 12 (twelve) hours.   glucosamine-chondroitin 500-400 MG tablet Take 1 tablet by mouth daily.   levothyroxine 25 MCG tablet Commonly known as:  SYNTHROID, LEVOTHROID Take 25 mcg by mouth daily before breakfast.   lisinopril-hydrochlorothiazide 20-25 MG tablet Commonly known as:  PRINZIDE,ZESTORETIC Take 1 tablet by mouth daily.   lovastatin 40 MG tablet Commonly known as:  MEVACOR Take 40 mg by mouth at bedtime.   magnesium oxide 400 MG tablet Commonly known as:  MAG-OX Take 1 tablet (400 mg total) by mouth 2 (two) times daily.   multivitamin tablet Take 1 tablet by mouth daily.   omeprazole 20 MG capsule Commonly known as:  PRILOSEC Take 20 mg by mouth daily.   traMADol 50 MG tablet Commonly known as:  ULTRAM Take 1-2 tablets (50-100 mg total) by mouth every 4 (four) hours as needed for moderate pain.   vitamin B-12 1000 MCG tablet Commonly known as:  CYANOCOBALAMIN Take 1 tablet by mouth daily.       Review of Systems  Constitutional: Negative for activity change, appetite change, chills, diaphoresis and fever.  HENT: Negative for congestion, mouth sores, nosebleeds, postnasal drip, sneezing, sore throat, trouble swallowing and voice change.   Respiratory: Negative for apnea, cough, choking, chest tightness, shortness of breath and wheezing.   Cardiovascular: Negative for chest pain, palpitations and leg swelling.  Gastrointestinal: Negative for abdominal distention, abdominal pain, constipation, diarrhea and nausea.  Genitourinary:  Negative for difficulty urinating, dysuria, frequency and urgency.  Musculoskeletal: Positive for arthralgias (typical arthritis) and gait problem. Negative for back pain and myalgias.  Skin: Positive for wound. Negative for color change, pallor and rash.  Neurological: Negative for dizziness, tremors, syncope, speech difficulty, weakness, numbness and headaches.  Psychiatric/Behavioral: Negative for agitation and behavioral problems.  All other systems reviewed and are negative.   Vitals:   04/27/17 1038  BP: (!) 145/72  Pulse: 98  Resp: 20  Temp: 97.9 F (36.6 C)  TempSrc: Oral  SpO2: 98%  Weight: 218 lb 11.2 oz (99.2 kg)  Height: 5' 3.5" (1.613 m)   Body mass index is 38.13 kg/m. Physical Exam  Constitutional: She is oriented to  person, place, and time. Vital signs are normal. She appears well-developed and well-nourished. She is active and cooperative. She does not appear ill. No distress.  HENT:  Head: Normocephalic and atraumatic.  Mouth/Throat: Uvula is midline, oropharynx is clear and moist and mucous membranes are normal. Mucous membranes are not pale, not dry and not cyanotic.  Eyes: Conjunctivae, EOM and lids are normal. Pupils are equal, round, and reactive to light.  Neck: Trachea normal, normal range of motion and full passive range of motion without pain. Neck supple. No JVD present. No tracheal deviation, no edema and no erythema present. No thyromegaly present.  Cardiovascular: Normal rate, regular rhythm, normal heart sounds, intact distal pulses and normal pulses. Exam reveals no gallop, no distant heart sounds and no friction rub.  No murmur heard. Pulses:      Dorsalis pedis pulses are 2+ on the right side, and 2+ on the left side.  No edema  Pulmonary/Chest: Effort normal and breath sounds normal. No accessory muscle usage. No respiratory distress. She has no decreased breath sounds. She has no wheezes. She has no rhonchi. She has no rales. She exhibits no  tenderness.  Abdominal: Soft. Normal appearance and bowel sounds are normal. She exhibits no distension and no ascites. There is no tenderness.  Musculoskeletal: She exhibits no edema or tenderness.       Right knee: She exhibits decreased range of motion, swelling and laceration.  Expected osteoarthritis, stiffness; Bilateral Calves soft, supple. Negative Homan's Sign. B- pedal pulses equal  Neurological: She is alert and oriented to person, place, and time. She has normal strength.  Skin: Skin is warm and dry. Laceration (R TKA) noted. She is not diaphoretic. No cyanosis. No pallor. Nails show no clubbing.  Psychiatric: She has a normal mood and affect. Her speech is normal and behavior is normal. Judgment and thought content normal. Cognition and memory are normal.  Nursing note and vitals reviewed.   Labs reviewed: Basic Metabolic Panel: Recent Labs    01/20/17 1616  02/07/17 1553 02/23/17 1559 04/05/17 1343  NA 130*   < > 133* 135 139  K 3.9  --  4.6  --  3.5  CL 93*  --  97  --  101  CO2 26  --  30  --  31  GLUCOSE 90  --  106*  --  108*  BUN 12  --  11  --  16  CREATININE 0.89  --  0.94  --  0.85  CALCIUM 9.6  --  9.4  --  9.7  MG 1.6  --   --   --   --    < > = values in this interval not displayed.   Liver Function Tests: Recent Labs    01/20/17 1616 04/05/17 1343  AST 19 24  ALT 20 23  ALKPHOS  --  62  BILITOT 0.9 0.7  PROT 6.5 6.7  ALBUMIN  --  4.0   No results for input(s): LIPASE, AMYLASE in the last 8760 hours. No results for input(s): AMMONIA in the last 8760 hours. CBC: Recent Labs    01/20/17 1616 04/05/17 1343  WBC 6.8 5.9  NEUTROABS 3,930  --   HGB 13.0 13.6  HCT 37.8 40.5  MCV 95.9 99.0  PLT 212 214   Cardiac Enzymes: No results for input(s): CKTOTAL, CKMB, CKMBINDEX, TROPONINI in the last 8760 hours. BNP: Invalid input(s): POCBNP CBG: No results for input(s): GLUCAP in the last 8760 hours.  Procedures and Imaging Studies During  Stay: Dg Knee Right Port  Result Date: 04/17/2017 CLINICAL DATA:  Right knee replacement. EXAM: PORTABLE RIGHT KNEE - 1-2 VIEW COMPARISON:  Two-view right knee radiographs 10/02/2015. FINDINGS: Right total knee arthroplasty is noted. Knee is located. A surgical drain is in place. Joint space is symmetric. IMPRESSION: Right knee arthroplasty without radiographic evidence for complication. Electronically Signed   By: San Morelle M.D.   On: 04/17/2017 17:44    Assessment/Plan:   1. Primary osteoarthritis of right knee 2. Status post total right knee replacement  Continue PT/OT  Continue exercises as taught by PT/OT  Continuous polar care  Skin care per protocol  Continue Tramadol 50-100 mg po Q 4 hours prn  Continue Tylenol 650 mg po Q 4 hours prn  Continue Lovenox 30 mg SQ BID  Follow up with Orthopedics as instructed for continuity of care  3. Constipation, unspecified constipated type  Senna-S 1 tablet po BID   Patient is being discharged with the following home health services: OPPT   Patient is being discharged with the following durable medical equipment: none    Patient has been advised to f/u with their PCP in 1-2 weeks to bring them up to date on their rehab stay.  Social services at facility was responsible for arranging this appointment.  Pt was provided with a 30 day supply of prescriptions for medications and refills must be obtained from their PCP.  For controlled substances, a more limited supply may be provided adequate until PCP appointment only.  Future labs/tests needed:    Family/ staff Communication:   Total Time:  Documentation:  Face to Face:  Family/Phone:  Vikki Ports, NP-C [facility] [address], [place] [address] Cell Phone (Mon-Fri 8am-5pm):  [phone] On Call:  [phone] & follow prompts after 5pm & weekends Office Phone:  [phone] Office Fax:   [phone]

## 2017-05-02 ENCOUNTER — Other Ambulatory Visit: Payer: Self-pay

## 2017-05-02 DIAGNOSIS — M25561 Pain in right knee: Secondary | ICD-10-CM | POA: Diagnosis not present

## 2017-05-02 DIAGNOSIS — M6281 Muscle weakness (generalized): Secondary | ICD-10-CM | POA: Diagnosis not present

## 2017-05-02 DIAGNOSIS — Z96651 Presence of right artificial knee joint: Secondary | ICD-10-CM | POA: Diagnosis not present

## 2017-05-02 DIAGNOSIS — R262 Difficulty in walking, not elsewhere classified: Secondary | ICD-10-CM | POA: Diagnosis not present

## 2017-05-02 NOTE — Patient Outreach (Addendum)
[facility]) Care Management  05/02/2017  Amanda Browning 1946-02-14 [id number]   Referral Date: 05/02/17  Referral Source: [employer] report  Date of Admission: 04/20/17 Diagnosis: Right total knee replacement Date of Discharge: 04/29/17 Facility: [place]: [facility]  Outreach attempt # 1 spoke with patient and she is able to verify HIPAA.  Patient confirms leaving the facility on 04/29/17.    Social: Patient lives alone but daughters lives on either side of her.  She is independent with activities of daily living.  Her daughters help as they can.   Conditions: Patient underwent a right TKR replacement on 04-17-17 and went to [facility] for rehab.  Patient also has history of HTN, GERD, and hypercholesterolemia.  Medications: Patient takes all her medications as ordered and voices no concerns.    Appointments:  Patient to see surgeon today for staple removal and advised to discuss rehab at home.  Patient states she will.  Patient advised to set up appointment with primary physician to reestablish care.  She verbalized understanding.   Consent: RN CM reviewed [facility] services with patient. Patient declined services at this time.    Plan: RN CM will send letter and brochure for future reference. RN CM will close case and notify care management assistant of case status.     Jone Baseman, RN, MSN [facility] Care Management Care Management Coordinator Direct Line [phone] Toll Free: [phone]  Fax: [phone]

## 2017-05-05 DIAGNOSIS — M25561 Pain in right knee: Secondary | ICD-10-CM | POA: Diagnosis not present

## 2017-05-05 DIAGNOSIS — Z96651 Presence of right artificial knee joint: Secondary | ICD-10-CM | POA: Diagnosis not present

## 2017-05-09 DIAGNOSIS — Z96651 Presence of right artificial knee joint: Secondary | ICD-10-CM | POA: Diagnosis not present

## 2017-05-11 DIAGNOSIS — Z96651 Presence of right artificial knee joint: Secondary | ICD-10-CM | POA: Diagnosis not present

## 2017-05-11 DIAGNOSIS — M6281 Muscle weakness (generalized): Secondary | ICD-10-CM | POA: Diagnosis not present

## 2017-05-17 DIAGNOSIS — Z96651 Presence of right artificial knee joint: Secondary | ICD-10-CM | POA: Diagnosis not present

## 2017-05-17 DIAGNOSIS — M6281 Muscle weakness (generalized): Secondary | ICD-10-CM | POA: Diagnosis not present

## 2017-05-19 DIAGNOSIS — Z96651 Presence of right artificial knee joint: Secondary | ICD-10-CM | POA: Diagnosis not present

## 2017-05-19 DIAGNOSIS — M6281 Muscle weakness (generalized): Secondary | ICD-10-CM | POA: Diagnosis not present

## 2017-05-24 DIAGNOSIS — Z96651 Presence of right artificial knee joint: Secondary | ICD-10-CM | POA: Diagnosis not present

## 2017-06-01 DIAGNOSIS — Z96651 Presence of right artificial knee joint: Secondary | ICD-10-CM | POA: Diagnosis not present

## 2017-06-16 ENCOUNTER — Telehealth: Payer: Self-pay | Admitting: Internal Medicine

## 2017-06-16 MED ORDER — LISINOPRIL-HYDROCHLOROTHIAZIDE 20-25 MG PO TABS: 1.0000 | ORAL_TABLET | Freq: Every day | ORAL | 1 refills | Status: DC

## 2017-06-16 NOTE — Telephone Encounter (Signed)
Lisinopril-HCTZ refill Last OV: 01/20/17 Last Refill:"historical med" "03/30/17 rec'd from [facility]

## 2017-06-16 NOTE — Telephone Encounter (Signed)
Copied from [place] [phone]. Topic: Quick Communication - Rx Refill/Question >> Jun 16, 2017  8:07 AM Scherrie Gerlach wrote: Medication: lisinopril-hydrochlorothiazide (PRINZIDE,ZESTORETIC) 20-25 MG tablet   Has the patient contacted their pharmacy? Yes, but pt received a letter that they have been trying to contact the office and no response  90 days [place], [place] [phone] (Phone) [phone] (Fax)

## 2017-06-26 ENCOUNTER — Other Ambulatory Visit: Payer: Self-pay | Admitting: Internal Medicine

## 2017-06-30 IMAGING — MG MM SCREENING BREAST TOMO BILATERAL
8 of 14 series · 8 of 30 positions shown · non-contrast
Comparison: Previous exam(s).

CLINICAL DATA: Screening.

EXAM:
DIGITAL SCREENING BILATERAL MAMMOGRAM WITH 3D TOMO WITH CAD

[L CC (1 of 2)]
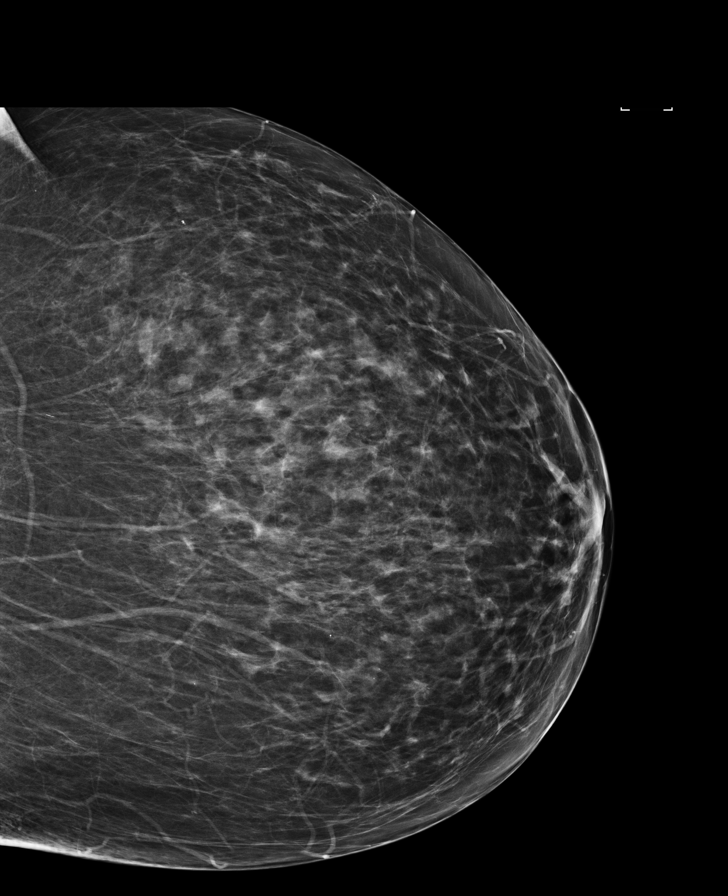

[R CC]
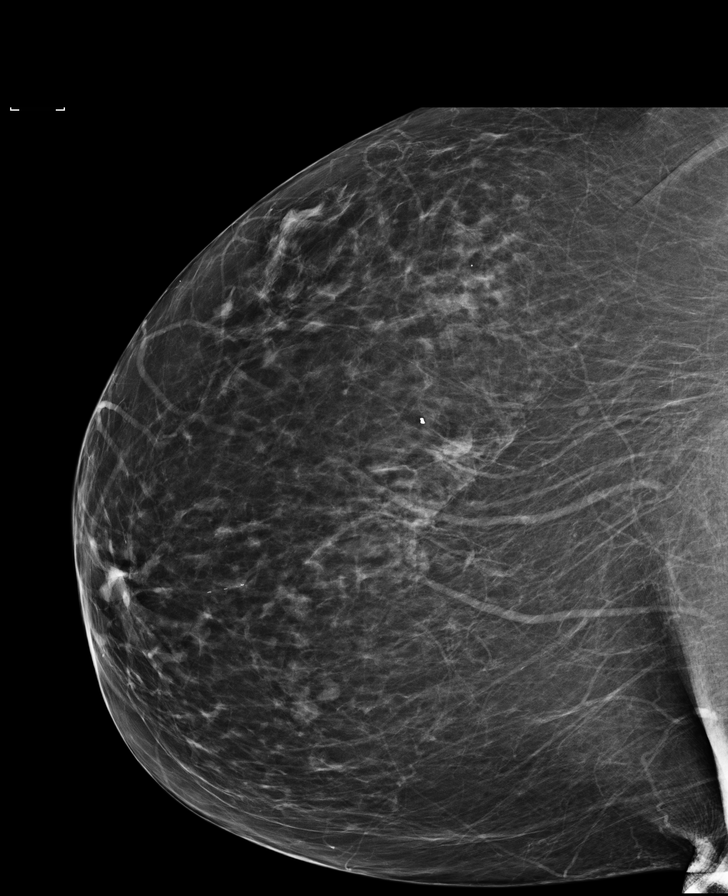

[L CC synth-2D]
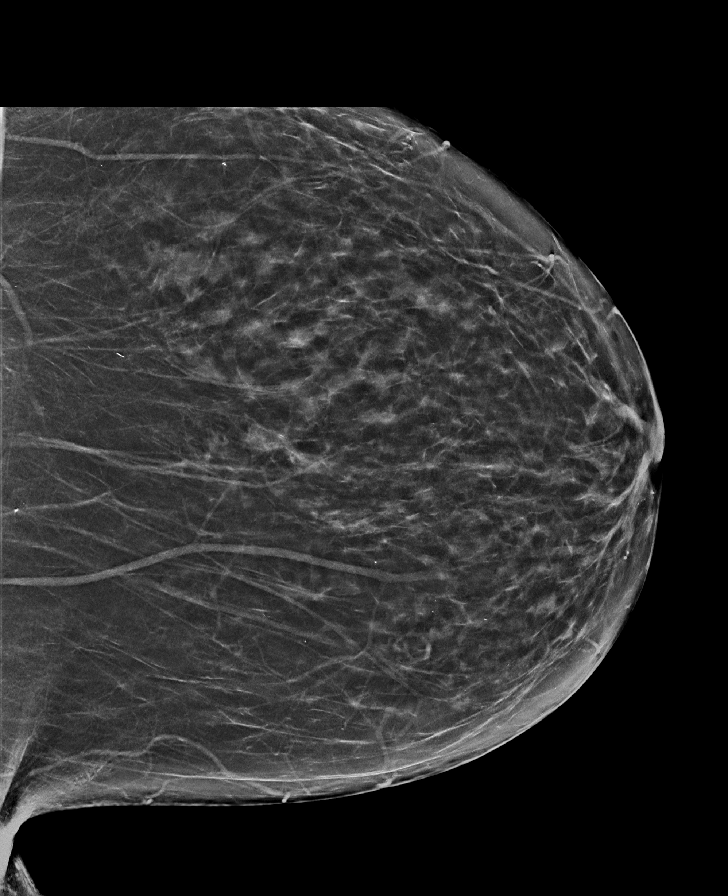

[L CC (2 of 2)]
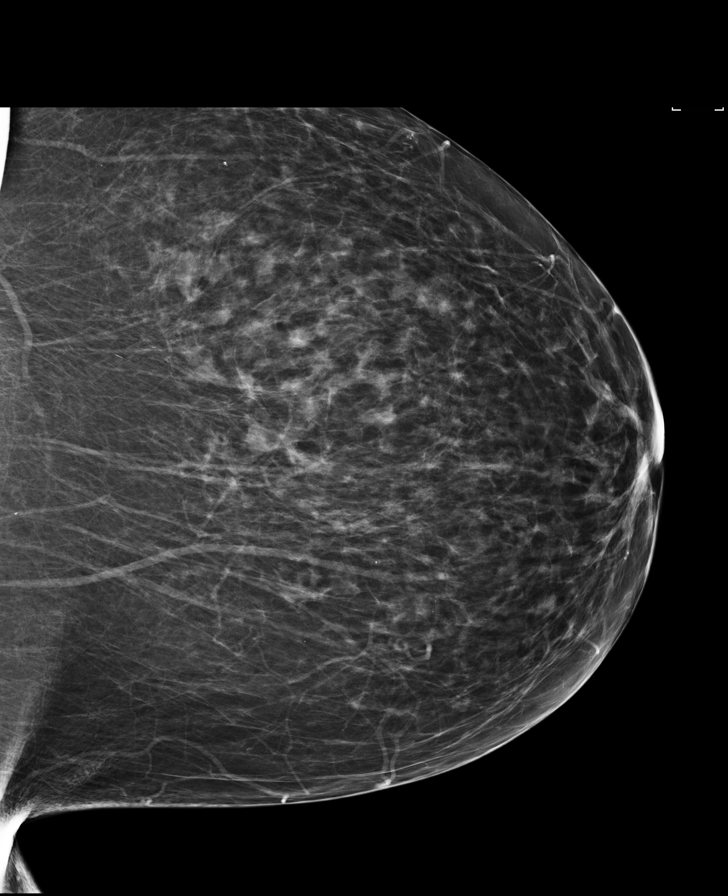

[R MLO]
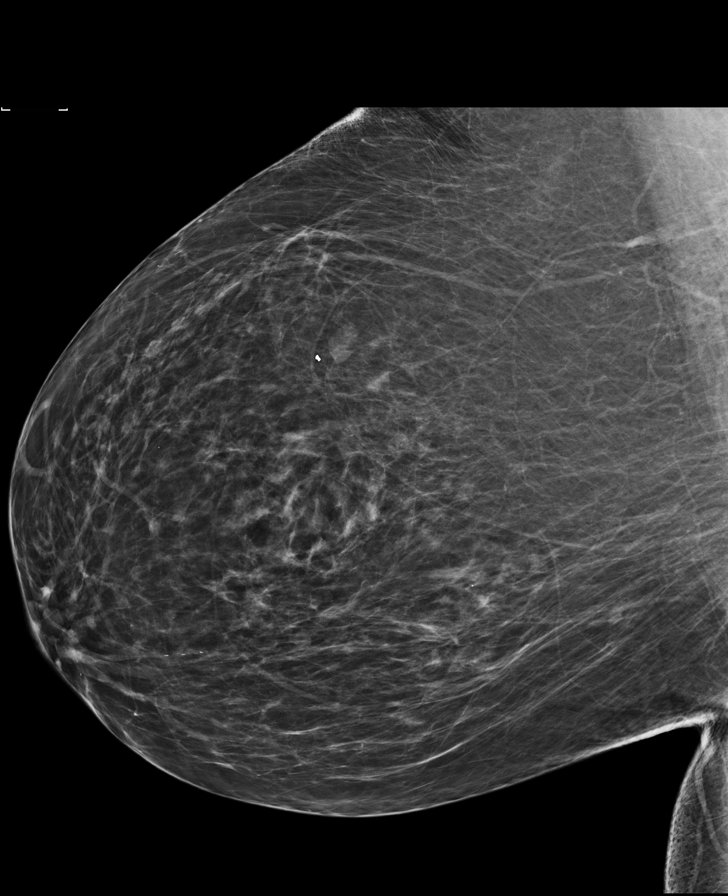

[R MLO synth-2D]
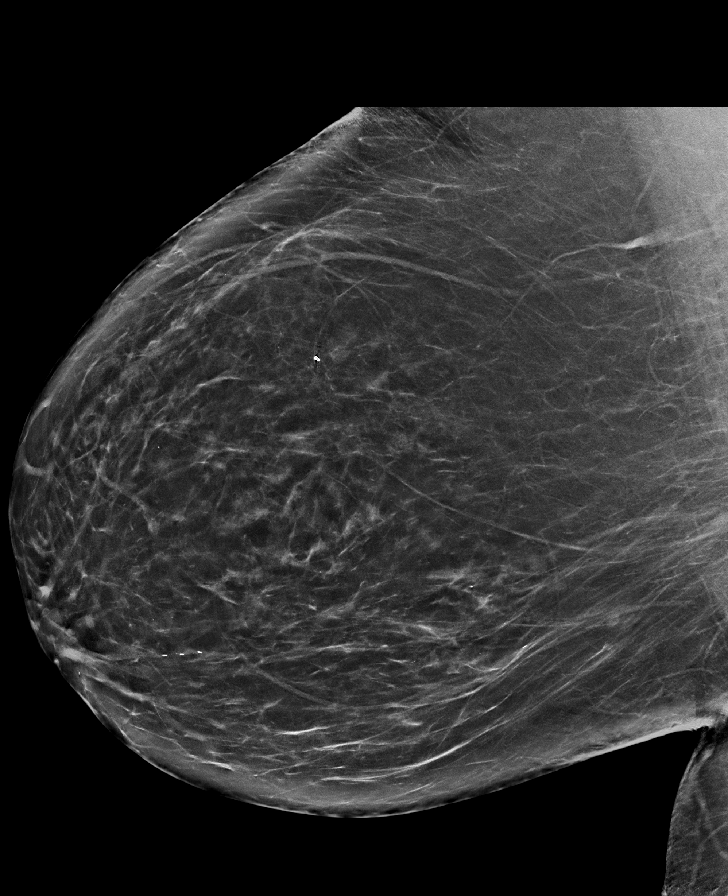

[L MLO]
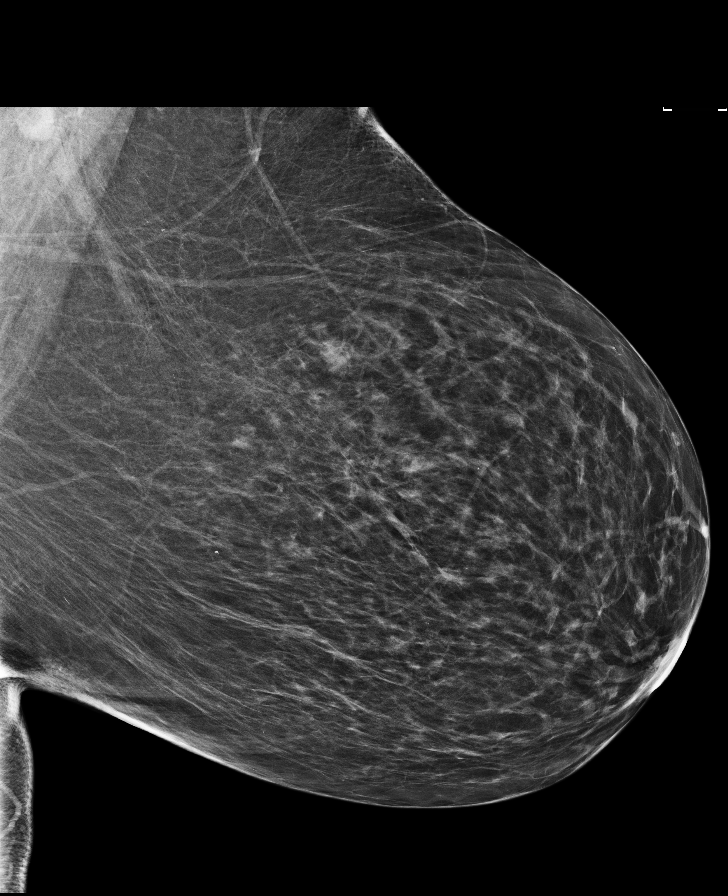

[L MLO synth-2D]
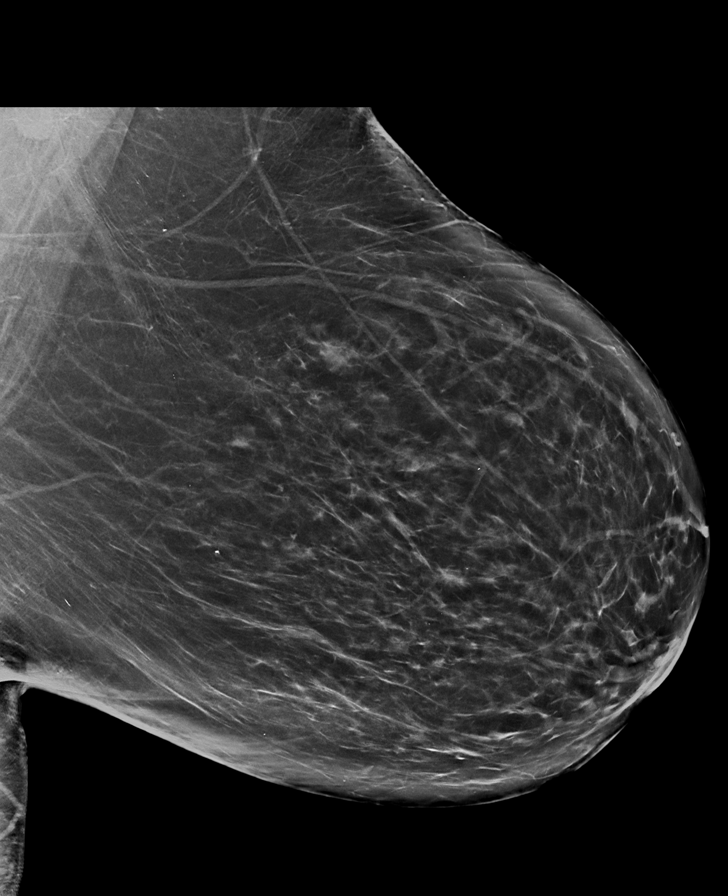

[8 of 30 positions shown; findings below may reference images not displayed]

ACR Breast Density Category b: There are scattered areas of
fibroglandular density.
FINDINGS: There are no findings suspicious for malignancy. Images were
processed with CAD.
IMPRESSION: No mammographic evidence of malignancy. A result letter of this
screening mammogram will be mailed directly to the patient.

RECOMMENDATION:
Screening mammogram in one year. (Code:55-L-23V)

BI-RADS CATEGORY  1: Negative.

## 2017-07-06 ENCOUNTER — Ambulatory Visit: Payer: Medicare HMO | Admitting: Family Medicine

## 2017-07-25 ENCOUNTER — Ambulatory Visit: Payer: Medicare HMO

## 2017-07-28 ENCOUNTER — Encounter: Payer: Self-pay | Admitting: Internal Medicine

## 2017-07-28 ENCOUNTER — Ambulatory Visit (INDEPENDENT_AMBULATORY_CARE_PROVIDER_SITE_OTHER): Payer: Medicare HMO | Admitting: Internal Medicine

## 2017-07-28 ENCOUNTER — Ambulatory Visit (INDEPENDENT_AMBULATORY_CARE_PROVIDER_SITE_OTHER): Payer: Medicare HMO

## 2017-07-28 VITALS — BP 142/88 | HR 65 | Temp 98.2°F | Resp 16 | Ht 61.0 in | Wt 231.0 lb

## 2017-07-28 DIAGNOSIS — L989 Disorder of the skin and subcutaneous tissue, unspecified: Secondary | ICD-10-CM | POA: Diagnosis not present

## 2017-07-28 DIAGNOSIS — E039 Hypothyroidism, unspecified: Secondary | ICD-10-CM

## 2017-07-28 DIAGNOSIS — K219 Gastro-esophageal reflux disease without esophagitis: Secondary | ICD-10-CM

## 2017-07-28 DIAGNOSIS — Z0000 Encounter for general adult medical examination without abnormal findings: Secondary | ICD-10-CM

## 2017-07-28 DIAGNOSIS — I10 Essential (primary) hypertension: Secondary | ICD-10-CM | POA: Diagnosis not present

## 2017-07-28 DIAGNOSIS — G479 Sleep disorder, unspecified: Secondary | ICD-10-CM

## 2017-07-28 DIAGNOSIS — M1711 Unilateral primary osteoarthritis, right knee: Secondary | ICD-10-CM

## 2017-07-28 DIAGNOSIS — D649 Anemia, unspecified: Secondary | ICD-10-CM | POA: Diagnosis not present

## 2017-07-28 DIAGNOSIS — E7800 Pure hypercholesterolemia, unspecified: Secondary | ICD-10-CM | POA: Diagnosis not present

## 2017-07-28 LAB — HEPATIC FUNCTION PANEL
ALBUMIN: 3.9 g/dL (ref 3.5–5.2)
ALT: 16 U/L (ref 0–35)
AST: 19 U/L (ref 0–37)
Alkaline Phosphatase: 57 U/L (ref 39–117)
Bilirubin, Direct: 0.1 mg/dL (ref 0.0–0.3)
Total Bilirubin: 0.7 mg/dL (ref 0.2–1.2)
Total Protein: 6.6 g/dL (ref 6.0–8.3)

## 2017-07-28 LAB — BASIC METABOLIC PANEL
BUN: 17 mg/dL (ref 6–23)
CO2: 31 meq/L (ref 19–32)
CREATININE: 0.89 mg/dL (ref 0.40–1.20)
Calcium: 9.5 mg/dL (ref 8.4–10.5)
Chloride: 100 mEq/L (ref 96–112)
GFR: 66.34 mL/min (ref >60.00)
GLUCOSE: 88 mg/dL (ref 70–99)
Potassium: 3.8 mEq/L (ref 3.5–5.1)
Sodium: 138 mEq/L (ref 135–145)

## 2017-07-28 LAB — LIPID PANEL
CHOLESTEROL: 183 mg/dL (ref 0–200)
HDL: 76.2 mg/dL (ref >39.00)
LDL Cholesterol: 92 mg/dL (ref 0–99)
NonHDL: 106.89
Total CHOL/HDL Ratio: 2
Triglycerides: 75 mg/dL (ref 0.0–149.0)
VLDL: 15 mg/dL (ref 0.0–40.0)

## 2017-07-28 MED ORDER — OMEPRAZOLE 20 MG PO CPDR: 20.0000 mg | DELAYED_RELEASE_CAPSULE | Freq: Two times a day (BID) | ORAL | 1 refills | Status: DC

## 2017-07-28 NOTE — Progress Notes (Signed)
Patient ID: Amanda Browning, female   DOB: [birth year], 72 y.o.   MRN: [id number]   Subjective:    Patient ID: Amanda Browning, female    DOB: 1946/03/13, 72 y.o.   MRN: [id number]  HPI  Patient here for a scheduled follow up.  S/p TKR 04/17/17.  Has had therapy.  Following with ortho.  Doing well.  Decreased pain now.  Able to be more active.  No chest pain.  No sob.  No acid reflux.  No abdominal pain.  Bowels moving.  No urine change.  She is going back to the gym.  Still has some issues with sleeping.  Was going to start back on trazodone.  Discussed melatonin.  She wants to try melatonin.  She reports some decrease in core muscle strength.  Discussed referral to physical therapy.  She declines at this time.  Request referral to a different dermatologist for nodule above her eye.  States her current dermatologist is out of net work.     Past Medical History:  Diagnosis Date  . Anemia   . Chicken pox   . Colon polyps   . Complication of anesthesia    doesn't take much anesthesia to put pt to sleep, slow to wake up  . DDD (degenerative disc disease), cervical 02/05/13   C5-C6 and C6-C7  . GERD (gastroesophageal reflux disease)   . History of chicken pox   . Hypercholesterolemia   . Hyperlipidemia   . Hypertension   . Hypothyroidism   . Migraines   . PONV (postoperative nausea and vomiting)   . Squamous cell skin cancer    Past Surgical History:  Procedure Laterality Date  . BREAST BIOPSY Right 2012   stereotactic  . KNEE ARTHROPLASTY Right 04/17/2017   Procedure: COMPUTER ASSISTED TOTAL KNEE ARTHROPLASTY;  Surgeon: Dereck Leep, MD;  Location: [facility] ORS;  Service: Orthopedics;  Laterality: Right;  . SKIN BIOPSY    . VAGINAL HYSTERECTOMY  1994   ovaries not removed  . WISDOM TOOTH EXTRACTION     Family History  Problem Relation Age of Onset  . Heart disease Mother   . Stroke Mother   . Hypertension Mother   . Heart attack Mother   . Heart disease Father    enlarged heart  . Stroke Father   . Hypertension Father   . Hyperlipidemia Father   . Heart attack Father   . Ovarian cancer Paternal Aunt   . Breast cancer Sister 19  . Cancer Sister        Breast Cancer  . Diabetes type II Brother   . Hypertension Brother   . Colon cancer Neg Hx   . Basal cell carcinoma Neg Hx   . Melanoma Neg Hx   . Squamous cell carcinoma Neg Hx    Social History   Socioeconomic History  . Marital status: Divorced    Spouse name: Not on file  . Number of children: 2  . Years of education: 8  . Highest education level: High school graduate  Occupational History  . Not on file  Social Needs  . Financial resource strain: Not hard at all  . Food insecurity:    Worry: Never true    Inability: Never true  . Transportation needs:    Medical: No    Non-medical: No  Tobacco Use  . Smoking status: Never Smoker  . Smokeless tobacco: Never Used  Substance and Sexual Activity  . Alcohol use: No    Alcohol/week:  0.0 oz  . Drug use: No  . Sexual activity: Never  Lifestyle  . Physical activity:    Days per week: 4 days    Minutes per session: 30 min  . Stress: Not at all  Relationships  . Social connections:    Talks on phone: Not on file    Gets together: Not on file    Attends religious service: Not on file    Active member of club or organization: Not on file    Attends meetings of clubs or organizations: Not on file    Relationship status: Not on file  Other Topics Concern  . Not on file  Social History Narrative   Full Code   Alcohol - none   Never smoker and no smokeless tobacco   2 daughters    Outpatient Encounter Medications as of 07/28/2017  Medication Sig  . acetaminophen (TYLENOL) 325 MG tablet Take 650 mg by mouth 4 (four) times daily as needed.  Marland Kitchen aspirin EC 81 MG tablet Take by mouth.  . cetirizine (ZYRTEC) 10 MG tablet Take 10 mg by mouth daily.  Marland Kitchen glucosamine-chondroitin 500-400 MG tablet Take 1 tablet by mouth daily.  Marland Kitchen  levothyroxine (SYNTHROID, LEVOTHROID) 25 MCG tablet Take 25 mcg by mouth daily before breakfast.  . lisinopril-hydrochlorothiazide (PRINZIDE,ZESTORETIC) 20-25 MG tablet Take 1 tablet by mouth daily.  Marland Kitchen lovastatin (MEVACOR) 40 MG tablet TAKE 1 TABLET AT BEDTIME  . magnesium oxide (MAG-OX) 400 MG tablet Take 1 tablet (400 mg total) by mouth 2 (two) times daily.  . Multiple Vitamin (MULTIVITAMIN) tablet Take 1 tablet by mouth daily.  Marland Kitchen omeprazole (PRILOSEC) 20 MG capsule Take 1 capsule (20 mg total) by mouth 2 (two) times daily before a meal.  . vitamin B-12 (CYANOCOBALAMIN) 1000 MCG tablet Take 1 tablet by mouth daily.  . [DISCONTINUED] enoxaparin (LOVENOX) 30 MG/0.3ML injection Inject 0.3 mLs (30 mg total) into the skin every 12 (twelve) hours.  . [DISCONTINUED] omeprazole (PRILOSEC) 20 MG capsule Take 20 mg by mouth daily.  . [DISCONTINUED] traMADol (ULTRAM) 50 MG tablet Take 1-2 tablets (50-100 mg total) by mouth every 4 (four) hours as needed for moderate pain. (Patient not taking: Reported on 05/02/2017)   No facility-administered encounter medications on file as of 07/28/2017.     Review of Systems  Constitutional: Negative for appetite change and unexpected weight change.  HENT: Negative for congestion and sinus pressure.   Respiratory: Negative for cough, chest tightness and shortness of breath.   Cardiovascular: Negative for chest pain, palpitations and leg swelling.  Gastrointestinal: Negative for abdominal pain, diarrhea, nausea and vomiting.  Genitourinary: Negative for difficulty urinating and dysuria.  Musculoskeletal: Negative for myalgias.       S/p knee replacement.  Knee is doing well.  Decreased pain.  No increased swelling.   Skin: Negative for color change and rash.  Neurological: Negative for dizziness, light-headedness and headaches.  Psychiatric/Behavioral: Negative for agitation and dysphoric mood.       Objective:     Blood pressure rechecked by me:   138/84  Physical Exam  Constitutional: She appears well-developed and well-nourished. No distress.  HENT:  Nose: Nose normal.  Mouth/Throat: Oropharynx is clear and moist.  Neck: Neck supple. No thyromegaly present.  Cardiovascular: Normal rate and regular rhythm.  Pulmonary/Chest: Breath sounds normal. No respiratory distress. She has no wheezes.  Abdominal: Soft. Bowel sounds are normal. There is no tenderness.  Musculoskeletal: She exhibits no edema or tenderness.  Lymphadenopathy:  She has no cervical adenopathy.  Skin: No rash noted. No erythema.  Psychiatric: She has a normal mood and affect. Her behavior is normal.    BP 138/84   Pulse 65   Temp 98.2 F (36.8 C) (Oral)   Resp 16   Wt 213 lb (96.6 kg)   SpO2 97%   BMI 37.14 kg/m  Wt Readings from Last 3 Encounters:  07/28/17 231 lb (104.8 kg)  07/28/17 213 lb (96.6 kg)  04/27/17 218 lb 11.2 oz (99.2 kg)     Lab Results  Component Value Date   WBC 5.9 04/05/2017   HGB 13.6 04/05/2017   HCT 40.5 04/05/2017   PLT 214 04/05/2017   GLUCOSE 88 07/28/2017   CHOL 183 07/28/2017   TRIG 75.0 07/28/2017   HDL 76.20 07/28/2017   LDLDIRECT 131.0 03/19/2013   LDLCALC 92 07/28/2017   ALT 16 07/28/2017   AST 19 07/28/2017   NA 138 07/28/2017   K 3.8 07/28/2017   CL 100 07/28/2017   CREATININE 0.89 07/28/2017   BUN 17 07/28/2017   CO2 31 07/28/2017   TSH 2.44 01/20/2017   INR 0.99 04/05/2017       Assessment & Plan:   Problem List Items Addressed This Visit    Anemia    Follow cbc.        Difficulty sleeping    Hold on trazodone.  Trial of melatonin.  Follow.        GERD (gastroesophageal reflux disease)    Controlled on current regimen.  Follow.       Relevant Medications   omeprazole (PRILOSEC) 20 MG capsule   Hypercholesterolemia    On lovastatin.  Low cholesterol diet and exercise.  Follow lipid panel and liver function tests.        Relevant Medications   aspirin EC 81 MG tablet   Other  Relevant Orders   Hepatic function panel (Completed)   Lipid panel (Completed)   Hypertension    Blood pressure on recheck improved.  Continue same medication regimen.  Follow pressures.  Follow metabolic panel.       Relevant Medications   aspirin EC 81 MG tablet   Other Relevant Orders   Basic metabolic panel (Completed)   Hypothyroidism    On thyroid replacement.  Follow tsh.        Primary osteoarthritis of right knee    S/p surgery and doing well.  Follow.        Relevant Medications   aspirin EC 81 MG tablet   Skin lesion    Persistent nodule/lesion over right eye.  Request referral to another dermatologist.  Her dermatologist is out of network.        Relevant Orders   Ambulatory referral to Dermatology   Ambulatory referral to Dermatology       Einar Pheasant, MD

## 2017-07-28 NOTE — Patient Instructions (Addendum)
  Amanda Browning , Thank you for taking time to come for your Medicare Wellness Visit. I appreciate your ongoing commitment to your health goals. Please review the following plan we discussed and let me know if I can assist you in the future.   These are the goals we discussed: Goals    . DIET - REDUCE PORTION SIZE       This is a list of the screening recommended for you and due dates:  Health Maintenance  Topic Date Due  . Tetanus Vaccine  10/15/2017*  .  Hepatitis C: One time screening is recommended by Center for Disease Control  (CDC) for  adults born from [birth year] through 1965.   10/15/2017*  . Flu Shot  11/16/2017  . Mammogram  03/22/2018  . Colon Cancer Screening  11/20/2022  . DEXA scan (bone density measurement)  Completed  . Pneumonia vaccines  Completed  *Topic was postponed. The date shown is not the original due date.

## 2017-07-28 NOTE — Progress Notes (Addendum)
Subjective:   Amanda Browning is a 72 y.o. female who presents for Medicare Annual (Subsequent) preventive examination.  Review of Systems:  No ROS.  Medicare Wellness Visit. Additional risk factors are reflected in the social history.  Cardiac Risk Factors include: advanced age (>44men, >53 women);hypertension     Objective:     Vitals: BP (!) 142/88 (BP Location: Left Arm, Patient Position: Sitting, Cuff Size: Normal)   Pulse 65   Temp 98.2 F (36.8 C) (Oral)   Resp 16   Ht 5\' 1"  (1.549 m)   Wt 231 lb (104.8 kg)   SpO2 97%   BMI 43.65 kg/m   Body mass index is 43.65 kg/m.  Advanced Directives 07/28/2017 05/02/2017 04/27/2017 04/17/2017 04/17/2017 04/05/2017 07/22/2016  Does Patient Have a Medical Advance Directive? No No No No No No No  Does patient want to make changes to medical advance directive? No - Patient declined - - - - - -  Would patient like information on creating a medical advance directive? - No - Patient declined No - Patient declined No - Patient declined No - Patient declined No - Patient declined No - Patient declined    Tobacco Social History   Tobacco Use  Smoking Status Never Smoker  Smokeless Tobacco Never Used     Counseling given: Not Answered   Clinical Intake:  Pre-visit preparation completed: Yes  Pain : No/denies pain     Diabetes: No  How often do you need to have someone help you when you read instructions, pamphlets, or other written materials from your doctor or pharmacy?: 1 - Never  Interpreter Needed?: No     Past Medical History:  Diagnosis Date  . Anemia   . Chicken pox   . Colon polyps   . Complication of anesthesia    doesn't take much anesthesia to put pt to sleep, slow to wake up  . DDD (degenerative disc disease), cervical 02/05/13   C5-C6 and C6-C7  . GERD (gastroesophageal reflux disease)   . History of chicken pox   . Hypercholesterolemia   . Hyperlipidemia   . Hypertension   . Hypothyroidism   .  Migraines   . PONV (postoperative nausea and vomiting)   . Squamous cell skin cancer    Past Surgical History:  Procedure Laterality Date  . BREAST BIOPSY Right 2012   stereotactic  . KNEE ARTHROPLASTY Right 04/17/2017   Procedure: COMPUTER ASSISTED TOTAL KNEE ARTHROPLASTY;  Surgeon: Dereck Leep, MD;  Location: [facility] ORS;  Service: Orthopedics;  Laterality: Right;  . SKIN BIOPSY    . VAGINAL HYSTERECTOMY  1994   ovaries not removed  . WISDOM TOOTH EXTRACTION     Family History  Problem Relation Age of Onset  . Heart disease Mother   . Stroke Mother   . Hypertension Mother   . Heart attack Mother   . Heart disease Father        enlarged heart  . Stroke Father   . Hypertension Father   . Hyperlipidemia Father   . Heart attack Father   . Ovarian cancer Paternal Aunt   . Breast cancer Sister 2  . Cancer Sister        Breast Cancer  . Diabetes type II Brother   . Hypertension Brother   . Colon cancer Neg Hx   . Basal cell carcinoma Neg Hx   . Melanoma Neg Hx   . Squamous cell carcinoma Neg Hx    Social History  Socioeconomic History  . Marital status: Divorced    Spouse name: Not on file  . Number of children: 2  . Years of education: 40  . Highest education level: High school graduate  Occupational History  . Not on file  Social Needs  . Financial resource strain: Not hard at all  . Food insecurity:    Worry: Never true    Inability: Never true  . Transportation needs:    Medical: No    Non-medical: No  Tobacco Use  . Smoking status: Never Smoker  . Smokeless tobacco: Never Used  Substance and Sexual Activity  . Alcohol use: No    Alcohol/week: 0.0 oz  . Drug use: No  . Sexual activity: Never  Lifestyle  . Physical activity:    Days per week: 4 days    Minutes per session: 30 min  . Stress: Not at all  Relationships  . Social connections:    Talks on phone: Not on file    Gets together: Not on file    Attends religious service: Not on file     Active member of club or organization: Not on file    Attends meetings of clubs or organizations: Not on file    Relationship status: Not on file  Other Topics Concern  . Not on file  Social History Narrative   Full Code   Alcohol - none   Never smoker and no smokeless tobacco   2 daughters    Outpatient Encounter Medications as of 07/28/2017  Medication Sig  . acetaminophen (TYLENOL) 325 MG tablet Take 650 mg by mouth 4 (four) times daily as needed.  Marland Kitchen aspirin EC 81 MG tablet Take by mouth.  . cetirizine (ZYRTEC) 10 MG tablet Take 10 mg by mouth daily.  Marland Kitchen glucosamine-chondroitin 500-400 MG tablet Take 1 tablet by mouth daily.  Marland Kitchen levothyroxine (SYNTHROID, LEVOTHROID) 25 MCG tablet Take 25 mcg by mouth daily before breakfast.  . lisinopril-hydrochlorothiazide (PRINZIDE,ZESTORETIC) 20-25 MG tablet Take 1 tablet by mouth daily.  Marland Kitchen lovastatin (MEVACOR) 40 MG tablet TAKE 1 TABLET AT BEDTIME  . magnesium oxide (MAG-OX) 400 MG tablet Take 1 tablet (400 mg total) by mouth 2 (two) times daily.  . Multiple Vitamin (MULTIVITAMIN) tablet Take 1 tablet by mouth daily.  Marland Kitchen omeprazole (PRILOSEC) 20 MG capsule Take 1 capsule (20 mg total) by mouth 2 (two) times daily before a meal.  . vitamin B-12 (CYANOCOBALAMIN) 1000 MCG tablet Take 1 tablet by mouth daily.   No facility-administered encounter medications on file as of 07/28/2017.     Activities of Daily Living In your present state of health, do you have any difficulty performing the following activities: 07/28/2017 04/17/2017  Hearing? N -  Vision? N -  Comment - -  Difficulty concentrating or making decisions? N -  Walking or climbing stairs? N -  Comment - -  Dressing or bathing? N -  Doing errands, shopping? N N  Preparing Food and eating ? N -  Using the Toilet? N -  In the past six months, have you accidently leaked urine? N -  Do you have problems with loss of bowel control? N -  Managing your Medications? N -  Managing your  Finances? N -  Housekeeping or managing your Housekeeping? N -  Some recent data might be hidden    Patient Care Team: Einar Pheasant, MD as PCP - General (Internal Medicine)    Assessment:   This is a routine wellness examination  for [place].  The goal of the wellness visit is to assist the patient how to close the gaps in care and create a preventative care plan for the patient.   The roster of all physicians providing medical care to patient is listed in the Snapshot section of the chart.  Osteoporosis risk reviewed.    Safety issues reviewed; Smoke and carbon monoxide detectors in the home. No firearms in the home. Wears seatbelts when driving or riding with others. No violence in the home.  They do not have excessive sun exposure.  Discussed the need for sun protection: hats, long sleeves and the use of sunscreen if there is significant sun exposure.  Patient is alert, normal appearance, oriented to person/place/and time.  Correctly identified the president of the [place] and recalls of 3/3 words. Performs simple calculations and can read correct time from watch face. Displays appropriate judgement.  No new identified risk were noted.  No failures at ADL's or IADL's.    BMI- discussed the importance of a healthy diet, water intake and the benefits of aerobic exercise. Educational material provided.   24 hour diet recall: Regular diet  Dental- every 6 months.  Eye- Visual acuity not assessed per patient preference since they have regular follow up with the ophthalmologist.  Wears corrective lenses.  Health maintenance gaps- closed.  Patient Concerns: None at this time. Follow up with PCP as needed.  Exercise Activities and Dietary recommendations Current Exercise Habits: Home exercise routine, Type of exercise: calisthenics;stretching(stationary bike), Time (Minutes): 35, Frequency (Times/Week): 4, Weekly Exercise (Minutes/Week): 140, Intensity: Mild  Goals    . DIET  - REDUCE PORTION SIZE       Fall Risk Fall Risk  07/22/2016 10/02/2015 07/17/2015 10/01/2014 08/13/2013  Falls in the past year? No No No No No   Depression Screen PHQ 2/9 Scores 07/28/2017 07/22/2016 10/02/2015 07/17/2015  PHQ - 2 Score 0 0 0 0     Cognitive Function MMSE - Mini Mental State Exam 07/17/2015  Orientation to time 5  Orientation to Place 5  Registration 3  Attention/ Calculation 5  Recall 3  Language- name 2 objects 2  Language- repeat 1  Language- follow 3 step command 3  Language- read & follow direction 1  Write a sentence 1  Copy design 1  Total score 30     6CIT Screen 07/28/2017  What Year? 0 points  What month? 0 points  What time? 0 points  Count back from 20 0 points  Months in reverse 0 points  Repeat phrase 0 points  Total Score 0    Immunization History  Administered Date(s) Administered  . Influenza Split 04/03/2012  . Influenza, High Dose Seasonal PF 01/20/2017  . Influenza,inj,Quad PF,6+ Mos 01/15/2013, 01/23/2014, 12/10/2014  . Pneumococcal Conjugate-13 01/23/2014  . Pneumococcal Polysaccharide-23 01/15/2013   Screening Tests Health Maintenance  Topic Date Due  . TETANUS/TDAP  10/15/2017 (Originally 12/31/1964)  . Hepatitis C Screening  10/15/2017 (Originally 1946-01-23)  . INFLUENZA VACCINE  11/16/2017  . MAMMOGRAM  03/22/2018  . COLONOSCOPY  11/20/2022  . DEXA SCAN  Completed  . PNA vac Low Risk Adult  Completed       Plan:   End of life planning; Advanced aging; Advanced directives discussed.  No HCPOA/Living Will.  Additional information declined at this time.  I have personally reviewed and noted the following in the patient's chart:   . Medical and social history . Use of alcohol, tobacco or illicit drugs  .  Current medications and supplements . Functional ability and status . Nutritional status . Physical activity . Advanced directives . List of other physicians . Hospitalizations, surgeries, and ER visits in previous 12  months . Vitals . Screenings to include cognitive, depression, and falls . Referrals and appointments  In addition, I have reviewed and discussed with patient certain preventive protocols, quality metrics, and best practice recommendations. A written personalized care plan for preventive services as well as general preventive health recommendations were provided to patient.     Varney Biles, LPN  0/[id number]/2836   Reviewed above information.  Agree with assessment and plan.    Dr Nicki Reaper

## 2017-07-30 ENCOUNTER — Encounter: Payer: Self-pay | Admitting: Internal Medicine

## 2017-07-30 NOTE — Assessment & Plan Note (Signed)
S/p surgery and doing well.  Follow.   

## 2017-07-30 NOTE — Assessment & Plan Note (Signed)
Hold on trazodone.  Trial of melatonin.  Follow.

## 2017-07-30 NOTE — Assessment & Plan Note (Signed)
Controlled on current regimen.  Follow.  

## 2017-07-30 NOTE — Assessment & Plan Note (Signed)
Follow cbc.  

## 2017-07-30 NOTE — Assessment & Plan Note (Signed)
On lovastatin.  Low cholesterol diet and exercise.  Follow lipid panel and liver function tests.   

## 2017-07-30 NOTE — Assessment & Plan Note (Signed)
On thyroid replacement.  Follow tsh.  

## 2017-07-30 NOTE — Assessment & Plan Note (Signed)
Blood pressure on recheck improved.  Continue same medication regimen.  Follow pressures.  Follow metabolic panel.   

## 2017-07-30 NOTE — Assessment & Plan Note (Signed)
Persistent nodule/lesion over right eye.  Request referral to another dermatologist.  Her dermatologist is out of network.

## 2017-11-06 ENCOUNTER — Other Ambulatory Visit: Payer: Self-pay | Admitting: Internal Medicine

## 2017-11-29 DIAGNOSIS — D2371 Other benign neoplasm of skin of right lower limb, including hip: Secondary | ICD-10-CM | POA: Diagnosis not present

## 2017-11-29 DIAGNOSIS — X32XXXA Exposure to sunlight, initial encounter: Secondary | ICD-10-CM | POA: Diagnosis not present

## 2017-11-29 DIAGNOSIS — D485 Neoplasm of uncertain behavior of skin: Secondary | ICD-10-CM | POA: Diagnosis not present

## 2017-11-29 DIAGNOSIS — L570 Actinic keratosis: Secondary | ICD-10-CM | POA: Diagnosis not present

## 2017-12-06 DIAGNOSIS — Z86010 Personal history of colonic polyps: Secondary | ICD-10-CM | POA: Diagnosis not present

## 2017-12-11 ENCOUNTER — Other Ambulatory Visit: Payer: Self-pay | Admitting: Internal Medicine

## 2018-01-03 DIAGNOSIS — D2339 Other benign neoplasm of skin of other parts of face: Secondary | ICD-10-CM | POA: Diagnosis not present

## 2018-01-03 DIAGNOSIS — D485 Neoplasm of uncertain behavior of skin: Secondary | ICD-10-CM | POA: Diagnosis not present

## 2018-01-31 ENCOUNTER — Encounter: Payer: Self-pay | Admitting: Internal Medicine

## 2018-01-31 ENCOUNTER — Ambulatory Visit (INDEPENDENT_AMBULATORY_CARE_PROVIDER_SITE_OTHER): Payer: Medicare HMO | Admitting: Internal Medicine

## 2018-01-31 VITALS — BP 130/86 | HR 61 | Temp 97.9°F | Resp 18 | Wt 211.6 lb

## 2018-01-31 DIAGNOSIS — M25511 Pain in right shoulder: Secondary | ICD-10-CM | POA: Insufficient documentation

## 2018-01-31 DIAGNOSIS — I10 Essential (primary) hypertension: Secondary | ICD-10-CM

## 2018-01-31 DIAGNOSIS — Z96651 Presence of right artificial knee joint: Secondary | ICD-10-CM

## 2018-01-31 DIAGNOSIS — Z1239 Encounter for other screening for malignant neoplasm of breast: Secondary | ICD-10-CM

## 2018-01-31 DIAGNOSIS — E7800 Pure hypercholesterolemia, unspecified: Secondary | ICD-10-CM | POA: Diagnosis not present

## 2018-01-31 DIAGNOSIS — E039 Hypothyroidism, unspecified: Secondary | ICD-10-CM

## 2018-01-31 DIAGNOSIS — D649 Anemia, unspecified: Secondary | ICD-10-CM

## 2018-01-31 DIAGNOSIS — K219 Gastro-esophageal reflux disease without esophagitis: Secondary | ICD-10-CM

## 2018-01-31 DIAGNOSIS — Z23 Encounter for immunization: Secondary | ICD-10-CM

## 2018-01-31 LAB — BASIC METABOLIC PANEL
BUN: 11 mg/dL (ref 6–23)
CHLORIDE: 95 meq/L — AB (ref 96–112)
CO2: 33 meq/L — AB (ref 19–32)
Calcium: 10.1 mg/dL (ref 8.4–10.5)
Creatinine, Ser: 0.87 mg/dL (ref 0.40–1.20)
GFR: 68.01 mL/min (ref >60.00)
GLUCOSE: 93 mg/dL (ref 70–99)
Potassium: 4.1 mEq/L (ref 3.5–5.1)
Sodium: 135 mEq/L (ref 135–145)

## 2018-01-31 LAB — TSH: TSH: 2.93 u[IU]/mL (ref 0.35–4.50)

## 2018-01-31 LAB — CBC WITH DIFFERENTIAL/PLATELET
BASOS PCT: 0.9 % (ref 0.0–3.0)
Basophils Absolute: 0.1 10*3/uL (ref 0.0–0.1)
Eosinophils Absolute: 0.2 10*3/uL (ref 0.0–0.7)
Eosinophils Relative: 2.6 % (ref 0.0–5.0)
HEMATOCRIT: 40.8 % (ref 36.0–46.0)
Hemoglobin: 13.5 g/dL (ref 12.0–15.0)
LYMPHS ABS: 1.8 10*3/uL (ref 0.7–4.0)
LYMPHS PCT: 30.7 % (ref 12.0–46.0)
MCHC: 33.2 g/dL (ref 30.0–36.0)
MCV: 97.8 fl (ref 78.0–100.0)
MONOS PCT: 13.3 % — AB (ref 3.0–12.0)
Monocytes Absolute: 0.8 10*3/uL (ref 0.1–1.0)
NEUTROS PCT: 52.5 % (ref 43.0–77.0)
Neutro Abs: 3.1 10*3/uL (ref 1.4–7.7)
Platelets: 212 10*3/uL (ref 150.0–400.0)
RBC: 4.17 Mil/uL (ref 3.87–5.11)
RDW: 12 % (ref 11.5–15.5)
WBC: 6 10*3/uL (ref 4.0–10.5)

## 2018-01-31 LAB — HEPATIC FUNCTION PANEL
ALBUMIN: 4.2 g/dL (ref 3.5–5.2)
ALT: 16 U/L (ref 0–35)
AST: 17 U/L (ref 0–37)
Alkaline Phosphatase: 60 U/L (ref 39–117)
Bilirubin, Direct: 0.2 mg/dL (ref 0.0–0.3)
Total Bilirubin: 1 mg/dL (ref 0.2–1.2)
Total Protein: 6.8 g/dL (ref 6.0–8.3)

## 2018-01-31 LAB — LIPID PANEL
CHOLESTEROL: 211 mg/dL — AB (ref 0–200)
HDL: 82.8 mg/dL (ref >39.00)
LDL Cholesterol: 110 mg/dL — ABNORMAL HIGH (ref 0–99)
NonHDL: 128.32
TRIGLYCERIDES: 92 mg/dL (ref 0.0–149.0)
Total CHOL/HDL Ratio: 3
VLDL: 18.4 mg/dL (ref 0.0–40.0)

## 2018-01-31 LAB — MAGNESIUM: Magnesium: 1.6 mg/dL (ref 1.5–2.5)

## 2018-01-31 NOTE — Progress Notes (Signed)
Patient ID: Amanda Browning, female   DOB: Mar 07, 1946, 72 y.o.   MRN: [id number]   Subjective:    Patient ID: Amanda Browning, female    DOB: 14-Aug-1945, 72 y.o.   MRN: [id number]  HPI  Patient here for scheduled follow up. S/p TKR 04/17/17.  Knee is doing better.  Seeing ortho.  Tries to stay active.  No chest pain.  No sob.  No acid reflux.  No abdominal pain.  Bowels moving.  Did do a lot of painting last week.  Now having increased pain in her right shoulder.  Limited rom.  Using icy hot and ice. Taking tylenol.  Request referral to [facility].     Past Medical History:  Diagnosis Date  . Anemia   . Chicken pox   . Colon polyps   . Complication of anesthesia    doesn't take much anesthesia to put pt to sleep, slow to wake up  . DDD (degenerative disc disease), cervical 02/05/13   C5-C6 and C6-C7  . GERD (gastroesophageal reflux disease)   . History of chicken pox   . Hypercholesterolemia   . Hyperlipidemia   . Hypertension   . Hypothyroidism   . Migraines   . PONV (postoperative nausea and vomiting)   . Squamous cell skin cancer    Past Surgical History:  Procedure Laterality Date  . BREAST BIOPSY Right 2012   stereotactic  . KNEE ARTHROPLASTY Right 04/17/2017   Procedure: COMPUTER ASSISTED TOTAL KNEE ARTHROPLASTY;  Surgeon: Dereck Leep, MD;  Location: [facility] ORS;  Service: Orthopedics;  Laterality: Right;  . SKIN BIOPSY    . VAGINAL HYSTERECTOMY  1994   ovaries not removed  . WISDOM TOOTH EXTRACTION     Family History  Problem Relation Age of Onset  . Heart disease Mother   . Stroke Mother   . Hypertension Mother   . Heart attack Mother   . Heart disease Father        enlarged heart  . Stroke Father   . Hypertension Father   . Hyperlipidemia Father   . Heart attack Father   . Ovarian cancer Paternal Aunt   . Breast cancer Sister 72  . Cancer Sister        Breast Cancer  . Diabetes type II Brother   . Hypertension Brother   . Colon cancer Neg Hx   .  Basal cell carcinoma Neg Hx   . Melanoma Neg Hx   . Squamous cell carcinoma Neg Hx    Social History   Socioeconomic History  . Marital status: Divorced    Spouse name: Not on file  . Number of children: 2  . Years of education: 104  . Highest education level: High school graduate  Occupational History  . Not on file  Social Needs  . Financial resource strain: Not hard at all  . Food insecurity:    Worry: Never true    Inability: Never true  . Transportation needs:    Medical: No    Non-medical: No  Tobacco Use  . Smoking status: Never Smoker  . Smokeless tobacco: Never Used  Substance and Sexual Activity  . Alcohol use: No    Alcohol/week: 0.0 standard drinks  . Drug use: No  . Sexual activity: Never  Lifestyle  . Physical activity:    Days per week: 4 days    Minutes per session: 30 min  . Stress: Not at all  Relationships  . Social connections:  Talks on phone: Not on file    Gets together: Not on file    Attends religious service: Not on file    Active member of club or organization: Not on file    Attends meetings of clubs or organizations: Not on file    Relationship status: Not on file  Other Topics Concern  . Not on file  Social History Narrative   Full Code   Alcohol - none   Never smoker and no smokeless tobacco   2 daughters    Outpatient Encounter Medications as of 01/31/2018  Medication Sig  . acetaminophen (TYLENOL) 325 MG tablet Take 650 mg by mouth 4 (four) times daily as needed.  Marland Kitchen aspirin EC 81 MG tablet Take by mouth.  . cetirizine (ZYRTEC) 10 MG tablet Take 10 mg by mouth daily.  Marland Kitchen glucosamine-chondroitin 500-400 MG tablet Take 1 tablet by mouth daily.  Marland Kitchen levothyroxine (SYNTHROID, LEVOTHROID) 25 MCG tablet TAKE 1 TABLET EVERY DAY BEFORE BREAKFAST  . lisinopril-hydrochlorothiazide (PRINZIDE,ZESTORETIC) 20-25 MG tablet TAKE 1 TABLET EVERY DAY  . lovastatin (MEVACOR) 40 MG tablet TAKE 1 TABLET AT BEDTIME  . magnesium oxide (MAG-OX) 400 MG  tablet Take 1 tablet (400 mg total) by mouth 2 (two) times daily.  . Multiple Vitamin (MULTIVITAMIN) tablet Take 1 tablet by mouth daily.  Marland Kitchen omeprazole (PRILOSEC) 20 MG capsule TAKE 1 CAPSULE TWICE DAILY BEFORE A MEAL  . vitamin B-12 (CYANOCOBALAMIN) 1000 MCG tablet Take 1 tablet by mouth daily.   No facility-administered encounter medications on file as of 01/31/2018.     Review of Systems  Constitutional: Negative for appetite change and unexpected weight change.  HENT: Negative for congestion and sinus pressure.   Respiratory: Negative for cough, chest tightness and shortness of breath.   Cardiovascular: Negative for chest pain, palpitations and leg swelling.  Gastrointestinal: Negative for abdominal pain, diarrhea, nausea and vomiting.  Genitourinary: Negative for difficulty urinating and dysuria.  Musculoskeletal:       Knee doing better s/p surgery.  Shoulder pain as outlined.  Limited rom.   Skin: Negative for color change and rash.  Neurological: Negative for dizziness, light-headedness and headaches.  Psychiatric/Behavioral: Negative for agitation and dysphoric mood.       Objective:    Physical Exam  Constitutional: She appears well-developed and well-nourished. No distress.  HENT:  Nose: Nose normal.  Mouth/Throat: Oropharynx is clear and moist.  Neck: Neck supple. No thyromegaly present.  Cardiovascular: Normal rate and regular rhythm.  Pulmonary/Chest: Breath sounds normal. No respiratory distress. She has no wheezes.  Abdominal: Soft. Bowel sounds are normal. There is no tenderness.  Musculoskeletal: She exhibits no edema or tenderness.  Increased pain with abduction of arm especially at or above 90 degrees.  Pain with palpation over the shoulder.    Lymphadenopathy:    She has no cervical adenopathy.  Skin: No rash noted. No erythema.  Psychiatric: She has a normal mood and affect. Her behavior is normal.    BP 130/86 (BP Location: Left Arm, Patient Position:  Sitting, Cuff Size: Large)   Pulse 61   Temp 97.9 F (36.6 C) (Oral)   Resp 18   Wt 211 lb 9.6 oz (96 kg)   SpO2 96%   BMI 39.98 kg/m  Wt Readings from Last 3 Encounters:  01/31/18 211 lb 9.6 oz (96 kg)  07/28/17 231 lb (104.8 kg)  07/28/17 213 lb (96.6 kg)     Lab Results  Component Value Date   WBC 6.0  01/31/2018   HGB 13.5 01/31/2018   HCT 40.8 01/31/2018   PLT 212.0 01/31/2018   GLUCOSE 93 01/31/2018   CHOL 211 (H) 01/31/2018   TRIG 92.0 01/31/2018   HDL 82.80 01/31/2018   LDLDIRECT 131.0 03/19/2013   LDLCALC 110 (H) 01/31/2018   ALT 16 01/31/2018   AST 17 01/31/2018   NA 135 01/31/2018   K 4.1 01/31/2018   CL 95 (L) 01/31/2018   CREATININE 0.87 01/31/2018   BUN 11 01/31/2018   CO2 33 (H) 01/31/2018   TSH 2.93 01/31/2018   INR 0.99 04/05/2017       Assessment & Plan:   Problem List Items Addressed This Visit    Anemia    Follow cbc.        GERD (gastroesophageal reflux disease)    Controlled on current regimen.  Follow.        Hypercholesterolemia    On lovastatin.  Low cholesterol diet and exercise.  Follow lipid panel and liver function tests.        Relevant Orders   Hepatic function panel (Completed)   Lipid panel (Completed)   Hypertension    Blood pressure under good control.  Continue same medication regimen.  Follow pressures.  Follow metabolic panel.        Relevant Orders   CBC with Differential/Platelet (Completed)   Basic metabolic panel (Completed)   Hypomagnesemia    Taking magnesium q day now.  Recheck magnesium level.        Relevant Orders   Magnesium (Completed)   Hypothyroidism    On thyroid replacement.  Follow tsh.        Relevant Orders   TSH (Completed)   Right shoulder pain    Pain in right shoulder.  Limited rom.  Refer to ortho for evaluation and treatment.        Relevant Orders   Ambulatory referral to Orthopedic Surgery   S/P total knee arthroplasty    Seeing ortho.         Other Visit Diagnoses     Breast cancer screening    -  Primary   Relevant Orders   MM 3D SCREEN BREAST BILATERAL   Encounter for immunization       Relevant Orders   Flu vaccine HIGH DOSE PF (Completed)       Einar Pheasant, MD

## 2018-02-03 ENCOUNTER — Encounter: Payer: Self-pay | Admitting: Internal Medicine

## 2018-02-03 NOTE — Assessment & Plan Note (Signed)
Blood pressure under good control.  Continue same medication regimen.  Follow pressures.  Follow metabolic panel.   

## 2018-02-03 NOTE — Assessment & Plan Note (Signed)
Seeing ortho.   

## 2018-02-03 NOTE — Assessment & Plan Note (Signed)
Pain in right shoulder.  Limited rom.  Refer to ortho for evaluation and treatment.

## 2018-02-03 NOTE — Assessment & Plan Note (Signed)
Taking magnesium q day now.  Recheck magnesium level.

## 2018-02-03 NOTE — Assessment & Plan Note (Signed)
On thyroid replacement.  Follow tsh.  

## 2018-02-03 NOTE — Assessment & Plan Note (Signed)
On lovastatin.  Low cholesterol diet and exercise.  Follow lipid panel and liver function tests.   

## 2018-02-03 NOTE — Assessment & Plan Note (Signed)
Controlled on current regimen.  Follow.  

## 2018-02-03 NOTE — Assessment & Plan Note (Signed)
Follow cbc.  

## 2018-02-20 DIAGNOSIS — M25511 Pain in right shoulder: Secondary | ICD-10-CM | POA: Diagnosis not present

## 2018-02-20 DIAGNOSIS — M7541 Impingement syndrome of right shoulder: Secondary | ICD-10-CM | POA: Diagnosis not present

## 2018-02-22 ENCOUNTER — Encounter: Payer: Self-pay | Admitting: *Deleted

## 2018-02-23 ENCOUNTER — Encounter: Payer: Self-pay | Admitting: Anesthesiology

## 2018-02-23 ENCOUNTER — Ambulatory Visit: Payer: Medicare HMO | Admitting: Anesthesiology

## 2018-02-23 ENCOUNTER — Ambulatory Visit
Admission: RE | Admit: 2018-02-23 | Discharge: 2018-02-23 | Disposition: A | Discharge status: Home / Self Care | Payer: Medicare HMO | Source: Ambulatory Visit | Attending: Unknown Physician Specialty | Admitting: Unknown Physician Specialty

## 2018-02-23 ENCOUNTER — Encounter
Admission: RE | Disposition: A | Discharge status: Home / Self Care | Payer: Self-pay | Source: Ambulatory Visit | Attending: Unknown Physician Specialty

## 2018-02-23 DIAGNOSIS — E039 Hypothyroidism, unspecified: Secondary | ICD-10-CM | POA: Insufficient documentation

## 2018-02-23 DIAGNOSIS — K640 First degree hemorrhoids: Secondary | ICD-10-CM | POA: Insufficient documentation

## 2018-02-23 DIAGNOSIS — E785 Hyperlipidemia, unspecified: Secondary | ICD-10-CM | POA: Diagnosis not present

## 2018-02-23 DIAGNOSIS — E7800 Pure hypercholesterolemia, unspecified: Secondary | ICD-10-CM | POA: Insufficient documentation

## 2018-02-23 DIAGNOSIS — Z7982 Long term (current) use of aspirin: Secondary | ICD-10-CM | POA: Insufficient documentation

## 2018-02-23 DIAGNOSIS — Z79890 Hormone replacement therapy (postmenopausal): Secondary | ICD-10-CM | POA: Insufficient documentation

## 2018-02-23 DIAGNOSIS — Z86010 Personal history of colonic polyps: Secondary | ICD-10-CM | POA: Insufficient documentation

## 2018-02-23 DIAGNOSIS — Z6836 Body mass index (BMI) 36.0-36.9, adult: Secondary | ICD-10-CM | POA: Insufficient documentation

## 2018-02-23 DIAGNOSIS — Z1211 Encounter for screening for malignant neoplasm of colon: Secondary | ICD-10-CM | POA: Insufficient documentation

## 2018-02-23 DIAGNOSIS — I10 Essential (primary) hypertension: Secondary | ICD-10-CM | POA: Diagnosis not present

## 2018-02-23 DIAGNOSIS — K219 Gastro-esophageal reflux disease without esophagitis: Secondary | ICD-10-CM | POA: Diagnosis not present

## 2018-02-23 DIAGNOSIS — Z79899 Other long term (current) drug therapy: Secondary | ICD-10-CM | POA: Insufficient documentation

## 2018-02-23 HISTORY — PX: COLONOSCOPY WITH PROPOFOL: SHX5780

## 2018-02-23 LAB — HM COLONOSCOPY

## 2018-02-23 SURGERY — COLONOSCOPY WITH PROPOFOL
Anesthesia: General

## 2018-02-23 MED ORDER — FENTANYL CITRATE (PF) 100 MCG/2ML IJ SOLN
INTRAMUSCULAR | Status: AC
  Filled 2018-02-23: qty 2

## 2018-02-23 MED ORDER — PROPOFOL 500 MG/50ML IV EMUL
INTRAVENOUS | Status: AC
  Filled 2018-02-23: qty 50

## 2018-02-23 MED ORDER — LIDOCAINE HCL (PF) 2 % IJ SOLN
INTRAMUSCULAR | Status: AC
  Filled 2018-02-23: qty 10

## 2018-02-23 MED ORDER — MIDAZOLAM HCL 2 MG/2ML IJ SOLN
INTRAMUSCULAR | Status: AC
  Filled 2018-02-23: qty 2

## 2018-02-23 MED ORDER — LIDOCAINE HCL (PF) 2 % IJ SOLN
INTRAMUSCULAR | Status: DC | PRN
  Administered 2018-02-23: 100 mg

## 2018-02-23 MED ORDER — SODIUM CHLORIDE 0.9 % IV SOLN
INTRAVENOUS | Status: DC
  Administered 2018-02-23: 11:00:00 via INTRAVENOUS

## 2018-02-23 MED ORDER — PROPOFOL 10 MG/ML IV BOLUS
INTRAVENOUS | Status: DC | PRN
  Administered 2018-02-23: 10 mg via INTRAVENOUS
  Administered 2018-02-23: 30 mg via INTRAVENOUS

## 2018-02-23 MED ORDER — PIPERACILLIN-TAZOBACTAM 3.375 G IVPB
INTRAVENOUS | Status: AC
  Filled 2018-02-23: qty 50

## 2018-02-23 MED ORDER — MIDAZOLAM HCL 5 MG/5ML IJ SOLN
INTRAMUSCULAR | Status: DC | PRN
  Administered 2018-02-23: 2 mg via INTRAVENOUS

## 2018-02-23 MED ORDER — FENTANYL CITRATE (PF) 100 MCG/2ML IJ SOLN
INTRAMUSCULAR | Status: DC | PRN
  Administered 2018-02-23: 50 ug via INTRAVENOUS

## 2018-02-23 MED ORDER — PIPERACILLIN-TAZOBACTAM 3.375 G IVPB 30 MIN
3.3750 g | Freq: Once | INTRAVENOUS | Status: AC
  Administered 2018-02-23: 3.375 g via INTRAVENOUS
  Filled 2018-02-23: qty 50

## 2018-02-23 MED ORDER — PROPOFOL 500 MG/50ML IV EMUL
INTRAVENOUS | Status: DC | PRN
  Administered 2018-02-23: 50 ug/kg/min via INTRAVENOUS

## 2018-02-23 NOTE — Op Note (Signed)
[facility] Patient Name: Amanda Browning Procedure Date: 02/23/2018 11:07 AM MRN: [id number] Account #: [id number] Date of Birth: 21-Nov-1945 Admit Type: Outpatient Age: 72 Room: [facility] ENDO ROOM 1 Gender: Female Note Status: Finalized Procedure:            Colonoscopy Indications:          High risk colon cancer surveillance: Personal history                        of colonic polyps Providers:            Manya Silvas, MD Referring MD:         Einar Pheasant, MD (Referring MD) Medicines:            Propofol per Anesthesia Complications:        No immediate complications. Procedure:            Pre-Anesthesia Assessment:                       - After reviewing the risks and benefits, the patient                        was deemed in satisfactory condition to undergo the                        procedure.                       After obtaining informed consent, the colonoscope was                        passed under direct vision. Throughout the procedure,                        the patient's blood pressure, pulse, and oxygen                        saturations were monitored continuously. The                        Colonoscope was introduced through the anus and                        advanced to the the cecum, identified by appendiceal                        orifice and ileocecal valve. The colonoscopy was                        somewhat difficult due to significant looping.                        Successful completion of the procedure was aided by                        applying abdominal pressure. The patient tolerated the                        procedure well. Findings:      Pictures of small bowel and colon were inadvertantly not done.      Internal hemorrhoids were found during  endoscopy. The hemorrhoids were       small and Grade I (internal hemorrhoids that do not prolapse).      The terminal ileum appeared normal.      The colon (entire  examined portion) appeared normal. No polyps or       abnormal mucosa. Impression:           - Internal hemorrhoids.                       - The examined portion of the ileum was normal.                       - The entire examined colon is normal.                       - No specimens collected. Recommendation:       - Repeat colonoscopy in 5 years for surveillance. Manya Silvas, MD 02/23/2018 11:44:01 AM This report has been signed electronically. Number of Addenda: 0 Note Initiated On: 02/23/2018 11:07 AM Scope Withdrawal Time: 0 hours 9 minutes 49 seconds  Total Procedure Duration: 0 hours 19 minutes 34 seconds       [facility]

## 2018-02-23 NOTE — Transfer of Care (Signed)
Immediate Anesthesia Transfer of Care Note  Patient: Amanda Browning  Procedure(s) Performed: COLONOSCOPY WITH PROPOFOL (N/A )  Patient Location: PACU  Anesthesia Type:General  Level of Consciousness: sedated  Airway & Oxygen Therapy: Patient Spontanous Breathing  Post-op Assessment: Report given to RN and Post -op Vital signs reviewed and stable  Post vital signs: Reviewed and stable  Last Vitals:  Vitals Value Taken Time  BP 114/62 02/23/2018 11:40 AM  Temp 36.3 C 02/23/2018 11:40 AM  Pulse 65 02/23/2018 11:44 AM  Resp 17 02/23/2018 11:44 AM  SpO2 96 % 02/23/2018 11:44 AM  Vitals shown include unvalidated device data.  Last Pain:  Vitals:   02/23/18 1140  TempSrc: Tympanic         Complications: No apparent anesthesia complications

## 2018-02-23 NOTE — Anesthesia Post-op Follow-up Note (Signed)
Anesthesia QCDR form completed.        

## 2018-02-23 NOTE — Anesthesia Preprocedure Evaluation (Signed)
Anesthesia Evaluation  Patient identified by MRN, date of birth, ID band Patient awake    Reviewed: Allergy & Precautions, NPO status , Patient's Chart, lab work & pertinent test results, reviewed documented beta blocker date and time   History of Anesthesia Complications (+) PONV and history of anesthetic complications  Airway Mallampati: III  TM Distance: >3 FB     Dental  (+) Chipped   Pulmonary           Cardiovascular hypertension,      Neuro/Psych  Headaches,    GI/Hepatic GERD  ,  Endo/Other  Hypothyroidism Morbid obesity  Renal/GU      Musculoskeletal  (+) Arthritis ,   Abdominal   Peds  Hematology  (+) anemia ,   Anesthesia Other Findings   Reproductive/Obstetrics                             Anesthesia Physical Anesthesia Plan  ASA: III  Anesthesia Plan: General   Post-op Pain Management:    Induction: Intravenous  PONV Risk Score and Plan:   Airway Management Planned:   Additional Equipment:   Intra-op Plan:   Post-operative Plan:   Informed Consent: I have reviewed the patients History and Physical, chart, labs and discussed the procedure including the risks, benefits and alternatives for the proposed anesthesia with the patient or authorized representative who has indicated his/her understanding and acceptance.     Plan Discussed with: CRNA  Anesthesia Plan Comments:         Anesthesia Quick Evaluation

## 2018-02-23 NOTE — Anesthesia Postprocedure Evaluation (Signed)
Anesthesia Post Note  Patient: Amanda Browning  Procedure(s) Performed: COLONOSCOPY WITH PROPOFOL (N/A )  Patient location during evaluation: Endoscopy Anesthesia Type: General Level of consciousness: awake and alert Pain management: pain level controlled Vital Signs Assessment: post-procedure vital signs reviewed and stable Respiratory status: spontaneous breathing, nonlabored ventilation, respiratory function stable and patient connected to nasal cannula oxygen Cardiovascular status: blood pressure returned to baseline and stable Postop Assessment: no apparent nausea or vomiting Anesthetic complications: no     Last Vitals:  Vitals:   02/23/18 1150 02/23/18 1200  BP: 138/74 138/74  Pulse: (!) 57 (!) 55  Resp: (!) 30 17  Temp:    SpO2: 97% 97%    Last Pain:  Vitals:   02/23/18 1200  TempSrc:   PainSc: 0-No pain                 Karinna Beadles S

## 2018-02-26 ENCOUNTER — Encounter: Payer: Self-pay | Admitting: Unknown Physician Specialty

## 2018-03-01 DIAGNOSIS — H02834 Dermatochalasis of left upper eyelid: Secondary | ICD-10-CM | POA: Diagnosis not present

## 2018-03-01 DIAGNOSIS — H02831 Dermatochalasis of right upper eyelid: Secondary | ICD-10-CM | POA: Diagnosis not present

## 2018-03-21 NOTE — H&P (Signed)
Primary Care Physician:  Einar Pheasant, MD Primary Gastroenterologist:  Dr. Vira Agar  Pre-Procedure History & Physical: HPI:  Amanda Browning is a 72 y.o. female is here for an colonoscopy.For personal history of colon polyps.   Past Medical History:  Diagnosis Date  . Anemia   . Chicken pox   . Colon polyps   . Complication of anesthesia    doesn't take much anesthesia to put pt to sleep, slow to wake up  . DDD (degenerative disc disease), cervical 02/05/13   C5-C6 and C6-C7  . GERD (gastroesophageal reflux disease)   . History of chicken pox   . Hypercholesterolemia   . Hyperlipidemia   . Hypertension   . Hypothyroidism   . Migraines   . PONV (postoperative nausea and vomiting)   . Squamous cell skin cancer     Past Surgical History:  Procedure Laterality Date  . BREAST BIOPSY Right 2012   stereotactic  . COLONOSCOPY WITH PROPOFOL N/A 02/23/2018   Procedure: COLONOSCOPY WITH PROPOFOL;  Surgeon: Manya Silvas, MD;  Location: [facility] ENDOSCOPY;  Service: Endoscopy;  Laterality: N/A;  . KNEE ARTHROPLASTY Right 04/17/2017   Procedure: COMPUTER ASSISTED TOTAL KNEE ARTHROPLASTY;  Surgeon: Dereck Leep, MD;  Location: [facility] ORS;  Service: Orthopedics;  Laterality: Right;  . SKIN BIOPSY    . VAGINAL HYSTERECTOMY  1994   ovaries not removed  . WISDOM TOOTH EXTRACTION      Prior to Admission medications   Medication Sig Start Date End Date Taking? Authorizing Provider  aspirin EC 81 MG tablet Take by mouth.   Yes [provider]  levothyroxine (SYNTHROID, LEVOTHROID) 25 MCG tablet TAKE 1 TABLET EVERY DAY BEFORE BREAKFAST 12/12/17  Yes Einar Pheasant, MD  lisinopril-hydrochlorothiazide (PRINZIDE,ZESTORETIC) 20-25 MG tablet TAKE 1 TABLET EVERY DAY 11/07/17  Yes Einar Pheasant, MD  magnesium oxide (MAG-OX) 400 MG tablet Take 1 tablet (400 mg total) by mouth 2 (two) times daily. 01/24/17  Yes Einar Pheasant, MD  omeprazole (PRILOSEC) 20 MG capsule TAKE 1 CAPSULE  TWICE DAILY BEFORE A MEAL 12/12/17  Yes Einar Pheasant, MD  acetaminophen (TYLENOL) 325 MG tablet Take 650 mg by mouth 4 (four) times daily as needed.    [provider]  cetirizine (ZYRTEC) 10 MG tablet Take 10 mg by mouth daily.    [provider]  glucosamine-chondroitin 500-400 MG tablet Take 1 tablet by mouth daily.    [provider]  lovastatin (MEVACOR) 40 MG tablet TAKE 1 TABLET AT BEDTIME 06/27/17   Einar Pheasant, MD  Multiple Vitamin (MULTIVITAMIN) tablet Take 1 tablet by mouth daily.    [provider]  vitamin B-12 (CYANOCOBALAMIN) 1000 MCG tablet Take 1 tablet by mouth daily.    [provider]    Allergies as of 01/12/2018 - Review Complete 07/30/2017  Allergen Reaction Noted  . Codeine Other (See Comments) 03/19/2012    Family History  Problem Relation Age of Onset  . Heart disease Mother   . Stroke Mother   . Hypertension Mother   . Heart attack Mother   . Heart disease Father        enlarged heart  . Stroke Father   . Hypertension Father   . Hyperlipidemia Father   . Heart attack Father   . Ovarian cancer Paternal Aunt   . Breast cancer Sister 70  . Cancer Sister        Breast Cancer  . Diabetes type II Brother   . Hypertension Brother   .  Colon cancer Neg Hx   . Basal cell carcinoma Neg Hx   . Melanoma Neg Hx   . Squamous cell carcinoma Neg Hx     Social History   Socioeconomic History  . Marital status: Divorced    Spouse name: Not on file  . Number of children: 2  . Years of education: 20  . Highest education level: High school graduate  Occupational History  . Not on file  Social Needs  . Financial resource strain: Not hard at all  . Food insecurity:    Worry: Never true    Inability: Never true  . Transportation needs:    Medical: No    Non-medical: No  Tobacco Use  . Smoking status: Never Smoker  . Smokeless tobacco: Never Used  Substance and Sexual Activity  . Alcohol use: No     Alcohol/week: 0.0 standard drinks  . Drug use: No  . Sexual activity: Never  Lifestyle  . Physical activity:    Days per week: 4 days    Minutes per session: 30 min  . Stress: Not at all  Relationships  . Social connections:    Talks on phone: Not on file    Gets together: Not on file    Attends religious service: Not on file    Active member of club or organization: Not on file    Attends meetings of clubs or organizations: Not on file    Relationship status: Not on file  . Intimate partner violence:    Fear of current or ex partner: Not on file    Emotionally abused: Not on file    Physically abused: Not on file    Forced sexual activity: Not on file  Other Topics Concern  . Not on file  Social History Narrative   Full Code   Alcohol - none   Never smoker and no smokeless tobacco   2 daughters    Review of Systems: See HPI, otherwise negative ROS  Physical Exam: BP 138/74   Pulse (!) 55   Temp (!) 97.3 F (36.3 C) (Tympanic)   Resp 17   Ht 5\' 3"  (1.6 m)   Wt 93.9 kg   SpO2 97%   BMI 36.67 kg/m  General:   Alert,  pleasant and cooperative in NAD Head:  Normocephalic and atraumatic. Neck:  Supple; no masses or thyromegaly. Lungs:  Clear throughout to auscultation.    Heart:  Regular rate and rhythm. Abdomen:  Soft, nontender and nondistended. Normal bowel sounds, without guarding, and without rebound.   Neurologic:  Alert and  oriented x4;  grossly normal neurologically.  Impression/Plan: Amanda Browning is here for an colonoscopy to be performed for [facility] colon polyps. PH colon polyps so this is a follow up.  Risks, benefits, limitations, and alternatives regarding  colonoscopy have been reviewed with the patient.  Questions have been answered.  All parties agreeable.   Gaylyn Cheers, MD  03/21/2018, 2:52 PM

## 2018-03-21 NOTE — H&P (Signed)
Primary Care Physician:  Einar Pheasant, MD Primary Gastroenterologist:  Dr. Vira Agar  Pre-Procedure History & Physical: HPI:  Amanda Browning is a 72 y.o. female is here for an colonoscopy.   Past Medical History:  Diagnosis Date  . Anemia   . Chicken pox   . Colon polyps   . Complication of anesthesia    doesn't take much anesthesia to put pt to sleep, slow to wake up  . DDD (degenerative disc disease), cervical 02/05/13   C5-C6 and C6-C7  . GERD (gastroesophageal reflux disease)   . History of chicken pox   . Hypercholesterolemia   . Hyperlipidemia   . Hypertension   . Hypothyroidism   . Migraines   . PONV (postoperative nausea and vomiting)   . Squamous cell skin cancer     Past Surgical History:  Procedure Laterality Date  . BREAST BIOPSY Right 2012   stereotactic  . COLONOSCOPY WITH PROPOFOL N/A 02/23/2018   Procedure: COLONOSCOPY WITH PROPOFOL;  Surgeon: Manya Silvas, MD;  Location: [facility] ENDOSCOPY;  Service: Endoscopy;  Laterality: N/A;  . KNEE ARTHROPLASTY Right 04/17/2017   Procedure: COMPUTER ASSISTED TOTAL KNEE ARTHROPLASTY;  Surgeon: Dereck Leep, MD;  Location: [facility] ORS;  Service: Orthopedics;  Laterality: Right;  . SKIN BIOPSY    . VAGINAL HYSTERECTOMY  1994   ovaries not removed  . WISDOM TOOTH EXTRACTION      Prior to Admission medications   Medication Sig Start Date End Date Taking? Authorizing Provider  aspirin EC 81 MG tablet Take by mouth.   Yes [provider]  levothyroxine (SYNTHROID, LEVOTHROID) 25 MCG tablet TAKE 1 TABLET EVERY DAY BEFORE BREAKFAST 12/12/17  Yes Einar Pheasant, MD  lisinopril-hydrochlorothiazide (PRINZIDE,ZESTORETIC) 20-25 MG tablet TAKE 1 TABLET EVERY DAY 11/07/17  Yes Einar Pheasant, MD  magnesium oxide (MAG-OX) 400 MG tablet Take 1 tablet (400 mg total) by mouth 2 (two) times daily. 01/24/17  Yes Einar Pheasant, MD  omeprazole (PRILOSEC) 20 MG capsule TAKE 1 CAPSULE TWICE DAILY BEFORE A MEAL 12/12/17  Yes  Einar Pheasant, MD  acetaminophen (TYLENOL) 325 MG tablet Take 650 mg by mouth 4 (four) times daily as needed.    [provider]  cetirizine (ZYRTEC) 10 MG tablet Take 10 mg by mouth daily.    [provider]  glucosamine-chondroitin 500-400 MG tablet Take 1 tablet by mouth daily.    [provider]  lovastatin (MEVACOR) 40 MG tablet TAKE 1 TABLET AT BEDTIME 06/27/17   Einar Pheasant, MD  Multiple Vitamin (MULTIVITAMIN) tablet Take 1 tablet by mouth daily.    [provider]  vitamin B-12 (CYANOCOBALAMIN) 1000 MCG tablet Take 1 tablet by mouth daily.    [provider]    Allergies as of 01/12/2018 - Review Complete 07/30/2017  Allergen Reaction Noted  . Codeine Other (See Comments) 03/19/2012    Family History  Problem Relation Age of Onset  . Heart disease Mother   . Stroke Mother   . Hypertension Mother   . Heart attack Mother   . Heart disease Father        enlarged heart  . Stroke Father   . Hypertension Father   . Hyperlipidemia Father   . Heart attack Father   . Ovarian cancer Paternal Aunt   . Breast cancer Sister 73  . Cancer Sister        Breast Cancer  . Diabetes type II Brother   . Hypertension Brother   . Colon cancer Neg  Hx   . Basal cell carcinoma Neg Hx   . Melanoma Neg Hx   . Squamous cell carcinoma Neg Hx     Social History   Socioeconomic History  . Marital status: Divorced    Spouse name: Not on file  . Number of children: 2  . Years of education: 36  . Highest education level: High school graduate  Occupational History  . Not on file  Social Needs  . Financial resource strain: Not hard at all  . Food insecurity:    Worry: Never true    Inability: Never true  . Transportation needs:    Medical: No    Non-medical: No  Tobacco Use  . Smoking status: Never Smoker  . Smokeless tobacco: Never Used  Substance and Sexual Activity  . Alcohol use: No    Alcohol/week: 0.0 standard drinks  . Drug  use: No  . Sexual activity: Never  Lifestyle  . Physical activity:    Days per week: 4 days    Minutes per session: 30 min  . Stress: Not at all  Relationships  . Social connections:    Talks on phone: Not on file    Gets together: Not on file    Attends religious service: Not on file    Active member of club or organization: Not on file    Attends meetings of clubs or organizations: Not on file    Relationship status: Not on file  . Intimate partner violence:    Fear of current or ex partner: Not on file    Emotionally abused: Not on file    Physically abused: Not on file    Forced sexual activity: Not on file  Other Topics Concern  . Not on file  Social History Narrative   Full Code   Alcohol - none   Never smoker and no smokeless tobacco   2 daughters    Review of Systems: See HPI, otherwise negative ROS  Physical Exam: BP 138/74   Pulse (!) 55   Temp (!) 97.3 F (36.3 C) (Tympanic)   Resp 17   Ht 5\' 3"  (1.6 m)   Wt 93.9 kg   SpO2 97%   BMI 36.67 kg/m  General:   Alert,  pleasant and cooperative in NAD Head:  Normocephalic and atraumatic. Neck:  Supple; no masses or thyromegaly. Lungs:  Clear throughout to auscultation.    Heart:  Regular rate and rhythm. Abdomen:  Soft, nontender and nondistended. Normal bowel sounds, without guarding, and without rebound.   Neurologic:  Alert and  oriented x4;  grossly normal neurologically.  Impression/Plan: Amanda Browning is here for an colonoscopy to be performed for [facility] colon polyps.  Risks, benefits, limitations, and alternatives regarding  gastroscopy have been reviewed with the patient.  Questions have been answered.  All parties agreeable.   Gaylyn Cheers, MD  03/21/2018, 2:58 PM

## 2018-03-23 ENCOUNTER — Ambulatory Visit
Admission: RE | Admit: 2018-03-23 | Discharge: 2018-03-23 | Disposition: A | Discharge status: Home / Self Care | Payer: Medicare HMO | Source: Ambulatory Visit | Attending: Internal Medicine | Admitting: Internal Medicine

## 2018-03-23 DIAGNOSIS — Z1239 Encounter for other screening for malignant neoplasm of breast: Secondary | ICD-10-CM

## 2018-03-23 DIAGNOSIS — Z1231 Encounter for screening mammogram for malignant neoplasm of breast: Secondary | ICD-10-CM | POA: Insufficient documentation

## 2018-03-26 ENCOUNTER — Other Ambulatory Visit: Payer: Self-pay | Admitting: Internal Medicine

## 2018-03-29 DIAGNOSIS — H02834 Dermatochalasis of left upper eyelid: Secondary | ICD-10-CM | POA: Diagnosis not present

## 2018-03-29 DIAGNOSIS — H02831 Dermatochalasis of right upper eyelid: Secondary | ICD-10-CM | POA: Diagnosis not present

## 2018-04-16 ENCOUNTER — Other Ambulatory Visit: Payer: Self-pay | Admitting: Internal Medicine

## 2018-04-17 DIAGNOSIS — M1711 Unilateral primary osteoarthritis, right knee: Secondary | ICD-10-CM | POA: Diagnosis not present

## 2018-04-17 DIAGNOSIS — Z96651 Presence of right artificial knee joint: Secondary | ICD-10-CM | POA: Diagnosis not present

## 2018-05-02 ENCOUNTER — Other Ambulatory Visit: Payer: Self-pay | Admitting: Internal Medicine

## 2018-05-30 ENCOUNTER — Other Ambulatory Visit: Payer: Self-pay | Admitting: Internal Medicine

## 2018-07-25 ENCOUNTER — Telehealth: Payer: Self-pay

## 2018-07-25 NOTE — Telephone Encounter (Signed)
Copied from [place] [id number]. Topic: Appointment Scheduling - Scheduling Inquiry for Clinic >> Jul 25, 2018 10:49 AM Gustavus Messing wrote: Reason for CRM: Patient wants to reschedule her appointment on 08/03/18 until an order is lifted for Covid-19

## 2018-07-31 ENCOUNTER — Ambulatory Visit: Payer: Medicare HMO | Admitting: Internal Medicine

## 2018-07-31 ENCOUNTER — Ambulatory Visit: Payer: Medicare HMO

## 2018-08-03 ENCOUNTER — Other Ambulatory Visit: Payer: Self-pay | Admitting: Internal Medicine

## 2018-08-03 ENCOUNTER — Encounter: Payer: Medicare HMO | Admitting: Internal Medicine

## 2018-09-04 DIAGNOSIS — H5203 Hypermetropia, bilateral: Secondary | ICD-10-CM | POA: Diagnosis not present

## 2018-09-12 ENCOUNTER — Other Ambulatory Visit: Payer: Self-pay | Admitting: Internal Medicine

## 2018-09-20 ENCOUNTER — Other Ambulatory Visit: Payer: Self-pay | Admitting: Internal Medicine

## 2018-09-26 ENCOUNTER — Other Ambulatory Visit: Payer: Self-pay | Admitting: Internal Medicine

## 2018-11-09 ENCOUNTER — Ambulatory Visit (INDEPENDENT_AMBULATORY_CARE_PROVIDER_SITE_OTHER): Payer: Medicare HMO | Admitting: Internal Medicine

## 2018-11-09 ENCOUNTER — Other Ambulatory Visit: Payer: Self-pay

## 2018-11-09 ENCOUNTER — Encounter: Payer: Self-pay | Admitting: Internal Medicine

## 2018-11-09 VITALS — BP 126/78 | HR 72 | Temp 98.0°F | Resp 16 | Ht 61.0 in | Wt 214.0 lb

## 2018-11-09 DIAGNOSIS — Z0000 Encounter for general adult medical examination without abnormal findings: Secondary | ICD-10-CM

## 2018-11-09 DIAGNOSIS — E039 Hypothyroidism, unspecified: Secondary | ICD-10-CM | POA: Diagnosis not present

## 2018-11-09 DIAGNOSIS — R0602 Shortness of breath: Secondary | ICD-10-CM

## 2018-11-09 DIAGNOSIS — D649 Anemia, unspecified: Secondary | ICD-10-CM

## 2018-11-09 DIAGNOSIS — K219 Gastro-esophageal reflux disease without esophagitis: Secondary | ICD-10-CM

## 2018-11-09 DIAGNOSIS — I10 Essential (primary) hypertension: Secondary | ICD-10-CM

## 2018-11-09 DIAGNOSIS — E7800 Pure hypercholesterolemia, unspecified: Secondary | ICD-10-CM

## 2018-11-09 DIAGNOSIS — Z6841 Body Mass Index (BMI) 40.0 and over, adult: Secondary | ICD-10-CM | POA: Diagnosis not present

## 2018-11-09 LAB — HEPATIC FUNCTION PANEL
ALT: 16 U/L (ref 0–35)
AST: 17 U/L (ref 0–37)
Albumin: 4.2 g/dL (ref 3.5–5.2)
Alkaline Phosphatase: 51 U/L (ref 39–117)
Bilirubin, Direct: 0.1 mg/dL (ref 0.0–0.3)
Total Bilirubin: 0.6 mg/dL (ref 0.2–1.2)
Total Protein: 6.4 g/dL (ref 6.0–8.3)

## 2018-11-09 LAB — BASIC METABOLIC PANEL
BUN: 12 mg/dL (ref 6–23)
CO2: 31 mEq/L (ref 19–32)
Calcium: 9.6 mg/dL (ref 8.4–10.5)
Chloride: 96 mEq/L (ref 96–112)
Creatinine, Ser: 0.77 mg/dL (ref 0.40–1.20)
GFR: 73.51 mL/min (ref >60.00)
Glucose, Bld: 82 mg/dL (ref 70–99)
Potassium: 4.1 mEq/L (ref 3.5–5.1)
Sodium: 136 mEq/L (ref 135–145)

## 2018-11-09 LAB — LIPID PANEL
Cholesterol: 183 mg/dL (ref 0–200)
HDL: 76.1 mg/dL (ref >39.00)
LDL Cholesterol: 88 mg/dL (ref 0–99)
NonHDL: 107.13
Total CHOL/HDL Ratio: 2
Triglycerides: 96 mg/dL (ref 0.0–149.0)
VLDL: 19.2 mg/dL (ref 0.0–40.0)

## 2018-11-09 LAB — TSH: TSH: 3.46 u[IU]/mL (ref 0.35–4.50)

## 2018-11-09 LAB — MAGNESIUM: Magnesium: 1.6 mg/dL (ref 1.5–2.5)

## 2018-11-09 NOTE — Progress Notes (Signed)
Subjective:    Patient ID: Amanda Browning, female    DOB: 1945/10/23, 73 y.o.   MRN: [id number]  HPI  Patient here for her physical exam.  She reports she is doing relatively well.  Is back at work.  No fever.  No chest congestion, cough.  No chest pain.  Does report when she over exerts her self, she notices having to stop and take a deep breath.  No sob with regular walking.  No chest pain with increased activity.  States she has noticed "this with her breathing" previously.  Resolved with getting back in her exercise routine.  No acid reflux.  No abdominal pain.  Bowels moving.  S/p TKR 2018.  Doing well.  S/p shoulder injection 02/2018.  Doing better.  Does report increased pain - base of her right thumb.  Has been taking tylenol.  Not helping now.  Discussed risk of antiinflammatories.     Past Medical History:  Diagnosis Date  . Anemia   . Chicken pox   . Colon polyps   . Complication of anesthesia    doesn't take much anesthesia to put pt to sleep, slow to wake up  . DDD (degenerative disc disease), cervical 02/05/13   C5-C6 and C6-C7  . GERD (gastroesophageal reflux disease)   . History of chicken pox   . Hypercholesterolemia   . Hyperlipidemia   . Hypertension   . Hypothyroidism   . Migraines   . PONV (postoperative nausea and vomiting)   . Squamous cell skin cancer    Past Surgical History:  Procedure Laterality Date  . BREAST BIOPSY Right 2012   stereotactic  . COLONOSCOPY WITH PROPOFOL N/A 02/23/2018   Procedure: COLONOSCOPY WITH PROPOFOL;  Surgeon: Manya Silvas, MD;  Location: [facility] ENDOSCOPY;  Service: Endoscopy;  Laterality: N/A;  . KNEE ARTHROPLASTY Right 04/17/2017   Procedure: COMPUTER ASSISTED TOTAL KNEE ARTHROPLASTY;  Surgeon: Dereck Leep, MD;  Location: [facility] ORS;  Service: Orthopedics;  Laterality: Right;  . SKIN BIOPSY    . VAGINAL HYSTERECTOMY  1994   ovaries not removed  . WISDOM TOOTH EXTRACTION     Family History  Problem Relation Age of  Onset  . Heart disease Mother   . Stroke Mother   . Hypertension Mother   . Heart attack Mother   . Heart disease Father        enlarged heart  . Stroke Father   . Hypertension Father   . Hyperlipidemia Father   . Heart attack Father   . Ovarian cancer Paternal Aunt   . Breast cancer Sister 67  . Cancer Sister        Breast Cancer  . Diabetes type II Brother   . Hypertension Brother   . Colon cancer Neg Hx   . Basal cell carcinoma Neg Hx   . Melanoma Neg Hx   . Squamous cell carcinoma Neg Hx    Social History   Socioeconomic History  . Marital status: Divorced    Spouse name: Not on file  . Number of children: 2  . Years of education: 22  . Highest education level: High school graduate  Occupational History  . Not on file  Social Needs  . Financial resource strain: Not hard at all  . Food insecurity    Worry: Never true    Inability: Never true  . Transportation needs    Medical: No    Non-medical: No  Tobacco Use  . Smoking status: Never  Smoker  . Smokeless tobacco: Never Used  Substance and Sexual Activity  . Alcohol use: No    Alcohol/week: 0.0 standard drinks  . Drug use: No  . Sexual activity: Never  Lifestyle  . Physical activity    Days per week: 4 days    Minutes per session: 30 min  . Stress: Not at all  Relationships  . Social Herbalist on phone: Not on file    Gets together: Not on file    Attends religious service: Not on file    Active member of club or organization: Not on file    Attends meetings of clubs or organizations: Not on file    Relationship status: Not on file  Other Topics Concern  . Not on file  Social History Narrative   Full Code   Alcohol - none   Never smoker and no smokeless tobacco   2 daughters    Outpatient Encounter Medications as of 11/09/2018  Medication Sig  . acetaminophen (TYLENOL) 325 MG tablet Take 650 mg by mouth 4 (four) times daily as needed.  Marland Kitchen aspirin EC 81 MG tablet Take by mouth.  .  cetirizine (ZYRTEC) 10 MG tablet Take 10 mg by mouth daily.  Marland Kitchen glucosamine-chondroitin 500-400 MG tablet Take 1 tablet by mouth daily.  Marland Kitchen levothyroxine (SYNTHROID) 25 MCG tablet TAKE 1 TABLET EVERY DAY BEFORE BREAKFAST  . lisinopril-hydrochlorothiazide (ZESTORETIC) 20-25 MG tablet TAKE 1 TABLET EVERY DAY  . lovastatin (MEVACOR) 40 MG tablet TAKE 1 TABLET AT BEDTIME  . magnesium oxide (MAG-OX) 400 MG tablet Take 1 tablet (400 mg total) by mouth 2 (two) times daily.  . Multiple Vitamin (MULTIVITAMIN) tablet Take 1 tablet by mouth daily.  Marland Kitchen omeprazole (PRILOSEC) 20 MG capsule TAKE 1 CAPSULE TWICE DAILY BEFORE A MEAL  . vitamin B-12 (CYANOCOBALAMIN) 1000 MCG tablet Take 1 tablet by mouth daily.   No facility-administered encounter medications on file as of 11/09/2018.     Review of Systems  Constitutional: Negative for appetite change and unexpected weight change.  HENT: Negative for congestion and sinus pressure.   Eyes: Negative for pain and visual disturbance.  Respiratory: Negative for cough and chest tightness.        Reports the need to take deep breath with overexertion.    Cardiovascular: Negative for chest pain, palpitations and leg swelling.  Gastrointestinal: Negative for abdominal pain, diarrhea, nausea and vomiting.  Genitourinary: Negative for difficulty urinating and dysuria.  Musculoskeletal: Negative for joint swelling and myalgias.  Skin: Negative for color change and rash.  Neurological: Negative for dizziness, light-headedness and headaches.  Hematological: Negative for adenopathy. Does not bruise/bleed easily.  Psychiatric/Behavioral: Negative for agitation and dysphoric mood.       Objective:    Physical Exam Constitutional:      General: She is not in acute distress.    Appearance: Normal appearance. She is well-developed.  HENT:     Right Ear: External ear normal.     Left Ear: External ear normal.  Eyes:     General: No scleral icterus.       Right eye: No  discharge.        Left eye: No discharge.     Conjunctiva/sclera: Conjunctivae normal.  Neck:     Musculoskeletal: Neck supple. No muscular tenderness.     Thyroid: No thyromegaly.  Cardiovascular:     Rate and Rhythm: Normal rate and regular rhythm.  Pulmonary:     Effort: No tachypnea,  accessory muscle usage or respiratory distress.     Breath sounds: Normal breath sounds. No decreased breath sounds or wheezing.  Chest:     Breasts:        Right: No inverted nipple, mass, nipple discharge or tenderness (no axillary adenopathy).        Left: No inverted nipple, mass, nipple discharge or tenderness (no axilarry adenopathy).  Abdominal:     General: Bowel sounds are normal.     Palpations: Abdomen is soft.     Tenderness: There is no abdominal tenderness.  Musculoskeletal:        General: No swelling or tenderness.     Comments: Increased pain to palpation - base of right thumb.  No increased erythema or warmth.    Lymphadenopathy:     Cervical: No cervical adenopathy.  Skin:    Findings: No erythema or rash.  Neurological:     Mental Status: She is alert and oriented to person, place, and time.  Psychiatric:        Mood and Affect: Mood normal.        Behavior: Behavior normal.     BP 126/78   Pulse 72   Temp 98 F (36.7 C) (Oral)   Resp 16   Ht 5\' 1"  (1.549 m)   Wt 214 lb (97.1 kg)   SpO2 97%   BMI 40.43 kg/m  Wt Readings from Last 3 Encounters:  11/09/18 214 lb (97.1 kg)  02/23/18 207 lb (93.9 kg)  01/31/18 211 lb 9.6 oz (96 kg)     Lab Results  Component Value Date   WBC 6.0 01/31/2018   HGB 13.5 01/31/2018   HCT 40.8 01/31/2018   PLT 212.0 01/31/2018   GLUCOSE 82 11/09/2018   CHOL 183 11/09/2018   TRIG 96.0 11/09/2018   HDL 76.10 11/09/2018   LDLDIRECT 131.0 03/19/2013   LDLCALC 88 11/09/2018   ALT 16 11/09/2018   AST 17 11/09/2018   NA 136 11/09/2018   K 4.1 11/09/2018   CL 96 11/09/2018   CREATININE 0.77 11/09/2018   BUN 12 11/09/2018   CO2  31 11/09/2018   TSH 3.46 11/09/2018   INR 0.99 04/05/2017    Mm 3d Screen Breast Bilateral  Result Date: 03/23/2018 CLINICAL DATA:  Screening. EXAM: DIGITAL SCREENING BILATERAL MAMMOGRAM WITH TOMO AND CAD COMPARISON:  Previous exam(s). ACR Breast Density Category b: There are scattered areas of fibroglandular density. FINDINGS: There are no findings suspicious for malignancy. Images were processed with CAD. IMPRESSION: No mammographic evidence of malignancy. A result letter of this screening mammogram will be mailed directly to the patient. RECOMMENDATION: Screening mammogram in one year. (Code:SM-B-01Y) BI-RADS CATEGORY  1: Negative. Electronically Signed   By: Abelardo Diesel M.D.   On: 03/23/2018 11:01       Assessment & Plan:   Problem List Items Addressed This Visit    Anemia    Follow cbc.       GERD (gastroesophageal reflux disease)    Controlled on current regimen.  Follow.       Health care maintenance    Physical today 11/09/18.  Mammogram 03/23/18 - Briads I.  Colonoscopy 02/23/18 - internal hemorrhoids.  Otherwise normal.  Recommend f/u colonoscopy in 5 years.        Hypercholesterolemia    On lovastatin.  Low cholesterol diet and exercise.  Follow lipid panel and liver function tests.        Relevant Orders   Lipid panel (Completed)  Hepatic function panel (Completed)   Hypertension    Blood pressure as outlined.  Follow pressures.  Follow metabolic panel.       Relevant Orders   Basic metabolic panel (Completed)   Hypomagnesemia    Taking magnesium.  Recheck magnesium level.        Relevant Orders   Magnesium (Completed)   Hypothyroidism    On thyroid replacement.  Follow tsh.       Relevant Orders   TSH (Completed)   SOB (shortness of breath) on exertion    Saw cardiology several years ago.  Had stress test.  Given symptoms, EKG today - SR with no acute ischemic changes.  Pt notified.  Wants to monitor symptoms.  Will notify me if change or worsening  problems.         Other Visit Diagnoses    Shortness of breath    -  Primary   Relevant Orders   EKG 12-Lead (Completed)       Einar Pheasant, MD

## 2018-11-09 NOTE — Assessment & Plan Note (Signed)
Physical today 11/09/18.  Mammogram 03/23/18 - Briads I.  Colonoscopy 02/23/18 - internal hemorrhoids.  Otherwise normal.  Recommend f/u colonoscopy in 5 years.

## 2018-11-10 ENCOUNTER — Encounter: Payer: Self-pay | Admitting: Internal Medicine

## 2018-11-10 NOTE — Assessment & Plan Note (Signed)
On lovastatin.  Low cholesterol diet and exercise.  Follow lipid panel and liver function tests.   

## 2018-11-10 NOTE — Assessment & Plan Note (Signed)
Taking magnesium.  Recheck magnesium level.

## 2018-11-10 NOTE — Assessment & Plan Note (Signed)
Saw cardiology several years ago.  Had stress test.  Given symptoms, EKG today - SR with no acute ischemic changes.  Pt notified.  Wants to monitor symptoms.  Will notify me if change or worsening problems.

## 2018-11-10 NOTE — Assessment & Plan Note (Signed)
Follow cbc.  

## 2018-11-10 NOTE — Assessment & Plan Note (Signed)
On thyroid replacement.  Follow tsh.  

## 2018-11-10 NOTE — Assessment & Plan Note (Signed)
Blood pressure as outlined.  Follow pressures.  Follow metabolic panel.   

## 2018-11-10 NOTE — Assessment & Plan Note (Signed)
Controlled on current regimen.  Follow.  

## 2018-11-30 DIAGNOSIS — M71342 Other bursal cyst, left hand: Secondary | ICD-10-CM | POA: Diagnosis not present

## 2018-11-30 DIAGNOSIS — X32XXXA Exposure to sunlight, initial encounter: Secondary | ICD-10-CM | POA: Diagnosis not present

## 2018-11-30 DIAGNOSIS — D2272 Melanocytic nevi of left lower limb, including hip: Secondary | ICD-10-CM | POA: Diagnosis not present

## 2018-11-30 DIAGNOSIS — D2262 Melanocytic nevi of left upper limb, including shoulder: Secondary | ICD-10-CM | POA: Diagnosis not present

## 2018-11-30 DIAGNOSIS — D225 Melanocytic nevi of trunk: Secondary | ICD-10-CM | POA: Diagnosis not present

## 2018-11-30 DIAGNOSIS — L738 Other specified follicular disorders: Secondary | ICD-10-CM | POA: Diagnosis not present

## 2018-11-30 DIAGNOSIS — D2271 Melanocytic nevi of right lower limb, including hip: Secondary | ICD-10-CM | POA: Diagnosis not present

## 2018-11-30 DIAGNOSIS — D2261 Melanocytic nevi of right upper limb, including shoulder: Secondary | ICD-10-CM | POA: Diagnosis not present

## 2018-11-30 DIAGNOSIS — L570 Actinic keratosis: Secondary | ICD-10-CM | POA: Diagnosis not present

## 2018-12-05 ENCOUNTER — Other Ambulatory Visit: Payer: Self-pay | Admitting: Internal Medicine

## 2019-02-06 ENCOUNTER — Other Ambulatory Visit: Payer: Self-pay | Admitting: Internal Medicine

## 2019-02-12 ENCOUNTER — Other Ambulatory Visit: Payer: Self-pay

## 2019-02-12 ENCOUNTER — Ambulatory Visit (INDEPENDENT_AMBULATORY_CARE_PROVIDER_SITE_OTHER): Payer: Medicare HMO | Admitting: Internal Medicine

## 2019-02-12 ENCOUNTER — Other Ambulatory Visit: Payer: Self-pay | Admitting: Internal Medicine

## 2019-02-12 VITALS — BP 126/72 | HR 85 | Temp 95.4°F | Resp 16 | Wt 211.8 lb

## 2019-02-12 DIAGNOSIS — E7800 Pure hypercholesterolemia, unspecified: Secondary | ICD-10-CM

## 2019-02-12 DIAGNOSIS — E039 Hypothyroidism, unspecified: Secondary | ICD-10-CM | POA: Diagnosis not present

## 2019-02-12 DIAGNOSIS — I10 Essential (primary) hypertension: Secondary | ICD-10-CM | POA: Diagnosis not present

## 2019-02-12 DIAGNOSIS — F439 Reaction to severe stress, unspecified: Secondary | ICD-10-CM | POA: Diagnosis not present

## 2019-02-12 DIAGNOSIS — Z23 Encounter for immunization: Secondary | ICD-10-CM | POA: Diagnosis not present

## 2019-02-12 DIAGNOSIS — R079 Chest pain, unspecified: Secondary | ICD-10-CM

## 2019-02-12 DIAGNOSIS — Z1231 Encounter for screening mammogram for malignant neoplasm of breast: Secondary | ICD-10-CM

## 2019-02-12 DIAGNOSIS — D649 Anemia, unspecified: Secondary | ICD-10-CM

## 2019-02-12 DIAGNOSIS — K219 Gastro-esophageal reflux disease without esophagitis: Secondary | ICD-10-CM | POA: Diagnosis not present

## 2019-02-12 LAB — CBC WITH DIFFERENTIAL/PLATELET
Basophils Absolute: 0.1 10*3/uL (ref 0.0–0.1)
Basophils Relative: 0.9 % (ref 0.0–3.0)
Eosinophils Absolute: 0.2 10*3/uL (ref 0.0–0.7)
Eosinophils Relative: 2.9 % (ref 0.0–5.0)
HCT: 34.3 % — ABNORMAL LOW (ref 36.0–46.0)
Hemoglobin: 11.2 g/dL — ABNORMAL LOW (ref 12.0–15.0)
Lymphocytes Relative: 26.6 % (ref 12.0–46.0)
Lymphs Abs: 1.9 10*3/uL (ref 0.7–4.0)
MCHC: 32.6 g/dL (ref 30.0–36.0)
MCV: 90.2 fl (ref 78.0–100.0)
Monocytes Absolute: 0.7 10*3/uL (ref 0.1–1.0)
Monocytes Relative: 10.1 % (ref 3.0–12.0)
Neutro Abs: 4.1 10*3/uL (ref 1.4–7.7)
Neutrophils Relative %: 59.5 % (ref 43.0–77.0)
Platelets: 228 10*3/uL (ref 150.0–400.0)
RBC: 3.81 Mil/uL — ABNORMAL LOW (ref 3.87–5.11)
RDW: 15.3 % (ref 11.5–15.5)
WBC: 7 10*3/uL (ref 4.0–10.5)

## 2019-02-12 LAB — LIPID PANEL
Cholesterol: 221 mg/dL — ABNORMAL HIGH (ref 0–200)
HDL: 78 mg/dL (ref >39.00)
LDL Cholesterol: 120 mg/dL — ABNORMAL HIGH (ref 0–99)
NonHDL: 143.16
Total CHOL/HDL Ratio: 3
Triglycerides: 117 mg/dL (ref 0.0–149.0)
VLDL: 23.4 mg/dL (ref 0.0–40.0)

## 2019-02-12 LAB — HEPATIC FUNCTION PANEL
ALT: 16 U/L (ref 0–35)
AST: 14 U/L (ref 0–37)
Albumin: 4.2 g/dL (ref 3.5–5.2)
Alkaline Phosphatase: 61 U/L (ref 39–117)
Bilirubin, Direct: 0.1 mg/dL (ref 0.0–0.3)
Total Bilirubin: 0.5 mg/dL (ref 0.2–1.2)
Total Protein: 6.8 g/dL (ref 6.0–8.3)

## 2019-02-12 LAB — BASIC METABOLIC PANEL
BUN: 29 mg/dL — ABNORMAL HIGH (ref 6–23)
CO2: 27 mEq/L (ref 19–32)
Calcium: 9.6 mg/dL (ref 8.4–10.5)
Chloride: 105 mEq/L (ref 96–112)
Creatinine, Ser: 0.95 mg/dL (ref 0.40–1.20)
GFR: 57.64 mL/min — ABNORMAL LOW (ref >60.00)
Glucose, Bld: 93 mg/dL (ref 70–99)
Potassium: 3.7 mEq/L (ref 3.5–5.1)
Sodium: 141 mEq/L (ref 135–145)

## 2019-02-12 NOTE — Progress Notes (Signed)
Patient ID: Amanda Browning, female   DOB: 12-14-1945, 73 y.o.   MRN: [id number]   Subjective:    Patient ID: Amanda Browning, female    DOB: May 21, 1945, 73 y.o.   MRN: [id number]  HPI  Patient here for a scheduled follow up.  She reports she is doing relatively well.  Handling stress.  Is back exercising.  No chest pain or sob with increased activity or exertion.  She did recently try a keto shake.  Made her sick.  Had decreased appetite and diarrhea.  Stopped.  Since her appetite has improved.  Diarrhea resolved.  She feels back to her baseline.  During this period, she had a throb in her chest.  Just a couple of episodes that occurred when she was sitting.  Resolved after 5 minutes with no intervention. No chest pain at other times.  Specifically denies any symptoms with increased activity or exertion.  Since exercising, her sob has improved. No abdominal pain.     Past Medical History:  Diagnosis Date  . Anemia   . Chicken pox   . Colon polyps   . Complication of anesthesia    doesn't take much anesthesia to put pt to sleep, slow to wake up  . DDD (degenerative disc disease), cervical 02/05/13   C5-C6 and C6-C7  . GERD (gastroesophageal reflux disease)   . History of chicken pox   . Hypercholesterolemia   . Hyperlipidemia   . Hypertension   . Hypothyroidism   . Migraines   . PONV (postoperative nausea and vomiting)   . Squamous cell skin cancer    Past Surgical History:  Procedure Laterality Date  . BREAST BIOPSY Right 2012   stereotactic  . COLONOSCOPY WITH PROPOFOL N/A 02/23/2018   Procedure: COLONOSCOPY WITH PROPOFOL;  Surgeon: Manya Silvas, MD;  Location: [facility] ENDOSCOPY;  Service: Endoscopy;  Laterality: N/A;  . KNEE ARTHROPLASTY Right 04/17/2017   Procedure: COMPUTER ASSISTED TOTAL KNEE ARTHROPLASTY;  Surgeon: Dereck Leep, MD;  Location: [facility] ORS;  Service: Orthopedics;  Laterality: Right;  . SKIN BIOPSY    . VAGINAL HYSTERECTOMY  1994   ovaries not removed   . WISDOM TOOTH EXTRACTION     Family History  Problem Relation Age of Onset  . Heart disease Mother   . Stroke Mother   . Hypertension Mother   . Heart attack Mother   . Heart disease Father        enlarged heart  . Stroke Father   . Hypertension Father   . Hyperlipidemia Father   . Heart attack Father   . Ovarian cancer Paternal Aunt   . Breast cancer Sister 78  . Cancer Sister        Breast Cancer  . Diabetes type II Brother   . Hypertension Brother   . Colon cancer Neg Hx   . Basal cell carcinoma Neg Hx   . Melanoma Neg Hx   . Squamous cell carcinoma Neg Hx    Social History   Socioeconomic History  . Marital status: Divorced    Spouse name: Not on file  . Number of children: 2  . Years of education: 44  . Highest education level: High school graduate  Occupational History  . Not on file  Social Needs  . Financial resource strain: Not hard at all  . Food insecurity    Worry: Never true    Inability: Never true  . Transportation needs    Medical: No  Non-medical: No  Tobacco Use  . Smoking status: Never Smoker  . Smokeless tobacco: Never Used  Substance and Sexual Activity  . Alcohol use: No    Alcohol/week: 0.0 standard drinks  . Drug use: No  . Sexual activity: Never  Lifestyle  . Physical activity    Days per week: 4 days    Minutes per session: 30 min  . Stress: Not at all  Relationships  . Social Herbalist on phone: Not on file    Gets together: Not on file    Attends religious service: Not on file    Active member of club or organization: Not on file    Attends meetings of clubs or organizations: Not on file    Relationship status: Not on file  Other Topics Concern  . Not on file  Social History Narrative   Full Code   Alcohol - none   Never smoker and no smokeless tobacco   2 daughters    Outpatient Encounter Medications as of 02/12/2019  Medication Sig  . acetaminophen (TYLENOL) 325 MG tablet Take 650 mg by mouth 4  (four) times daily as needed.  Marland Kitchen aspirin EC 81 MG tablet Take by mouth.  . cetirizine (ZYRTEC) 10 MG tablet Take 10 mg by mouth daily.  Marland Kitchen glucosamine-chondroitin 500-400 MG tablet Take 1 tablet by mouth daily.  Marland Kitchen levothyroxine (SYNTHROID) 25 MCG tablet TAKE 1 TABLET EVERY DAY BEFORE BREAKFAST  . lisinopril-hydrochlorothiazide (ZESTORETIC) 20-25 MG tablet TAKE 1 TABLET EVERY DAY  . magnesium oxide (MAG-OX) 400 MG tablet Take 1 tablet (400 mg total) by mouth 2 (two) times daily.  . Multiple Vitamin (MULTIVITAMIN) tablet Take 1 tablet by mouth daily.  Marland Kitchen omeprazole (PRILOSEC) 20 MG capsule TAKE 1 CAPSULE TWICE DAILY BEFORE A MEAL  . vitamin B-12 (CYANOCOBALAMIN) 1000 MCG tablet Take 1 tablet by mouth daily.  . [DISCONTINUED] lovastatin (MEVACOR) 40 MG tablet TAKE 1 TABLET AT BEDTIME   No facility-administered encounter medications on file as of 02/12/2019.    Review of Systems  Constitutional: Negative for appetite change and unexpected weight change.  HENT: Negative for congestion and sinus pressure.   Respiratory: Negative for cough, chest tightness and shortness of breath.   Cardiovascular: Negative for chest pain, palpitations and leg swelling.  Gastrointestinal: Negative for abdominal pain, diarrhea, nausea and vomiting.  Genitourinary: Negative for difficulty urinating and dysuria.  Musculoskeletal: Negative for joint swelling and myalgias.  Skin: Negative for color change and rash.  Neurological: Negative for dizziness, light-headedness and headaches.  Psychiatric/Behavioral: Negative for agitation and dysphoric mood.       Objective:    Physical Exam Constitutional:      General: She is not in acute distress.    Appearance: Normal appearance.  HENT:     Head: Normocephalic and atraumatic.     Right Ear: External ear normal.     Left Ear: External ear normal.  Eyes:     General: No scleral icterus.       Right eye: No discharge.        Left eye: No discharge.      Conjunctiva/sclera: Conjunctivae normal.  Neck:     Musculoskeletal: Neck supple. No muscular tenderness.     Thyroid: No thyromegaly.  Cardiovascular:     Rate and Rhythm: Normal rate and regular rhythm.  Pulmonary:     Effort: No respiratory distress.     Breath sounds: Normal breath sounds. No wheezing.  Abdominal:  General: Bowel sounds are normal.     Palpations: Abdomen is soft.     Tenderness: There is no abdominal tenderness.  Musculoskeletal:        General: No swelling or tenderness.  Lymphadenopathy:     Cervical: No cervical adenopathy.  Skin:    Findings: No erythema or rash.  Neurological:     Mental Status: She is alert.  Psychiatric:        Mood and Affect: Mood normal.        Behavior: Behavior normal.     BP 126/72   Pulse 85   Temp (!) 95.4 F (35.2 C)   Resp 16   Wt 211 lb 12.8 oz (96.1 kg)   SpO2 97%   BMI 40.02 kg/m  Wt Readings from Last 3 Encounters:  02/12/19 211 lb 12.8 oz (96.1 kg)  11/09/18 214 lb (97.1 kg)  02/23/18 207 lb (93.9 kg)     Lab Results  Component Value Date   WBC 7.0 02/12/2019   HGB 11.2 (L) 02/12/2019   HCT 34.3 (L) 02/12/2019   PLT 228.0 02/12/2019   GLUCOSE 93 02/12/2019   CHOL 221 (H) 02/12/2019   TRIG 117.0 02/12/2019   HDL 78.00 02/12/2019   LDLDIRECT 131.0 03/19/2013   LDLCALC 120 (H) 02/12/2019   ALT 16 02/12/2019   AST 14 02/12/2019   NA 141 02/12/2019   K 3.7 02/12/2019   CL 105 02/12/2019   CREATININE 0.95 02/12/2019   BUN 29 (H) 02/12/2019   CO2 27 02/12/2019   TSH 3.46 11/09/2018   INR 0.99 04/05/2017    Mm 3d Screen Breast Bilateral  Result Date: 03/23/2018 CLINICAL DATA:  Screening. EXAM: DIGITAL SCREENING BILATERAL MAMMOGRAM WITH TOMO AND CAD COMPARISON:  Previous exam(s). ACR Breast Density Category b: There are scattered areas of fibroglandular density. FINDINGS: There are no findings suspicious for malignancy. Images were processed with CAD. IMPRESSION: No mammographic evidence of  malignancy. A result letter of this screening mammogram will be mailed directly to the patient. RECOMMENDATION: Screening mammogram in one year. (Code:SM-B-01Y) BI-RADS CATEGORY  1: Negative. Electronically Signed   By: Abelardo Diesel M.D.   On: 03/23/2018 11:01       Assessment & Plan:   Problem List Items Addressed This Visit    Anemia    Follow cbc.       Relevant Orders   CBC with Differential/Platelet (Completed)   Chest pain    Intermittent chest pain as outlined - described as throb in her chest.  No chest pain or sob with exertion.  EKG last visit.  She declined cardiology referral.  Wants to follow.  Will notify me if symptoms change or any worsening symptoms.        GERD (gastroesophageal reflux disease)    Controlled on omeprazole.        Hypercholesterolemia    On lovastatin.  Low cholesterol diet and exercise.  Follow lipid panel and liver function tests.        Relevant Orders   Hepatic function panel (Completed)   Lipid panel (Completed)   Hypertension    Blood pressure under good control.  Continue same medication regimen.  Follow pressures.  Follow metabolic panel.        Relevant Orders   Basic metabolic panel (Completed)   Hypothyroidism    On thyroid replacement.  Follow tsh.        Stress    Overall feels things are stable.  Follow.  Other Visit Diagnoses    Visit for screening mammogram    -  Primary   Relevant Orders   MM 3D SCREEN BREAST BILATERAL   Need for immunization against influenza       Relevant Orders   Flu Vaccine QUAD High Dose(Fluad) (Completed)       Einar Pheasant, MD

## 2019-02-12 NOTE — Progress Notes (Unsigned)
Order placed for add on labs.   °

## 2019-02-13 ENCOUNTER — Other Ambulatory Visit: Payer: Medicare HMO

## 2019-02-13 DIAGNOSIS — D649 Anemia, unspecified: Secondary | ICD-10-CM | POA: Diagnosis not present

## 2019-02-14 ENCOUNTER — Other Ambulatory Visit: Payer: Self-pay

## 2019-02-14 DIAGNOSIS — Z20828 Contact with and (suspected) exposure to other viral communicable diseases: Secondary | ICD-10-CM | POA: Diagnosis not present

## 2019-02-14 DIAGNOSIS — Z20822 Contact with and (suspected) exposure to covid-19: Secondary | ICD-10-CM

## 2019-02-14 LAB — IRON,TIBC AND FERRITIN PANEL
%SAT: 16 % (calc) (ref 16–45)
Ferritin: 12 ng/mL — ABNORMAL LOW (ref 16–288)
Iron: 69 ug/dL (ref 45–160)
TIBC: 443 mcg/dL (calc) (ref 250–450)

## 2019-02-14 LAB — VITAMIN B12: Vitamin B-12: >2000 pg/mL — ABNORMAL HIGH (ref 200–1100)

## 2019-02-15 ENCOUNTER — Other Ambulatory Visit: Payer: Self-pay | Admitting: Internal Medicine

## 2019-02-15 LAB — NOVEL CORONAVIRUS, NAA: SARS-CoV-2, NAA: NOT DETECTED

## 2019-02-17 ENCOUNTER — Encounter: Payer: Self-pay | Admitting: Internal Medicine

## 2019-02-17 DIAGNOSIS — R079 Chest pain, unspecified: Secondary | ICD-10-CM | POA: Insufficient documentation

## 2019-02-17 NOTE — Assessment & Plan Note (Signed)
On lovastatin.  Low cholesterol diet and exercise.  Follow lipid panel and liver function tests.   

## 2019-02-17 NOTE — Assessment & Plan Note (Signed)
Intermittent chest pain as outlined - described as throb in her chest.  No chest pain or sob with exertion.  EKG last visit.  She declined cardiology referral.  Wants to follow.  Will notify me if symptoms change or any worsening symptoms.

## 2019-02-17 NOTE — Assessment & Plan Note (Signed)
Overall feels things are stable.  Follow.

## 2019-02-17 NOTE — Assessment & Plan Note (Signed)
Follow cbc.  

## 2019-02-17 NOTE — Assessment & Plan Note (Signed)
Controlled on omeprazole.   

## 2019-02-17 NOTE — Assessment & Plan Note (Signed)
On thyroid replacement.  Follow tsh.  

## 2019-02-17 NOTE — Assessment & Plan Note (Signed)
Blood pressure under good control.  Continue same medication regimen.  Follow pressures.  Follow metabolic panel.   

## 2019-02-18 ENCOUNTER — Other Ambulatory Visit: Payer: Self-pay | Admitting: Internal Medicine

## 2019-02-19 ENCOUNTER — Other Ambulatory Visit: Payer: Self-pay | Admitting: Internal Medicine

## 2019-02-19 DIAGNOSIS — D649 Anemia, unspecified: Secondary | ICD-10-CM

## 2019-02-19 NOTE — Progress Notes (Signed)
Order placed for f/u labs.  

## 2019-03-06 ENCOUNTER — Ambulatory Visit: Payer: Medicare HMO | Admitting: Internal Medicine

## 2019-03-18 ENCOUNTER — Other Ambulatory Visit: Payer: Self-pay

## 2019-03-18 DIAGNOSIS — Z20822 Contact with and (suspected) exposure to covid-19: Secondary | ICD-10-CM

## 2019-03-20 LAB — NOVEL CORONAVIRUS, NAA: SARS-CoV-2, NAA: NOT DETECTED

## 2019-03-26 ENCOUNTER — Other Ambulatory Visit: Payer: Medicare HMO

## 2019-03-27 ENCOUNTER — Other Ambulatory Visit: Payer: Medicare HMO

## 2019-04-08 ENCOUNTER — Other Ambulatory Visit: Payer: Self-pay

## 2019-04-10 ENCOUNTER — Ambulatory Visit
Admission: RE | Admit: 2019-04-10 | Discharge: 2019-04-10 | Disposition: A | Discharge status: Home / Self Care | Payer: Medicare HMO | Source: Ambulatory Visit | Attending: Internal Medicine | Admitting: Internal Medicine

## 2019-04-10 ENCOUNTER — Other Ambulatory Visit (INDEPENDENT_AMBULATORY_CARE_PROVIDER_SITE_OTHER): Payer: Medicare HMO

## 2019-04-10 ENCOUNTER — Other Ambulatory Visit: Payer: Self-pay

## 2019-04-10 DIAGNOSIS — D649 Anemia, unspecified: Secondary | ICD-10-CM

## 2019-04-10 DIAGNOSIS — Z1231 Encounter for screening mammogram for malignant neoplasm of breast: Secondary | ICD-10-CM | POA: Diagnosis not present

## 2019-04-10 LAB — CBC WITH DIFFERENTIAL/PLATELET
Basophils Absolute: 0.1 10*3/uL (ref 0.0–0.1)
Basophils Relative: 1.1 % (ref 0.0–3.0)
Eosinophils Absolute: 0.2 10*3/uL (ref 0.0–0.7)
Eosinophils Relative: 3.9 % (ref 0.0–5.0)
HCT: 36.6 % (ref 36.0–46.0)
Hemoglobin: 11.7 g/dL — ABNORMAL LOW (ref 12.0–15.0)
Lymphocytes Relative: 34.6 % (ref 12.0–46.0)
Lymphs Abs: 1.8 10*3/uL (ref 0.7–4.0)
MCHC: 32 g/dL (ref 30.0–36.0)
MCV: 97.1 fl (ref 78.0–100.0)
Monocytes Absolute: 0.5 10*3/uL (ref 0.1–1.0)
Monocytes Relative: 8.8 % (ref 3.0–12.0)
Neutro Abs: 2.7 10*3/uL (ref 1.4–7.7)
Neutrophils Relative %: 51.6 % (ref 43.0–77.0)
Platelets: 256 10*3/uL (ref 150.0–400.0)
RBC: 3.77 Mil/uL — ABNORMAL LOW (ref 3.87–5.11)
RDW: 16.6 % — ABNORMAL HIGH (ref 11.5–15.5)
WBC: 5.2 10*3/uL (ref 4.0–10.5)

## 2019-04-10 LAB — FERRITIN: Ferritin: 28.2 ng/mL (ref 10.0–291.0)

## 2019-04-11 ENCOUNTER — Other Ambulatory Visit: Payer: Self-pay | Admitting: Internal Medicine

## 2019-04-11 DIAGNOSIS — I10 Essential (primary) hypertension: Secondary | ICD-10-CM

## 2019-04-11 DIAGNOSIS — D649 Anemia, unspecified: Secondary | ICD-10-CM

## 2019-04-11 DIAGNOSIS — E7800 Pure hypercholesterolemia, unspecified: Secondary | ICD-10-CM

## 2019-04-11 NOTE — Progress Notes (Signed)
Orders placed for f/u labs.  

## 2019-04-13 ENCOUNTER — Other Ambulatory Visit: Payer: Self-pay | Admitting: Internal Medicine

## 2019-04-13 DIAGNOSIS — D509 Iron deficiency anemia, unspecified: Secondary | ICD-10-CM

## 2019-04-13 NOTE — Progress Notes (Signed)
Order placed for GI referral.   

## 2019-04-24 ENCOUNTER — Other Ambulatory Visit: Payer: Self-pay | Admitting: Internal Medicine

## 2019-05-08 DIAGNOSIS — K219 Gastro-esophageal reflux disease without esophagitis: Secondary | ICD-10-CM | POA: Diagnosis not present

## 2019-05-08 DIAGNOSIS — I10 Essential (primary) hypertension: Secondary | ICD-10-CM | POA: Diagnosis not present

## 2019-05-08 DIAGNOSIS — D509 Iron deficiency anemia, unspecified: Secondary | ICD-10-CM | POA: Diagnosis not present

## 2019-06-21 ENCOUNTER — Other Ambulatory Visit: Payer: Self-pay

## 2019-06-21 ENCOUNTER — Other Ambulatory Visit: Payer: Medicare HMO

## 2019-06-21 ENCOUNTER — Other Ambulatory Visit: Payer: Self-pay | Admitting: Internal Medicine

## 2019-06-21 ENCOUNTER — Other Ambulatory Visit (INDEPENDENT_AMBULATORY_CARE_PROVIDER_SITE_OTHER): Payer: Medicare HMO

## 2019-06-21 DIAGNOSIS — D649 Anemia, unspecified: Secondary | ICD-10-CM | POA: Diagnosis not present

## 2019-06-21 DIAGNOSIS — I10 Essential (primary) hypertension: Secondary | ICD-10-CM

## 2019-06-21 DIAGNOSIS — E7800 Pure hypercholesterolemia, unspecified: Secondary | ICD-10-CM | POA: Diagnosis not present

## 2019-06-21 LAB — CBC WITH DIFFERENTIAL/PLATELET
Basophils Absolute: 0.1 10*3/uL (ref 0.0–0.1)
Basophils Relative: 0.8 % (ref 0.0–3.0)
Eosinophils Absolute: 0.2 10*3/uL (ref 0.0–0.7)
Eosinophils Relative: 3.8 % (ref 0.0–5.0)
HCT: 40.8 % (ref 36.0–46.0)
Hemoglobin: 13.5 g/dL (ref 12.0–15.0)
Lymphocytes Relative: 31.8 % (ref 12.0–46.0)
Lymphs Abs: 1.9 10*3/uL (ref 0.7–4.0)
MCHC: 33.2 g/dL (ref 30.0–36.0)
MCV: 96.9 fl (ref 78.0–100.0)
Monocytes Absolute: 0.6 10*3/uL (ref 0.1–1.0)
Monocytes Relative: 10.3 % (ref 3.0–12.0)
Neutro Abs: 3.1 10*3/uL (ref 1.4–7.7)
Neutrophils Relative %: 53.3 % (ref 43.0–77.0)
Platelets: 168 10*3/uL (ref 150.0–400.0)
RBC: 4.21 Mil/uL (ref 3.87–5.11)
RDW: 12.6 % (ref 11.5–15.5)
WBC: 5.9 10*3/uL (ref 4.0–10.5)

## 2019-06-21 LAB — LIPID PANEL
Cholesterol: 193 mg/dL (ref 0–200)
HDL: 68.8 mg/dL (ref >39.00)
LDL Cholesterol: 107 mg/dL — ABNORMAL HIGH (ref 0–99)
NonHDL: 124.24
Total CHOL/HDL Ratio: 3
Triglycerides: 87 mg/dL (ref 0.0–149.0)
VLDL: 17.4 mg/dL (ref 0.0–40.0)

## 2019-06-21 LAB — IBC + FERRITIN
Ferritin: 43.2 ng/mL (ref 10.0–291.0)
Iron: 131 ug/dL (ref 42–145)
Saturation Ratios: 38.8 % (ref 20.0–50.0)
Transferrin: 241 mg/dL (ref 212.0–360.0)

## 2019-06-21 LAB — BASIC METABOLIC PANEL
BUN: 16 mg/dL (ref 6–23)
CO2: 30 mEq/L (ref 19–32)
Calcium: 9.7 mg/dL (ref 8.4–10.5)
Chloride: 99 mEq/L (ref 96–112)
Creatinine, Ser: 0.94 mg/dL (ref 0.40–1.20)
GFR: 58.3 mL/min — ABNORMAL LOW (ref >60.00)
Glucose, Bld: 95 mg/dL (ref 70–99)
Potassium: 4.1 mEq/L (ref 3.5–5.1)
Sodium: 137 mEq/L (ref 135–145)

## 2019-06-21 LAB — HEPATIC FUNCTION PANEL
ALT: 18 U/L (ref 0–35)
AST: 19 U/L (ref 0–37)
Albumin: 3.8 g/dL (ref 3.5–5.2)
Alkaline Phosphatase: 62 U/L (ref 39–117)
Bilirubin, Direct: 0.1 mg/dL (ref 0.0–0.3)
Total Bilirubin: 0.8 mg/dL (ref 0.2–1.2)
Total Protein: 6.3 g/dL (ref 6.0–8.3)

## 2019-06-21 LAB — FERRITIN: Ferritin: 43.2 ng/mL (ref 10.0–291.0)

## 2019-06-26 ENCOUNTER — Telehealth: Payer: Self-pay

## 2019-06-26 ENCOUNTER — Ambulatory Visit: Payer: Medicare HMO | Admitting: Internal Medicine

## 2019-06-26 NOTE — Telephone Encounter (Signed)
Patient cancelled her appt for today and had labs to discuss. Please review and let me know so I can notify her. She does have my chart as well.

## 2019-06-27 ENCOUNTER — Other Ambulatory Visit: Payer: Self-pay | Admitting: Internal Medicine

## 2019-06-27 NOTE — Telephone Encounter (Signed)
Notify pt that her hgb and iron studies have improved.  Bad cholesterol improved.  Liver function tests wnl.  Need to reschedule her appt she canceled

## 2019-06-27 NOTE — Telephone Encounter (Signed)
Pt aware of results and scheduled for 4/12. Had to schedule out a few weeks due to being exposed to covid. Pt doing ok. No symptoms.

## 2019-06-27 NOTE — Telephone Encounter (Signed)
Pt returned your call.  

## 2019-07-15 ENCOUNTER — Other Ambulatory Visit: Payer: Self-pay | Admitting: Internal Medicine

## 2019-07-29 ENCOUNTER — Other Ambulatory Visit: Payer: Self-pay

## 2019-07-29 ENCOUNTER — Ambulatory Visit (INDEPENDENT_AMBULATORY_CARE_PROVIDER_SITE_OTHER): Payer: Medicare HMO | Admitting: Internal Medicine

## 2019-07-29 DIAGNOSIS — E7800 Pure hypercholesterolemia, unspecified: Secondary | ICD-10-CM

## 2019-07-29 DIAGNOSIS — F439 Reaction to severe stress, unspecified: Secondary | ICD-10-CM

## 2019-07-29 DIAGNOSIS — R0602 Shortness of breath: Secondary | ICD-10-CM

## 2019-07-29 DIAGNOSIS — K219 Gastro-esophageal reflux disease without esophagitis: Secondary | ICD-10-CM

## 2019-07-29 DIAGNOSIS — E039 Hypothyroidism, unspecified: Secondary | ICD-10-CM

## 2019-07-29 DIAGNOSIS — I10 Essential (primary) hypertension: Secondary | ICD-10-CM | POA: Diagnosis not present

## 2019-07-29 DIAGNOSIS — G479 Sleep disorder, unspecified: Secondary | ICD-10-CM | POA: Diagnosis not present

## 2019-07-29 DIAGNOSIS — D649 Anemia, unspecified: Secondary | ICD-10-CM

## 2019-07-29 DIAGNOSIS — Z6841 Body Mass Index (BMI) 40.0 and over, adult: Secondary | ICD-10-CM | POA: Diagnosis not present

## 2019-07-29 NOTE — Progress Notes (Signed)
Patient ID: Amanda Browning, female   DOB: 1945-06-04, 74 y.o.   MRN: [id number]   Subjective:    Patient ID: Amanda Browning, female    DOB: Apr 08, 1946, 74 y.o.   MRN: [id number]  HPI This visit occurred during the SARS-CoV-2 public health emergency.  Safety protocols were in place, including screening questions prior to the visit, additional usage of staff PPE, and extensive cleaning of exam room while observing appropriate contact time as indicated for disinfecting solutions.  Patient here for a scheduled follow up.  She reports she is doing relatively well.  Stays active.  No chest pain, but does report some persistent sob with increased activity and exertion.  States if she paces herself, she does ok.  No acid reflux reported.  No abdominal pain.  Bowels moving.  Recently saw GI.  Recommended to follow.  Last colonoscopy 2019.  hgb on recent check improved and wnl.  Blood pressures averaging 130/75-80.  She is back on trazodone to help her sleep.  Only taking 1/2 tablet per day.  Doing well with this dose.    Past Medical History:  Diagnosis Date  . Anemia   . Chicken pox   . Colon polyps   . Complication of anesthesia    doesn't take much anesthesia to put pt to sleep, slow to wake up  . DDD (degenerative disc disease), cervical 02/05/13   C5-C6 and C6-C7  . GERD (gastroesophageal reflux disease)   . History of chicken pox   . Hypercholesterolemia   . Hyperlipidemia   . Hypertension   . Hypothyroidism   . Migraines   . PONV (postoperative nausea and vomiting)   . Squamous cell skin cancer    Past Surgical History:  Procedure Laterality Date  . BREAST BIOPSY Right 2012   stereotactic  . COLONOSCOPY WITH PROPOFOL N/A 02/23/2018   Procedure: COLONOSCOPY WITH PROPOFOL;  Surgeon: Manya Silvas, MD;  Location: [facility] ENDOSCOPY;  Service: Endoscopy;  Laterality: N/A;  . KNEE ARTHROPLASTY Right 04/17/2017   Procedure: COMPUTER ASSISTED TOTAL KNEE ARTHROPLASTY;  Surgeon: Dereck Leep, MD;  Location: [facility] ORS;  Service: Orthopedics;  Laterality: Right;  . SKIN BIOPSY    . VAGINAL HYSTERECTOMY  1994   ovaries not removed  . WISDOM TOOTH EXTRACTION     Family History  Problem Relation Age of Onset  . Heart disease Mother   . Stroke Mother   . Hypertension Mother   . Heart attack Mother   . Heart disease Father        enlarged heart  . Stroke Father   . Hypertension Father   . Hyperlipidemia Father   . Heart attack Father   . Ovarian cancer Paternal Aunt   . Breast cancer Sister 77  . Cancer Sister        Breast Cancer  . Diabetes type II Brother   . Hypertension Brother   . Colon cancer Neg Hx   . Basal cell carcinoma Neg Hx   . Melanoma Neg Hx   . Squamous cell carcinoma Neg Hx    Social History   Socioeconomic History  . Marital status: Divorced    Spouse name: Not on file  . Number of children: 2  . Years of education: 37  . Highest education level: High school graduate  Occupational History  . Not on file  Tobacco Use  . Smoking status: Never Smoker  . Smokeless tobacco: Never Used  Substance and Sexual Activity  .  Alcohol use: No    Alcohol/week: 0.0 standard drinks  . Drug use: No  . Sexual activity: Never  Other Topics Concern  . Not on file  Social History Narrative   Full Code   Alcohol - none   Never smoker and no smokeless tobacco   2 daughters   Social Determinants of Health   Financial Resource Strain:   . Difficulty of Paying Living Expenses:   Food Insecurity:   . Worried About Charity fundraiser in the Last Year:   . Arboriculturist in the Last Year:   Transportation Needs:   . Film/video editor (Medical):   Marland Kitchen Lack of Transportation (Non-Medical):   Physical Activity:   . Days of Exercise per Week:   . Minutes of Exercise per Session:   Stress:   . Feeling of Stress :   Social Connections:   . Frequency of Communication with Friends and Family:   . Frequency of Social Gatherings with Friends and  Family:   . Attends Religious Services:   . Active Member of Clubs or Organizations:   . Attends Archivist Meetings:   Marland Kitchen Marital Status:     Outpatient Encounter Medications as of 07/29/2019  Medication Sig  . acetaminophen (TYLENOL) 325 MG tablet Take 650 mg by mouth 4 (four) times daily as needed.  Marland Kitchen aspirin EC 81 MG tablet Take by mouth.  . cetirizine (ZYRTEC) 10 MG tablet Take 10 mg by mouth daily.  Marland Kitchen glucosamine-chondroitin 500-400 MG tablet Take 1 tablet by mouth daily.  Marland Kitchen levothyroxine (SYNTHROID) 25 MCG tablet TAKE 1 TABLET EVERY DAY BEFORE BREAKFAST  . lisinopril-hydrochlorothiazide (ZESTORETIC) 20-25 MG tablet TAKE 1 TABLET EVERY DAY  . lovastatin (MEVACOR) 40 MG tablet TAKE 1 TABLET AT BEDTIME  . magnesium oxide (MAG-OX) 400 MG tablet Take 1 tablet (400 mg total) by mouth 2 (two) times daily.  . Multiple Vitamin (MULTIVITAMIN) tablet Take 1 tablet by mouth daily.  Marland Kitchen omeprazole (PRILOSEC) 20 MG capsule TAKE 1 CAPSULE TWICE DAILY BEFORE MEALS  . vitamin B-12 (CYANOCOBALAMIN) 1000 MCG tablet Take 1 tablet by mouth daily.   No facility-administered encounter medications on file as of 07/29/2019.    Review of Systems  Constitutional: Negative for appetite change and unexpected weight change.  HENT: Negative for congestion and sinus pressure.   Respiratory: Positive for shortness of breath. Negative for cough and chest tightness.        Shortness of breath with exertion.    Cardiovascular: Negative for chest pain, palpitations and leg swelling.  Gastrointestinal: Negative for abdominal pain, diarrhea and nausea.  Genitourinary: Negative for difficulty urinating and dysuria.  Musculoskeletal: Negative for joint swelling and myalgias.  Skin: Negative for color change and rash.  Neurological: Negative for dizziness, light-headedness and headaches.  Psychiatric/Behavioral: Positive for sleep disturbance. Negative for agitation and dysphoric mood.       Objective:     Physical Exam Vitals reviewed.  Constitutional:      General: She is not in acute distress.    Appearance: Normal appearance.  HENT:     Head: Normocephalic and atraumatic.     Right Ear: External ear normal.     Left Ear: External ear normal.  Eyes:     General: No scleral icterus.       Right eye: No discharge.        Left eye: No discharge.  Neck:     Thyroid: No thyromegaly.  Cardiovascular:  Rate and Rhythm: Normal rate and regular rhythm.  Pulmonary:     Effort: No respiratory distress.     Breath sounds: Normal breath sounds. No wheezing.  Abdominal:     General: Bowel sounds are normal.     Palpations: Abdomen is soft.     Tenderness: There is no abdominal tenderness.  Musculoskeletal:        General: No swelling or tenderness.     Cervical back: Neck supple. No tenderness.  Lymphadenopathy:     Cervical: No cervical adenopathy.  Skin:    Findings: No erythema or rash.  Neurological:     Mental Status: She is alert.  Psychiatric:        Mood and Affect: Mood normal.        Behavior: Behavior normal.     BP 128/76   Pulse 68   Temp (!) 97.5 F (36.4 C)   Resp 16   Wt 213 lb 12.8 oz (97 kg)   SpO2 97%   BMI 40.40 kg/m  Wt Readings from Last 3 Encounters:  07/29/19 213 lb 12.8 oz (97 kg)  02/12/19 211 lb 12.8 oz (96.1 kg)  11/09/18 214 lb (97.1 kg)     Lab Results  Component Value Date   WBC 5.9 06/21/2019   HGB 13.5 06/21/2019   HCT 40.8 06/21/2019   PLT 168.0 06/21/2019   GLUCOSE 95 06/21/2019   CHOL 193 06/21/2019   TRIG 87.0 06/21/2019   HDL 68.80 06/21/2019   LDLDIRECT 131.0 03/19/2013   LDLCALC 107 (H) 06/21/2019   ALT 18 06/21/2019   AST 19 06/21/2019   NA 137 06/21/2019   K 4.1 06/21/2019   CL 99 06/21/2019   CREATININE 0.94 06/21/2019   BUN 16 06/21/2019   CO2 30 06/21/2019   TSH 3.46 11/09/2018   INR 0.99 04/05/2017    MM 3D SCREEN BREAST BILATERAL  Result Date: 04/10/2019 CLINICAL DATA:  Screening. EXAM: DIGITAL  SCREENING BILATERAL MAMMOGRAM WITH TOMO AND CAD COMPARISON:  Previous exam(s). ACR Breast Density Category b: There are scattered areas of fibroglandular density. FINDINGS: There are no findings suspicious for malignancy. Images were processed with CAD. IMPRESSION: No mammographic evidence of malignancy. A result letter of this screening mammogram will be mailed directly to the patient. RECOMMENDATION: Screening mammogram in one year. (Code:SM-B-01Y) BI-RADS CATEGORY  1: Negative. Electronically Signed   By: Lajean Manes M.D.   On: 04/10/2019 12:53       Assessment & Plan:   Problem List Items Addressed This Visit    Anemia    Follow cbc.       Relevant Orders   CBC with Differential/Platelet   Ferritin   Difficulty sleeping    Back on trazodone now.  Taking 1/2 tablet q day.  Sleeping well.  Follow.        GERD (gastroesophageal reflux disease)    Controlled on omeprazole.        Hypercholesterolemia    On lovastatin.  Low cholesterol diet and exercise.  Follow lipid panel and liver function tests.        Relevant Orders   Hepatic function panel   Lipid panel   Hypertension    Blood pressure as outlined - on lisinopril/hctz.  Have her spot check her pressure.  States her pressures are averaging:  130/75-80.   Follow.  Check metabolic panel.       Relevant Orders   Basic metabolic panel   Hypothyroidism    On thyroid  replacement.  Follow tsh.        Relevant Orders   TSH   SOB (shortness of breath) on exertion    Saw cardiology previously.  With persistent sob with exertion.  Discussed repeat EKG.  She declines.  Does request referral to cardiology to determine if further cardiac w/up warranted.        Stress    Overall appears to be handling things relatively well.  using trazodone to help her sleep.  Sleeping better. Follow.           Einar Pheasant, MD

## 2019-08-03 ENCOUNTER — Encounter: Payer: Self-pay | Admitting: Internal Medicine

## 2019-08-03 NOTE — Assessment & Plan Note (Signed)
On lovastatin.  Low cholesterol diet and exercise.  Follow lipid panel and liver function tests.   

## 2019-08-03 NOTE — Assessment & Plan Note (Signed)
Follow cbc.  

## 2019-08-03 NOTE — Assessment & Plan Note (Signed)
Overall appears to be handling things relatively well.  using trazodone to help her sleep.  Sleeping better. Follow.

## 2019-08-03 NOTE — Assessment & Plan Note (Signed)
Saw cardiology previously.  With persistent sob with exertion.  Discussed repeat EKG.  She declines.  Does request referral to cardiology to determine if further cardiac w/up warranted.

## 2019-08-03 NOTE — Assessment & Plan Note (Addendum)
Blood pressure as outlined - on lisinopril/hctz.  Have her spot check her pressure.  States her pressures are averaging:  130/75-80.   Follow.  Check metabolic panel.

## 2019-08-03 NOTE — Assessment & Plan Note (Signed)
Back on trazodone now.  Taking 1/2 tablet q day.  Sleeping well.  Follow.

## 2019-08-03 NOTE — Assessment & Plan Note (Signed)
Controlled on omeprazole.   

## 2019-08-03 NOTE — Assessment & Plan Note (Signed)
On thyroid replacement.  Follow tsh.  

## 2019-09-03 ENCOUNTER — Other Ambulatory Visit: Payer: Self-pay | Admitting: Internal Medicine

## 2019-09-27 DIAGNOSIS — X32XXXA Exposure to sunlight, initial encounter: Secondary | ICD-10-CM | POA: Diagnosis not present

## 2019-09-27 DIAGNOSIS — L821 Other seborrheic keratosis: Secondary | ICD-10-CM | POA: Diagnosis not present

## 2019-09-27 DIAGNOSIS — L814 Other melanin hyperpigmentation: Secondary | ICD-10-CM | POA: Diagnosis not present

## 2019-09-27 DIAGNOSIS — S70361A Insect bite (nonvenomous), right thigh, initial encounter: Secondary | ICD-10-CM | POA: Diagnosis not present

## 2019-09-27 DIAGNOSIS — L570 Actinic keratosis: Secondary | ICD-10-CM | POA: Diagnosis not present

## 2019-11-08 ENCOUNTER — Other Ambulatory Visit: Payer: Self-pay | Admitting: Internal Medicine

## 2019-11-16 ENCOUNTER — Other Ambulatory Visit: Payer: Self-pay | Admitting: Internal Medicine

## 2019-11-21 ENCOUNTER — Other Ambulatory Visit: Payer: Self-pay | Admitting: Internal Medicine

## 2019-11-25 ENCOUNTER — Other Ambulatory Visit (INDEPENDENT_AMBULATORY_CARE_PROVIDER_SITE_OTHER): Payer: Medicare HMO

## 2019-11-25 ENCOUNTER — Other Ambulatory Visit: Payer: Self-pay

## 2019-11-25 DIAGNOSIS — D649 Anemia, unspecified: Secondary | ICD-10-CM

## 2019-11-25 DIAGNOSIS — E039 Hypothyroidism, unspecified: Secondary | ICD-10-CM | POA: Diagnosis not present

## 2019-11-25 DIAGNOSIS — I10 Essential (primary) hypertension: Secondary | ICD-10-CM | POA: Diagnosis not present

## 2019-11-25 DIAGNOSIS — E7800 Pure hypercholesterolemia, unspecified: Secondary | ICD-10-CM | POA: Diagnosis not present

## 2019-11-25 LAB — CBC WITH DIFFERENTIAL/PLATELET
Basophils Absolute: 0 10*3/uL (ref 0.0–0.1)
Basophils Relative: 1 % (ref 0.0–3.0)
Eosinophils Absolute: 0.1 10*3/uL (ref 0.0–0.7)
Eosinophils Relative: 2.8 % (ref 0.0–5.0)
HCT: 40.2 % (ref 36.0–46.0)
Hemoglobin: 13.7 g/dL (ref 12.0–15.0)
Lymphocytes Relative: 34.6 % (ref 12.0–46.0)
Lymphs Abs: 1.7 10*3/uL (ref 0.7–4.0)
MCHC: 34.1 g/dL (ref 30.0–36.0)
MCV: 98.2 fl (ref 78.0–100.0)
Monocytes Absolute: 0.5 10*3/uL (ref 0.1–1.0)
Monocytes Relative: 10.8 % (ref 3.0–12.0)
Neutro Abs: 2.4 10*3/uL (ref 1.4–7.7)
Neutrophils Relative %: 50.8 % (ref 43.0–77.0)
Platelets: 183 10*3/uL (ref 150.0–400.0)
RBC: 4.09 Mil/uL (ref 3.87–5.11)
RDW: 11.6 % (ref 11.5–15.5)
WBC: 4.8 10*3/uL (ref 4.0–10.5)

## 2019-11-25 LAB — BASIC METABOLIC PANEL
BUN: 7 mg/dL (ref 6–23)
CO2: 21 mEq/L (ref 19–32)
Calcium: 9.5 mg/dL (ref 8.4–10.5)
Chloride: 96 mEq/L (ref 96–112)
Creatinine, Ser: 0.9 mg/dL (ref 0.40–1.20)
GFR: 61.22 mL/min (ref >60.00)
Glucose, Bld: 113 mg/dL — ABNORMAL HIGH (ref 70–99)
Potassium: 3.6 mEq/L (ref 3.5–5.1)
Sodium: 133 mEq/L — ABNORMAL LOW (ref 135–145)

## 2019-11-25 LAB — HEPATIC FUNCTION PANEL
ALT: 20 U/L (ref 0–35)
AST: 20 U/L (ref 0–37)
Albumin: 3.9 g/dL (ref 3.5–5.2)
Alkaline Phosphatase: 47 U/L (ref 39–117)
Bilirubin, Direct: 0.2 mg/dL (ref 0.0–0.3)
Total Bilirubin: 0.8 mg/dL (ref 0.2–1.2)
Total Protein: 6.3 g/dL (ref 6.0–8.3)

## 2019-11-25 LAB — LIPID PANEL
Cholesterol: 186 mg/dL (ref 0–200)
HDL: 76.2 mg/dL (ref >39.00)
LDL Cholesterol: 90 mg/dL (ref 0–99)
NonHDL: 110.03
Total CHOL/HDL Ratio: 2
Triglycerides: 101 mg/dL (ref 0.0–149.0)
VLDL: 20.2 mg/dL (ref 0.0–40.0)

## 2019-11-25 LAB — TSH: TSH: 6.45 u[IU]/mL — ABNORMAL HIGH (ref 0.35–4.50)

## 2019-11-25 LAB — FERRITIN: Ferritin: 44.6 ng/mL (ref 10.0–291.0)

## 2019-11-26 ENCOUNTER — Telehealth: Payer: Self-pay

## 2019-11-26 ENCOUNTER — Other Ambulatory Visit: Payer: Self-pay | Admitting: Internal Medicine

## 2019-11-26 DIAGNOSIS — R739 Hyperglycemia, unspecified: Secondary | ICD-10-CM

## 2019-11-26 DIAGNOSIS — E871 Hypo-osmolality and hyponatremia: Secondary | ICD-10-CM

## 2019-11-26 DIAGNOSIS — E039 Hypothyroidism, unspecified: Secondary | ICD-10-CM

## 2019-11-26 NOTE — Telephone Encounter (Signed)
Left message to call back and get lab results

## 2019-11-26 NOTE — Telephone Encounter (Signed)
-----   Message from Einar Pheasant, MD sent at 11/26/2019  6:05 AM EDT ----- Notify pt that her cholesterol levels have improved. Her sugar is slightly elevated and sodium slightly decreased (just slight).  We will follow.  Would like to recheck fasting sugar and a1c in the next 4 weeks.  TSH elevated.  Confirm on synthroid 28mcg q day.  If so, then increase to 65mcg q day.  Will need new rx sent in.  Will need f/u tsh in 4 weeks.  Hgb, iron stores and liver function tests are wnl.

## 2019-11-26 NOTE — Progress Notes (Signed)
Order placed for f/u labs.  

## 2019-11-27 ENCOUNTER — Encounter: Payer: Medicare HMO | Admitting: Internal Medicine

## 2019-11-27 ENCOUNTER — Other Ambulatory Visit: Payer: Self-pay

## 2019-11-27 MED ORDER — LEVOTHYROXINE SODIUM 50 MCG PO TABS: ORAL_TABLET | ORAL | 1 refills | Status: DC

## 2019-11-28 ENCOUNTER — Encounter: Payer: Medicare HMO | Admitting: Internal Medicine

## 2019-12-06 ENCOUNTER — Encounter: Payer: Medicare HMO | Admitting: Internal Medicine

## 2019-12-10 ENCOUNTER — Ambulatory Visit (INDEPENDENT_AMBULATORY_CARE_PROVIDER_SITE_OTHER): Payer: Medicare HMO | Admitting: Internal Medicine

## 2019-12-10 ENCOUNTER — Encounter: Payer: Self-pay | Admitting: Internal Medicine

## 2019-12-10 ENCOUNTER — Other Ambulatory Visit: Payer: Self-pay

## 2019-12-10 VITALS — BP 130/70 | HR 90 | Temp 98.1°F | Resp 16 | Ht 63.0 in | Wt 205.0 lb

## 2019-12-10 DIAGNOSIS — D649 Anemia, unspecified: Secondary | ICD-10-CM | POA: Diagnosis not present

## 2019-12-10 DIAGNOSIS — E7800 Pure hypercholesterolemia, unspecified: Secondary | ICD-10-CM | POA: Diagnosis not present

## 2019-12-10 DIAGNOSIS — I10 Essential (primary) hypertension: Secondary | ICD-10-CM | POA: Diagnosis not present

## 2019-12-10 DIAGNOSIS — Z0000 Encounter for general adult medical examination without abnormal findings: Secondary | ICD-10-CM

## 2019-12-10 DIAGNOSIS — R739 Hyperglycemia, unspecified: Secondary | ICD-10-CM | POA: Diagnosis not present

## 2019-12-10 DIAGNOSIS — K219 Gastro-esophageal reflux disease without esophagitis: Secondary | ICD-10-CM | POA: Diagnosis not present

## 2019-12-10 DIAGNOSIS — E039 Hypothyroidism, unspecified: Secondary | ICD-10-CM | POA: Diagnosis not present

## 2019-12-10 DIAGNOSIS — F439 Reaction to severe stress, unspecified: Secondary | ICD-10-CM | POA: Diagnosis not present

## 2019-12-10 MED ORDER — SERTRALINE HCL 50 MG PO TABS: ORAL_TABLET | ORAL | 0 refills | Status: DC

## 2019-12-10 NOTE — Progress Notes (Signed)
Patient ID: Amanda Browning, female   DOB: 1946-03-24, 74 y.o.   MRN: [id number]   Subjective:    Patient ID: Amanda Browning, female    DOB: [birth year], 74 y.o.   MRN: [id number]  HPI This visit occurred during the SARS-CoV-2 public health emergency.  Safety protocols were in place, including screening questions prior to the visit, additional usage of staff PPE, and extensive cleaning of exam room while observing appropriate contact time as indicated for disinfecting solutions.  Patient here for her physical exam.  She reports increased stress.  Family stress.  Crying intermittently.  Discussed with her today.  Discussed treatment.  Tries to stay active. No chest pain or sob reported.  No acid reflux.  No abdominal pain or bowel change reported.      Past Medical History:  Diagnosis Date  . Anemia   . Chicken pox   . Colon polyps   . Complication of anesthesia    doesn't take much anesthesia to put pt to sleep, slow to wake up  . DDD (degenerative disc disease), cervical 02/05/13   C5-C6 and C6-C7  . GERD (gastroesophageal reflux disease)   . History of chicken pox   . Hypercholesterolemia   . Hyperlipidemia   . Hypertension   . Hypothyroidism   . Migraines   . PONV (postoperative nausea and vomiting)   . Squamous cell skin cancer    Past Surgical History:  Procedure Laterality Date  . BREAST BIOPSY Right 2012   stereotactic  . COLONOSCOPY WITH PROPOFOL N/A 02/23/2018   Procedure: COLONOSCOPY WITH PROPOFOL;  Surgeon: Manya Silvas, MD;  Location: [facility] ENDOSCOPY;  Service: Endoscopy;  Laterality: N/A;  . KNEE ARTHROPLASTY Right 04/17/2017   Procedure: COMPUTER ASSISTED TOTAL KNEE ARTHROPLASTY;  Surgeon: Dereck Leep, MD;  Location: [facility] ORS;  Service: Orthopedics;  Laterality: Right;  . SKIN BIOPSY    . VAGINAL HYSTERECTOMY  1994   ovaries not removed  . WISDOM TOOTH EXTRACTION     Family History  Problem Relation Age of Onset  . Heart disease Mother   . Stroke  Mother   . Hypertension Mother   . Heart attack Mother   . Heart disease Father        enlarged heart  . Stroke Father   . Hypertension Father   . Hyperlipidemia Father   . Heart attack Father   . Ovarian cancer Paternal Aunt   . Breast cancer Sister 86  . Cancer Sister        Breast Cancer  . Diabetes type II Brother   . Hypertension Brother   . Colon cancer Neg Hx   . Basal cell carcinoma Neg Hx   . Melanoma Neg Hx   . Squamous cell carcinoma Neg Hx    Social History   Socioeconomic History  . Marital status: Divorced    Spouse name: Not on file  . Number of children: 2  . Years of education: 69  . Highest education level: High school graduate  Occupational History  . Not on file  Tobacco Use  . Smoking status: Never Smoker  . Smokeless tobacco: Never Used  Vaping Use  . Vaping Use: Never used  Substance and Sexual Activity  . Alcohol use: No    Alcohol/week: 0.0 standard drinks  . Drug use: No  . Sexual activity: Never  Other Topics Concern  . Not on file  Social History Narrative   Full Code   Alcohol - none  Never smoker and no smokeless tobacco   2 daughters   Social Determinants of Health   Financial Resource Strain:   . Difficulty of Paying Living Expenses: Not on file  Food Insecurity:   . Worried About Charity fundraiser in the Last Year: Not on file  . Ran Out of Food in the Last Year: Not on file  Transportation Needs:   . Lack of Transportation (Medical): Not on file  . Lack of Transportation (Non-Medical): Not on file  Physical Activity:   . Days of Exercise per Week: Not on file  . Minutes of Exercise per Session: Not on file  Stress:   . Feeling of Stress : Not on file  Social Connections:   . Frequency of Communication with Friends and Family: Not on file  . Frequency of Social Gatherings with Friends and Family: Not on file  . Attends Religious Services: Not on file  . Active Member of Clubs or Organizations: Not on file  .  Attends Archivist Meetings: Not on file  . Marital Status: Not on file    Outpatient Encounter Medications as of 12/10/2019  Medication Sig  . acetaminophen (TYLENOL) 325 MG tablet Take 650 mg by mouth 4 (four) times daily as needed.  Marland Kitchen aspirin EC 81 MG tablet Take by mouth.  . cetirizine (ZYRTEC) 10 MG tablet Take 10 mg by mouth daily.  . ferrous sulfate 325 (65 FE) MG tablet Take by mouth.  Marland Kitchen glucosamine-chondroitin 500-400 MG tablet Take 1 tablet by mouth daily.  Marland Kitchen levothyroxine (SYNTHROID) 50 MCG tablet TAKE 1 TABLET EVERY DAY BEFORE BREAKFAST  . lisinopril-hydrochlorothiazide (ZESTORETIC) 20-25 MG tablet TAKE 1 TABLET EVERY DAY  . lovastatin (MEVACOR) 40 MG tablet TAKE 1 TABLET AT BEDTIME  . magnesium oxide (MAG-OX) 400 MG tablet Take 1 tablet (400 mg total) by mouth 2 (two) times daily.  . Multiple Vitamin (MULTIVITAMIN) tablet Take 1 tablet by mouth daily.  Marland Kitchen omeprazole (PRILOSEC) 20 MG capsule TAKE 1 CAPSULE TWICE DAILY BEFORE MEALS  . vitamin B-12 (CYANOCOBALAMIN) 1000 MCG tablet Take 1 tablet by mouth daily.  . [DISCONTINUED] sertraline (ZOLOFT) 50 MG tablet Take 1/2 tablet x 1 week and then one po q day.   No facility-administered encounter medications on file as of 12/10/2019.    Review of Systems  Constitutional: Negative for appetite change and unexpected weight change.  HENT: Negative for congestion and sinus pressure.   Eyes: Negative for pain and visual disturbance.  Respiratory: Negative for cough, chest tightness and shortness of breath.   Cardiovascular: Negative for chest pain, palpitations and leg swelling.  Gastrointestinal: Negative for abdominal pain, diarrhea, nausea and vomiting.  Genitourinary: Negative for difficulty urinating and dysuria.  Musculoskeletal: Negative for joint swelling and myalgias.  Skin: Negative for color change and rash.  Neurological: Negative for dizziness, light-headedness and headaches.  Hematological: Negative for  adenopathy. Does not bruise/bleed easily.  Psychiatric/Behavioral: Negative for agitation and dysphoric mood.       Increased stress as outlined.         Objective:    Physical Exam Vitals reviewed.  Constitutional:      General: She is not in acute distress.    Appearance: Normal appearance. She is well-developed.  HENT:     Head: Normocephalic and atraumatic.     Right Ear: External ear normal.     Left Ear: External ear normal.  Eyes:     General: No scleral icterus.  Right eye: No discharge.        Left eye: No discharge.     Conjunctiva/sclera: Conjunctivae normal.  Neck:     Thyroid: No thyromegaly.  Cardiovascular:     Rate and Rhythm: Normal rate and regular rhythm.  Pulmonary:     Effort: No tachypnea, accessory muscle usage or respiratory distress.     Breath sounds: Normal breath sounds. No decreased breath sounds or wheezing.  Chest:     Breasts:        Right: No inverted nipple, mass, nipple discharge or tenderness (no axillary adenopathy).        Left: No inverted nipple, mass, nipple discharge or tenderness (no axilarry adenopathy).  Abdominal:     General: Bowel sounds are normal.     Palpations: Abdomen is soft.     Tenderness: There is no abdominal tenderness.  Musculoskeletal:        General: No swelling or tenderness.     Cervical back: Neck supple. No tenderness.  Lymphadenopathy:     Cervical: No cervical adenopathy.  Skin:    Findings: No erythema or rash.  Neurological:     Mental Status: She is alert and oriented to person, place, and time.  Psychiatric:        Mood and Affect: Mood normal.        Behavior: Behavior normal.     BP 130/70   Pulse 90   Temp 98.1 F (36.7 C) (Oral)   Resp 16   Ht _0  (1.6 m)   Wt 205 lb (93 kg)   SpO2 97%   BMI 36.31 kg/m  Wt Readings from Last 3 Encounters:  12/10/19 205 lb (93 kg)  07/29/19 213 lb 12.8 oz (97 kg)  02/12/19 211 lb 12.8 oz (96.1 kg)     Lab Results  Component Value  Date   WBC 4.8 11/25/2019   HGB 13.7 11/25/2019   HCT 40.2 11/25/2019   PLT 183.0 11/25/2019   GLUCOSE 113 (H) 11/25/2019   CHOL 186 11/25/2019   TRIG 101.0 11/25/2019   HDL 76.20 11/25/2019   LDLDIRECT 131.0 03/19/2013   LDLCALC 90 11/25/2019   ALT 20 11/25/2019   AST 20 11/25/2019   NA 133 (L) 11/25/2019   K 3.6 11/25/2019   CL 96 11/25/2019   CREATININE 0.90 11/25/2019   BUN 7 11/25/2019   CO2 21 11/25/2019   TSH 6.45 (H) 11/25/2019   INR 0.99 04/05/2017    MM 3D SCREEN BREAST BILATERAL  Result Date: 04/10/2019 CLINICAL DATA:  Screening. EXAM: DIGITAL SCREENING BILATERAL MAMMOGRAM WITH TOMO AND CAD COMPARISON:  Previous exam(s). ACR Breast Density Category b: There are scattered areas of fibroglandular density. FINDINGS: There are no findings suspicious for malignancy. Images were processed with CAD. IMPRESSION: No mammographic evidence of malignancy. A result letter of this screening mammogram will be mailed directly to the patient. RECOMMENDATION: Screening mammogram in one year. (Code:SM-B-01Y) BI-RADS CATEGORY  1: Negative. Electronically Signed   By: Lajean Manes M.D.   On: 04/10/2019 12:53       Assessment & Plan:   Problem List Items Addressed This Visit    Stress    Increased stress as outlined.  Discussed with her today.  Discussed zoloft - as directed.  Follow.        Hypothyroidism    On thyroid replacement.  Follow tsh.       Hypertension    Blood pressure as outlined.  On lisinopril/hctz.  Continue  current medication regimen.  Follow pressures.  Follow metabolic panel.        Hyperglycemia    Low carb diet and exercise.  Follow met b and a1c.       Hypercholesterolemia    On lovastatin.  Low cholesterol diet and exercise.  Follow lipid panel and liver function tests.        Health care maintenance    Physical today 12/10/19.  Mammogram 04/10/19 - birads I.  Colonoscopy 02/2018.  Recommended f/u colonoscopy in 5 years.        GERD  (gastroesophageal reflux disease)    No upper symptoms reported.  On omeprazole.        Anemia    Follow cbc.       Relevant Medications   ferrous sulfate 325 (65 FE) MG tablet       Einar Pheasant, MD

## 2019-12-10 NOTE — Assessment & Plan Note (Signed)
Physical today 12/10/19.  Mammogram 04/10/19 - birads I.  Colonoscopy 02/2018.  Recommended f/u colonoscopy in 5 years.

## 2019-12-16 ENCOUNTER — Telehealth: Payer: Self-pay | Admitting: Internal Medicine

## 2019-12-16 ENCOUNTER — Encounter: Payer: Self-pay | Admitting: Internal Medicine

## 2019-12-16 DIAGNOSIS — R7303 Prediabetes: Secondary | ICD-10-CM | POA: Insufficient documentation

## 2019-12-16 DIAGNOSIS — R739 Hyperglycemia, unspecified: Secondary | ICD-10-CM | POA: Insufficient documentation

## 2019-12-16 MED ORDER — SERTRALINE HCL 50 MG PO TABS: ORAL_TABLET | ORAL | 1 refills | Status: DC

## 2019-12-16 NOTE — Assessment & Plan Note (Signed)
Low carb diet and exercise.  Follow met b and a1c.  

## 2019-12-16 NOTE — Assessment & Plan Note (Signed)
On thyroid replacement.  Follow tsh.  

## 2019-12-16 NOTE — Assessment & Plan Note (Signed)
Follow cbc.  

## 2019-12-16 NOTE — Assessment & Plan Note (Signed)
On lovastatin.  Low cholesterol diet and exercise.  Follow lipid panel and liver function tests.   

## 2019-12-16 NOTE — Assessment & Plan Note (Signed)
Blood pressure as outlined.  On lisinopril/hctz.  Continue current medication regimen.  Follow pressures.  Follow metabolic panel.

## 2019-12-16 NOTE — Telephone Encounter (Signed)
Patient called and said that her mail pharmacy [facility] needs the sertraline (ZOLOFT) 50 MG tablet re-sent in.

## 2019-12-16 NOTE — Assessment & Plan Note (Signed)
No upper symptoms reported.  On omeprazole.  

## 2019-12-16 NOTE — Assessment & Plan Note (Signed)
Increased stress as outlined.  Discussed with her today.  Discussed zoloft - as directed.  Follow.

## 2020-01-07 ENCOUNTER — Other Ambulatory Visit: Payer: Self-pay

## 2020-01-07 ENCOUNTER — Other Ambulatory Visit (INDEPENDENT_AMBULATORY_CARE_PROVIDER_SITE_OTHER): Payer: Medicare HMO

## 2020-01-07 DIAGNOSIS — E039 Hypothyroidism, unspecified: Secondary | ICD-10-CM | POA: Diagnosis not present

## 2020-01-07 DIAGNOSIS — Z23 Encounter for immunization: Secondary | ICD-10-CM

## 2020-01-07 DIAGNOSIS — R739 Hyperglycemia, unspecified: Secondary | ICD-10-CM

## 2020-01-07 DIAGNOSIS — E871 Hypo-osmolality and hyponatremia: Secondary | ICD-10-CM

## 2020-01-07 LAB — BASIC METABOLIC PANEL
BUN: 11 mg/dL (ref 6–23)
CO2: 29 mEq/L (ref 19–32)
Calcium: 9.1 mg/dL (ref 8.4–10.5)
Chloride: 98 mEq/L (ref 96–112)
Creatinine, Ser: 0.91 mg/dL (ref 0.40–1.20)
GFR: 60.43 mL/min (ref >60.00)
Glucose, Bld: 86 mg/dL (ref 70–99)
Potassium: 3.9 mEq/L (ref 3.5–5.1)
Sodium: 135 mEq/L (ref 135–145)

## 2020-01-07 LAB — HEMOGLOBIN A1C: Hgb A1c MFr Bld: 5.3 % (ref 4.6–6.5)

## 2020-01-07 LAB — TSH: TSH: 1.52 u[IU]/mL (ref 0.35–4.50)

## 2020-01-21 DIAGNOSIS — H524 Presbyopia: Secondary | ICD-10-CM | POA: Diagnosis not present

## 2020-01-21 DIAGNOSIS — H52223 Regular astigmatism, bilateral: Secondary | ICD-10-CM | POA: Diagnosis not present

## 2020-01-21 DIAGNOSIS — H5203 Hypermetropia, bilateral: Secondary | ICD-10-CM | POA: Diagnosis not present

## 2020-01-21 DIAGNOSIS — H2513 Age-related nuclear cataract, bilateral: Secondary | ICD-10-CM | POA: Diagnosis not present

## 2020-01-21 DIAGNOSIS — H43813 Vitreous degeneration, bilateral: Secondary | ICD-10-CM | POA: Diagnosis not present

## 2020-01-22 ENCOUNTER — Other Ambulatory Visit: Payer: Self-pay | Admitting: Internal Medicine

## 2020-03-10 ENCOUNTER — Telehealth: Payer: Self-pay | Admitting: Internal Medicine

## 2020-03-10 ENCOUNTER — Other Ambulatory Visit: Payer: Self-pay

## 2020-03-10 ENCOUNTER — Other Ambulatory Visit: Payer: Self-pay | Admitting: Internal Medicine

## 2020-03-10 DIAGNOSIS — Z1231 Encounter for screening mammogram for malignant neoplasm of breast: Secondary | ICD-10-CM

## 2020-03-10 MED ORDER — LEVOTHYROXINE SODIUM 50 MCG PO TABS: ORAL_TABLET | ORAL | 1 refills | Status: DC

## 2020-03-10 NOTE — Telephone Encounter (Signed)
Levothyroxine and mammo ordered. Pt aware.

## 2020-03-10 NOTE — Telephone Encounter (Signed)
Pt wanted to know if Dr.Scott wanted her to continue taking the LEVOTHYROXINE SODIUM 50 MCG if so she needs a new rx called in they only have the 1mcg on file at [facility] also needs an order placed for her mammogram to [facility]

## 2020-03-19 ENCOUNTER — Encounter: Payer: Self-pay | Admitting: Internal Medicine

## 2020-03-19 ENCOUNTER — Other Ambulatory Visit: Payer: Self-pay

## 2020-03-19 ENCOUNTER — Ambulatory Visit (INDEPENDENT_AMBULATORY_CARE_PROVIDER_SITE_OTHER): Payer: Medicare HMO | Admitting: Internal Medicine

## 2020-03-19 DIAGNOSIS — M1711 Unilateral primary osteoarthritis, right knee: Secondary | ICD-10-CM

## 2020-03-19 DIAGNOSIS — D649 Anemia, unspecified: Secondary | ICD-10-CM

## 2020-03-19 DIAGNOSIS — E7800 Pure hypercholesterolemia, unspecified: Secondary | ICD-10-CM | POA: Diagnosis not present

## 2020-03-19 DIAGNOSIS — R739 Hyperglycemia, unspecified: Secondary | ICD-10-CM | POA: Diagnosis not present

## 2020-03-19 DIAGNOSIS — E039 Hypothyroidism, unspecified: Secondary | ICD-10-CM

## 2020-03-19 DIAGNOSIS — I10 Essential (primary) hypertension: Secondary | ICD-10-CM

## 2020-03-19 DIAGNOSIS — F439 Reaction to severe stress, unspecified: Secondary | ICD-10-CM | POA: Diagnosis not present

## 2020-03-19 DIAGNOSIS — K219 Gastro-esophageal reflux disease without esophagitis: Secondary | ICD-10-CM | POA: Diagnosis not present

## 2020-03-19 NOTE — Progress Notes (Signed)
Patient ID: Amanda Browning, female   DOB: [birth year], 74 y.o.   MRN: [id number]   Subjective:    Patient ID: Amanda Browning, female    DOB: September 16, 1945, 74 y.o.   MRN: [id number]  HPI This visit occurred during the SARS-CoV-2 public health emergency.  Safety protocols were in place, including screening questions prior to the visit, additional usage of staff PPE, and extensive cleaning of exam room while observing appropriate contact time as indicated for disinfecting solutions.  Patient here for a scheduled follow up. Here to follow up regarding increased stress.  She is experiencing increased stress. Family issue.  Discussed with her today.  She started zoloft last week.  Is helping.  Sleeping better.  Handling things better.  Plans to remain on 42m.  Trying to stay active.  No chest pain or sob.  No acid reflux or abdominal pain reported.  Bowels movng.     Past Medical History:  Diagnosis Date  . Anemia   . Chicken pox   . Colon polyps   . Complication of anesthesia    doesn't take much anesthesia to put pt to sleep, slow to wake up  . DDD (degenerative disc disease), cervical 02/05/13   C5-C6 and C6-C7  . GERD (gastroesophageal reflux disease)   . History of chicken pox   . Hypercholesterolemia   . Hyperlipidemia   . Hypertension   . Hypothyroidism   . Migraines   . PONV (postoperative nausea and vomiting)   . Squamous cell skin cancer    Past Surgical History:  Procedure Laterality Date  . BREAST BIOPSY Right 2012   stereotactic  . COLONOSCOPY WITH PROPOFOL N/A 02/23/2018   Procedure: COLONOSCOPY WITH PROPOFOL;  Surgeon: EManya Silvas MD;  [facility];  Service: Endoscopy;  Laterality: N/A;  . KNEE ARTHROPLASTY Right 04/17/2017   Procedure: COMPUTER ASSISTED TOTAL KNEE ARTHROPLASTY;  Surgeon: HDereck Leep MD;  Location: [facility] ORS;  Service: Orthopedics;  Laterality: Right;  . SKIN BIOPSY    . VAGINAL HYSTERECTOMY  1994   ovaries not removed  .  WISDOM TOOTH EXTRACTION     Family History  Problem Relation Age of Onset  . Heart disease Mother   . Stroke Mother   . Hypertension Mother   . Heart attack Mother   . Heart disease Father        enlarged heart  . Stroke Father   . Hypertension Father   . Hyperlipidemia Father   . Heart attack Father   . Ovarian cancer Paternal Aunt   . Breast cancer Sister [id number] . Cancer Sister        Breast Cancer  . Diabetes type II Brother   . Hypertension Brother   . Colon cancer Neg Hx   . Basal cell carcinoma Neg Hx   . Melanoma Neg Hx   . Squamous cell carcinoma Neg Hx    Social History   Socioeconomic History  . Marital status: Divorced    Spouse name: Not on file  . Number of children: 2  . Years of education: 155 . Highest education level: High school graduate  Occupational History  . Not on file  Tobacco Use  . Smoking status: Never Smoker  . Smokeless tobacco: Never Used  Vaping Use  . Vaping Use: Never used  Substance and Sexual Activity  . Alcohol use: No    Alcohol/week: 0.0 standard drinks  . Drug use: No  . Sexual activity: Never  Other Topics Concern  . Not on file  Social History Narrative   Full Code   Alcohol - none   Never smoker and no smokeless tobacco   2 daughters   Social Determinants of Health   Financial Resource Strain:   . Difficulty of Paying Living Expenses: Not on file  Food Insecurity:   . Worried About Charity fundraiser in the Last Year: Not on file  . Ran Out of Food in the Last Year: Not on file  Transportation Needs:   . Lack of Transportation (Medical): Not on file  . Lack of Transportation (Non-Medical): Not on file  Physical Activity:   . Days of Exercise per Week: Not on file  . Minutes of Exercise per Session: Not on file  Stress:   . Feeling of Stress : Not on file  Social Connections:   . Frequency of Communication with Friends and Family: Not on file  . Frequency of Social Gatherings with Friends and Family: Not on  file  . Attends Religious Services: Not on file  . Active Member of Clubs or Organizations: Not on file  . Attends Archivist Meetings: Not on file  . Marital Status: Not on file    Outpatient Encounter Medications as of 03/19/2020  Medication Sig  . acetaminophen (TYLENOL) 325 MG tablet Take 650 mg by mouth 4 (four) times daily as needed.  Marland Kitchen aspirin EC 81 MG tablet Take by mouth.  . cetirizine (ZYRTEC) 10 MG tablet Take 10 mg by mouth daily.  . ferrous sulfate 325 (65 FE) MG tablet Take by mouth.  Marland Kitchen glucosamine-chondroitin 500-400 MG tablet Take 1 tablet by mouth daily.  Marland Kitchen levothyroxine (SYNTHROID) 50 MCG tablet TAKE 1 TABLET EVERY DAY BEFORE BREAKFAST  . lisinopril-hydrochlorothiazide (ZESTORETIC) 20-25 MG tablet TAKE 1 TABLET EVERY DAY  . lovastatin (MEVACOR) 40 MG tablet TAKE 1 TABLET AT BEDTIME  . magnesium oxide (MAG-OX) 400 MG tablet Take 1 tablet (400 mg total) by mouth 2 (two) times daily.  . Multiple Vitamin (MULTIVITAMIN) tablet Take 1 tablet by mouth daily.  Marland Kitchen omeprazole (PRILOSEC) 20 MG capsule TAKE 1 CAPSULE TWICE DAILY BEFORE MEALS  . sertraline (ZOLOFT) 50 MG tablet TAKE 1/2 TABLET DAILY FOR 1 WEEK, THEN TAKE 1 TABLET DAILY  . vitamin B-12 (CYANOCOBALAMIN) 1000 MCG tablet Take 1 tablet by mouth daily.   No facility-administered encounter medications on file as of 03/19/2020.    Review of Systems  Constitutional: Negative for appetite change and unexpected weight change.  HENT: Negative for congestion and sinus pressure.   Respiratory: Negative for cough, chest tightness and shortness of breath.   Cardiovascular: Negative for chest pain, palpitations and leg swelling.  Gastrointestinal: Negative for abdominal pain, diarrhea, nausea and vomiting.  Genitourinary: Negative for difficulty urinating and dysuria.  Musculoskeletal: Negative for joint swelling and myalgias.  Skin: Negative for color change and rash.  Neurological: Negative for dizziness,  light-headedness and headaches.  Psychiatric/Behavioral: Negative for agitation and dysphoric mood.       Increased stress as outlined.         Objective:    Physical Exam Vitals reviewed.  Constitutional:      General: She is not in acute distress.    Appearance: Normal appearance.  HENT:     Head: Normocephalic and atraumatic.     Right Ear: External ear normal.     Left Ear: External ear normal.  Eyes:     General: No scleral icterus.  Right eye: No discharge.        Left eye: No discharge.     Conjunctiva/sclera: Conjunctivae normal.  Neck:     Thyroid: No thyromegaly.  Cardiovascular:     Rate and Rhythm: Normal rate and regular rhythm.  Pulmonary:     Effort: No respiratory distress.     Breath sounds: Normal breath sounds. No wheezing.  Abdominal:     General: Bowel sounds are normal.     Palpations: Abdomen is soft.     Tenderness: There is no abdominal tenderness.  Musculoskeletal:        General: No swelling or tenderness.     Cervical back: Neck supple. No tenderness.  Lymphadenopathy:     Cervical: No cervical adenopathy.  Skin:    Findings: No erythema or rash.  Neurological:     Mental Status: She is alert.  Psychiatric:        Mood and Affect: Mood normal.        Behavior: Behavior normal.     BP 120/74   Pulse 72   Temp 98.3 F (36.8 C) (Oral)   Resp 16   Ht 5' 3"  (1.6 m)   Wt 204 lb (92.5 kg)   SpO2 98%   BMI 36.14 kg/m  Wt Readings from Last 3 Encounters:  03/19/20 204 lb (92.5 kg)  12/10/19 205 lb (93 kg)  07/29/19 213 lb 12.8 oz (97 kg)     Lab Results  Component Value Date   WBC 4.8 11/25/2019   HGB 13.7 11/25/2019   HCT 40.2 11/25/2019   PLT 183.0 11/25/2019   GLUCOSE 86 01/07/2020   CHOL 186 11/25/2019   TRIG 101.0 11/25/2019   HDL 76.20 11/25/2019   LDLDIRECT 131.0 03/19/2013   LDLCALC 90 11/25/2019   ALT 20 11/25/2019   AST 20 11/25/2019   NA 135 01/07/2020   K 3.9 01/07/2020   CL 98 01/07/2020    CREATININE 0.91 01/07/2020   BUN 11 01/07/2020   CO2 29 01/07/2020   TSH 1.52 01/07/2020   INR 0.99 04/05/2017   HGBA1C 5.3 01/07/2020    MM 3D SCREEN BREAST BILATERAL  Result Date: 04/10/2019 CLINICAL DATA:  Screening. EXAM: DIGITAL SCREENING BILATERAL MAMMOGRAM WITH TOMO AND CAD COMPARISON:  Previous exam(s). ACR Breast Density Category b: There are scattered areas of fibroglandular density. FINDINGS: There are no findings suspicious for malignancy. Images were processed with CAD. IMPRESSION: No mammographic evidence of malignancy. A result letter of this screening mammogram will be mailed directly to the patient. RECOMMENDATION: Screening mammogram in one year. (Code:SM-B-01Y) BI-RADS CATEGORY  1: Negative. Electronically Signed   By: Lajean Manes M.D.   On: 04/10/2019 12:53       Assessment & Plan:   Problem List Items Addressed This Visit    Stress    Increased stress as outlined.  Started on zoloft.  Doing better.  Continue 89m dose.  Follow.       Primary osteoarthritis of right knee    S/p surgery.  Stable.       Hypothyroidism    On thyroid replacement.  Follow tsh.       Relevant Orders   TSH   Hypertension    Blood pressure as outlined.  Continue lisinopril/hctz.  Follow pressures.  Follow metabolic panel.       Hyperglycemia    Low carb diet and exercise.  Follow met b and a1c.        Relevant Orders  Hemoglobin A1c   Hypercholesterolemia    On lovastatin.  Low cholesterol diet and exercise.  Follow lipid panel and liver function tests.        Relevant Orders   Hepatic function panel   Lipid panel   Basic metabolic panel   GERD (gastroesophageal reflux disease)    No upper symptoms.  On omeprazole.       Anemia    Follow cbc.           Einar Pheasant, MD

## 2020-03-22 ENCOUNTER — Encounter: Payer: Self-pay | Admitting: Internal Medicine

## 2020-03-22 NOTE — Assessment & Plan Note (Signed)
Follow cbc.  

## 2020-03-22 NOTE — Assessment & Plan Note (Signed)
No upper symptoms.  On omeprazole.  

## 2020-03-22 NOTE — Assessment & Plan Note (Signed)
Increased stress as outlined.  Started on zoloft.  Doing better.  Continue 25mg  dose.  Follow.

## 2020-03-22 NOTE — Assessment & Plan Note (Signed)
On lovastatin.  Low cholesterol diet and exercise.  Follow lipid panel and liver function tests.   

## 2020-03-22 NOTE — Assessment & Plan Note (Signed)
On thyroid replacement.  Follow tsh.  

## 2020-03-22 NOTE — Assessment & Plan Note (Signed)
Blood pressure as outlined.  Continue lisinopril/hctz.  Follow pressures.  Follow metabolic panel.

## 2020-03-22 NOTE — Assessment & Plan Note (Signed)
Low carb diet and exercise.  Follow met b and a1c.   

## 2020-03-22 NOTE — Assessment & Plan Note (Signed)
S/p surgery.  Stable.

## 2020-03-30 ENCOUNTER — Other Ambulatory Visit: Payer: Self-pay | Admitting: Internal Medicine

## 2020-04-02 ENCOUNTER — Other Ambulatory Visit: Payer: Self-pay | Admitting: Internal Medicine

## 2020-04-13 ENCOUNTER — Other Ambulatory Visit: Payer: Self-pay

## 2020-04-13 ENCOUNTER — Ambulatory Visit
Admission: RE | Admit: 2020-04-13 | Discharge: 2020-04-13 | Disposition: A | Discharge status: Home / Self Care | Payer: Medicare HMO | Source: Ambulatory Visit | Attending: Internal Medicine | Admitting: Internal Medicine

## 2020-04-13 DIAGNOSIS — Z1231 Encounter for screening mammogram for malignant neoplasm of breast: Secondary | ICD-10-CM | POA: Insufficient documentation

## 2020-06-15 ENCOUNTER — Other Ambulatory Visit: Payer: Self-pay | Admitting: Internal Medicine

## 2020-07-06 ENCOUNTER — Other Ambulatory Visit: Payer: Self-pay | Admitting: Internal Medicine

## 2020-07-16 ENCOUNTER — Other Ambulatory Visit: Payer: Self-pay

## 2020-07-16 ENCOUNTER — Other Ambulatory Visit: Payer: Medicare HMO

## 2020-07-16 ENCOUNTER — Ambulatory Visit (INDEPENDENT_AMBULATORY_CARE_PROVIDER_SITE_OTHER): Payer: Medicare HMO | Admitting: Internal Medicine

## 2020-07-16 VITALS — BP 138/78 | HR 70 | Temp 97.5°F | Resp 16 | Ht 63.0 in | Wt 209.0 lb

## 2020-07-16 DIAGNOSIS — I10 Essential (primary) hypertension: Secondary | ICD-10-CM | POA: Diagnosis not present

## 2020-07-16 DIAGNOSIS — Z1159 Encounter for screening for other viral diseases: Secondary | ICD-10-CM

## 2020-07-16 DIAGNOSIS — E039 Hypothyroidism, unspecified: Secondary | ICD-10-CM | POA: Diagnosis not present

## 2020-07-16 DIAGNOSIS — R739 Hyperglycemia, unspecified: Secondary | ICD-10-CM

## 2020-07-16 DIAGNOSIS — D649 Anemia, unspecified: Secondary | ICD-10-CM | POA: Diagnosis not present

## 2020-07-16 DIAGNOSIS — K219 Gastro-esophageal reflux disease without esophagitis: Secondary | ICD-10-CM | POA: Diagnosis not present

## 2020-07-16 DIAGNOSIS — F439 Reaction to severe stress, unspecified: Secondary | ICD-10-CM | POA: Diagnosis not present

## 2020-07-16 DIAGNOSIS — E7800 Pure hypercholesterolemia, unspecified: Secondary | ICD-10-CM

## 2020-07-16 LAB — LIPID PANEL
Cholesterol: 209 mg/dL — ABNORMAL HIGH (ref 0–200)
HDL: 76.4 mg/dL (ref >39.00)
LDL Cholesterol: 114 mg/dL — ABNORMAL HIGH (ref 0–99)
NonHDL: 132.18
Total CHOL/HDL Ratio: 3
Triglycerides: 89 mg/dL (ref 0.0–149.0)
VLDL: 17.8 mg/dL (ref 0.0–40.0)

## 2020-07-16 LAB — TSH: TSH: 1.85 u[IU]/mL (ref 0.35–4.50)

## 2020-07-16 LAB — HEPATIC FUNCTION PANEL
ALT: 17 U/L (ref 0–35)
AST: 20 U/L (ref 0–37)
Albumin: 4 g/dL (ref 3.5–5.2)
Alkaline Phosphatase: 68 U/L (ref 39–117)
Bilirubin, Direct: 0.2 mg/dL (ref 0.0–0.3)
Total Bilirubin: 0.8 mg/dL (ref 0.2–1.2)
Total Protein: 6.4 g/dL (ref 6.0–8.3)

## 2020-07-16 LAB — BASIC METABOLIC PANEL
BUN: 11 mg/dL (ref 6–23)
CO2: 32 mEq/L (ref 19–32)
Calcium: 9.3 mg/dL (ref 8.4–10.5)
Chloride: 98 mEq/L (ref 96–112)
Creatinine, Ser: 0.75 mg/dL (ref 0.40–1.20)
GFR: 78.36 mL/min (ref >60.00)
Glucose, Bld: 92 mg/dL (ref 70–99)
Potassium: 3.8 mEq/L (ref 3.5–5.1)
Sodium: 137 mEq/L (ref 135–145)

## 2020-07-16 LAB — HEMOGLOBIN A1C: Hgb A1c MFr Bld: 5.8 % (ref 4.6–6.5)

## 2020-07-16 MED ORDER — AMLODIPINE BESYLATE 2.5 MG PO TABS: 2.5000 mg | ORAL_TABLET | Freq: Every day | ORAL | 3 refills | Status: DC

## 2020-07-16 NOTE — Progress Notes (Incomplete)
Patient ID: LENIX BENOIST, female   DOB: [birth year], 75 y.o.   MRN: [id number]   Subjective:    Patient ID: JUHI LAGRANGE, female    DOB: 1945/07/17, 75 y.o.   MRN: [id number]  HPI This visit occurred during the SARS-CoV-2 public health emergency.  Safety protocols were in place, including screening questions prior to the visit, additional usage of staff PPE, and extensive cleaning of exam room while observing appropriate contact time as indicated for disinfecting solutions.  Patient here for a scheduled follow up.    Past Medical History:  Diagnosis Date  . Anemia   . Chicken pox   . Colon polyps   . Complication of anesthesia    doesn't take much anesthesia to put pt to sleep, slow to wake up  . DDD (degenerative disc disease), cervical 02/05/13   C5-C6 and C6-C7  . GERD (gastroesophageal reflux disease)   . History of chicken pox   . Hypercholesterolemia   . Hyperlipidemia   . Hypertension   . Hypothyroidism   . Migraines   . PONV (postoperative nausea and vomiting)   . Squamous cell skin cancer    Past Surgical History:  Procedure Laterality Date  . BREAST BIOPSY Right 2012   stereotactic  . COLONOSCOPY WITH PROPOFOL N/A 02/23/2018   Procedure: COLONOSCOPY WITH PROPOFOL;  Surgeon: Manya Silvas, MD;  Location: [facility] ENDOSCOPY;  Service: Endoscopy;  Laterality: N/A;  . KNEE ARTHROPLASTY Right 04/17/2017   Procedure: COMPUTER ASSISTED TOTAL KNEE ARTHROPLASTY;  Surgeon: Dereck Leep, MD;  Location: [facility] ORS;  Service: Orthopedics;  Laterality: Right;  . SKIN BIOPSY    . VAGINAL HYSTERECTOMY  1994   ovaries not removed  . WISDOM TOOTH EXTRACTION     Family History  Problem Relation Age of Onset  . Heart disease Mother   . Stroke Mother   . Hypertension Mother   . Heart attack Mother   . Heart disease Father        enlarged heart  . Stroke Father   . Hypertension Father   . Hyperlipidemia Father   . Heart attack Father   . Ovarian cancer Paternal Aunt    . Breast cancer Sister 58  . Cancer Sister        Breast Cancer  . Diabetes type II Brother   . Hypertension Brother   . Colon cancer Neg Hx   . Basal cell carcinoma Neg Hx   . Melanoma Neg Hx   . Squamous cell carcinoma Neg Hx    Social History   Socioeconomic History  . Marital status: Divorced    Spouse name: Not on file  . Number of children: 2  . Years of education: 22  . Highest education level: High school graduate  Occupational History  . Not on file  Tobacco Use  . Smoking status: Never Smoker  . Smokeless tobacco: Never Used  Vaping Use  . Vaping Use: Never used  Substance and Sexual Activity  . Alcohol use: No    Alcohol/week: 0.0 standard drinks  . Drug use: No  . Sexual activity: Never  Other Topics Concern  . Not on file  Social History Narrative   Full Code   Alcohol - none   Never smoker and no smokeless tobacco   2 daughters   Social Determinants of Health   Financial Resource Strain: Not on file  Food Insecurity: Not on file  Transportation Needs: Not on file  Physical Activity: Not on file  Stress: Not on file  Social Connections: Not on file    Outpatient Encounter Medications as of 07/16/2020  Medication Sig  . acetaminophen (TYLENOL) 325 MG tablet Take 650 mg by mouth 4 (four) times daily as needed.  Marland Kitchen aspirin EC 81 MG tablet Take by mouth.  . cetirizine (ZYRTEC) 10 MG tablet Take 10 mg by mouth daily.  . ferrous sulfate 325 (65 FE) MG tablet Take by mouth.  Marland Kitchen glucosamine-chondroitin 500-400 MG tablet Take 1 tablet by mouth daily.  Marland Kitchen levothyroxine (SYNTHROID) 50 MCG tablet TAKE 1 TABLET EVERY DAY BEFORE BREAKFAST  . lisinopril-hydrochlorothiazide (ZESTORETIC) 20-25 MG tablet TAKE 1 TABLET EVERY DAY  . lovastatin (MEVACOR) 40 MG tablet TAKE 1 TABLET AT BEDTIME  . magnesium oxide (MAG-OX) 400 MG tablet Take 1 tablet (400 mg total) by mouth 2 (two) times daily.  . Multiple Vitamin (MULTIVITAMIN) tablet Take 1 tablet by mouth daily.  Marland Kitchen  omeprazole (PRILOSEC) 20 MG capsule TAKE 1 CAPSULE TWICE DAILY BEFORE MEALS  . sertraline (ZOLOFT) 50 MG tablet TAKE 1/2 TABLET DAILY FOR 1 WEEK, THEN TAKE 1 TABLET DAILY  . vitamin B-12 (CYANOCOBALAMIN) 1000 MCG tablet Take 1 tablet by mouth daily.   No facility-administered encounter medications on file as of 07/16/2020.    Review of Systems     Objective:    Physical Exam  BP 138/78   Pulse 70   Temp (!) 97.5 F (36.4 C) (Oral)   Resp 16   Ht 5\' 3"  (1.6 m)   Wt 209 lb (94.8 kg)   SpO2 98%   BMI 37.02 kg/m  Wt Readings from Last 3 Encounters:  07/16/20 209 lb (94.8 kg)  03/19/20 204 lb (92.5 kg)  12/10/19 205 lb (93 kg)     Lab Results  Component Value Date   WBC 4.8 11/25/2019   HGB 13.7 11/25/2019   HCT 40.2 11/25/2019   PLT 183.0 11/25/2019   GLUCOSE 86 01/07/2020   CHOL 186 11/25/2019   TRIG 101.0 11/25/2019   HDL 76.20 11/25/2019   LDLDIRECT 131.0 03/19/2013   LDLCALC 90 11/25/2019   ALT 20 11/25/2019   AST 20 11/25/2019   NA 135 01/07/2020   K 3.9 01/07/2020   CL 98 01/07/2020   CREATININE 0.91 01/07/2020   BUN 11 01/07/2020   CO2 29 01/07/2020   TSH 1.52 01/07/2020   INR 0.99 04/05/2017   HGBA1C 5.3 01/07/2020    MM 3D SCREEN BREAST BILATERAL  Result Date: 04/14/2020 CLINICAL DATA:  Screening. EXAM: DIGITAL SCREENING BILATERAL MAMMOGRAM WITH TOMO AND CAD COMPARISON:  Previous exam(s). ACR Breast Density Category b: There are scattered areas of fibroglandular density. FINDINGS: There are no findings suspicious for malignancy. Images were processed with CAD. IMPRESSION: No mammographic evidence of malignancy. A result letter of this screening mammogram will be mailed directly to the patient. RECOMMENDATION: Screening mammogram in one year. (Code:SM-B-01Y) BI-RADS CATEGORY  1: Negative. Electronically Signed   By: Abelardo Diesel M.D.   On: 04/14/2020 11:33       Assessment & Plan:   Problem List Items Addressed This Visit   None      Einar Pheasant, MD

## 2020-07-16 NOTE — Progress Notes (Signed)
Patient ID: Amanda Browning, female   DOB: May 05, 1945, 75 y.o.   MRN: [id number]   Subjective:    Patient ID: Amanda Browning, female    DOB: Oct 05, 1945, 75 y.o.   MRN: [id number]  HPI This visit occurred during the SARS-CoV-2 public health emergency.  Safety protocols were in place, including screening questions prior to the visit, additional usage of staff PPE, and extensive cleaning of exam room while observing appropriate contact time as indicated for disinfecting solutions.  Patient here for a scheduled follow up. Here to follow up regarding increased stress and her blood pressure.  She is doing well.  Feels good. Working.  Tries to stay active. No chest pain or sob reported.  No abdominal pain or bowel change reported.  Eating.  No nausea reported.  Taking zoloft.  Feels stable - on this medication.   Past Medical History:  Diagnosis Date  . Anemia   . Chicken pox   . Colon polyps   . Complication of anesthesia    doesn't take much anesthesia to put pt to sleep, slow to wake up  . DDD (degenerative disc disease), cervical 02/05/13   C5-C6 and C6-C7  . GERD (gastroesophageal reflux disease)   . History of chicken pox   . Hypercholesterolemia   . Hyperlipidemia   . Hypertension   . Hypothyroidism   . Migraines   . PONV (postoperative nausea and vomiting)   . Squamous cell skin cancer    Past Surgical History:  Procedure Laterality Date  . BREAST BIOPSY Right 2012   stereotactic  . COLONOSCOPY WITH PROPOFOL N/A 02/23/2018   Procedure: COLONOSCOPY WITH PROPOFOL;  Surgeon: Manya Silvas, MD;  Location: [facility] ENDOSCOPY;  Service: Endoscopy;  Laterality: N/A;  . KNEE ARTHROPLASTY Right 04/17/2017   Procedure: COMPUTER ASSISTED TOTAL KNEE ARTHROPLASTY;  Surgeon: Dereck Leep, MD;  Location: [facility] ORS;  Service: Orthopedics;  Laterality: Right;  . SKIN BIOPSY    . VAGINAL HYSTERECTOMY  1994   ovaries not removed  . WISDOM TOOTH EXTRACTION     Family History  Problem  Relation Age of Onset  . Heart disease Mother   . Stroke Mother   . Hypertension Mother   . Heart attack Mother   . Heart disease Father        enlarged heart  . Stroke Father   . Hypertension Father   . Hyperlipidemia Father   . Heart attack Father   . Ovarian cancer Paternal Aunt   . Breast cancer Sister 90  . Cancer Sister        Breast Cancer  . Diabetes type II Brother   . Hypertension Brother   . Colon cancer Neg Hx   . Basal cell carcinoma Neg Hx   . Melanoma Neg Hx   . Squamous cell carcinoma Neg Hx    Social History   Socioeconomic History  . Marital status: Divorced    Spouse name: Not on file  . Number of children: 2  . Years of education: 39  . Highest education level: High school graduate  Occupational History  . Not on file  Tobacco Use  . Smoking status: Never Smoker  . Smokeless tobacco: Never Used  Vaping Use  . Vaping Use: Never used  Substance and Sexual Activity  . Alcohol use: No    Alcohol/week: 0.0 standard drinks  . Drug use: No  . Sexual activity: Never  Other Topics Concern  . Not on file  Social History  Narrative   Full Code   Alcohol - none   Never smoker and no smokeless tobacco   2 daughters   Social Determinants of Health   Financial Resource Strain: Not on file  Food Insecurity: Not on file  Transportation Needs: Not on file  Physical Activity: Not on file  Stress: Not on file  Social Connections: Not on file    Outpatient Encounter Medications as of 07/16/2020  Medication Sig  . acetaminophen (TYLENOL) 325 MG tablet Take 650 mg by mouth 4 (four) times daily as needed.  Marland Kitchen amLODipine (NORVASC) 2.5 MG tablet Take 1 tablet (2.5 mg total) by mouth daily.  Marland Kitchen aspirin EC 81 MG tablet Take by mouth.  . cetirizine (ZYRTEC) 10 MG tablet Take 10 mg by mouth daily.  . ferrous sulfate 325 (65 FE) MG tablet Take by mouth.  Marland Kitchen glucosamine-chondroitin 500-400 MG tablet Take 1 tablet by mouth daily.  Marland Kitchen levothyroxine (SYNTHROID) 50 MCG  tablet TAKE 1 TABLET EVERY DAY BEFORE BREAKFAST  . lisinopril-hydrochlorothiazide (ZESTORETIC) 20-25 MG tablet TAKE 1 TABLET EVERY DAY  . lovastatin (MEVACOR) 40 MG tablet TAKE 1 TABLET AT BEDTIME  . magnesium oxide (MAG-OX) 400 MG tablet Take 1 tablet (400 mg total) by mouth 2 (two) times daily.  . Multiple Vitamin (MULTIVITAMIN) tablet Take 1 tablet by mouth daily.  Marland Kitchen omeprazole (PRILOSEC) 20 MG capsule TAKE 1 CAPSULE TWICE DAILY BEFORE MEALS  . sertraline (ZOLOFT) 50 MG tablet TAKE 1/2 TABLET DAILY FOR 1 WEEK, THEN TAKE 1 TABLET DAILY  . vitamin B-12 (CYANOCOBALAMIN) 1000 MCG tablet Take 1 tablet by mouth daily.   No facility-administered encounter medications on file as of 07/16/2020.    Review of Systems  Constitutional: Negative for appetite change and unexpected weight change.  HENT: Negative for congestion and sinus pressure.   Respiratory: Negative for cough, chest tightness and shortness of breath.   Cardiovascular: Negative for chest pain, palpitations and leg swelling.  Gastrointestinal: Negative for abdominal pain, diarrhea, nausea and vomiting.  Genitourinary: Negative for difficulty urinating and dysuria.  Musculoskeletal: Negative for joint swelling and myalgias.  Skin: Negative for color change and rash.  Neurological: Negative for dizziness, light-headedness and headaches.  Psychiatric/Behavioral: Negative for agitation and dysphoric mood.       Objective:    Physical Exam Vitals reviewed.  Constitutional:      General: She is not in acute distress.    Appearance: Normal appearance.  HENT:     Head: Normocephalic and atraumatic.     Right Ear: External ear normal.     Left Ear: External ear normal.  Eyes:     General: No scleral icterus.       Right eye: No discharge.        Left eye: No discharge.     Conjunctiva/sclera: Conjunctivae normal.  Neck:     Thyroid: No thyromegaly.  Cardiovascular:     Rate and Rhythm: Normal rate and regular rhythm.   Pulmonary:     Effort: No respiratory distress.     Breath sounds: Normal breath sounds. No wheezing.  Abdominal:     General: Bowel sounds are normal.     Palpations: Abdomen is soft.     Tenderness: There is no abdominal tenderness.  Musculoskeletal:        General: No swelling or tenderness.     Cervical back: Neck supple. No tenderness.  Lymphadenopathy:     Cervical: No cervical adenopathy.  Skin:    Findings: No erythema  or rash.  Neurological:     Mental Status: She is alert.  Psychiatric:        Mood and Affect: Mood normal.        Behavior: Behavior normal.     BP 138/78   Pulse 70   Temp (!) 97.5 F (36.4 C) (Oral)   Resp 16   Ht 5\' 3"  (1.6 m)   Wt 209 lb (94.8 kg)   SpO2 98%   BMI 37.02 kg/m  Wt Readings from Last 3 Encounters:  07/16/20 209 lb (94.8 kg)  03/19/20 204 lb (92.5 kg)  12/10/19 205 lb (93 kg)     Lab Results  Component Value Date   WBC 4.8 11/25/2019   HGB 13.7 11/25/2019   HCT 40.2 11/25/2019   PLT 183.0 11/25/2019   GLUCOSE 92 07/16/2020   CHOL 209 (H) 07/16/2020   TRIG 89.0 07/16/2020   HDL 76.40 07/16/2020   LDLDIRECT 131.0 03/19/2013   LDLCALC 114 (H) 07/16/2020   ALT 17 07/16/2020   AST 20 07/16/2020   NA 137 07/16/2020   K 3.8 07/16/2020   CL 98 07/16/2020   CREATININE 0.75 07/16/2020   BUN 11 07/16/2020   CO2 32 07/16/2020   TSH 1.85 07/16/2020   INR 0.99 04/05/2017   HGBA1C 5.8 07/16/2020    MM 3D SCREEN BREAST BILATERAL  Result Date: 04/14/2020 CLINICAL DATA:  Screening. EXAM: DIGITAL SCREENING BILATERAL MAMMOGRAM WITH TOMO AND CAD COMPARISON:  Previous exam(s). ACR Breast Density Category b: There are scattered areas of fibroglandular density. FINDINGS: There are no findings suspicious for malignancy. Images were processed with CAD. IMPRESSION: No mammographic evidence of malignancy. A result letter of this screening mammogram will be mailed directly to the patient. RECOMMENDATION: Screening mammogram in one year.  (Code:SM-B-01Y) BI-RADS CATEGORY  1: Negative. Electronically Signed   By: Abelardo Diesel M.D.   On: 04/14/2020 11:33       Assessment & Plan:   Problem List Items Addressed This Visit    Anemia    hgb 11/25/19 - wnl.        GERD (gastroesophageal reflux disease)    Continue prilosec.  No upper symptoms.        Hypercholesterolemia    Continue lovastatin.  Low cholesterol diet and exercise.  Follow lipid panel and liver function tests.       Relevant Medications   amLODipine (NORVASC) 2.5 MG tablet   Hyperglycemia    Low carb diet and exercise.  Check a1c today.        Hypertension    Continue lisinopril/hctz.  Blood pressure above goal.  Discussed. Will add amlodipine 2.5mg  q day.  Follow pressures.  Follow metabolic panel.       Relevant Medications   amLODipine (NORVASC) 2.5 MG tablet   Hypothyroidism    On thyroid replacement.  Follow tsh.       Stress    Continue zoloft.  Doing well on the medication.  Follow.  No changes at this time.        Other Visit Diagnoses    Need for hepatitis C screening test    -  Primary   Relevant Orders   Hepatitis C antibody (Completed)       Einar Pheasant, MD

## 2020-07-17 LAB — HEPATITIS C ANTIBODY
Hepatitis C Ab: NONREACTIVE
SIGNAL TO CUT-OFF: 0.01 (ref <1.00)

## 2020-07-18 ENCOUNTER — Encounter: Payer: Self-pay | Admitting: Internal Medicine

## 2020-07-18 NOTE — Assessment & Plan Note (Signed)
Continue zoloft.  Doing well on the medication.  Follow.  No changes at this time.

## 2020-07-18 NOTE — Assessment & Plan Note (Signed)
Continue lovastatin.  Low cholesterol diet and exercise.  Follow lipid panel and liver function tests.  

## 2020-07-18 NOTE — Assessment & Plan Note (Signed)
On thyroid replacement.  Follow tsh.  

## 2020-07-18 NOTE — Assessment & Plan Note (Signed)
Continue lisinopril/hctz.  Blood pressure above goal.  Discussed. Will add amlodipine 2.5mg  q day.  Follow pressures.  Follow metabolic panel.

## 2020-07-18 NOTE — Assessment & Plan Note (Signed)
Low carb diet and exercise.  Check a1c today.

## 2020-07-18 NOTE — Assessment & Plan Note (Signed)
hgb 11/25/19 - wnl.

## 2020-07-18 NOTE — Assessment & Plan Note (Signed)
Continue prilosec.  No upper symptoms.   

## 2020-08-21 ENCOUNTER — Other Ambulatory Visit: Payer: Self-pay | Admitting: Internal Medicine

## 2020-09-10 ENCOUNTER — Other Ambulatory Visit: Payer: Self-pay | Admitting: Internal Medicine

## 2020-09-19 ENCOUNTER — Other Ambulatory Visit: Payer: Self-pay | Admitting: Internal Medicine

## 2020-09-21 ENCOUNTER — Other Ambulatory Visit: Payer: Self-pay | Admitting: Internal Medicine

## 2020-09-30 DIAGNOSIS — D2262 Melanocytic nevi of left upper limb, including shoulder: Secondary | ICD-10-CM | POA: Diagnosis not present

## 2020-09-30 DIAGNOSIS — D225 Melanocytic nevi of trunk: Secondary | ICD-10-CM | POA: Diagnosis not present

## 2020-09-30 DIAGNOSIS — D485 Neoplasm of uncertain behavior of skin: Secondary | ICD-10-CM | POA: Diagnosis not present

## 2020-09-30 DIAGNOSIS — L570 Actinic keratosis: Secondary | ICD-10-CM | POA: Diagnosis not present

## 2020-09-30 DIAGNOSIS — D2261 Melanocytic nevi of right upper limb, including shoulder: Secondary | ICD-10-CM | POA: Diagnosis not present

## 2020-09-30 DIAGNOSIS — Z85828 Personal history of other malignant neoplasm of skin: Secondary | ICD-10-CM | POA: Diagnosis not present

## 2020-09-30 DIAGNOSIS — D2271 Melanocytic nevi of right lower limb, including hip: Secondary | ICD-10-CM | POA: Diagnosis not present

## 2020-10-16 ENCOUNTER — Encounter: Payer: Self-pay | Admitting: Internal Medicine

## 2020-10-16 ENCOUNTER — Ambulatory Visit (INDEPENDENT_AMBULATORY_CARE_PROVIDER_SITE_OTHER): Payer: Medicare HMO | Admitting: Internal Medicine

## 2020-10-16 ENCOUNTER — Other Ambulatory Visit: Payer: Self-pay

## 2020-10-16 DIAGNOSIS — E7800 Pure hypercholesterolemia, unspecified: Secondary | ICD-10-CM | POA: Diagnosis not present

## 2020-10-16 DIAGNOSIS — D649 Anemia, unspecified: Secondary | ICD-10-CM | POA: Diagnosis not present

## 2020-10-16 DIAGNOSIS — F439 Reaction to severe stress, unspecified: Secondary | ICD-10-CM | POA: Diagnosis not present

## 2020-10-16 DIAGNOSIS — D485 Neoplasm of uncertain behavior of skin: Secondary | ICD-10-CM | POA: Diagnosis not present

## 2020-10-16 DIAGNOSIS — K219 Gastro-esophageal reflux disease without esophagitis: Secondary | ICD-10-CM | POA: Diagnosis not present

## 2020-10-16 DIAGNOSIS — I10 Essential (primary) hypertension: Secondary | ICD-10-CM

## 2020-10-16 DIAGNOSIS — E039 Hypothyroidism, unspecified: Secondary | ICD-10-CM

## 2020-10-16 DIAGNOSIS — R739 Hyperglycemia, unspecified: Secondary | ICD-10-CM | POA: Diagnosis not present

## 2020-10-16 DIAGNOSIS — L570 Actinic keratosis: Secondary | ICD-10-CM | POA: Diagnosis not present

## 2020-10-16 MED ORDER — AMLODIPINE BESYLATE 5 MG PO TABS: 5.0000 mg | ORAL_TABLET | Freq: Every day | ORAL | 1 refills | Status: DC

## 2020-10-16 NOTE — Progress Notes (Signed)
Patient ID: Amanda Browning, female   DOB: [birth year], 75 y.o.   MRN: [id number]   Subjective:    Patient ID: Amanda Browning, female    DOB: 19-Mar-1946, 75 y.o.   MRN: [id number]  HPI This visit occurred during the SARS-CoV-2 public health emergency.  Safety protocols were in place, including screening questions prior to the visit, additional usage of staff PPE, and extensive cleaning of exam room while observing appropriate contact time as indicated for disinfecting solutions.   Patient here for a scheduled follow up.  Here to follow-up regarding her blood pressure.  We started her on amlodipine last visit.  She is tolerating the medication.  States she can tell a big difference in her blood pressure readings.  Most systolics ranging between 128-140.  No chest pain tightness or shortness of breath.  No nausea or vomiting.  No abdominal pain or cramping.  Bowels stable.  States she is handling stress.  Taking her Zoloft.  Does not feel she needs any further intervention.   Past Medical History:  Diagnosis Date   Anemia    Chicken pox    Colon polyps    Complication of anesthesia    doesn't take much anesthesia to put pt to sleep, slow to wake up   DDD (degenerative disc disease), cervical 02/05/13   C5-C6 and C6-C7   GERD (gastroesophageal reflux disease)    History of chicken pox    Hypercholesterolemia    Hyperlipidemia    Hypertension    Hypothyroidism    Migraines    PONV (postoperative nausea and vomiting)    Squamous cell skin cancer    Past Surgical History:  Procedure Laterality Date   BREAST BIOPSY Right 2012   stereotactic   COLONOSCOPY WITH PROPOFOL N/A 02/23/2018   Procedure: COLONOSCOPY WITH PROPOFOL;  Surgeon: Manya Silvas, MD;  Location: [facility] ENDOSCOPY;  Service: Endoscopy;  Laterality: N/A;   KNEE ARTHROPLASTY Right 04/17/2017   Procedure: COMPUTER ASSISTED TOTAL KNEE ARTHROPLASTY;  Surgeon: Dereck Leep, MD;  Location: [facility] ORS;  Service: Orthopedics;   Laterality: Right;   SKIN BIOPSY     VAGINAL HYSTERECTOMY  1994   ovaries not removed   WISDOM TOOTH EXTRACTION     Family History  Problem Relation Age of Onset   Heart disease Mother    Stroke Mother    Hypertension Mother    Heart attack Mother    Heart disease Father        enlarged heart   Stroke Father    Hypertension Father    Hyperlipidemia Father    Heart attack Father    Ovarian cancer Paternal Aunt    Breast cancer Sister 88   Cancer Sister        Breast Cancer   Diabetes type II Brother    Hypertension Brother    Colon cancer Neg Hx    Basal cell carcinoma Neg Hx    Melanoma Neg Hx    Squamous cell carcinoma Neg Hx    Social History   Socioeconomic History   Marital status: Divorced    Spouse name: Not on file   Number of children: 2   Years of education: 12   Highest education level: High school graduate  Occupational History   Not on file  Tobacco Use   Smoking status: Never   Smokeless tobacco: Never  Vaping Use   Vaping Use: Never used  Substance and Sexual Activity   Alcohol use: No  Alcohol/week: 0.0 standard drinks   Drug use: No   Sexual activity: Never  Other Topics Concern   Not on file  Social History Narrative   Full Code   Alcohol - none   Never smoker and no smokeless tobacco   2 daughters   Social Determinants of Health   Financial Resource Strain: Not on file  Food Insecurity: Not on file  Transportation Needs: Not on file  Physical Activity: Not on file  Stress: Not on file  Social Connections: Not on file    Review of Systems  Constitutional:  Negative for appetite change and unexpected weight change.  HENT:  Negative for congestion and sinus pressure.   Respiratory:  Negative for cough, chest tightness and shortness of breath.   Cardiovascular:  Negative for chest pain, palpitations and leg swelling.  Gastrointestinal:  Negative for abdominal pain, nausea and vomiting.  Genitourinary:  Negative for difficulty  urinating and dysuria.  Musculoskeletal:  Negative for joint swelling and myalgias.  Skin:  Negative for color change and rash.  Neurological:  Negative for dizziness, light-headedness and headaches.  Psychiatric/Behavioral:  Negative for agitation and dysphoric mood.       Objective:    Physical Exam Vitals reviewed.  Constitutional:      General: She is not in acute distress.    Appearance: Normal appearance.  HENT:     Head: Normocephalic and atraumatic.     Right Ear: External ear normal.     Left Ear: External ear normal.  Eyes:     General: No scleral icterus.       Right eye: No discharge.        Left eye: No discharge.     Conjunctiva/sclera: Conjunctivae normal.  Neck:     Thyroid: No thyromegaly.  Cardiovascular:     Rate and Rhythm: Normal rate and regular rhythm.  Pulmonary:     Effort: No respiratory distress.     Breath sounds: Normal breath sounds. No wheezing.  Abdominal:     General: Bowel sounds are normal.     Palpations: Abdomen is soft.     Tenderness: There is no abdominal tenderness.  Musculoskeletal:        General: No swelling or tenderness.     Cervical back: Neck supple. No tenderness.  Lymphadenopathy:     Cervical: No cervical adenopathy.  Skin:    Findings: No erythema or rash.  Neurological:     Mental Status: She is alert.  Psychiatric:        Mood and Affect: Mood normal.        Behavior: Behavior normal.    BP 132/84   Pulse 95   Temp 98 F (36.7 C)   Ht 5' 2.99" (1.6 m)   Wt 208 lb 9.6 oz (94.6 kg)   SpO2 96%   BMI 36.96 kg/m  Wt Readings from Last 3 Encounters:  10/16/20 208 lb 9.6 oz (94.6 kg)  07/16/20 209 lb (94.8 kg)  03/19/20 204 lb (92.5 kg)    Outpatient Encounter Medications as of 10/16/2020  Medication Sig   acetaminophen (TYLENOL) 325 MG tablet Take 650 mg by mouth 4 (four) times daily as needed.   amLODipine (NORVASC) 5 MG tablet Take 1 tablet (5 mg total) by mouth daily.   aspirin EC 81 MG tablet Take by  mouth.   cetirizine (ZYRTEC) 10 MG tablet Take 10 mg by mouth daily.   ferrous sulfate 325 (65 FE) MG tablet Take by mouth.  glucosamine-chondroitin 500-400 MG tablet Take 1 tablet by mouth daily.   levothyroxine (SYNTHROID) 50 MCG tablet TAKE 1 TABLET EVERY DAY BEFORE BREAKFAST   lisinopril-hydrochlorothiazide (ZESTORETIC) 20-25 MG tablet TAKE 1 TABLET EVERY DAY   lovastatin (MEVACOR) 40 MG tablet TAKE 1 TABLET AT BEDTIME   magnesium oxide (MAG-OX) 400 MG tablet Take 1 tablet (400 mg total) by mouth 2 (two) times daily.   Multiple Vitamin (MULTIVITAMIN) tablet Take 1 tablet by mouth daily.   omeprazole (PRILOSEC) 20 MG capsule TAKE 1 CAPSULE TWICE DAILY BEFORE MEALS   sertraline (ZOLOFT) 50 MG tablet TAKE 1/2 TABLET DAILY FOR 1 WEEK, THEN TAKE 1 TABLET DAILY   vitamin B-12 (CYANOCOBALAMIN) 1000 MCG tablet Take 1 tablet by mouth daily.   [DISCONTINUED] amLODipine (NORVASC) 2.5 MG tablet Take 1 tablet (2.5 mg total) by mouth daily.   No facility-administered encounter medications on file as of 10/16/2020.     Lab Results  Component Value Date   WBC 4.8 11/25/2019   HGB 13.7 11/25/2019   HCT 40.2 11/25/2019   PLT 183.0 11/25/2019   GLUCOSE 92 07/16/2020   CHOL 209 (H) 07/16/2020   TRIG 89.0 07/16/2020   HDL 76.40 07/16/2020   LDLDIRECT 131.0 03/19/2013   LDLCALC 114 (H) 07/16/2020   ALT 17 07/16/2020   AST 20 07/16/2020   NA 137 07/16/2020   K 3.8 07/16/2020   CL 98 07/16/2020   CREATININE 0.75 07/16/2020   BUN 11 07/16/2020   CO2 32 07/16/2020   TSH 1.85 07/16/2020   INR 0.99 04/05/2017   HGBA1C 5.8 07/16/2020    MM 3D SCREEN BREAST BILATERAL  Result Date: 04/14/2020 CLINICAL DATA:  Screening. EXAM: DIGITAL SCREENING BILATERAL MAMMOGRAM WITH TOMO AND CAD COMPARISON:  Previous exam(s). ACR Breast Density Category b: There are scattered areas of fibroglandular density. FINDINGS: There are no findings suspicious for malignancy. Images were processed with CAD. IMPRESSION: No  mammographic evidence of malignancy. A result letter of this screening mammogram will be mailed directly to the patient. RECOMMENDATION: Screening mammogram in one year. (Code:SM-B-01Y) BI-RADS CATEGORY  1: Negative. Electronically Signed   By: Abelardo Diesel M.D.   On: 04/14/2020 11:33       Assessment & Plan:   Problem List Items Addressed This Visit     Anemia    Follow cbc.        GERD (gastroesophageal reflux disease)    Continue prilosec.  No upper symptoms.         Hypercholesterolemia    Continue lovastatin.  Low cholesterol diet and exercise.  Follow lipid panel and liver function tests.        Relevant Medications   amLODipine (NORVASC) 5 MG tablet   Hyperglycemia    Low carb diet and exercise.  Follow met b and a1c.        Hypertension    Continue lisinopril/hctz.  Blood pressure remains above goal.  Discussed. Will increase amlodipine to 31mq day.  Follow pressures.  Follow metabolic panel.        Relevant Medications   amLODipine (NORVASC) 5 MG tablet   Hypothyroidism    On thyroid replacement.  Follow tsh.        Severe [place] (BMI 35.0-39.9[place]    Has hypertension, hypercholesterolemia and hyperglycemia.  Low carb diet and exercise.  Follow.        Stress    Continue zoloft.  Doing well on the medication.  Follow.  No changes at this  time.          Einar Pheasant, MD

## 2020-10-18 ENCOUNTER — Encounter: Payer: Self-pay | Admitting: Internal Medicine

## 2020-10-18 NOTE — Assessment & Plan Note (Signed)
Continue prilosec.  No upper symptoms.   

## 2020-10-18 NOTE — Assessment & Plan Note (Signed)
Continue lovastatin.  Low cholesterol diet and exercise.  Follow lipid panel and liver function tests.  

## 2020-10-18 NOTE — Assessment & Plan Note (Signed)
Continue lisinopril/hctz.  Blood pressure remains above goal.  Discussed. Will increase amlodipine to 12m q day.  Follow pressures.  Follow metabolic panel.

## 2020-10-18 NOTE — Assessment & Plan Note (Signed)
Follow cbc.  

## 2020-10-18 NOTE — Assessment & Plan Note (Signed)
Has hypertension, hypercholesterolemia and hyperglycemia.  Low carb diet and exercise.  Follow.

## 2020-10-18 NOTE — Assessment & Plan Note (Signed)
Continue zoloft.  Doing well on the medication.  Follow.  No changes at this time.

## 2020-10-18 NOTE — Assessment & Plan Note (Signed)
On thyroid replacement.  Follow tsh.  

## 2020-10-18 NOTE — Assessment & Plan Note (Signed)
Low carb diet and exercise.  Follow met b and a1c.  

## 2020-11-05 DIAGNOSIS — L570 Actinic keratosis: Secondary | ICD-10-CM | POA: Diagnosis not present

## 2020-11-05 DIAGNOSIS — X32XXXA Exposure to sunlight, initial encounter: Secondary | ICD-10-CM | POA: Diagnosis not present

## 2021-01-12 ENCOUNTER — Ambulatory Visit (INDEPENDENT_AMBULATORY_CARE_PROVIDER_SITE_OTHER): Payer: Medicare HMO | Admitting: Internal Medicine

## 2021-01-12 ENCOUNTER — Other Ambulatory Visit: Payer: Self-pay

## 2021-01-12 VITALS — BP 124/78 | HR 87 | Temp 97.6°F | Resp 16 | Ht 63.0 in | Wt 215.0 lb

## 2021-01-12 DIAGNOSIS — Z23 Encounter for immunization: Secondary | ICD-10-CM | POA: Diagnosis not present

## 2021-01-12 DIAGNOSIS — E7800 Pure hypercholesterolemia, unspecified: Secondary | ICD-10-CM | POA: Diagnosis not present

## 2021-01-12 DIAGNOSIS — K219 Gastro-esophageal reflux disease without esophagitis: Secondary | ICD-10-CM | POA: Diagnosis not present

## 2021-01-12 DIAGNOSIS — Z0000 Encounter for general adult medical examination without abnormal findings: Secondary | ICD-10-CM

## 2021-01-12 DIAGNOSIS — E039 Hypothyroidism, unspecified: Secondary | ICD-10-CM | POA: Diagnosis not present

## 2021-01-12 DIAGNOSIS — Z1231 Encounter for screening mammogram for malignant neoplasm of breast: Secondary | ICD-10-CM | POA: Diagnosis not present

## 2021-01-12 DIAGNOSIS — R739 Hyperglycemia, unspecified: Secondary | ICD-10-CM | POA: Diagnosis not present

## 2021-01-12 DIAGNOSIS — I10 Essential (primary) hypertension: Secondary | ICD-10-CM

## 2021-01-12 DIAGNOSIS — F439 Reaction to severe stress, unspecified: Secondary | ICD-10-CM | POA: Diagnosis not present

## 2021-01-12 DIAGNOSIS — D649 Anemia, unspecified: Secondary | ICD-10-CM

## 2021-01-12 LAB — LIPID PANEL
Cholesterol: 192 mg/dL (ref 0–200)
HDL: 77.1 mg/dL (ref >39.00)
LDL Cholesterol: 94 mg/dL (ref 0–99)
NonHDL: 114.84
Total CHOL/HDL Ratio: 2
Triglycerides: 105 mg/dL (ref 0.0–149.0)
VLDL: 21 mg/dL (ref 0.0–40.0)

## 2021-01-12 LAB — BASIC METABOLIC PANEL
BUN: 12 mg/dL (ref 6–23)
CO2: 30 mEq/L (ref 19–32)
Calcium: 9.6 mg/dL (ref 8.4–10.5)
Chloride: 95 mEq/L — ABNORMAL LOW (ref 96–112)
Creatinine, Ser: 0.96 mg/dL (ref 0.40–1.20)
GFR: 58.07 mL/min — ABNORMAL LOW (ref >60.00)
Glucose, Bld: 94 mg/dL (ref 70–99)
Potassium: 4.1 mEq/L (ref 3.5–5.1)
Sodium: 133 mEq/L — ABNORMAL LOW (ref 135–145)

## 2021-01-12 LAB — CBC WITH DIFFERENTIAL/PLATELET
Basophils Absolute: 0.1 10*3/uL (ref 0.0–0.1)
Basophils Relative: 1 % (ref 0.0–3.0)
Eosinophils Absolute: 0.3 10*3/uL (ref 0.0–0.7)
Eosinophils Relative: 6.3 % — ABNORMAL HIGH (ref 0.0–5.0)
HCT: 38.6 % (ref 36.0–46.0)
Hemoglobin: 12.9 g/dL (ref 12.0–15.0)
Lymphocytes Relative: 33.6 % (ref 12.0–46.0)
Lymphs Abs: 1.7 10*3/uL (ref 0.7–4.0)
MCHC: 33.3 g/dL (ref 30.0–36.0)
MCV: 97.7 fl (ref 78.0–100.0)
Monocytes Absolute: 0.6 10*3/uL (ref 0.1–1.0)
Monocytes Relative: 12.2 % — ABNORMAL HIGH (ref 3.0–12.0)
Neutro Abs: 2.4 10*3/uL (ref 1.4–7.7)
Neutrophils Relative %: 46.9 % (ref 43.0–77.0)
Platelets: 218 10*3/uL (ref 150.0–400.0)
RBC: 3.95 Mil/uL (ref 3.87–5.11)
RDW: 12.6 % (ref 11.5–15.5)
WBC: 5.2 10*3/uL (ref 4.0–10.5)

## 2021-01-12 LAB — HEPATIC FUNCTION PANEL
ALT: 17 U/L (ref 0–35)
AST: 18 U/L (ref 0–37)
Albumin: 4.1 g/dL (ref 3.5–5.2)
Alkaline Phosphatase: 62 U/L (ref 39–117)
Bilirubin, Direct: 0.2 mg/dL (ref 0.0–0.3)
Total Bilirubin: 0.9 mg/dL (ref 0.2–1.2)
Total Protein: 6.4 g/dL (ref 6.0–8.3)

## 2021-01-12 LAB — HEMOGLOBIN A1C: Hgb A1c MFr Bld: 5.9 % (ref 4.6–6.5)

## 2021-01-12 NOTE — Assessment & Plan Note (Signed)
Physical today 01/12/21.  Mammogram 04/14/20 - Birads I.  Colonoscopy 02/2018.  Recommended f/u in 5 years.

## 2021-01-12 NOTE — Assessment & Plan Note (Addendum)
Amlodipine was increased to 5mg  q day last visit.  Continues on lisinopril/hctz.  Outside blood pressures as outlined.  Doing well.  Continue current medications.  Follow pressures.  Follow metabolic panel.

## 2021-01-12 NOTE — Progress Notes (Signed)
Patient ID: Amanda Browning, female   DOB: [birth year], 75 y.o.   MRN: [id number]   Subjective:    Patient ID: Amanda Browning, female    DOB: [birth year], 75 y.o.   MRN: [id number]  This visit occurred during the SARS-CoV-2 public health emergency.  Safety protocols were in place, including screening questions prior to the visit, additional usage of staff PPE, and extensive cleaning of exam room while observing appropriate contact time as indicated for disinfecting solutions.   Patient here for her physical exam.   Chief Complaint  Patient presents with   Annual Exam   .   HPI Amlodipine increased to 81m per day.  She is doing well with her medications.  Outside blood pressures <130/70s.  Stays active.  No chest pain.  Breathing stable.  No acid reflux reported.  No abdominal pain.  Bowels moving.  Agreeable for flu vaccine.     Past Medical History:  Diagnosis Date   Anemia    Chicken pox    Colon polyps    Complication of anesthesia    doesn't take much anesthesia to put pt to sleep, slow to wake up   DDD (degenerative disc disease), cervical 02/05/13   C5-C6 and C6-C7   GERD (gastroesophageal reflux disease)    History of chicken pox    Hypercholesterolemia    Hyperlipidemia    Hypertension    Hypothyroidism    Migraines    PONV (postoperative nausea and vomiting)    Squamous cell skin cancer    Past Surgical History:  Procedure Laterality Date   BREAST BIOPSY Right 2012   stereotactic   COLONOSCOPY WITH PROPOFOL N/A 02/23/2018   Procedure: COLONOSCOPY WITH PROPOFOL;  Surgeon: EManya Silvas MD;  [facility]  Service: Endoscopy;  Laterality: N/A;   KNEE ARTHROPLASTY Right 04/17/2017   Procedure: COMPUTER ASSISTED TOTAL KNEE ARTHROPLASTY;  Surgeon: HDereck Leep MD;  Location: [facility] ORS;  Service: Orthopedics;  Laterality: Right;   SKIN BIOPSY     VAGINAL HYSTERECTOMY  1994   ovaries not removed   WISDOM TOOTH EXTRACTION     Family History   Problem Relation Age of Onset   Heart disease Mother    Stroke Mother    Hypertension Mother    Heart attack Mother    Heart disease Father        enlarged heart   Stroke Father    Hypertension Father    Hyperlipidemia Father    Heart attack Father    Ovarian cancer Paternal Aunt    Breast cancer Sister [id number]  Cancer Sister        Breast Cancer   Diabetes type II Brother    Hypertension Brother    Colon cancer Neg Hx    Basal cell carcinoma Neg Hx    Melanoma Neg Hx    Squamous cell carcinoma Neg Hx    Social History   Socioeconomic History   Marital status: Divorced    Spouse name: Not on file   Number of children: 2   Years of education: 12   Highest education level: High school graduate  Occupational History   Not on file  Tobacco Use   Smoking status: Never   Smokeless tobacco: Never  Vaping Use   Vaping Use: Never used  Substance and Sexual Activity   Alcohol use: No    Alcohol/week: 0.0 standard drinks   Drug use: No   Sexual activity: Never  Other Topics Concern  Not on file  Social History Narrative   Full Code   Alcohol - none   Never smoker and no smokeless tobacco   2 daughters   Social Determinants of Health   Financial Resource Strain: Not on file  Food Insecurity: Not on file  Transportation Needs: Not on file  Physical Activity: Not on file  Stress: Not on file  Social Connections: Not on file     Review of Systems  Constitutional:  Negative for appetite change and unexpected weight change.  HENT:  Negative for congestion, sinus pressure and sore throat.   Eyes:  Negative for pain and visual disturbance.  Respiratory:  Negative for cough, chest tightness and shortness of breath.   Cardiovascular:  Negative for chest pain, palpitations and leg swelling.  Gastrointestinal:  Negative for abdominal pain, diarrhea, nausea and vomiting.  Genitourinary:  Negative for difficulty urinating and dysuria.  Musculoskeletal:  Negative for joint  swelling and myalgias.  Skin:  Negative for color change and rash.  Neurological:  Negative for dizziness, light-headedness and headaches.  Hematological:  Negative for adenopathy. Does not bruise/bleed easily.  Psychiatric/Behavioral:  Negative for agitation and dysphoric mood.       Objective:     BP 124/78   Pulse 87   Temp 97.6 F (36.4 C)   Resp 16   Ht _0  (1.6 m)   Wt 215 lb (97.5 kg)   SpO2 98%   BMI 38.09 kg/m  Wt Readings from Last 3 Encounters:  01/12/21 215 lb (97.5 kg)  10/16/20 208 lb 9.6 oz (94.6 kg)  07/16/20 209 lb (94.8 kg)    Physical Exam Vitals reviewed.  Constitutional:      General: She is not in acute distress.    Appearance: Normal appearance. She is well-developed.  HENT:     Head: Normocephalic and atraumatic.     Right Ear: External ear normal.     Left Ear: External ear normal.  Eyes:     General: No scleral icterus.       Right eye: No discharge.        Left eye: No discharge.     Conjunctiva/sclera: Conjunctivae normal.  Neck:     Thyroid: No thyromegaly.  Cardiovascular:     Rate and Rhythm: Normal rate and regular rhythm.  Pulmonary:     Effort: No tachypnea, accessory muscle usage or respiratory distress.     Breath sounds: Normal breath sounds. No decreased breath sounds or wheezing.  Chest:  Breasts:    Right: No inverted nipple, mass, nipple discharge or tenderness (no axillary adenopathy).     Left: No inverted nipple, mass, nipple discharge or tenderness (no axilarry adenopathy).  Abdominal:     General: Bowel sounds are normal.     Palpations: Abdomen is soft.     Tenderness: There is no abdominal tenderness.  Musculoskeletal:        General: No swelling or tenderness.     Cervical back: Neck supple. No tenderness.  Lymphadenopathy:     Cervical: No cervical adenopathy.  Skin:    Findings: No erythema or rash.  Neurological:     Mental Status: She is alert and oriented to person, place, and time.  Psychiatric:         Mood and Affect: Mood normal.        Behavior: Behavior normal.     Outpatient Encounter Medications as of 01/12/2021  Medication Sig   acetaminophen (TYLENOL) 325 MG tablet Take  650 mg by mouth 4 (four) times daily as needed.   amLODipine (NORVASC) 5 MG tablet Take 1 tablet (5 mg total) by mouth daily.   aspirin EC 81 MG tablet Take by mouth.   cetirizine (ZYRTEC) 10 MG tablet Take 10 mg by mouth daily.   ferrous sulfate 325 (65 FE) MG tablet Take by mouth.   glucosamine-chondroitin 500-400 MG tablet Take 1 tablet by mouth daily.   levothyroxine (SYNTHROID) 50 MCG tablet TAKE 1 TABLET EVERY DAY BEFORE BREAKFAST   lisinopril-hydrochlorothiazide (ZESTORETIC) 20-25 MG tablet TAKE 1 TABLET EVERY DAY   lovastatin (MEVACOR) 40 MG tablet TAKE 1 TABLET AT BEDTIME   magnesium oxide (MAG-OX) 400 MG tablet Take 1 tablet (400 mg total) by mouth 2 (two) times daily.   Multiple Vitamin (MULTIVITAMIN) tablet Take 1 tablet by mouth daily.   omeprazole (PRILOSEC) 20 MG capsule TAKE 1 CAPSULE TWICE DAILY BEFORE MEALS   sertraline (ZOLOFT) 50 MG tablet TAKE 1/2 TABLET DAILY FOR 1 WEEK, THEN TAKE 1 TABLET DAILY   vitamin B-12 (CYANOCOBALAMIN) 1000 MCG tablet Take 1 tablet by mouth daily.   No facility-administered encounter medications on file as of 01/12/2021.     Lab Results  Component Value Date   WBC 5.2 01/12/2021   HGB 12.9 01/12/2021   HCT 38.6 01/12/2021   PLT 218.0 01/12/2021   GLUCOSE 94 01/12/2021   CHOL 192 01/12/2021   TRIG 105.0 01/12/2021   HDL 77.10 01/12/2021   LDLDIRECT 131.0 03/19/2013   LDLCALC 94 01/12/2021   ALT 17 01/12/2021   AST 18 01/12/2021   NA 133 (L) 01/12/2021   K 4.1 01/12/2021   CL 95 (L) 01/12/2021   CREATININE 0.96 01/12/2021   BUN 12 01/12/2021   CO2 30 01/12/2021   TSH 1.85 07/16/2020   INR 0.99 04/05/2017   HGBA1C 5.9 01/12/2021    MM 3D SCREEN BREAST BILATERAL  Result Date: 04/14/2020 CLINICAL DATA:  Screening. EXAM: DIGITAL SCREENING  BILATERAL MAMMOGRAM WITH TOMO AND CAD COMPARISON:  Previous exam(s). ACR Breast Density Category b: There are scattered areas of fibroglandular density. FINDINGS: There are no findings suspicious for malignancy. Images were processed with CAD. IMPRESSION: No mammographic evidence of malignancy. A result letter of this screening mammogram will be mailed directly to the patient. RECOMMENDATION: Screening mammogram in one year. (Code:SM-B-01Y) BI-RADS CATEGORY  1: Negative. Electronically Signed   By: Abelardo Diesel M.D.   On: 04/14/2020 11:33       Assessment & Plan:   Problem List Items Addressed This Visit     Anemia    Follow cbc.       GERD (gastroesophageal reflux disease)    Continue prilosec.  No upper symptoms.        Health care maintenance    Physical today 01/12/21.  Mammogram 04/14/20 - Birads I.  Colonoscopy 02/2018.  Recommended f/u in 5 years.       Hypercholesterolemia    Continue lovastatin.  Low cholesterol diet and exercise.  Follow lipid panel and liver function tests.       Relevant Orders   Hepatic function panel (Completed)   Lipid panel (Completed)   Hyperglycemia    Low carb diet and exercise.  Follow met b and a1c.       Relevant Orders   Hemoglobin A1c (Completed)   Hypertension    Amlodipine was increased to 22m q day last visit.  Continues on lisinopril/hctz.  Outside blood pressures as outlined.  Doing well.  Continue current medications.  Follow pressures.  Follow metabolic panel.      Relevant Orders   CBC with Differential/Platelet (Completed)   Basic metabolic panel (Completed)   Hypothyroidism    On thyroid replacement.  Follow tsh.       Stress    Continue zoloft.  Doing well on the medication.  Follow.  No changes at this time.       Other Visit Diagnoses     Routine general medical examination at a health care facility    -  Primary   Visit for screening mammogram       Relevant Orders   MM 3D SCREEN BREAST BILATERAL   Need for  immunization against influenza       Relevant Orders   Flu Vaccine QUAD High Dose(Fluad) (Completed)        Einar Pheasant, MD

## 2021-01-14 ENCOUNTER — Telehealth: Payer: Self-pay

## 2021-01-14 DIAGNOSIS — E871 Hypo-osmolality and hyponatremia: Secondary | ICD-10-CM

## 2021-01-14 NOTE — Telephone Encounter (Signed)
Labs ordered for sodium recheck for future labs.

## 2021-01-17 ENCOUNTER — Encounter: Payer: Self-pay | Admitting: Internal Medicine

## 2021-01-17 NOTE — Assessment & Plan Note (Signed)
Continue prilosec.  No upper symptoms.   

## 2021-01-17 NOTE — Assessment & Plan Note (Signed)
Continue zoloft.  Doing well on the medication.  Follow.  No changes at this time.

## 2021-01-17 NOTE — Assessment & Plan Note (Signed)
Follow cbc.  

## 2021-01-17 NOTE — Assessment & Plan Note (Signed)
Continue lovastatin.  Low cholesterol diet and exercise.  Follow lipid panel and liver function tests.  

## 2021-01-17 NOTE — Assessment & Plan Note (Signed)
On thyroid replacement.  Follow tsh.  

## 2021-01-17 NOTE — Assessment & Plan Note (Signed)
Low carb diet and exercise.  Follow met b and a1c.  

## 2021-01-25 ENCOUNTER — Other Ambulatory Visit: Payer: Medicare HMO

## 2021-01-26 ENCOUNTER — Other Ambulatory Visit (INDEPENDENT_AMBULATORY_CARE_PROVIDER_SITE_OTHER): Payer: Medicare HMO

## 2021-01-26 ENCOUNTER — Other Ambulatory Visit: Payer: Self-pay

## 2021-01-26 DIAGNOSIS — E871 Hypo-osmolality and hyponatremia: Secondary | ICD-10-CM

## 2021-01-26 LAB — SODIUM: Sodium: 139 mEq/L (ref 135–145)

## 2021-03-10 ENCOUNTER — Other Ambulatory Visit: Payer: Self-pay

## 2021-03-10 ENCOUNTER — Telehealth: Payer: Self-pay | Admitting: Internal Medicine

## 2021-03-10 MED ORDER — ALBUTEROL SULFATE HFA 108 (90 BASE) MCG/ACT IN AERS: 2.0000 | INHALATION_SPRAY | Freq: Four times a day (QID) | RESPIRATORY_TRACT | 0 refills | Status: DC | PRN

## 2021-03-10 NOTE — Telephone Encounter (Signed)
Pt daughter called stating she is returning a phone call

## 2021-03-10 NOTE — Telephone Encounter (Signed)
Patients daughter stated that patient was sick about 4 weeks ago. Confirmed no sob, chest tightness, fever, congestion or any other acute symptoms but patient is having some wheezing more so at night. She used to have inhaler to use prn with wheezing. Sent in albuterol inhaler per Dr Nicki Reaper and advised patient to call with update next week. If persistent wheezing, will need to be evaluated.

## 2021-03-10 NOTE — Telephone Encounter (Signed)
LMTCB

## 2021-03-10 NOTE — Telephone Encounter (Signed)
Pts daughter [name] Cox called in regards to pt. Pt had a cold a few weeks ago. Symptoms included runny nose, cough, congestion. Pt is better however she still has a wheeze from this cold. Pts daughter was wondering if the pt could be prescribed an inhaler of some sort to help clear everything out of her chest. Daughter states we can call pt back in regards to this. [phone]

## 2021-03-12 ENCOUNTER — Other Ambulatory Visit: Payer: Self-pay

## 2021-03-12 ENCOUNTER — Inpatient Hospital Stay: Payer: Medicare HMO

## 2021-03-12 ENCOUNTER — Emergency Department: Payer: Medicare HMO

## 2021-03-12 ENCOUNTER — Inpatient Hospital Stay
Admission: EM | Admit: 2021-03-12 | Discharge: 2021-03-20 | DRG: 377 | Disposition: A | Discharge status: Home / Self Care | Payer: Medicare HMO | Attending: Internal Medicine | Admitting: Internal Medicine

## 2021-03-12 ENCOUNTER — Encounter: Payer: Self-pay | Admitting: Internal Medicine

## 2021-03-12 DIAGNOSIS — Z452 Encounter for adjustment and management of vascular access device: Secondary | ICD-10-CM

## 2021-03-12 DIAGNOSIS — Z7982 Long term (current) use of aspirin: Secondary | ICD-10-CM

## 2021-03-12 DIAGNOSIS — I110 Hypertensive heart disease with heart failure: Secondary | ICD-10-CM | POA: Diagnosis not present

## 2021-03-12 DIAGNOSIS — R6510 Systemic inflammatory response syndrome (SIRS) of non-infectious origin without acute organ dysfunction: Secondary | ICD-10-CM | POA: Diagnosis not present

## 2021-03-12 DIAGNOSIS — T68XXXA Hypothermia, initial encounter: Secondary | ICD-10-CM | POA: Diagnosis not present

## 2021-03-12 DIAGNOSIS — E669 Obesity, unspecified: Secondary | ICD-10-CM | POA: Diagnosis present

## 2021-03-12 DIAGNOSIS — Z20822 Contact with and (suspected) exposure to covid-19: Secondary | ICD-10-CM | POA: Diagnosis not present

## 2021-03-12 DIAGNOSIS — D72829 Elevated white blood cell count, unspecified: Secondary | ICD-10-CM | POA: Diagnosis not present

## 2021-03-12 DIAGNOSIS — K449 Diaphragmatic hernia without obstruction or gangrene: Secondary | ICD-10-CM | POA: Diagnosis not present

## 2021-03-12 DIAGNOSIS — K259 Gastric ulcer, unspecified as acute or chronic, without hemorrhage or perforation: Secondary | ICD-10-CM | POA: Diagnosis present

## 2021-03-12 DIAGNOSIS — K257 Chronic gastric ulcer without hemorrhage or perforation: Secondary | ICD-10-CM | POA: Diagnosis not present

## 2021-03-12 DIAGNOSIS — R7303 Prediabetes: Secondary | ICD-10-CM | POA: Diagnosis present

## 2021-03-12 DIAGNOSIS — E441 Mild protein-calorie malnutrition: Secondary | ICD-10-CM | POA: Diagnosis present

## 2021-03-12 DIAGNOSIS — Z8041 Family history of malignant neoplasm of ovary: Secondary | ICD-10-CM

## 2021-03-12 DIAGNOSIS — Z833 Family history of diabetes mellitus: Secondary | ICD-10-CM

## 2021-03-12 DIAGNOSIS — Z79890 Hormone replacement therapy (postmenopausal): Secondary | ICD-10-CM

## 2021-03-12 DIAGNOSIS — E8809 Other disorders of plasma-protein metabolism, not elsewhere classified: Secondary | ICD-10-CM | POA: Diagnosis not present

## 2021-03-12 DIAGNOSIS — K2100 Gastro-esophageal reflux disease with esophagitis, without bleeding: Secondary | ICD-10-CM | POA: Diagnosis not present

## 2021-03-12 DIAGNOSIS — R739 Hyperglycemia, unspecified: Secondary | ICD-10-CM | POA: Diagnosis present

## 2021-03-12 DIAGNOSIS — E869 Volume depletion, unspecified: Secondary | ICD-10-CM | POA: Diagnosis not present

## 2021-03-12 DIAGNOSIS — R680 Hypothermia, not associated with low environmental temperature: Secondary | ICD-10-CM | POA: Diagnosis present

## 2021-03-12 DIAGNOSIS — Z96651 Presence of right artificial knee joint: Secondary | ICD-10-CM | POA: Diagnosis present

## 2021-03-12 DIAGNOSIS — R944 Abnormal results of kidney function studies: Secondary | ICD-10-CM | POA: Diagnosis present

## 2021-03-12 DIAGNOSIS — D509 Iron deficiency anemia, unspecified: Secondary | ICD-10-CM

## 2021-03-12 DIAGNOSIS — D519 Vitamin B12 deficiency anemia, unspecified: Secondary | ICD-10-CM | POA: Diagnosis not present

## 2021-03-12 DIAGNOSIS — D539 Nutritional anemia, unspecified: Secondary | ICD-10-CM

## 2021-03-12 DIAGNOSIS — Z6837 Body mass index (BMI) 37.0-37.9, adult: Secondary | ICD-10-CM

## 2021-03-12 DIAGNOSIS — I5031 Acute diastolic (congestive) heart failure: Secondary | ICD-10-CM | POA: Diagnosis not present

## 2021-03-12 DIAGNOSIS — K2950 Unspecified chronic gastritis without bleeding: Secondary | ICD-10-CM | POA: Diagnosis not present

## 2021-03-12 DIAGNOSIS — I10 Essential (primary) hypertension: Secondary | ICD-10-CM | POA: Diagnosis not present

## 2021-03-12 DIAGNOSIS — I4819 Other persistent atrial fibrillation: Secondary | ICD-10-CM | POA: Diagnosis not present

## 2021-03-12 DIAGNOSIS — Z79899 Other long term (current) drug therapy: Secondary | ICD-10-CM

## 2021-03-12 DIAGNOSIS — I272 Pulmonary hypertension, unspecified: Secondary | ICD-10-CM | POA: Diagnosis not present

## 2021-03-12 DIAGNOSIS — K2970 Gastritis, unspecified, without bleeding: Secondary | ICD-10-CM | POA: Diagnosis not present

## 2021-03-12 DIAGNOSIS — K921 Melena: Secondary | ICD-10-CM | POA: Diagnosis not present

## 2021-03-12 DIAGNOSIS — N281 Cyst of kidney, acquired: Secondary | ICD-10-CM | POA: Diagnosis not present

## 2021-03-12 DIAGNOSIS — R0789 Other chest pain: Secondary | ICD-10-CM | POA: Diagnosis not present

## 2021-03-12 DIAGNOSIS — Z8249 Family history of ischemic heart disease and other diseases of the circulatory system: Secondary | ICD-10-CM

## 2021-03-12 DIAGNOSIS — R079 Chest pain, unspecified: Secondary | ICD-10-CM | POA: Diagnosis not present

## 2021-03-12 DIAGNOSIS — K2210 Ulcer of esophagus without bleeding: Secondary | ICD-10-CM | POA: Diagnosis not present

## 2021-03-12 DIAGNOSIS — Z83438 Family history of other disorder of lipoprotein metabolism and other lipidemia: Secondary | ICD-10-CM

## 2021-03-12 DIAGNOSIS — Z803 Family history of malignant neoplasm of breast: Secondary | ICD-10-CM | POA: Diagnosis not present

## 2021-03-12 DIAGNOSIS — Z823 Family history of stroke: Secondary | ICD-10-CM

## 2021-03-12 DIAGNOSIS — Z90710 Acquired absence of both cervix and uterus: Secondary | ICD-10-CM

## 2021-03-12 DIAGNOSIS — E039 Hypothyroidism, unspecified: Secondary | ICD-10-CM | POA: Diagnosis not present

## 2021-03-12 DIAGNOSIS — K920 Hematemesis: Secondary | ICD-10-CM | POA: Diagnosis present

## 2021-03-12 DIAGNOSIS — I517 Cardiomegaly: Secondary | ICD-10-CM | POA: Diagnosis not present

## 2021-03-12 DIAGNOSIS — Z885 Allergy status to narcotic agent status: Secondary | ICD-10-CM

## 2021-03-12 DIAGNOSIS — E782 Mixed hyperlipidemia: Secondary | ICD-10-CM | POA: Diagnosis present

## 2021-03-12 DIAGNOSIS — K922 Gastrointestinal hemorrhage, unspecified: Secondary | ICD-10-CM | POA: Diagnosis not present

## 2021-03-12 DIAGNOSIS — E860 Dehydration: Secondary | ICD-10-CM | POA: Diagnosis not present

## 2021-03-12 DIAGNOSIS — K219 Gastro-esophageal reflux disease without esophagitis: Secondary | ICD-10-CM | POA: Diagnosis not present

## 2021-03-12 DIAGNOSIS — R0602 Shortness of breath: Secondary | ICD-10-CM | POA: Diagnosis not present

## 2021-03-12 DIAGNOSIS — I4891 Unspecified atrial fibrillation: Secondary | ICD-10-CM | POA: Diagnosis not present

## 2021-03-12 DIAGNOSIS — I5033 Acute on chronic diastolic (congestive) heart failure: Secondary | ICD-10-CM

## 2021-03-12 DIAGNOSIS — Z8719 Personal history of other diseases of the digestive system: Secondary | ICD-10-CM

## 2021-03-12 DIAGNOSIS — I959 Hypotension, unspecified: Secondary | ICD-10-CM | POA: Diagnosis not present

## 2021-03-12 DIAGNOSIS — T829XXA Unspecified complication of cardiac and vascular prosthetic device, implant and graft, initial encounter: Secondary | ICD-10-CM

## 2021-03-12 DIAGNOSIS — Z85828 Personal history of other malignant neoplasm of skin: Secondary | ICD-10-CM

## 2021-03-12 LAB — CBC WITH DIFFERENTIAL/PLATELET
Abs Immature Granulocytes: 0.06 10*3/uL (ref 0.00–0.07)
Basophils Absolute: 0 10*3/uL (ref 0.0–0.1)
Basophils Relative: 0 %
Eosinophils Absolute: 0 10*3/uL (ref 0.0–0.5)
Eosinophils Relative: 0 %
HCT: 29 % — ABNORMAL LOW (ref 36.0–46.0)
Hemoglobin: 9 g/dL — ABNORMAL LOW (ref 12.0–15.0)
Immature Granulocytes: 1 %
Lymphocytes Relative: 7 %
Lymphs Abs: 0.9 10*3/uL (ref 0.7–4.0)
MCH: 33 pg (ref 26.0–34.0)
MCHC: 31 g/dL (ref 30.0–36.0)
MCV: 106.2 fL — ABNORMAL HIGH (ref 80.0–100.0)
Monocytes Absolute: 0.4 10*3/uL (ref 0.1–1.0)
Monocytes Relative: 3 %
Neutro Abs: 11.7 10*3/uL — ABNORMAL HIGH (ref 1.7–7.7)
Neutrophils Relative %: 89 %
Platelets: 218 10*3/uL (ref 150–400)
RBC: 2.73 MIL/uL — ABNORMAL LOW (ref 3.87–5.11)
RDW: 13.7 % (ref 11.5–15.5)
WBC: 13.1 10*3/uL — ABNORMAL HIGH (ref 4.0–10.5)
nRBC: 0 % (ref 0.0–0.2)

## 2021-03-12 LAB — COMPREHENSIVE METABOLIC PANEL
ALT: 18 U/L (ref 0–44)
AST: 20 U/L (ref 15–41)
Albumin: 3.2 g/dL — ABNORMAL LOW (ref 3.5–5.0)
Alkaline Phosphatase: 41 U/L (ref 38–126)
Anion gap: 7 (ref 5–15)
BUN: 72 mg/dL — ABNORMAL HIGH (ref 8–23)
CO2: 28 mmol/L (ref 22–32)
Calcium: 8.5 mg/dL — ABNORMAL LOW (ref 8.9–10.3)
Chloride: 100 mmol/L (ref 98–111)
Creatinine, Ser: 0.93 mg/dL (ref 0.44–1.00)
GFR, Estimated: >60 mL/min (ref >60)
Glucose, Bld: 200 mg/dL — ABNORMAL HIGH (ref 70–99)
Potassium: 4.7 mmol/L (ref 3.5–5.1)
Sodium: 135 mmol/L (ref 135–145)
Total Bilirubin: 1 mg/dL (ref 0.3–1.2)
Total Protein: 5.8 g/dL — ABNORMAL LOW (ref 6.5–8.1)

## 2021-03-12 LAB — RESP PANEL BY RT-PCR (FLU A&B, COVID) ARPGX2
Influenza A by PCR: NEGATIVE
Influenza B by PCR: NEGATIVE
SARS Coronavirus 2 by RT PCR: NEGATIVE

## 2021-03-12 LAB — PREPARE RBC (CROSSMATCH)

## 2021-03-12 LAB — PROTIME-INR
INR: 1.2 (ref 0.8–1.2)
Prothrombin Time: 15.2 seconds (ref 11.4–15.2)

## 2021-03-12 MED ORDER — SODIUM CHLORIDE 0.9% FLUSH
10.0000 mL | Freq: Two times a day (BID) | INTRAVENOUS | Status: DC
  Administered 2021-03-12 – 2021-03-14 (×4): 10 mL
  Administered 2021-03-15: 12:00:00 40 mL
  Administered 2021-03-15 – 2021-03-20 (×9): 10 mL

## 2021-03-12 MED ORDER — IOHEXOL 350 MG/ML SOLN
100.0000 mL | Freq: Once | INTRAVENOUS | Status: AC | PRN
  Administered 2021-03-12: 100 mL via INTRAVENOUS

## 2021-03-12 MED ORDER — PANTOPRAZOLE 80MG IVPB - SIMPLE MED
80.0000 mg | Freq: Once | INTRAVENOUS | Status: AC
  Administered 2021-03-12: 80 mg via INTRAVENOUS
  Filled 2021-03-12: qty 100

## 2021-03-12 MED ORDER — SODIUM CHLORIDE 0.9% FLUSH: 10.0000 mL | INTRAVENOUS | Status: DC | PRN

## 2021-03-12 MED ORDER — PANTOPRAZOLE INFUSION (NEW) - SIMPLE MED
8.0000 mg/h | INTRAVENOUS | Status: DC
  Administered 2021-03-12 – 2021-03-14 (×3): 8 mg/h via INTRAVENOUS
  Filled 2021-03-12 (×2): qty 100
  Filled 2021-03-12: qty 80
  Filled 2021-03-12 (×2): qty 100
  Filled 2021-03-12: qty 80

## 2021-03-12 MED ORDER — SODIUM CHLORIDE 0.9 % IV BOLUS
1000.0000 mL | Freq: Once | INTRAVENOUS | Status: AC
  Administered 2021-03-12: 1000 mL via INTRAVENOUS

## 2021-03-12 MED ORDER — GLUCERNA SHAKE PO LIQD
237.0000 mL | Freq: Three times a day (TID) | ORAL | Status: DC
  Administered 2021-03-17: 237 mL via ORAL

## 2021-03-12 MED ORDER — ONDANSETRON HCL 4 MG/2ML IJ SOLN
4.0000 mg | Freq: Once | INTRAMUSCULAR | Status: AC
  Administered 2021-03-12: 4 mg via INTRAVENOUS
  Filled 2021-03-12: qty 2

## 2021-03-12 MED ORDER — SODIUM CHLORIDE 0.9 % IV SOLN
10.0000 mL/h | Freq: Once | INTRAVENOUS | Status: AC
  Administered 2021-03-12: 10 mL/h via INTRAVENOUS

## 2021-03-12 MED ORDER — SODIUM CHLORIDE 0.9 % IV SOLN: Freq: Once | INTRAVENOUS | Status: AC

## 2021-03-12 NOTE — ED Provider Notes (Signed)
Emergency Medicine Provider Triage Evaluation Note  Amanda Browning , a 75 y.o. female  was evaluated in triage.  Pt complains of vomiting blood and passing Browning stool, shortness of breath, throat pain  Review of Systems  Positive: Throat pain, vomiting blood, passing Browning stool, shortness of breath Negative: No history of GI bleeds, hematuria, no fever or chills  Physical Exam  There were no vitals taken for this visit. Gen:   Awake, no distress   Resp:  Normal effort  MSK:   Moves extremities without difficulty  Other:  Patient appears to be very pale  Medical Decision Making  Medically screening exam initiated at 12:27 PM.  Appropriate orders placed.  Amanda Browning was informed that the remainder of the evaluation will be completed by another provider, this initial triage assessment does not replace that evaluation, and the importance of remaining in the ED until their evaluation is complete.     Amanda Starks, PA-C 03/12/21 1228    Amanda Dark, MD 03/12/21 1450

## 2021-03-12 NOTE — ED Provider Notes (Signed)
[facility] Emergency Department Provider Note  Time seen: 1:41 PM  I have reviewed the triage vital signs and the nursing notes.   HISTORY  Chief Complaint GI Problem and GI Bleeding   HPI Amanda Browning is a 75 y.o. female with a past medical history of anemia, gastric reflux, hypertension, hyperlipidemia, obesity, presents to the emergency department with dark stool and dark vomit.  According to the patient around 3:00 this morning she developed nausea vomiting with dark vomit.  States she is also had several dark bowel movements today as well.  States yesterday she felt well.  Patient denies any blood thinner use, denies any history of GI bleeds.  States it has been approximately 5 years since her last colonoscopy.  Patient denies any abdominal pain denies any fever.  Patient found to be quite hypotensive 84/56 on arrival.  Nauseated spitting into an emesis bag occasionally with dark sputum mixed in with saliva.   Past Medical History:  Diagnosis Date   Anemia    Chicken pox    Colon polyps    Complication of anesthesia    doesn't take much anesthesia to put pt to sleep, slow to wake up   DDD (degenerative disc disease), cervical 02/05/13   C5-C6 and C6-C7   GERD (gastroesophageal reflux disease)    History of chicken pox    Hypercholesterolemia    Hyperlipidemia    Hypertension    Hypothyroidism    Migraines    PONV (postoperative nausea and vomiting)    Squamous cell skin cancer     Patient Active Problem List   Diagnosis Date Noted   Severe obesity (BMI 35.0-39.9) with comorbidity ([place]) 10/18/2020   Hyperglycemia 12/16/2019   Chest pain 02/17/2019   Hypomagnesemia 01/31/2018   Right shoulder pain 01/31/2018   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   Primary osteoarthritis of right knee 01/08/2017   Right elbow pain 04/03/2016   Stress 04/20/2015   Health care maintenance 03/19/2015   Difficulty sleeping 12/14/2014   Fatigue 10/07/2014    SOB (shortness of breath) on exertion 10/07/2014   Skin lesion 10/07/2014   Estrogen deficiency 01/23/2014   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   Leg cramps 03/19/2013   Pain, neck 02/05/2013   Low back pain 01/15/2013   Right knee pain 01/15/2013   Hypertension 03/19/2012   Hypercholesterolemia 03/19/2012   Anemia 03/19/2012   Hypothyroidism 03/19/2012   GERD (gastroesophageal reflux disease) 03/19/2012    Past Surgical History:  Procedure Laterality Date   BREAST BIOPSY Right 2012   stereotactic   COLONOSCOPY WITH PROPOFOL N/A 02/23/2018   Procedure: COLONOSCOPY WITH PROPOFOL;  Surgeon: Manya Silvas, MD;  Location: [facility] ENDOSCOPY;  Service: Endoscopy;  Laterality: N/A;   KNEE ARTHROPLASTY Right 04/17/2017   Procedure: COMPUTER ASSISTED TOTAL KNEE ARTHROPLASTY;  Surgeon: Dereck Leep, MD;  Location: [facility] ORS;  Service: Orthopedics;  Laterality: Right;   SKIN BIOPSY     VAGINAL HYSTERECTOMY  1994   ovaries not removed   WISDOM TOOTH EXTRACTION      Prior to Admission medications   Medication Sig Start Date End Date Taking? Authorizing Provider  acetaminophen (TYLENOL) 325 MG tablet Take 650 mg by mouth 4 (four) times daily as needed.    [provider]  albuterol (VENTOLIN HFA) 108 (90 Base) MCG/ACT inhaler Inhale 2 puffs into the lungs every 6 (six) hours as needed for wheezing or shortness of breath. 03/10/21   Einar Pheasant, MD  amLODipine (NORVASC) 5 MG tablet Take 1 tablet (5 mg total) by mouth daily. 10/16/20   Einar Pheasant, MD  aspirin EC 81 MG tablet Take by mouth.    [provider]  cetirizine (ZYRTEC) 10 MG tablet Take 10 mg by mouth daily.    [provider]  ferrous sulfate 325 (65 FE) MG tablet Take by mouth.    [provider]  glucosamine-chondroitin 500-400 MG tablet Take 1 tablet by mouth daily.    [provider]  levothyroxine (SYNTHROID) 50 MCG tablet TAKE 1 TABLET EVERY DAY  BEFORE BREAKFAST 09/11/20   Einar Pheasant, MD  lisinopril-hydrochlorothiazide (ZESTORETIC) 20-25 MG tablet TAKE 1 TABLET EVERY DAY 08/21/20   Einar Pheasant, MD  lovastatin (MEVACOR) 40 MG tablet TAKE 1 TABLET AT BEDTIME 07/06/20   Einar Pheasant, MD  magnesium oxide (MAG-OX) 400 MG tablet Take 1 tablet (400 mg total) by mouth 2 (two) times daily. 01/24/17   Einar Pheasant, MD  Multiple Vitamin (MULTIVITAMIN) tablet Take 1 tablet by mouth daily.    [provider]  omeprazole (PRILOSEC) 20 MG capsule TAKE 1 CAPSULE TWICE DAILY BEFORE MEALS 09/21/20   Einar Pheasant, MD  sertraline (ZOLOFT) 50 MG tablet TAKE 1/2 TABLET DAILY FOR 1 WEEK, THEN TAKE 1 TABLET DAILY 09/22/20   Einar Pheasant, MD  vitamin B-12 (CYANOCOBALAMIN) 1000 MCG tablet Take 1 tablet by mouth daily.    [provider]    Allergies  Allergen Reactions   Codeine Other (See Comments)    Can not function Other reaction(s): Other (See Comments) Altered mental status- "Cannot function" Can not function Can not function    Family History  Problem Relation Age of Onset   Heart disease Mother    Stroke Mother    Hypertension Mother    Heart attack Mother    Heart disease Father        enlarged heart   Stroke Father    Hypertension Father    Hyperlipidemia Father    Heart attack Father    Ovarian cancer Paternal Aunt    Breast cancer Sister 80   Cancer Sister        Breast Cancer   Diabetes type II Brother    Hypertension Brother    Colon cancer Neg Hx    Basal cell carcinoma Neg Hx    Melanoma Neg Hx    Squamous cell carcinoma Neg Hx     Social History Social History   Tobacco Use   Smoking status: Never   Smokeless tobacco: Never  Vaping Use   Vaping Use: Never used  Substance Use Topics   Alcohol use: No    Alcohol/week: 0.0 standard drinks   Drug use: No    Review of Systems Constitutional: Negative for fever. Cardiovascular: Negative for chest pain. Respiratory: Negative for  shortness of breath. Gastrointestinal: Negative for abdominal pain.  Positive for nausea and vomiting dark vomit, positive for black stool.  Symptoms began 3:00 this morning Genitourinary: Negative for urinary compaints Musculoskeletal: Negative for musculoskeletal complaints Neurological: Negative for headache All other ROS negative  ____________________________________________   PHYSICAL EXAM:  VITAL SIGNS: ED Triage Vitals  Enc Vitals Group     BP 03/12/21 1229 (!) 84/56     Pulse Rate 03/12/21 1229 93     Resp 03/12/21 1229 16     Temp 03/12/21 1229 (!) 95 F (35 C)     Temp Source 03/12/21 1229 Oral     SpO2 03/12/21 1229 97 %  Weight 03/12/21 1303 210 lb (95.3 kg)     Height 03/12/21 1303 5\' 3"  (1.6 m)     Head Circumference --      Peak Flow --      Pain Score 03/12/21 1302 4     Pain Loc --      Pain Edu? --      Excl. in [place]? --    Constitutional: Alert and oriented. Well appearing and in no distress. Eyes: Normal exam ENT      Head: Normocephalic and atraumatic.      Mouth/Throat: Mucous membranes are moist. Cardiovascular: Normal rate, regular rhythm. Respiratory: Normal respiratory effort without tachypnea nor retractions. Breath sounds are clear  Gastrointestinal: Soft and nontender. No distention.  Musculoskeletal: Nontender with normal range of motion in all extremities. No lower extremity tenderness or edema. Neurologic:  Normal speech and language. No gross focal neurologic deficits  Skin:  Skin is warm, dry and intact.  Psychiatric: Mood and affect are normal.   ____________________________________________   RADIOLOGY  ct pending  ____________________________________________   INITIAL IMPRESSION / ASSESSMENT AND PLAN / ED COURSE  Pertinent labs & imaging results that were available during my care of the patient were reviewed by me and considered in my medical decision making (see chart for details).   Patient presents to the emergency  department for nausea vomiting dark stool with dark vomit since 3:00 this morning.  Here patient is quite weak and pale appearing, had large loose bowel movement of black stool consistent with melena strongly guaiac positive.  Is spitting saliva mixed with dark sputum consistent with likely upper GI bleed.  No history of GI bleed previously.  Patient denies any use of ibuprofen or aspirin, states she only uses Tylenol.  Denies any alcohol use.  No blood thinners no prior GI bleeds.  We will start the patient on Protonix bolus and infusion.  Patient's hemoglobin is 9 however this is down from 12.8, given the patient's hypotension vomiting and large black bowel movement in the emergency department suspect fairly brisk bleed we will order 1 unit of PRBCs while awaiting further lab work.  We will discussed with GI medicine, ultimately patient will be admitted to the hospitalist service.  Patient's blood pressure improving currently 99/69 on last check.  We will admit to the hospital service.  Amanda Browning was evaluated in Emergency Department on 03/12/2021 for the symptoms described in the history of present illness. She was evaluated in the context of the global COVID-19 pandemic, which necessitated consideration that the patient might be at risk for infection with the SARS-CoV-2 virus that causes COVID-19. Institutional protocols and algorithms that pertain to the evaluation of patients at risk for COVID-19 are in a state of rapid change based on information released by regulatory bodies including the CDC and federal and state organizations. These policies and algorithms were followed during the patient's care in the ED.  CRITICAL CARE Performed by: [name]   Total critical care time: 30 minutes  Critical care time was exclusive of separately billable procedures and treating other patients.  Critical care was necessary to treat or prevent imminent or life-threatening  deterioration.  Critical care was time spent personally by me on the following activities: development of treatment plan with patient and/or surrogate as well as nursing, discussions with consultants, evaluation of patient's response to treatment, examination of patient, obtaining history from patient or surrogate, ordering and performing treatments and interventions, ordering and review of laboratory studies,  ordering and review of radiographic studies, pulse oximetry and re-evaluation of patient's condition.  ____________________________________________   FINAL CLINICAL IMPRESSION(S) / ED DIAGNOSES  Upper GI bleed   [name], MD 03/12/21 1516

## 2021-03-12 NOTE — ED Notes (Signed)
Report received from Crystal, RN

## 2021-03-12 NOTE — ED Notes (Signed)
Pt cleaned. Pt had large amount of tarry stool in her brief. Pt placed on purewick.

## 2021-03-12 NOTE — ED Triage Notes (Signed)
Pt started vomiting blood and passing dark stool from her rectum, pt is c/o sob and is very pale

## 2021-03-12 NOTE — ED Notes (Signed)
Paused blood due to pt having increased tachycardia. MD notified.

## 2021-03-12 NOTE — H&P (Signed)
History and Physical  Amanda Browning INO:[id number] DOB: [birth year] DOA: 03/12/2021  Referring physician: [name], MD PCP: Einar Pheasant, MD  Patient coming from: Home  Chief Complaint: Vomiting of blood and black stool  HPI: Amanda Browning is a 75 y.o. obese female with medical history significant for GERD, essential failure, hyperlipidemia, hypothyroidism, anemia (Iron deficiency and B12 deficiency) who presents to the emergency department due to having dark-colored vomiting and dark stool started around 3 AM this morning.  Patient complained of nausea and vomiting of dark colored liquid and several episodes of dark stools on bowel movements this morning, this was associated with some shortness of breath, but she denies abdominal pain, fever, chills, headache.  EMS was activated and patient was taken to the ED for further evaluation and management.  ED Course:  In the emergency department, she was hypothermic, intermittently tachycardic and BP was initially soft at 84/56.  O2 sats was 97% on room air.  Work-up in the ED showed leukocytosis and macrocytic anemia.  BMP showed elevated BUN at 72 and hyperglycemia, albumin 3.2.  Influenza A, B, SARS coronavirus 2 was negative. Chest x-ray was suggestive of large hiatal hernia CT angiography of abdomen and pelvis without and with contrast showed no active arterial extravasation or other acute findings.  Large hiatal hernia noted. IV hydration was provided, patient was started on IV Protonix drip, 1 unit of PRBC was ordered.  Gastroenterology was consulted and plans for endoscopy in the morning per ED physician.  Hospitalist was asked to admit patient for further evaluation and management.  Review of Systems: Constitutional: Negative for chills and fever.  HENT: Negative for ear pain and sore throat.   Eyes: Negative for pain and visual disturbance.  Respiratory: Positive for transitory shortness of breath.  Negative for  cough Cardiovascular: Negative for chest pain and palpitations.  Gastrointestinal: Positive for nausea, vomiting of coffee-ground liquid and passage of dark stool.  Negative for abdominal pain  Endocrine: Negative for polyphagia and polyuria.  Genitourinary: Negative for decreased urine volume, dysuria, enuresis Musculoskeletal: Negative for arthralgias and back pain.  Skin: Negative for color change and rash.  Allergic/Immunologic: Negative for immunocompromised state.  Neurological: Negative for tremors, syncope, speech difficulty, weakness, light-headedness and headaches.  Hematological: Does not bruise/bleed easily.  All other systems reviewed and are negative   Past Medical History:  Diagnosis Date   Anemia    Chicken pox    Colon polyps    Complication of anesthesia    doesn't take much anesthesia to put pt to sleep, slow to wake up   DDD (degenerative disc disease), cervical 02/05/13   C5-C6 and C6-C7   GERD (gastroesophageal reflux disease)    History of chicken pox    Hypercholesterolemia    Hyperlipidemia    Hypertension    Hypothyroidism    Migraines    PONV (postoperative nausea and vomiting)    Squamous cell skin cancer    Past Surgical History:  Procedure Laterality Date   BREAST BIOPSY Right 2012   stereotactic   COLONOSCOPY WITH PROPOFOL N/A 02/23/2018   Procedure: COLONOSCOPY WITH PROPOFOL;  Surgeon: Manya Silvas, MD;  Location: [facility] ENDOSCOPY;  Service: Endoscopy;  Laterality: N/A;   KNEE ARTHROPLASTY Right 04/17/2017   Procedure: COMPUTER ASSISTED TOTAL KNEE ARTHROPLASTY;  Surgeon: Dereck Leep, MD;  Location: [facility] ORS;  Service: Orthopedics;  Laterality: Right;   SKIN BIOPSY     VAGINAL HYSTERECTOMY  1994   ovaries not removed  WISDOM TOOTH EXTRACTION      Social History:  reports that she has never smoked. She has never used smokeless tobacco. She reports that she does not drink alcohol and does not use drugs.   Allergies  Allergen  Reactions   Codeine Other (See Comments)    Can not function Other reaction(s): Other (See Comments) Altered mental status- "Cannot function" Can not function Can not function    Family History  Problem Relation Age of Onset   Heart disease Mother    Stroke Mother    Hypertension Mother    Heart attack Mother    Heart disease Father        enlarged heart   Stroke Father    Hypertension Father    Hyperlipidemia Father    Heart attack Father    Ovarian cancer Paternal Aunt    Breast cancer Sister 48   Cancer Sister        Breast Cancer   Diabetes type II Brother    Hypertension Brother    Colon cancer Neg Hx    Basal cell carcinoma Neg Hx    Melanoma Neg Hx    Squamous cell carcinoma Neg Hx     Prior to Admission medications   Medication Sig Start Date End Date Taking? Authorizing Provider  acetaminophen (TYLENOL) 325 MG tablet Take 650 mg by mouth 4 (four) times daily as needed.    [provider]  albuterol (VENTOLIN HFA) 108 (90 Base) MCG/ACT inhaler Inhale 2 puffs into the lungs every 6 (six) hours as needed for wheezing or shortness of breath. 03/10/21   Einar Pheasant, MD  amLODipine (NORVASC) 5 MG tablet Take 1 tablet (5 mg total) by mouth daily. 10/16/20   Einar Pheasant, MD  aspirin EC 81 MG tablet Take by mouth.    [provider]  cetirizine (ZYRTEC) 10 MG tablet Take 10 mg by mouth daily.    [provider]  ferrous sulfate 325 (65 FE) MG tablet Take by mouth.    [provider]  glucosamine-chondroitin 500-400 MG tablet Take 1 tablet by mouth daily.    [provider]  levothyroxine (SYNTHROID) 50 MCG tablet TAKE 1 TABLET EVERY DAY BEFORE BREAKFAST 09/11/20   Einar Pheasant, MD  lisinopril-hydrochlorothiazide (ZESTORETIC) 20-25 MG tablet TAKE 1 TABLET EVERY DAY 08/21/20   Einar Pheasant, MD  lovastatin (MEVACOR) 40 MG tablet TAKE 1 TABLET AT BEDTIME 07/06/20   Einar Pheasant, MD  magnesium oxide (MAG-OX) 400 MG tablet  Take 1 tablet (400 mg total) by mouth 2 (two) times daily. 01/24/17   Einar Pheasant, MD  Multiple Vitamin (MULTIVITAMIN) tablet Take 1 tablet by mouth daily.    [provider]  omeprazole (PRILOSEC) 20 MG capsule TAKE 1 CAPSULE TWICE DAILY BEFORE MEALS 09/21/20   Einar Pheasant, MD  sertraline (ZOLOFT) 50 MG tablet TAKE 1/2 TABLET DAILY FOR 1 WEEK, THEN TAKE 1 TABLET DAILY 09/22/20   Einar Pheasant, MD  vitamin B-12 (CYANOCOBALAMIN) 1000 MCG tablet Take 1 tablet by mouth daily.    [provider]    Physical Exam: BP (!) 97/41   Pulse (!) 115   Temp 98 F (36.7 C) (Oral)   Resp 16   Ht 5\' 3"  (1.6 m)   Wt 95.3 kg   SpO2 95%   BMI 37.20 kg/m   General: 75 y.o. year-old female well developed well nourished in no acute distress.  Alert and oriented x3. HEENT: Dry mucous membrane.  NCAT, EOMI Neck:  Supple, trachea medial Cardiovascular: Regular rate and rhythm with no rubs or gallops.  No thyromegaly or JVD noted.  No lower extremity edema. 2/4 pulses in all 4 extremities. Respiratory: Clear to auscultation with no wheezes or rales. Good inspiratory effort. Abdomen: Soft, nontender nondistended with normal bowel sounds x4 quadrants. Muskuloskeletal: No cyanosis, clubbing or edema noted bilaterally Neuro: CN II-XII intact, strength 5/5 x 4, sensation, reflexes intact Skin: No ulcerative lesions noted or rashes Psychiatry: Judgement and insight appear normal. Mood is appropriate for condition and setting          Labs on Admission:  Basic Metabolic Panel: Recent Labs  Lab 03/12/21 1309  NA 135  K 4.7  CL 100  CO2 28  GLUCOSE 200*  BUN 72*  CREATININE 0.93  CALCIUM 8.5*   Liver Function Tests: Recent Labs  Lab 03/12/21 1309  AST 20  ALT 18  ALKPHOS 41  BILITOT 1.0  PROT 5.8*  ALBUMIN 3.2*   No results for input(s): LIPASE, AMYLASE in the last 168 hours. No results for input(s): AMMONIA in the last 168 hours. CBC: Recent Labs  Lab 03/12/21 1309   WBC 13.1*  NEUTROABS 11.7*  HGB 9.0*  HCT 29.0*  MCV 106.2*  PLT 218   Cardiac Enzymes: No results for input(s): CKTOTAL, CKMB, CKMBINDEX, TROPONINI in the last 168 hours.  BNP (last 3 results) No results for input(s): BNP in the last 8760 hours.  ProBNP (last 3 results) No results for input(s): PROBNP in the last 8760 hours.  CBG: No results for input(s): GLUCAP in the last 168 hours.  Radiological Exams on Admission: DG Chest Portable 1 View  Result Date: 03/12/2021 CLINICAL DATA:  75 year old female with chest pain, hematemesis, and melena. EXAM: PORTABLE CHEST - 1 VIEW COMPARISON:  None. FINDINGS: The mediastinal contours are within normal limits. Mild cardiomegaly. Air-fluid level projects over the superior cardiac silhouette the lungs are clear bilaterally without evidence of focal consolidation, pleural effusion, or pneumothorax. No acute osseous abnormality. IMPRESSION: Suggestion of large hiatal hernia. Consider two-view chest radiographs versus CT chest, abdomen, and pelvis for further characterization as clinically indicated. Electronically Signed   By: Ruthann Cancer M.D.   On: 03/12/2021 13:47   CT ANGIO GI BLEED  Result Date: 03/12/2021 CLINICAL DATA:  Hematemesis, melena, shortness of breath EXAM: CTA ABDOMEN AND PELVIS WITHOUT AND WITH CONTRAST TECHNIQUE: Multidetector CT imaging of the abdomen and pelvis was performed using the standard protocol during bolus administration of intravenous contrast. Multiplanar reconstructed images and MIPs were obtained and reviewed to evaluate the vascular anatomy. CONTRAST:  165mL OMNIPAQUE IOHEXOL 350 MG/ML SOLN COMPARISON:  None. FINDINGS: VASCULAR Aorta: Mild scattered calcified plaque. No aneurysm, dissection, or stenosis. Celiac: Patent without evidence of aneurysm, dissection, vasculitis or significant stenosis. SMA: Patent without evidence of aneurysm, dissection, vasculitis or significant stenosis. Replaced right hepatic  arterial supply, an anatomic variant. Renals: Both renal arteries are patent without evidence of aneurysm, dissection, vasculitis, fibromuscular dysplasia or significant stenosis. IMA: Patent without evidence of aneurysm, dissection, vasculitis or significant stenosis. Inflow: Patent without evidence of aneurysm, dissection, vasculitis or significant stenosis. Proximal Outflow: Bilateral common femoral and visualized portions of the superficial and profunda femoral arteries are patent without evidence of aneurysm, dissection, vasculitis or significant stenosis. Veins: No obvious venous abnormality within the limitations of this arterial phase study. Review of the MIP images confirms the above findings. NON-VASCULAR Lower chest: Coronary calcifications.  Large hiatal hernia. Hepatobiliary: No focal liver abnormality is seen. No  gallstones, gallbladder wall thickening, or biliary dilatation. Pancreas: Unremarkable. No pancreatic ductal dilatation or surrounding inflammatory changes. Spleen: Normal in size without focal abnormality. Small scattered calcified granulomas. Adrenals/Urinary Tract: Adrenal glands unremarkable. 1.4 cm probable cyst, lower pole right kidney. 8.1 cm cyst, upper pole left kidney. No urolithiasis or hydronephrosis. Urinary bladder physiologically distended. Stomach/Bowel: Large hiatal hernia involving the gastric fundus, cardia, and portions of the body. The small bowel is decompressed, unremarkable. Normal appendix. The colon is nondilated, unremarkable. No evidence of active extravasation into the bowel. Lymphatic: No abdominal or pelvic adenopathy. Reproductive: Status post hysterectomy. No adnexal masses. Other: No ascites.  No free air. Musculoskeletal: Multilevel spondylitic changes in the lower thoracic and lumbar spine. Advanced facet DJD L5-S1 likely accounts for the grade 1 anterolisthesis at this level. Negative for fracture or acute bone abnormality. IMPRESSION: 1. No active arterial  extravasation or other acute findings. 2. Large hiatal hernia 3. Coronary and Aortic Atherosclerosis (ICD10-170.0). Electronically Signed   By: Lucrezia Europe M.D.   On: 03/12/2021 15:35    EKG: I independently viewed the EKG done and my findings are as followed: EKG was not done in the ED  Assessment/Plan Present on Admission:  GI bleed  Hyperglycemia  Principal Problem:   GI bleed Active Problems:   Macrocytic anemia   Hyperglycemia   Hiatal hernia   SIRS (systemic inflammatory response syndrome) (HCC)   Hypothermia   Leukocytosis   Hypoalbuminemia  GI bleed Patient complained of coffee-ground emesis and melena H/H= 8.0/29.0, this was 12.9/30.6 on 9/27 EGD done in 2016 showed esophageal stricture and large hiatal hernia Patient only takes aspirin at home Patient only takes aspirin 81 mg p.o. daily, she denies use of other NSAIDs, Goody powder Type and crossmatch was done 1 unit of PRBC was started while patient was in the ED, but she suddenly developed tachycardia and this was held. Gastroenterology was consulted by ED team and plans on EGD in the morning  SIRS in the setting of above Patient was tachycardic, hypothermic and WBC was elevated, however, there was no obvious concern for any acute infectious process. Continue management as described above  Hypothermia- resolved  Dehydration Continue IV hydration  Macrocytic anemia/iron deficiency anemia Vitamin B12 and folate levels will be checked Resume home vitamin B12 and ferrous sulfate status post EGD tomorrow  Hyperglycemia  A1c about a month ago was 5.9 Continue diet control  Hiatal hernia/GERD Continue Protonix  Hypoalbuminemia secondary to mild protein calorie malnutrition Protein supplement will be provided  Hypothyroidism Continue Synthroid  Essential hypertension BP meds will be held at this time due to soft BP  Mixed hyperlipidemia Continue lovastatin   DVT prophylaxis: SCD  Code Status: Full  code  Family Communication: None at bedside  Disposition Plan:  Patient is from:                        home Anticipated DC to:                   SNF or family members home Anticipated DC date:               2-3 days Anticipated DC barriers:          Patient requires inpatient management due to GI bleed requiring possible EGD  Consults called: Gastroenterology  Admission status: Inpatient    Bernadette Hoit MD [facility]  03/12/2021, 6:02 PM

## 2021-03-12 NOTE — Consult Note (Signed)
[name] , MD [address], [place], [place], [place], [address] [address], [place], [place], [place], [address] Phone: [phone]  Fax: [phone]  Consultation  Referring Provider:    ER Primary Care Physician:  Einar Pheasant, MD Primary Gastroenterologist:  [facility]          Reason for Consultation:     Melena  Date of Admission:  03/12/2021 Date of Consultation:  03/12/2021         HPI:   Amanda Browning is a 75 y.o. female previously seen and evaluated by [facility] in 04/2019 for iron deficiency anemia, EGD in 2016 showed large hiatal hernia and esophageal stricture, colonoscopy in 2019 showed hemorroids only. Plan at that time was for oral iron and follow up with Dr Alice Reichert after. Did not follow up after.    She presented to the ER a short while back with hematemesis and melena. Hb 9 grams down from 12.9 a year back. BUN 72 and creat 0.93.   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 Denies any NSAID use.She has been habing melena since yesterday . Last episode of coffee ground emesis was in the ER. Denies any excess alcohol use.    Past Medical History:  Diagnosis Date  . Anemia   . Chicken pox   . Colon polyps   . Complication of anesthesia    doesn't take much anesthesia to put pt to sleep, slow to wake up  . DDD (degenerative disc disease), cervical 02/05/13   C5-C6 and C6-C7  . GERD (gastroesophageal reflux disease)   . History of chicken pox   . Hypercholesterolemia   . Hyperlipidemia   . Hypertension   . Hypothyroidism   . Migraines   . PONV (postoperative nausea and vomiting)   . Squamous cell skin cancer     Past Surgical History:  Procedure Laterality Date  . BREAST BIOPSY Right 2012   stereotactic  . COLONOSCOPY WITH PROPOFOL N/A 02/23/2018   Procedure: COLONOSCOPY WITH PROPOFOL;  Surgeon: Manya Silvas, MD;  Location: [facility] ENDOSCOPY;  Service:  Endoscopy;  Laterality: N/A;  . KNEE ARTHROPLASTY Right 04/17/2017   Procedure: COMPUTER ASSISTED TOTAL KNEE ARTHROPLASTY;  Surgeon: Dereck Leep, MD;  Location: [facility] ORS;  Service: Orthopedics;  Laterality: Right;  . SKIN BIOPSY    . VAGINAL HYSTERECTOMY  1994   ovaries not removed  . WISDOM TOOTH EXTRACTION      Prior to Admission medications   Medication Sig Start Date End Date Taking? Authorizing Provider  acetaminophen (TYLENOL) 325 MG tablet Take 650 mg by mouth 4 (four) times daily as needed.    [provider]  albuterol (VENTOLIN HFA) 108 (90 Base) MCG/ACT inhaler Inhale 2 puffs into the lungs every 6 (six) hours as needed for wheezing or shortness of breath. 03/10/21   Einar Pheasant, MD  amLODipine (NORVASC) 5 MG tablet Take 1 tablet (5 mg total) by mouth daily. 10/16/20   Einar Pheasant, MD  aspirin EC 81 MG tablet Take by mouth.    [provider]  cetirizine (ZYRTEC) 10 MG tablet Take 10 mg by mouth daily.    [provider]  ferrous sulfate 325 (65 FE) MG tablet Take by mouth.    [provider]  glucosamine-chondroitin 500-400 MG tablet Take 1 tablet by mouth daily.    [provider]  levothyroxine (SYNTHROID) 50  MCG tablet TAKE 1 TABLET EVERY DAY BEFORE BREAKFAST 09/11/20   Einar Pheasant, MD  lisinopril-hydrochlorothiazide (ZESTORETIC) 20-25 MG tablet TAKE 1 TABLET EVERY DAY 08/21/20   Einar Pheasant, MD  lovastatin (MEVACOR) 40 MG tablet TAKE 1 TABLET AT BEDTIME 07/06/20   Einar Pheasant, MD  magnesium oxide (MAG-OX) 400 MG tablet Take 1 tablet (400 mg total) by mouth 2 (two) times daily. 01/24/17   Einar Pheasant, MD  Multiple Vitamin (MULTIVITAMIN) tablet Take 1 tablet by mouth daily.    [provider]  omeprazole (PRILOSEC) 20 MG capsule TAKE 1 CAPSULE TWICE DAILY BEFORE MEALS 09/21/20   Einar Pheasant, MD  sertraline (ZOLOFT) 50 MG tablet TAKE 1/2 TABLET DAILY FOR 1 WEEK, THEN TAKE 1 TABLET DAILY 09/22/20   Einar Pheasant, MD  vitamin B-12 (CYANOCOBALAMIN) 1000 MCG tablet Take 1 tablet by mouth daily.    [provider]    Family History  Problem Relation Age of Onset  . Heart disease Mother   . Stroke Mother   . Hypertension Mother   . Heart attack Mother   . Heart disease Father        enlarged heart  . Stroke Father   . Hypertension Father   . Hyperlipidemia Father   . Heart attack Father   . Ovarian cancer Paternal Aunt   . Breast cancer Sister 22  . Cancer Sister        Breast Cancer  . Diabetes type II Brother   . Hypertension Brother   . Colon cancer Neg Hx   . Basal cell carcinoma Neg Hx   . Melanoma Neg Hx   . Squamous cell carcinoma Neg Hx      Social History   Tobacco Use  . Smoking status: Never  . Smokeless tobacco: Never  Vaping Use  . Vaping Use: Never used  Substance Use Topics  . Alcohol use: No    Alcohol/week: 0.0 standard drinks  . Drug use: No    Allergies as of 03/12/2021 - Review Complete 03/12/2021  Allergen Reaction Noted  . Codeine Other (See Comments) 03/19/2012    Review of Systems:    All systems reviewed and negative except where noted in HPI.   Physical Exam:  Vital signs in last 24 hours: Temp:  [95 F (35 C)-97.5 F (36.4 C)] 97.5 F (36.4 C) (11/25 1706) Pulse Rate:  [93-119] 114 (11/25 1706) Resp:  [16] 16 (11/25 1706) BP: (82-107)/(56-68) 107/68 (11/25 1706) SpO2:  [94 %-97 %] 94 % (11/25 1400) Weight:  [95.3 kg] 95.3 kg (11/25 1303)   General:   Pleasant, cooperative in NAD Head:  Normocephalic and atraumatic. Eyes:   No icterus.   Conjunctiva pink. PERRLA. Ears:  Normal auditory acuity. Neck:  Supple; no masses or thyroidomegaly Lungs: Respirations even and unlabored. Lungs clear to auscultation bilaterally.   No wheezes, crackles, or rhonchi.  Heart:  Regular rate and rhythm;  Without murmur, clicks, rubs or gallops Abdomen:  Soft, nondistended, nontender. Normal bowel sounds. No appreciable masses or  hepatomegaly.  No rebound or guarding.  Neurologic:  Alert and oriented x3;  grossly normal neurologically. Skin:  Intact without significant lesions or rashes. Cervical Nodes:  No significant cervical adenopathy. Psych:  Alert and cooperative. Normal affect.  LAB RESULTS: Recent Labs    03/12/21 1309  WBC 13.1*  HGB 9.0*  HCT 29.0*  PLT 218   BMET Recent Labs    03/12/21 1309  NA 135  K 4.7  CL  100  CO2 28  GLUCOSE 200*  BUN 72*  CREATININE 0.93  CALCIUM 8.5*   LFT Recent Labs    03/12/21 1309  PROT 5.8*  ALBUMIN 3.2*  AST 20  ALT 18  ALKPHOS 41  BILITOT 1.0   PT/INR Recent Labs    03/12/21 1309  LABPROT 15.2  INR 1.2    STUDIES: DG Chest Portable 1 View  Result Date: 03/12/2021 CLINICAL DATA:  75 year old female with chest pain, hematemesis, and melena. EXAM: PORTABLE CHEST - 1 VIEW COMPARISON:  None. FINDINGS: The mediastinal contours are within normal limits. Mild cardiomegaly. Air-fluid level projects over the superior cardiac silhouette the lungs are clear bilaterally without evidence of focal consolidation, pleural effusion, or pneumothorax. No acute osseous abnormality. IMPRESSION: Suggestion of large hiatal hernia. Consider two-view chest radiographs versus CT chest, abdomen, and pelvis for further characterization as clinically indicated. Electronically Signed   By: Ruthann Cancer M.D.   On: 03/12/2021 13:47   CT ANGIO GI BLEED  Result Date: 03/12/2021 CLINICAL DATA:  Hematemesis, melena, shortness of breath EXAM: CTA ABDOMEN AND PELVIS WITHOUT AND WITH CONTRAST TECHNIQUE: Multidetector CT imaging of the abdomen and pelvis was performed using the standard protocol during bolus administration of intravenous contrast. Multiplanar reconstructed images and MIPs were obtained and reviewed to evaluate the vascular anatomy. CONTRAST:  153mL OMNIPAQUE IOHEXOL 350 MG/ML SOLN COMPARISON:  None. FINDINGS: VASCULAR Aorta: Mild scattered calcified plaque. No  aneurysm, dissection, or stenosis. Celiac: Patent without evidence of aneurysm, dissection, vasculitis or significant stenosis. SMA: Patent without evidence of aneurysm, dissection, vasculitis or significant stenosis. Replaced right hepatic arterial supply, an anatomic variant. Renals: Both renal arteries are patent without evidence of aneurysm, dissection, vasculitis, fibromuscular dysplasia or significant stenosis. IMA: Patent without evidence of aneurysm, dissection, vasculitis or significant stenosis. Inflow: Patent without evidence of aneurysm, dissection, vasculitis or significant stenosis. Proximal Outflow: Bilateral common femoral and visualized portions of the superficial and profunda femoral arteries are patent without evidence of aneurysm, dissection, vasculitis or significant stenosis. Veins: No obvious venous abnormality within the limitations of this arterial phase study. Review of the MIP images confirms the above findings. NON-VASCULAR Lower chest: Coronary calcifications.  Large hiatal hernia. Hepatobiliary: No focal liver abnormality is seen. No gallstones, gallbladder wall thickening, or biliary dilatation. Pancreas: Unremarkable. No pancreatic ductal dilatation or surrounding inflammatory changes. Spleen: Normal in size without focal abnormality. Small scattered calcified granulomas. Adrenals/Urinary Tract: Adrenal glands unremarkable. 1.4 cm probable cyst, lower pole right kidney. 8.1 cm cyst, upper pole left kidney. No urolithiasis or hydronephrosis. Urinary bladder physiologically distended. Stomach/Bowel: Large hiatal hernia involving the gastric fundus, cardia, and portions of the body. The small bowel is decompressed, unremarkable. Normal appendix. The colon is nondilated, unremarkable. No evidence of active extravasation into the bowel. Lymphatic: No abdominal or pelvic adenopathy. Reproductive: Status post hysterectomy. No adnexal masses. Other: No ascites.  No free air. Musculoskeletal:  Multilevel spondylitic changes in the lower thoracic and lumbar spine. Advanced facet DJD L5-S1 likely accounts for the grade 1 anterolisthesis at this level. Negative for fracture or acute bone abnormality. IMPRESSION: 1. No active arterial extravasation or other acute findings. 2. Large hiatal hernia 3. Coronary and Aortic Atherosclerosis (ICD10-170.0). Electronically Signed   By: Lucrezia Europe M.D.   On: 03/12/2021 15:35      Impression / Plan:   MARKIYAH GAHM is a 75 y.o. y/o female with a history of a large hiatal hernia, presents to the ER with coffee ground emesis and melena.  Very likely she has had esophagitis from reflux leading to the same vs cameron ulcers from large hernia.  CT angiogram showed no active bleed  Plan   Keep head end of the bed elevated IV PPI NPO EGD tomorrow  Monitor CBC and transfuse as needed  I have discussed alternative options, risks & benefits,  which include, but are not limited to, bleeding, infection, perforation,respiratory complication & drug reaction.  The patient agrees with this plan & written consent will be obtained.     Thank you for involving me in the care of this patient.      LOS: 0 days   [name], MD  03/12/2021, 5:22 PM

## 2021-03-13 ENCOUNTER — Inpatient Hospital Stay: Payer: Self-pay

## 2021-03-13 ENCOUNTER — Encounter: Payer: Self-pay | Admitting: Internal Medicine

## 2021-03-13 ENCOUNTER — Inpatient Hospital Stay: Payer: Medicare HMO

## 2021-03-13 ENCOUNTER — Encounter: Payer: Self-pay | Admitting: Anesthesiology

## 2021-03-13 DIAGNOSIS — K922 Gastrointestinal hemorrhage, unspecified: Secondary | ICD-10-CM | POA: Diagnosis not present

## 2021-03-13 DIAGNOSIS — I4891 Unspecified atrial fibrillation: Secondary | ICD-10-CM | POA: Diagnosis not present

## 2021-03-13 LAB — CBC
HCT: 22.3 % — ABNORMAL LOW (ref 36.0–46.0)
Hemoglobin: 7.2 g/dL — ABNORMAL LOW (ref 12.0–15.0)
MCH: 33.2 pg (ref 26.0–34.0)
MCHC: 32.3 g/dL (ref 30.0–36.0)
MCV: 102.8 fL — ABNORMAL HIGH (ref 80.0–100.0)
Platelets: 195 10*3/uL (ref 150–400)
RBC: 2.17 MIL/uL — ABNORMAL LOW (ref 3.87–5.11)
RDW: 14.3 % (ref 11.5–15.5)
WBC: 11.2 10*3/uL — ABNORMAL HIGH (ref 4.0–10.5)
nRBC: 0 % (ref 0.0–0.2)

## 2021-03-13 LAB — PREPARE RBC (CROSSMATCH)

## 2021-03-13 LAB — MAGNESIUM: Magnesium: 1.8 mg/dL (ref 1.7–2.4)

## 2021-03-13 LAB — COMPREHENSIVE METABOLIC PANEL
ALT: 15 U/L (ref 0–44)
AST: 16 U/L (ref 15–41)
Albumin: 3 g/dL — ABNORMAL LOW (ref 3.5–5.0)
Alkaline Phosphatase: 36 U/L — ABNORMAL LOW (ref 38–126)
Anion gap: 6 (ref 5–15)
BUN: 62 mg/dL — ABNORMAL HIGH (ref 8–23)
CO2: 25 mmol/L (ref 22–32)
Calcium: 8.1 mg/dL — ABNORMAL LOW (ref 8.9–10.3)
Chloride: 108 mmol/L (ref 98–111)
Creatinine, Ser: 0.95 mg/dL (ref 0.44–1.00)
GFR, Estimated: >60 mL/min (ref >60)
Glucose, Bld: 113 mg/dL — ABNORMAL HIGH (ref 70–99)
Potassium: 3.6 mmol/L (ref 3.5–5.1)
Sodium: 139 mmol/L (ref 135–145)
Total Bilirubin: 0.7 mg/dL (ref 0.3–1.2)
Total Protein: 5.2 g/dL — ABNORMAL LOW (ref 6.5–8.1)

## 2021-03-13 LAB — IRON AND TIBC
Iron: 79 ug/dL (ref 28–170)
Saturation Ratios: 24 % (ref 10.4–31.8)
TIBC: 336 ug/dL (ref 250–450)
UIBC: 257 ug/dL

## 2021-03-13 LAB — HEMOGLOBIN AND HEMATOCRIT, BLOOD
HCT: 27.6 % — ABNORMAL LOW (ref 36.0–46.0)
Hemoglobin: 9 g/dL — ABNORMAL LOW (ref 12.0–15.0)

## 2021-03-13 LAB — PHOSPHORUS: Phosphorus: 3.3 mg/dL (ref 2.5–4.6)

## 2021-03-13 LAB — FERRITIN: Ferritin: 46 ng/mL (ref 11–307)

## 2021-03-13 LAB — VITAMIN B12: Vitamin B-12: 346 pg/mL (ref 180–914)

## 2021-03-13 LAB — FOLATE: Folate: 37 ng/mL (ref >5.9)

## 2021-03-13 MED ORDER — METOPROLOL TARTRATE 5 MG/5ML IV SOLN
5.0000 mg | INTRAVENOUS | Status: DC | PRN
  Administered 2021-03-16 – 2021-03-17 (×2): 5 mg via INTRAVENOUS
  Filled 2021-03-13 (×2): qty 5

## 2021-03-13 MED ORDER — METOPROLOL TARTRATE 5 MG/5ML IV SOLN
5.0000 mg | Freq: Once | INTRAVENOUS | Status: AC
  Administered 2021-03-13: 5 mg via INTRAVENOUS

## 2021-03-13 MED ORDER — DILTIAZEM HCL 25 MG/5ML IV SOLN
25.0000 mg | Freq: Once | INTRAVENOUS | Status: AC
  Administered 2021-03-13: 25 mg via INTRAVENOUS
  Filled 2021-03-13: qty 5

## 2021-03-13 MED ORDER — DICYCLOMINE HCL 10 MG PO CAPS
10.0000 mg | ORAL_CAPSULE | Freq: Four times a day (QID) | ORAL | Status: DC | PRN
  Administered 2021-03-19: 10 mg via ORAL
  Filled 2021-03-13 (×2): qty 1

## 2021-03-13 MED ORDER — METOPROLOL TARTRATE 5 MG/5ML IV SOLN
INTRAVENOUS | Status: AC
  Filled 2021-03-13: qty 5

## 2021-03-13 MED ORDER — SODIUM CHLORIDE 0.9% IV SOLUTION
Freq: Once | INTRAVENOUS | Status: AC
  Filled 2021-03-13: qty 250

## 2021-03-13 MED ORDER — DILTIAZEM HCL-DEXTROSE 125-5 MG/125ML-% IV SOLN (PREMIX)
5.0000 mg/h | INTRAVENOUS | Status: DC
  Administered 2021-03-13: 15 mg/h via INTRAVENOUS
  Administered 2021-03-13: 10 mg/h via INTRAVENOUS
  Administered 2021-03-13: 5 mg/h via INTRAVENOUS
  Administered 2021-03-13: 12.5 mg/h via INTRAVENOUS
  Administered 2021-03-13: 7.5 mg/h via INTRAVENOUS
  Administered 2021-03-14: 06:00:00 10 mg/h via INTRAVENOUS
  Administered 2021-03-14 – 2021-03-15 (×2): 5 mg/h via INTRAVENOUS
  Filled 2021-03-13 (×7): qty 125

## 2021-03-13 MED ORDER — SODIUM CHLORIDE 0.9% FLUSH: 10.0000 mL | INTRAVENOUS | Status: DC | PRN

## 2021-03-13 MED ORDER — CHLORHEXIDINE GLUCONATE CLOTH 2 % EX PADS
6.0000 | MEDICATED_PAD | Freq: Every day | CUTANEOUS | Status: DC
  Administered 2021-03-15 – 2021-03-20 (×5): 6 via TOPICAL
  Filled 2021-03-13 (×2): qty 6

## 2021-03-13 NOTE — Progress Notes (Signed)
Patient c/o right arm burning at IV site, infusions switched to left hand IV. Midline catheter removed. Patient also stating that IV in left hand is starting to bother her. IV team currently at bedside. Only current running infusion is RBCs in left hand as protonix gtt and diltiazem gtt had to be stopped d/t lack of access. IV team suggesting PICC line. MD made aware.

## 2021-03-13 NOTE — Progress Notes (Signed)
Patient had episode of small, black loose stool. Changed and repositioned.

## 2021-03-13 NOTE — Assessment & Plan Note (Addendum)
-   no prior known history; likely precipitated by GIB at this point - continue cardizem drip; use PRN lopressor as well - start oral lopressor; will adjust as necessary - cardizem drip off but HR does elevate very easily with minimal mobility but recovers at rest - CHA2DS2-VASc = 4 pts. Afib remaining persistent at this time -Echo obtained: EF 55 to 60%, mild LVH.  Indeterminate diastolic parameters. - discussed with cardiology, would wait 1-2 weeks for anticoagulation regardless given recent GIB workup - she has outpt followup with cardiology next week - continue 1 more night in hospital; increased lopressor to 100 mg BID. If rate still difficult to control tomorrow, may need inpt cardiology evaluation for consideration of amiodarone as well

## 2021-03-13 NOTE — Assessment & Plan Note (Addendum)
-   presented with coffee ground emesis and melena; on asa only; no other obvious meds. Imaging does show hiatal hernia - Hgb 7.2 g/dL and patient underwent 1 unit PRBC on 11/26, responded well -Underwent EGD on 03/15/2021.  Findings revealed large hiatal hernia with multiple clean-based Cameron ulcers noted.  Grade D esophagitis with clean-based ulcers. -Patient recommended to continue on Prilosec twice daily indefinitely per GI

## 2021-03-13 NOTE — Assessment & Plan Note (Signed)
-   Hold home BP meds now in setting of hypotension

## 2021-03-13 NOTE — Progress Notes (Signed)
Peripherally Inserted Central Catheter Placement  The IV Nurse has discussed with the patient and/or persons authorized to consent for the patient, the purpose of this procedure and the potential benefits and risks involved with this procedure.  The benefits include less needle sticks, lab draws from the catheter, and the patient may be discharged home with the catheter. Risks include, but not limited to, infection, bleeding, blood clot (thrombus formation), and puncture of an artery; nerve damage and irregular heartbeat and possibility to perform a PICC exchange if needed/ordered by physician.  Alternatives to this procedure were also discussed.  Bard Power PICC patient education guide, fact sheet on infection prevention and patient information card has been provided to patient /or left at bedside.    PICC Placement Documentation  PICC Triple Lumen 03/13/21 PICC Right Brachial 39 cm 0 cm (Active)  Indication for Insertion or Continuance of Line Vasoactive infusions;Limited venous access - need for IV therapy >5 days (PICC only) 03/13/21 1859  Exposed Catheter (cm) 0 cm 03/13/21 1859  Site Assessment Clean;Dry;Intact 03/13/21 1859  Lumen #1 Status Flushed;Saline locked;Blood return noted 03/13/21 1859  Lumen #2 Status Flushed;Saline locked;Blood return noted 03/13/21 1859  Lumen #3 Status Flushed;Saline locked;Blood return noted 03/13/21 1859  Dressing Type Transparent;Securing device 03/13/21 1859  Dressing Status Clean;Dry;Intact 03/13/21 1859  Antimicrobial disc in place? Yes 03/13/21 1859  Safety Lock Not Applicable 83/09/40 7680  Line Care Connections checked and tightened 03/13/21 1859  Line Adjustment (NICU/IV Team Only) No 03/13/21 1859  Dressing Intervention New dressing 03/13/21 1859  Dressing Change Due 03/20/21 03/13/21 1859       Rolena Infante 03/13/2021, 6:59 PM

## 2021-03-13 NOTE — Progress Notes (Signed)
Patients diet order changed to clear liquids, dining services called for tray.

## 2021-03-13 NOTE — Assessment & Plan Note (Signed)
-   Improved, possibly due to hypovolemia on admission

## 2021-03-13 NOTE — Assessment & Plan Note (Signed)
-   low suspicion for infection; likely due to GIB

## 2021-03-13 NOTE — Progress Notes (Signed)
[name] , MD [address] [place], [place], [place], [address] [id number], [place], [place], [place], [address] Phone: [phone]  Fax: [phone]   ARLANDA SHIPLETT is being followed for GI bleed day 1 of follow up   Subjective: No further melena, bowel movements, hematemesis.  Denies any abdominal pain denies any symptoms presently.   Objective: Vital signs in last 24 hours: Vitals:   03/13/21 0730 03/13/21 0800 03/13/21 1030 03/13/21 1058  BP: 91/76 96/74 (!) 111/98   Pulse: (!) 136 (!) 130 (!) 124   Resp: 19 17 16    Temp:  98.4 F (36.9 C) 98.1 F (36.7 C) 98.5 F (36.9 C)  TempSrc:  Oral Oral Oral  SpO2: 97% 92% 97%   Weight:      Height:       Weight change:   Intake/Output Summary (Last 24 hours) at 03/13/2021 1321 Last data filed at 03/13/2021 0600 Gross per 24 hour  Intake 1100 ml  Output 200 ml  Net 900 ml     Exam:  Abdomen: soft, nontender, normal bowel sounds   Lab Results: @LABTEST2 @ Micro Results: Recent Results (from the past 240 hour(s))  Resp Panel by RT-PCR (Flu A&B, Covid) Nasopharyngeal Swab     Status: None   Collection Time: 03/12/21  1:45 PM   Specimen: Nasopharyngeal Swab; Nasopharyngeal(NP) swabs in vial transport medium  Result Value Ref Range Status   SARS Coronavirus 2 by RT PCR NEGATIVE NEGATIVE Final    Comment: (NOTE) SARS-CoV-2 target nucleic acids are NOT DETECTED.  The SARS-CoV-2 RNA is generally detectable in upper respiratory specimens during the acute phase of infection. The lowest concentration of SARS-CoV-2 viral copies this assay can detect is 138 copies/mL. A negative result does not preclude SARS-Cov-2 infection and should not be used as the sole basis for treatment or other patient management decisions. A negative result may occur with  improper specimen collection/handling, submission of specimen other than nasopharyngeal swab, presence of viral mutation(s) within the areas targeted by this  assay, and inadequate number of viral copies(<138 copies/mL). A negative result must be combined with clinical observations, patient history, and epidemiological information. The expected result is Negative.  Fact Sheet for Patients:  EntrepreneurPulse.com.au  Fact Sheet for Healthcare Providers:  IncredibleEmployment.be  This test is no t yet approved or cleared by the [place] FDA and  has been authorized for detection and/or diagnosis of SARS-CoV-2 by FDA under an Emergency Use Authorization (EUA). This EUA will remain  in effect (meaning this test can be used) for the duration of the COVID-19 declaration under Section 564(b)(1) of the Act, 21 U.S.C.section 360bbb-3(b)(1), unless the authorization is terminated  or revoked sooner.       Influenza A by PCR NEGATIVE NEGATIVE Final   Influenza B by PCR NEGATIVE NEGATIVE Final    Comment: (NOTE) The Xpert Xpress SARS-CoV-2/FLU/RSV plus assay is intended as an aid in the diagnosis of influenza from Nasopharyngeal swab specimens and should not be used as a sole basis for treatment. Nasal washings and aspirates are unacceptable for Xpert Xpress SARS-CoV-2/FLU/RSV testing.  Fact Sheet for Patients: EntrepreneurPulse.com.au  Fact Sheet for Healthcare Providers: IncredibleEmployment.be  This test is not yet approved or cleared by the [place] FDA and has been authorized for detection and/or diagnosis of SARS-CoV-2 by FDA under an Emergency Use Authorization (EUA). This EUA will remain in effect (meaning this test can be used) for the duration of the COVID-19 declaration under Section  564(b)(1) of the Act, 21 U.S.C. section 360bbb-3(b)(1), unless the authorization is terminated or revoked.  Performed at [facility], [address], [place],  [address]    Studies/Results: DG Chest Portable 1 View  Result Date:  03/12/2021 CLINICAL DATA:  75 year old female with chest pain, hematemesis, and melena. EXAM: PORTABLE CHEST - 1 VIEW COMPARISON:  None. FINDINGS: The mediastinal contours are within normal limits. Mild cardiomegaly. Air-fluid level projects over the superior cardiac silhouette the lungs are clear bilaterally without evidence of focal consolidation, pleural effusion, or pneumothorax. No acute osseous abnormality. IMPRESSION: Suggestion of large hiatal hernia. Consider two-view chest radiographs versus CT chest, abdomen, and pelvis for further characterization as clinically indicated. Electronically Signed   By: Ruthann Cancer M.D.   On: 03/12/2021 13:47   [place] EKG SITE RITE  Result Date: 03/13/2021 If Site Rite image not attached, placement could not be confirmed due to current cardiac rhythm.  CT ANGIO GI BLEED  Result Date: 03/12/2021 CLINICAL DATA:  Hematemesis, melena, shortness of breath EXAM: CTA ABDOMEN AND PELVIS WITHOUT AND WITH CONTRAST TECHNIQUE: Multidetector CT imaging of the abdomen and pelvis was performed using the standard protocol during bolus administration of intravenous contrast. Multiplanar reconstructed images and MIPs were obtained and reviewed to evaluate the vascular anatomy. CONTRAST:  176mL OMNIPAQUE IOHEXOL 350 MG/ML SOLN COMPARISON:  None. FINDINGS: VASCULAR Aorta: Mild scattered calcified plaque. No aneurysm, dissection, or stenosis. Celiac: Patent without evidence of aneurysm, dissection, vasculitis or significant stenosis. SMA: Patent without evidence of aneurysm, dissection, vasculitis or significant stenosis. Replaced right hepatic arterial supply, an anatomic variant. Renals: Both renal arteries are patent without evidence of aneurysm, dissection, vasculitis, fibromuscular dysplasia or significant stenosis. IMA: Patent without evidence of aneurysm, dissection, vasculitis or significant stenosis. Inflow: Patent without evidence of aneurysm, dissection, vasculitis or  significant stenosis. Proximal Outflow: Bilateral common femoral and visualized portions of the superficial and profunda femoral arteries are patent without evidence of aneurysm, dissection, vasculitis or significant stenosis. Veins: No obvious venous abnormality within the limitations of this arterial phase study. Review of the MIP images confirms the above findings. NON-VASCULAR Lower chest: Coronary calcifications.  Large hiatal hernia. Hepatobiliary: No focal liver abnormality is seen. No gallstones, gallbladder wall thickening, or biliary dilatation. Pancreas: Unremarkable. No pancreatic ductal dilatation or surrounding inflammatory changes. Spleen: Normal in size without focal abnormality. Small scattered calcified granulomas. Adrenals/Urinary Tract: Adrenal glands unremarkable. 1.4 cm probable cyst, lower pole right kidney. 8.1 cm cyst, upper pole left kidney. No urolithiasis or hydronephrosis. Urinary bladder physiologically distended. Stomach/Bowel: Large hiatal hernia involving the gastric fundus, cardia, and portions of the body. The small bowel is decompressed, unremarkable. Normal appendix. The colon is nondilated, unremarkable. No evidence of active extravasation into the bowel. Lymphatic: No abdominal or pelvic adenopathy. Reproductive: Status post hysterectomy. No adnexal masses. Other: No ascites.  No free air. Musculoskeletal: Multilevel spondylitic changes in the lower thoracic and lumbar spine. Advanced facet DJD L5-S1 likely accounts for the grade 1 anterolisthesis at this level. Negative for fracture or acute bone abnormality. IMPRESSION: 1. No active arterial extravasation or other acute findings. 2. Large hiatal hernia 3. Coronary and Aortic Atherosclerosis (ICD10-170.0). Electronically Signed   By: Lucrezia Europe M.D.   On: 03/12/2021 15:35   Medications: I have reviewed the patient's current medications. Scheduled Meds:  feeding supplement (GLUCERNA SHAKE)  237 mL Oral TID BM   metoprolol  tartrate       sodium chloride flush  10-40 mL Intracatheter Q12H  Continuous Infusions:  diltiazem (CARDIZEM) infusion 15 mg/hr (03/13/21 1225)   pantoprazole Stopped (03/13/21 1043)   PRN Meds:.dicyclomine, metoprolol tartrate, sodium chloride flush   Assessment: Principal Problem:   GI bleed Active Problems:   Hypertension   Macrocytic anemia   Hypothyroidism   Prediabetes   Hiatal hernia   SIRS (systemic inflammatory response syndrome) (HCC)   Iron deficiency anemia   Atrial fibrillation with RVR ([place])  MENA SIMONIS is a 75 y.o. y/o female with a history of a large hiatal hernia, presents to the ER with coffee ground emesis and melena. Very likely she has had esophagitis from reflux leading to the same vs cameron ulcers from large hernia.  CT angiogram showed no active   bleed.Patient was planned for an endoscopy this morning.  Unfortunately she has been tachycardic.  Hemoglobin dropped overnight from 9-7.2.  No evidence of iron deficiency.  Patient is being treated for an A. fib with RVR with Cardizem, does not have adequate IV access and we will plan for a PICC line.  Is undergoing a blood transfusion and will have a hemoglobin checked after.  Presently no concern for active bleeding as she has not had a bowel movement, no melena, no hematemesis   Plan    Keep head end of the bed elevated IV PPI NPO EGD tomorrow once adequate IV access has been obtained, tachycardia has resolved. Monitor CBC and transfuse as needed If there is concern for an active bleed in the interim would require a tagged RBC scan or CT angiogram.    LOS: 1 day   [name], MD 03/13/2021, 1:21 PM

## 2021-03-13 NOTE — Assessment & Plan Note (Addendum)
-   iron studies okay at this time; s/p venofer on 11/28

## 2021-03-13 NOTE — Assessment & Plan Note (Addendum)
-   large HH noted; continue prilosec

## 2021-03-13 NOTE — Assessment & Plan Note (Addendum)
-   iron studies adequate - B12 346 - folate 37 - venofer ordered per GI on 11/28

## 2021-03-13 NOTE — Assessment & Plan Note (Addendum)
-   Resume Synthroid once work-up complete - TSH 6.2 (elevated, possibly from acute illness) - check FT4 (lower normal, 0.64) and TT3 (low, 67) - will increase synthroid to 75 mcg daily; repeat TSH in 4-6 weeks

## 2021-03-13 NOTE — Progress Notes (Signed)
Pt transferred from ED 14 to room ED35. Heart  rate begin climbing  up to the 140's and 150's sustaining and then at times to the low 160's. Hospitalist notified and orders given for IV Metoprolol 5mg  administered.  Was not effective, therefore IV push/bolus of Cardizem 25mg  given and Cardizem drip started and was titrated up due to HR staying elevated. Pt tolerated it well without an issues. Pt c/o gas once during the shift. Offered to notify the doctor but Pt declined but as the shift went on, Pt stated the gas further resolved on their own. No further complaints during the shift. Report given to oncoming Nurse.

## 2021-03-13 NOTE — Progress Notes (Signed)
Progress Note    MELEANA COMMERFORD   BZJ:[id number]  DOB: Mar 23, 1946  DOA: 03/12/2021     1 PCP: Einar Pheasant, MD  Initial CC: dark stools, coffee ground emesis  Hospital Course: [name] is a 75 yo female with PMH GERD, HLD, hypothyroidism, anemia who presented to the ER with dark stools and dark-colored vomiting that started the morning of admission. On work-up in the ER she was found to be hypotensive, tachycardic, hypothermic. Initial hemoglobin noted to be 9 g/dL.  WBC 13.1, platelet count 218. BUN 72, creatinine 0.93. She also developed A. fib with RVR and was started on a Cardizem drip.  1 unit of blood was ordered but due to tachycardia, this was canceled.  Hemoglobin further down trended the morning following admission to 7.2 g/dL and she was again ordered 1 unit PRBC for transfusion. GI was consulted on admission for further evaluation and plans for EGD.  Interval History:  Seen in ER this am. Daughter present bedside.  We discussed symptoms when blood was started last night.  Seems that her heart rate was already elevated and became more elevated after blood started therefore it was stopped but she denied any other obvious symptoms consistent with transfusion reaction.  Therefore, discussed reattempt of blood this morning given further drop in hemoglobin; patient and daughter were okay with this.  Assessment & Plan: * GI bleed - presented with coffee ground emesis and melena; on asa only; no other obvious meds. Imaging does show hiatal hernia - Hgb further down to 7.2 g/dL this am; did not receive any blood (stopped due to tachycardia, but she was already tachy prior to blood, and I believe her volume depletion worsened this, not a transfusion reaction) - discussed with patient and daughter; we will re-attempt 1 unit PRBC this morning and monitor hemodynamics - trend H/H - if IV access remains an issue, will be necessary to place PICC - follow up GI consult and timing  of EGD - continue protonix drip -If BP downtrends further, will resume IVF  Atrial fibrillation with RVR ([place]) - no prior known history; likely precipitated by GIB at this point - not an anticoag candidate currently until further workup/etiology pursued - continue cardizem drip; use PRN lopressor as well - no CP at this time   Iron deficiency anemia - Follow-up iron studies  Macrocytic anemia - Follow-up iron studies, folate, B12  Hypothermia-resolved as of 03/13/2021 - Improved, possibly due to hypovolemia on admission  SIRS (systemic inflammatory response syndrome) ([place]) - low suspicion for infection; likely due to GIB  Hiatal hernia - follow up findings during EGD  Hypothyroidism - Resume Synthroid once work-up complete  Hypertension - Hold home BP meds now in setting of hypotension  Prediabetes - last A1c 5.9% on 01/12/21 - continue diet control     Old records reviewed in assessment of this patient  Antimicrobials:   DVT prophylaxis: SCD  Code Status:   Code Status: Full Code  Disposition Plan:   Status is: Inpatient   Objective: Blood pressure (!) 111/98, pulse (!) 124, temperature 98.5 F (36.9 C), temperature source Oral, resp. rate 16, height 5\' 3"  (1.6 m), weight 95.3 kg, SpO2 97 %.  Examination:  Physical Exam Constitutional:      General: She is not in acute distress.    Appearance: Normal appearance.  HENT:     Head: Normocephalic and atraumatic.     Mouth/Throat:     Mouth: Mucous membranes are moist.  Eyes:  Extraocular Movements: Extraocular movements intact.  Cardiovascular:     Rate and Rhythm: Tachycardia present. Rhythm irregular.     Heart sounds: No murmur heard. Pulmonary:     Effort: Pulmonary effort is normal. No respiratory distress.     Breath sounds: Normal breath sounds.  Abdominal:     General: Bowel sounds are normal. There is no distension.     Palpations: Abdomen is soft.     Tenderness: There is no abdominal  tenderness.  Musculoskeletal:        General: Normal range of motion.     Cervical back: Normal range of motion and neck supple.  Skin:    General: Skin is warm and dry.  Neurological:     General: No focal deficit present.     Mental Status: She is alert.  Psychiatric:        Mood and Affect: Mood normal.        Behavior: Behavior normal.     Consultants:  GI  Procedures:    Data Reviewed: I have personally reviewed labs and imaging studies     LOS: 1 day  Time spent: Greater than 50% of the 35 minute visit was spent in counseling/coordination of care for the patient as laid out in the A&P.   Dwyane Dee, MD [facility] 03/13/2021, 12:45 PM

## 2021-03-13 NOTE — Progress Notes (Signed)
PICC line placement confirmed in correct position and is ready for use.

## 2021-03-13 NOTE — Anesthesia Preprocedure Evaluation (Deleted)
Anesthesia Evaluation  Patient identified by MRN, date of birth, ID band Patient awake, Patient confused and Patient unresponsive    History of Anesthesia Complications (+) PONV and history of anesthetic complications  Airway    Neck ROM: Limited    Dental   Pulmonary neg pulmonary ROS,           Cardiovascular hypertension, Pt. on medications + dysrhythmias (Afib with RVR yesterday-started cardizem gtt) Atrial Fibrillation   Hyperlipidemia  12/05/14 ECHO: NORMAL LEFT VENTRICULAR SYSTOLIC FUNCTION WITH MILD LVH  MILD VALVULAR REGURGITATION (See above)  NO VALVULAR STENOSIS  MILD PHTN  Closest EF: >55% (Estimated)  LVH: MILD LVH  Contraction: Normal  Size: MILDLY ENLARGED  Mitral: MILD MR  Tricuspid: MILD TR     Neuro/Psych  Headaches,    GI/Hepatic Neg liver ROS, hiatal hernia, GERD  ,GI bleed - receiving blood   Endo/Other  Hypothyroidism   Renal/GU negative Renal ROS  negative genitourinary   Musculoskeletal  (+) Arthritis , Cervical DDD   Abdominal   Peds  Hematology  (+) Blood dyscrasia (received pRBCs yesterday), anemia ,   Anesthesia Other Findings Skin cancer Amanda Browning is a 75 y.o. female with a past medical history of anemia, gastric reflux, hypertension, hyperlipidemia, obesity, presents to the emergency department with dark stool and dark vomit.  According to the patient around 3:00 this morning she developed nausea vomiting with dark vomit.  States she is also had several dark bowel movements today as well.  States yesterday she felt well.  Patient denies any blood thinner use, denies any history of GI bleeds.  States it has been approximately 5 years since her last colonoscopy.  Patient denies any abdominal pain denies any fever.  Patient found to be quite hypotensive 84/56 on arrival.  Nauseated spitting into an emesis bag occasionally with dark sputum mixed in with saliva.   Past Medical  History: Diagnosis Date . Anemia  . Chicken pox  . Colon polyps  . Complication of anesthesia   doesn't take much anesthesia to put pt to sleep, slow to wake up . DDD (degenerative disc disease), cervical 02/05/13  C5-C6 and C6-C7 . GERD (gastroesophageal reflux disease)  . History of chicken pox  . Hypercholesterolemia  . Hyperlipidemia  . Hypertension  . Hypothyroidism  . Migraines  . PONV (postoperative nausea and vomiting)  . Squamous cell skin cancer     Reproductive/Obstetrics negative OB ROS                            Anesthesia Physical Anesthesia Plan  ASA: 3  Anesthesia Plan: MAC   Post-op Pain Management:    Induction:   PONV Risk Score and Plan:   Airway Management Planned: Mask  Additional Equipment:   Intra-op Plan:   Post-operative Plan:   Informed Consent: I have reviewed the patients History and Physical, chart, labs and discussed the procedure including the risks, benefits and alternatives for the proposed anesthesia with the patient or authorized representative who has indicated his/her understanding and acceptance.       Plan Discussed with: CRNA  Anesthesia Plan Comments: (c5-c6 DDD  Case cx--patient not on cardizem gtt because her line clotted off, not because it needed to be stopped. Also, she has new onset afib-which no one worked up)      Solicitor

## 2021-03-13 NOTE — Progress Notes (Signed)
Lab notified of pending H&H draw needing collected.

## 2021-03-13 NOTE — Assessment & Plan Note (Signed)
-   last A1c 5.9% on 01/12/21 - continue diet control

## 2021-03-13 NOTE — Hospital Course (Addendum)
Amanda Browning is a 75 yo female with PMH GERD, HLD, hypothyroidism, anemia who presented to the ER with dark stools and dark-colored vomiting that started the morning of admission. On work-up in the ER she was found to be hypotensive, tachycardic, hypothermic. Initial hemoglobin noted to be 9 g/dL.  WBC 13.1, platelet count 218. BUN 72, creatinine 0.93. She also developed A. fib with RVR and was started on a Cardizem drip.  1 unit of blood was ordered but due to tachycardia, this was canceled.  Hemoglobin further down trended the morning following admission to 7.2 g/dL and she was again ordered 1 unit PRBC for transfusion. GI was consulted on admission for further evaluation; she underwent EGD on 03/15/21.

## 2021-03-14 ENCOUNTER — Encounter: Payer: Self-pay | Admitting: Internal Medicine

## 2021-03-14 ENCOUNTER — Inpatient Hospital Stay: Payer: Medicare HMO

## 2021-03-14 ENCOUNTER — Other Ambulatory Visit: Payer: Self-pay

## 2021-03-14 ENCOUNTER — Encounter
Admission: EM | Disposition: A | Discharge status: Home / Self Care | Payer: Self-pay | Source: Home / Self Care | Attending: Internal Medicine

## 2021-03-14 DIAGNOSIS — I4891 Unspecified atrial fibrillation: Secondary | ICD-10-CM | POA: Diagnosis not present

## 2021-03-14 DIAGNOSIS — K922 Gastrointestinal hemorrhage, unspecified: Secondary | ICD-10-CM | POA: Diagnosis not present

## 2021-03-14 LAB — TYPE AND SCREEN
ABO/RH(D): O POS
Antibody Screen: NEGATIVE
Unit division: 0
Unit division: 0

## 2021-03-14 LAB — CBC
HCT: 25.9 % — ABNORMAL LOW (ref 36.0–46.0)
Hemoglobin: 8.7 g/dL — ABNORMAL LOW (ref 12.0–15.0)
MCH: 33.5 pg (ref 26.0–34.0)
MCHC: 33.6 g/dL (ref 30.0–36.0)
MCV: 99.6 fL (ref 80.0–100.0)
Platelets: 149 10*3/uL — ABNORMAL LOW (ref 150–400)
RBC: 2.6 MIL/uL — ABNORMAL LOW (ref 3.87–5.11)
RDW: 16.1 % — ABNORMAL HIGH (ref 11.5–15.5)
WBC: 9 10*3/uL (ref 4.0–10.5)
nRBC: 0 % (ref 0.0–0.2)

## 2021-03-14 LAB — BPAM RBC
Blood Product Expiration Date: 202212212359
Blood Product Expiration Date: 202212282359
ISSUE DATE / TIME: 202211251657
ISSUE DATE / TIME: 202211261039
Unit Type and Rh: 5100
Unit Type and Rh: 5100

## 2021-03-14 LAB — MAGNESIUM: Magnesium: 1.8 mg/dL (ref 1.7–2.4)

## 2021-03-14 LAB — BASIC METABOLIC PANEL
Anion gap: 3 — ABNORMAL LOW (ref 5–15)
BUN: 25 mg/dL — ABNORMAL HIGH (ref 8–23)
CO2: 28 mmol/L (ref 22–32)
Calcium: 8.3 mg/dL — ABNORMAL LOW (ref 8.9–10.3)
Chloride: 106 mmol/L (ref 98–111)
Creatinine, Ser: 0.78 mg/dL (ref 0.44–1.00)
GFR, Estimated: >60 mL/min (ref >60)
Glucose, Bld: 109 mg/dL — ABNORMAL HIGH (ref 70–99)
Potassium: 3.3 mmol/L — ABNORMAL LOW (ref 3.5–5.1)
Sodium: 137 mmol/L (ref 135–145)

## 2021-03-14 LAB — TROPONIN I (HIGH SENSITIVITY): Troponin I (High Sensitivity): 12 ng/L (ref <18)

## 2021-03-14 LAB — TSH: TSH: 6.28 u[IU]/mL — ABNORMAL HIGH (ref 0.350–4.500)

## 2021-03-14 LAB — T4, FREE: Free T4: 0.64 ng/dL (ref 0.61–1.12)

## 2021-03-14 SURGERY — ESOPHAGOGASTRODUODENOSCOPY (EGD) WITH PROPOFOL
Anesthesia: General

## 2021-03-14 MED ORDER — METOPROLOL TARTRATE 25 MG PO TABS
25.0000 mg | ORAL_TABLET | Freq: Two times a day (BID) | ORAL | Status: DC
  Administered 2021-03-14 – 2021-03-15 (×3): 25 mg via ORAL
  Filled 2021-03-14 (×2): qty 1

## 2021-03-14 MED ORDER — ALTEPLASE 2 MG IJ SOLR
2.0000 mg | Freq: Once | INTRAMUSCULAR | Status: AC
  Administered 2021-03-14: 10:00:00 2 mg
  Filled 2021-03-14: qty 2

## 2021-03-14 MED ORDER — PANTOPRAZOLE SODIUM 40 MG IV SOLR
40.0000 mg | Freq: Two times a day (BID) | INTRAVENOUS | Status: DC
Start: 2021-03-14 — End: 2021-03-15
  Administered 2021-03-14 – 2021-03-15 (×3): 40 mg via INTRAVENOUS
  Filled 2021-03-14 (×3): qty 40

## 2021-03-14 MED ORDER — METOPROLOL TARTRATE 25 MG PO TABS
ORAL_TABLET | ORAL | Status: AC
  Filled 2021-03-14: qty 1

## 2021-03-14 NOTE — ED Notes (Signed)
Patient resting quietly in bed, appears sleeping. Resp even, unlabored on 2L per [place]. Afib on monitor at 70-90's. Diltiazem gtt infusing at 15 mg/hr, protonix gtt infusing at 8 mg/hr. No distress noted at this time.

## 2021-03-14 NOTE — ED Notes (Signed)
Diltiazem gtt titrated down to 10 mg/hr at this time.

## 2021-03-14 NOTE — ED Notes (Signed)
Per IV team RN, TPA must sit in PICC for minimum of 2 hours

## 2021-03-14 NOTE — Progress Notes (Signed)
Amanda Browning , MD [address], [place], [place], [place], [id number] [id number] [place], [place], [place], [address] Phone: [phone]  Fax: [phone]   Amanda Browning is being followed for GI bleed day 2 of follow up   Subjective: 1 black bowel movement last night but none since yesterday   Objective: Vital signs in last 24 hours: Vitals:   03/14/21 0930 03/14/21 1000 03/14/21 1030 03/14/21 1139  BP: 104/76 109/74 114/63   Pulse: 91 83 89   Resp: 18 19 17    Temp:      TempSrc:      SpO2: 97% 97% 98%   Weight:    95.3 kg  Height:    5\' 3"  (1.6 m)   Weight change:   Intake/Output Summary (Last 24 hours) at 03/14/2021 1209 Last data filed at 03/13/2021 2232 Gross per 24 hour  Intake 441.83 ml  Output 1600 ml  Net -1158.17 ml     Exam:  Abdomen: soft, nontender, normal bowel sounds   Lab Results: @LABTEST2 @ Micro Results: Recent Results (from the past 240 hour(s))  Resp Panel by RT-PCR (Flu A&B, Covid) Nasopharyngeal Swab     Status: None   Collection Time: 03/12/21  1:45 PM   Specimen: Nasopharyngeal Swab; Nasopharyngeal(NP) swabs in vial transport medium  Result Value Ref Range Status   SARS Coronavirus 2 by RT PCR NEGATIVE NEGATIVE Final    Comment: (NOTE) SARS-CoV-2 target nucleic acids are NOT DETECTED.  The SARS-CoV-2 RNA is generally detectable in upper respiratory specimens during the acute phase of infection. The lowest concentration of SARS-CoV-2 viral copies this assay can detect is 138 copies/mL. A negative result does not preclude SARS-Cov-2 infection and should not be used as the sole basis for treatment or other patient management decisions. A negative result may occur with  improper specimen collection/handling, submission of specimen other than nasopharyngeal swab, presence of viral mutation(s) within the areas targeted by this assay, and inadequate number of viral copies(<138 copies/mL). A negative result must be combined  with clinical observations, patient history, and epidemiological information. The expected result is Negative.  Fact Sheet for Patients:  EntrepreneurPulse.com.au  Fact Sheet for Healthcare Providers:  IncredibleEmployment.be  This test is no t yet approved or cleared by the [place] FDA and  has been authorized for detection and/or diagnosis of SARS-CoV-2 by FDA under an Emergency Use Authorization (EUA). This EUA will remain  in effect (meaning this test can be used) for the duration of the COVID-19 declaration under Section 564(b)(1) of the Act, 21 U.S.C.section 360bbb-3(b)(1), unless the authorization is terminated  or revoked sooner.       Influenza A by PCR NEGATIVE NEGATIVE Final   Influenza B by PCR NEGATIVE NEGATIVE Final    Comment: (NOTE) The Xpert Xpress SARS-CoV-2/FLU/RSV plus assay is intended as an aid in the diagnosis of influenza from Nasopharyngeal swab specimens and should not be used as a sole basis for treatment. Nasal washings and aspirates are unacceptable for Xpert Xpress SARS-CoV-2/FLU/RSV testing.  Fact Sheet for Patients: EntrepreneurPulse.com.au  Fact Sheet for Healthcare Providers: IncredibleEmployment.be  This test is not yet approved or cleared by the [place] FDA and has been authorized for detection and/or diagnosis of SARS-CoV-2 by FDA under an Emergency Use Authorization (EUA). This EUA will remain in effect (meaning this test can be used) for the duration of the COVID-19 declaration under Section 564(b)(1) of the Act, 21 U.S.C. section 360bbb-3(b)(1), unless the authorization is terminated or  revoked.  Performed at [facility], [address] [place], [place] [address]    Studies/Results: [place] CHEST PORT 1 VIEW  Result Date: 03/13/2021 CLINICAL DATA:  PICC line placement EXAM: PORTABLE CHEST 1 VIEW COMPARISON:  Chest x-ray 03/12/2021.  FINDINGS: Right PICC line with tip overlying the expected region of distal superior vena cava. Prominent cardiac silhouette which may be due to portable AP technique. Otherwise the heart and mediastinal contours are within normal limits. Large hiatal hernia noted. No focal consolidation. No pulmonary edema. No pleural effusion. No pneumothorax. No acute osseous abnormality. IMPRESSION: 1. Right PICC line with tip overlying the expected region of distal superior vena cava. 2. Large hiatal hernia. Electronically Signed   By: Iven Finn M.D.   On: 03/13/2021 19:20   DG Chest Portable 1 View  Result Date: 03/12/2021 CLINICAL DATA:  74 year old female with chest pain, hematemesis, and melena. EXAM: PORTABLE CHEST - 1 VIEW COMPARISON:  None. FINDINGS: The mediastinal contours are within normal limits. Mild cardiomegaly. Air-fluid level projects over the superior cardiac silhouette the lungs are clear bilaterally without evidence of focal consolidation, pleural effusion, or pneumothorax. No acute osseous abnormality. IMPRESSION: Suggestion of large hiatal hernia. Consider two-view chest radiographs versus CT chest, abdomen, and pelvis for further characterization as clinically indicated. Electronically Signed   By: Ruthann Cancer M.D.   On: 03/12/2021 13:47   [place] EKG SITE RITE  Result Date: 03/13/2021 If Site Rite image not attached, placement could not be confirmed due to current cardiac rhythm.  CT ANGIO GI BLEED  Result Date: 03/12/2021 CLINICAL DATA:  Hematemesis, melena, shortness of breath EXAM: CTA ABDOMEN AND PELVIS WITHOUT AND WITH CONTRAST TECHNIQUE: Multidetector CT imaging of the abdomen and pelvis was performed using the standard protocol during bolus administration of intravenous contrast. Multiplanar reconstructed images and MIPs were obtained and reviewed to evaluate the vascular anatomy. CONTRAST:  123mL OMNIPAQUE IOHEXOL 350 MG/ML SOLN COMPARISON:  None. FINDINGS: VASCULAR Aorta: Mild  scattered calcified plaque. No aneurysm, dissection, or stenosis. Celiac: Patent without evidence of aneurysm, dissection, vasculitis or significant stenosis. SMA: Patent without evidence of aneurysm, dissection, vasculitis or significant stenosis. Replaced right hepatic arterial supply, an anatomic variant. Renals: Both renal arteries are patent without evidence of aneurysm, dissection, vasculitis, fibromuscular dysplasia or significant stenosis. IMA: Patent without evidence of aneurysm, dissection, vasculitis or significant stenosis. Inflow: Patent without evidence of aneurysm, dissection, vasculitis or significant stenosis. Proximal Outflow: Bilateral common femoral and visualized portions of the superficial and profunda femoral arteries are patent without evidence of aneurysm, dissection, vasculitis or significant stenosis. Veins: No obvious venous abnormality within the limitations of this arterial phase study. Review of the MIP images confirms the above findings. NON-VASCULAR Lower chest: Coronary calcifications.  Large hiatal hernia. Hepatobiliary: No focal liver abnormality is seen. No gallstones, gallbladder wall thickening, or biliary dilatation. Pancreas: Unremarkable. No pancreatic ductal dilatation or surrounding inflammatory changes. Spleen: Normal in size without focal abnormality. Small scattered calcified granulomas. Adrenals/Urinary Tract: Adrenal glands unremarkable. 1.4 cm probable cyst, lower pole right kidney. 8.1 cm cyst, upper pole left kidney. No urolithiasis or hydronephrosis. Urinary bladder physiologically distended. Stomach/Bowel: Large hiatal hernia involving the gastric fundus, cardia, and portions of the body. The small bowel is decompressed, unremarkable. Normal appendix. The colon is nondilated, unremarkable. No evidence of active extravasation into the bowel. Lymphatic: No abdominal or pelvic adenopathy. Reproductive: Status post hysterectomy. No adnexal masses. Other: No ascites.   No free air. Musculoskeletal: Multilevel spondylitic changes in the lower  thoracic and lumbar spine. Advanced facet DJD L5-S1 likely accounts for the grade 1 anterolisthesis at this level. Negative for fracture or acute bone abnormality. IMPRESSION: 1. No active arterial extravasation or other acute findings. 2. Large hiatal hernia 3. Coronary and Aortic Atherosclerosis (ICD10-170.0). Electronically Signed   By: Lucrezia Europe M.D.   On: 03/12/2021 15:35   Medications: I have reviewed the patient's current medications. Scheduled Meds:  [MAR Hold] Chlorhexidine Gluconate Cloth  6 each Topical Daily   [MAR Hold] feeding supplement (GLUCERNA SHAKE)  237 mL Oral TID BM   metoprolol tartrate  25 mg Oral BID   [MAR Hold] sodium chloride flush  10-40 mL Intracatheter Q12H   Continuous Infusions:  [MAR Hold] diltiazem (CARDIZEM) infusion Stopped (03/14/21 8110)   pantoprazole Stopped (03/14/21 1141)   PRN Meds:.[MAR Hold] dicyclomine, [MAR Hold] metoprolol tartrate, [MAR Hold] sodium chloride flush, [MAR Hold] sodium chloride flush   Assessment: Principal Problem:   GI bleed Active Problems:   Hypertension   Macrocytic anemia   Hypothyroidism   Prediabetes   Hiatal hernia   SIRS (systemic inflammatory response syndrome) (HCC)   Iron deficiency anemia   Atrial fibrillation with RVR ([place])   JODI CRISCUOLO is a 75 y.o. y/o female with a history of a large hiatal hernia, presents to the ER with coffee ground emesis and melena. Very likely she has had esophagitis from reflux leading to the same vs cameron ulcers from large hernia.  CT angiogram showed no active   bleed.Patient was planned for an endoscopy this morning.  Unfortunately she has been tachycardic.  Hemoglobin dropped overnight from 9-7.2-8.7.  No evidence of iron deficiency.  Patient is being treated for an A. fib with RVR with Cardizem, does not have adequate IV access and a PICC line was placed.  No further melena overnight.  This  morning it appears that the PICC line was blocked hence the Cardizem was stopped.  She was brought to endoscopy off Cardizem and was found to have a heart rate in the 120s hence was found to be unsafe to perform endoscopy with anesthesia.  Procedure had to be canceled.  No evidence of active bleeding.     Plan    Keep head end of the bed elevated IV PPI NPO EGD tomorrow once adequate IV access has been obtained, tachycardia has resolved.  Please have this checked again before patient is being sent to endoscopy.  I have discussed with the patient and the family reasons why why endoscopy was canceled today. Monitor CBC and transfuse as needed If there is concern for an active bleed in the interim would require a tagged RBC scan or CT angiogram.    LOS: 2 days   Amanda Bellows, MD 03/14/2021, 12:09 PM

## 2021-03-14 NOTE — Progress Notes (Signed)
A consult was placed to the IV Therapist as "2 of the 3 ports on the picc line are occluded."  Picc was placed last evening; tip in CAJ;  picc was ready for use last evening, but daughter stated "the picc wasn't used all night."  Protonix is now infusing to the gray port, but Red and White ports were found to be clotted off;  TPA instilled to each of those ports;  dead-end caps intact also;  pt is scheduled for a procedure today and will need IV access;  RN aware of picc line situation;  New tubing was hung to the gray port of the picc line for Protonix infusion, and new tubing is now ready for the Cardizem drip if it is restarted.  Will follow up with pt and remove TPA in approx 2 hours.

## 2021-03-14 NOTE — ED Notes (Signed)
IV team at bedside 

## 2021-03-14 NOTE — Progress Notes (Signed)
Met patient in the Endoscopy dept to follow up on Picc line;  TPA removed from red and white ports; both have blood returns; red port flushes easily, but white port flushes harder; RN in Endo dept reported that "the gray port is not working now."  Was able to get a blood return from the gray port and flush it, but again, it flushes harder than the red port;  If pt holds her arm straight out to her right side, and takes a deep breath, then coughs, all 3 ports flush easier and blood returns are noted;  spoke with Dr Sabino Gasser regarding picc situation; suggest a follow up CXR for tip placement;  pt will be transferred to room 236 when the bed is available; procedure in Endo to be cancelled for today. Will continue to follow.

## 2021-03-14 NOTE — Progress Notes (Signed)
Progress Note    Amanda Browning   JSH:[id number]  DOB: [birth year]  DOA: 03/12/2021     2 PCP: Einar Pheasant, MD  Initial CC: dark stools, coffee ground emesis  Hospital Course: Ms. Cizek is a 75 yo female with PMH GERD, HLD, hypothyroidism, anemia who presented to the ER with dark stools and dark-colored vomiting that started the morning of admission. On work-up in the ER she was found to be hypotensive, tachycardic, hypothermic. Initial hemoglobin noted to be 9 g/dL.  WBC 13.1, platelet count 218. BUN 72, creatinine 0.93. She also developed A. fib with RVR and was started on a Cardizem drip.  1 unit of blood was ordered but due to tachycardia, this was canceled.  Hemoglobin further down trended the morning following admission to 7.2 g/dL and she was again ordered 1 unit PRBC for transfusion. GI was consulted on admission for further evaluation and plans for EGD.  Interval History:  PICC placed yesterday. Rate on cardizem was controlled this am however due to port clogging, drip was held and HR again elevated, therefore EGD postponed today.  VAT team paged for TPA to ports.   Assessment & Plan: * GI bleed - presented with coffee ground emesis and melena; on asa only; no other obvious meds. Imaging does show hiatal hernia - Hgb 7.2 g/dL and patient underwent 1 unit PRBC on 11/26, responded well - continue trending H/H - change protonix drip to IV BID - timing of EGD still pending until RVR better controlled (issues with IV access and then PICC malfunction)  Atrial fibrillation with RVR (HCC) - no prior known history; likely precipitated by GIB at this point - not an anticoag candidate currently until further workup/etiology pursued - continue cardizem drip; use PRN lopressor as well - start oral lopressor - notify primary team if any interruption to treatment; EGD delayed due to PICC clogging without primary team notification - no CP at this time  - obtain echo; check  trop (negative, 12) - low cardiac risk for EGD; 0 points per RCRI  Iron deficiency anemia - iron studies okay at this time   Macrocytic anemia - iron studies adequate - B12 346 - folate 37  Hypothermia-resolved as of 03/13/2021 - Improved, possibly due to hypovolemia on admission  SIRS (systemic inflammatory response syndrome) ([place]) - low suspicion for infection; likely due to GIB  Hiatal hernia - follow up findings during EGD  Hypothyroidism - Resume Synthroid once work-up complete - TSH 6.2 (elevated, possibly from acute illness) - check FT4 and TT3  Hypertension - Hold home BP meds now in setting of hypotension  Prediabetes - last A1c 5.9% on 01/12/21 - continue diet control     Old records reviewed in assessment of this patient  Antimicrobials:   DVT prophylaxis: SCD  Code Status:   Code Status: Full Code  Disposition Plan:   Status is: Inpatient   Objective: Blood pressure 123/71, pulse 93, temperature 98.4 F (36.9 C), resp. rate 18, height 5\' 3"  (1.6 m), weight 95.3 kg, SpO2 97 %.  Examination:  Physical Exam Constitutional:      General: She is not in acute distress.    Appearance: Normal appearance.  HENT:     Head: Normocephalic and atraumatic.     Mouth/Throat:     Mouth: Mucous membranes are moist.  Eyes:     Extraocular Movements: Extraocular movements intact.  Cardiovascular:     Rate and Rhythm: Tachycardia present. Rhythm irregular.  Heart sounds: No murmur heard. Pulmonary:     Effort: Pulmonary effort is normal. No respiratory distress.     Breath sounds: Normal breath sounds.  Abdominal:     General: Bowel sounds are normal. There is no distension.     Palpations: Abdomen is soft.     Tenderness: There is no abdominal tenderness.  Musculoskeletal:        General: Normal range of motion.     Cervical back: Normal range of motion and neck supple.  Skin:    General: Skin is warm and dry.  Neurological:     General: No  focal deficit present.     Mental Status: She is alert.  Psychiatric:        Mood and Affect: Mood normal.        Behavior: Behavior normal.     Consultants:  GI  Procedures:    Data Reviewed: I have personally reviewed labs and imaging studies     LOS: 2 days  Time spent: Greater than 50% of the 35 minute visit was spent in counseling/coordination of care for the patient as laid out in the A&P.   Dwyane Dee, MD [facility] 03/14/2021, 2:04 PM

## 2021-03-14 NOTE — Progress Notes (Signed)
Pt was transferred to Endo dept by this RN. Pt left the ED with protonix gtt still attached to pt's PICC line. Once in Endo, Protonix gtt was removed and the line was unsuccessfully flushed with saline. IV contacted and made aware. During this time the pt's heart rate became elevated in the 110's-120's. Anesthesia MD was in unit at that time and came to see the pt. Dr. Vicente Males also made aware. Dr. Sabino Gasser was made aware via secure chat that Cardizem gtt was stopped this morning at 0920 according to the ED nurse Bryna Colander, RN. Dr. Sabino Gasser ordered for PO Metoprolol to be given. See MAR and flowsheets for vital signs. Anesthesia and Dr. Vicente Males made decision to cancel procedure due to inadequate IV access (See IV team RN note) and uncontrolled heart rate. Pt received bed assignment and was transferred to room 236 and bedside report was given to Margaret, Therapist, sports.

## 2021-03-14 NOTE — Progress Notes (Signed)
Pt continued to have issues with her picc line after she was admitted to room 236; staff RN attempted to draw blood for lab work but had difficulty getting blood returns from various ports; Cardizem infusing to red port at the time;  spoke with Jake Samples, RN, PICC nurse at [phone]; it was recommended that the PICC be retracted 1cm and recheck again for blood returns and ability to flush with NS;  after retracting 1 cm, all 3 ports have great blood returns and flush very easily with NS.  Cardizem was put to the white port, allowing the Red port to be used for all lab draws.  Pt and daughter both relieved to know the PICC is working at this time.  RN also made aware.

## 2021-03-15 ENCOUNTER — Inpatient Hospital Stay: Payer: Medicare HMO | Admitting: Anesthesiology

## 2021-03-15 ENCOUNTER — Encounter
Admission: EM | Disposition: A | Discharge status: Home / Self Care | Payer: Self-pay | Source: Home / Self Care | Attending: Internal Medicine

## 2021-03-15 ENCOUNTER — Inpatient Hospital Stay (HOSPITAL_COMMUNITY)
Admit: 2021-03-15 | Discharge: 2021-03-15 | Disposition: A | Discharge status: Home / Self Care | Payer: Medicare HMO | Attending: Internal Medicine | Admitting: Internal Medicine

## 2021-03-15 DIAGNOSIS — K257 Chronic gastric ulcer without hemorrhage or perforation: Secondary | ICD-10-CM

## 2021-03-15 DIAGNOSIS — K921 Melena: Secondary | ICD-10-CM | POA: Diagnosis not present

## 2021-03-15 DIAGNOSIS — K2100 Gastro-esophageal reflux disease with esophagitis, without bleeding: Secondary | ICD-10-CM | POA: Diagnosis not present

## 2021-03-15 DIAGNOSIS — K449 Diaphragmatic hernia without obstruction or gangrene: Secondary | ICD-10-CM | POA: Diagnosis not present

## 2021-03-15 DIAGNOSIS — E039 Hypothyroidism, unspecified: Secondary | ICD-10-CM

## 2021-03-15 DIAGNOSIS — I4891 Unspecified atrial fibrillation: Secondary | ICD-10-CM | POA: Diagnosis not present

## 2021-03-15 HISTORY — PX: ESOPHAGOGASTRODUODENOSCOPY: SHX5428

## 2021-03-15 LAB — CBC WITH DIFFERENTIAL/PLATELET
Abs Immature Granulocytes: 0.02 10*3/uL (ref 0.00–0.07)
Basophils Absolute: 0 10*3/uL (ref 0.0–0.1)
Basophils Relative: 1 %
Eosinophils Absolute: 0.2 10*3/uL (ref 0.0–0.5)
Eosinophils Relative: 3 %
HCT: 24.6 % — ABNORMAL LOW (ref 36.0–46.0)
Hemoglobin: 8.2 g/dL — ABNORMAL LOW (ref 12.0–15.0)
Immature Granulocytes: 0 %
Lymphocytes Relative: 30 %
Lymphs Abs: 2.1 10*3/uL (ref 0.7–4.0)
MCH: 33.5 pg (ref 26.0–34.0)
MCHC: 33.3 g/dL (ref 30.0–36.0)
MCV: 100.4 fL — ABNORMAL HIGH (ref 80.0–100.0)
Monocytes Absolute: 0.6 10*3/uL (ref 0.1–1.0)
Monocytes Relative: 8 %
Neutro Abs: 4.2 10*3/uL (ref 1.7–7.7)
Neutrophils Relative %: 58 %
Platelets: 143 10*3/uL — ABNORMAL LOW (ref 150–400)
RBC: 2.45 MIL/uL — ABNORMAL LOW (ref 3.87–5.11)
RDW: 15 % (ref 11.5–15.5)
WBC: 7.1 10*3/uL (ref 4.0–10.5)
nRBC: 0 % (ref 0.0–0.2)

## 2021-03-15 LAB — BASIC METABOLIC PANEL
Anion gap: 3 — ABNORMAL LOW (ref 5–15)
BUN: 14 mg/dL (ref 8–23)
CO2: 30 mmol/L (ref 22–32)
Calcium: 8 mg/dL — ABNORMAL LOW (ref 8.9–10.3)
Chloride: 104 mmol/L (ref 98–111)
Creatinine, Ser: 0.67 mg/dL (ref 0.44–1.00)
GFR, Estimated: >60 mL/min (ref >60)
Glucose, Bld: 108 mg/dL — ABNORMAL HIGH (ref 70–99)
Potassium: 3.1 mmol/L — ABNORMAL LOW (ref 3.5–5.1)
Sodium: 137 mmol/L (ref 135–145)

## 2021-03-15 LAB — ECHOCARDIOGRAM COMPLETE
Area-P 1/2: 6.02 cm2
Height: 63 in
S' Lateral: 2.3 cm
Weight: 3361.57 oz

## 2021-03-15 LAB — MAGNESIUM: Magnesium: 1.6 mg/dL — ABNORMAL LOW (ref 1.7–2.4)

## 2021-03-15 SURGERY — EGD (ESOPHAGOGASTRODUODENOSCOPY)
Anesthesia: General

## 2021-03-15 MED ORDER — PROPOFOL 10 MG/ML IV BOLUS
INTRAVENOUS | Status: AC
  Filled 2021-03-15: qty 20

## 2021-03-15 MED ORDER — SERTRALINE HCL 50 MG PO TABS
50.0000 mg | ORAL_TABLET | Freq: Every day | ORAL | Status: DC
  Administered 2021-03-15 – 2021-03-19 (×5): 50 mg via ORAL
  Filled 2021-03-15 (×5): qty 1

## 2021-03-15 MED ORDER — SODIUM CHLORIDE 0.9 % IV SOLN
300.0000 mg | Freq: Once | INTRAVENOUS | Status: AC
  Administered 2021-03-15: 300 mg via INTRAVENOUS
  Filled 2021-03-15 (×2): qty 15

## 2021-03-15 MED ORDER — POTASSIUM CHLORIDE 10 MEQ/50ML IV SOLN
10.0000 meq | INTRAVENOUS | Status: AC
  Administered 2021-03-15 (×2): 10 meq via INTRAVENOUS
  Filled 2021-03-15 (×4): qty 50

## 2021-03-15 MED ORDER — IPRATROPIUM-ALBUTEROL 0.5-2.5 (3) MG/3ML IN SOLN
RESPIRATORY_TRACT | Status: AC
  Filled 2021-03-15: qty 3

## 2021-03-15 MED ORDER — PANTOPRAZOLE SODIUM 40 MG PO TBEC
40.0000 mg | DELAYED_RELEASE_TABLET | Freq: Two times a day (BID) | ORAL | Status: DC
  Administered 2021-03-15 – 2021-03-20 (×10): 40 mg via ORAL
  Filled 2021-03-15 (×10): qty 1

## 2021-03-15 MED ORDER — IPRATROPIUM-ALBUTEROL 0.5-2.5 (3) MG/3ML IN SOLN
3.0000 mL | Freq: Once | RESPIRATORY_TRACT | Status: AC
  Administered 2021-03-15: 12:00:00 3 mL via RESPIRATORY_TRACT

## 2021-03-15 MED ORDER — LEVOTHYROXINE SODIUM 50 MCG PO TABS
75.0000 ug | ORAL_TABLET | Freq: Every day | ORAL | Status: DC
  Administered 2021-03-16 – 2021-03-20 (×5): 75 ug via ORAL
  Filled 2021-03-15 (×5): qty 1

## 2021-03-15 MED ORDER — MAGNESIUM SULFATE 2 GM/50ML IV SOLN
2.0000 g | Freq: Once | INTRAVENOUS | Status: AC
  Administered 2021-03-15: 09:00:00 2 g via INTRAVENOUS
  Filled 2021-03-15: qty 50

## 2021-03-15 MED ORDER — PROPOFOL 10 MG/ML IV BOLUS
INTRAVENOUS | Status: DC | PRN
  Administered 2021-03-15 (×2): 20 mg via INTRAVENOUS
  Administered 2021-03-15: 100 mg via INTRAVENOUS

## 2021-03-15 MED ORDER — SODIUM CHLORIDE 0.9 % IV SOLN: INTRAVENOUS | Status: DC

## 2021-03-15 MED ORDER — POTASSIUM CHLORIDE 10 MEQ/50ML IV SOLN
10.0000 meq | INTRAVENOUS | Status: AC
  Administered 2021-03-15 (×2): 10 meq via INTRAVENOUS
  Filled 2021-03-15 (×2): qty 50

## 2021-03-15 MED ORDER — ASPIRIN EC 81 MG PO TBEC
81.0000 mg | DELAYED_RELEASE_TABLET | Freq: Every day | ORAL | Status: DC
  Administered 2021-03-16 – 2021-03-20 (×5): 81 mg via ORAL
  Filled 2021-03-15 (×5): qty 1

## 2021-03-15 MED ORDER — VITAMIN B-12 1000 MCG PO TABS
1000.0000 ug | ORAL_TABLET | Freq: Every day | ORAL | Status: DC
  Administered 2021-03-15 – 2021-03-20 (×6): 1000 ug via ORAL
  Filled 2021-03-15 (×6): qty 1

## 2021-03-15 MED ORDER — METOPROLOL TARTRATE 50 MG PO TABS
75.0000 mg | ORAL_TABLET | Freq: Two times a day (BID) | ORAL | Status: DC
  Administered 2021-03-15 – 2021-03-16 (×2): 75 mg via ORAL
  Filled 2021-03-15 (×3): qty 1

## 2021-03-15 MED ORDER — METOPROLOL TARTRATE 25 MG PO TABS
25.0000 mg | ORAL_TABLET | ORAL | Status: AC
  Administered 2021-03-15: 16:00:00 25 mg via ORAL
  Filled 2021-03-15: qty 1

## 2021-03-15 NOTE — H&P (Signed)
Cephas Darby, MD [address]  [place]  [place], [place] [address]  Main: [phone]  Fax: [phone] Pager: [phone]  Primary Care Physician:  Einar Pheasant, MD Primary Gastroenterologist:  Dr. Cephas Darby  Pre-Procedure History & Physical: HPI:  Amanda Browning is a 75 y.o. female is here for an endoscopy.   Past Medical History:  Diagnosis Date   Anemia    Chicken pox    Colon polyps    Complication of anesthesia    doesn't take much anesthesia to put pt to sleep, slow to wake up   DDD (degenerative disc disease), cervical 02/05/13   C5-C6 and C6-C7   GERD (gastroesophageal reflux disease)    History of chicken pox    Hypercholesterolemia    Hyperlipidemia    Hypertension    Hypothyroidism    Migraines    PONV (postoperative nausea and vomiting)    Squamous cell skin cancer     Past Surgical History:  Procedure Laterality Date   BREAST BIOPSY Right 2012   stereotactic   COLONOSCOPY WITH PROPOFOL N/A 02/23/2018   Procedure: COLONOSCOPY WITH PROPOFOL;  Surgeon: Manya Silvas, MD;  Location: [facility] ENDOSCOPY;  Service: Endoscopy;  Laterality: N/A;   KNEE ARTHROPLASTY Right 04/17/2017   Procedure: COMPUTER ASSISTED TOTAL KNEE ARTHROPLASTY;  Surgeon: Dereck Leep, MD;  Location: [facility] ORS;  Service: Orthopedics;  Laterality: Right;   SKIN BIOPSY     VAGINAL HYSTERECTOMY  1994   ovaries not removed   WISDOM TOOTH EXTRACTION      Prior to Admission medications   Medication Sig Start Date End Date Taking? Authorizing Provider  amLODipine (NORVASC) 5 MG tablet Take 1 tablet (5 mg total) by mouth daily. 10/16/20  Yes Einar Pheasant, MD  aspirin EC 81 MG tablet Take 81 mg by mouth daily.   Yes [provider]  cetirizine (ZYRTEC) 10 MG tablet Take 10 mg by mouth daily.   Yes [provider]  ferrous sulfate 325 (65 FE) MG tablet Take 325 mg by mouth daily with breakfast.   Yes [provider]  levothyroxine  (SYNTHROID) 50 MCG tablet TAKE 1 TABLET EVERY DAY BEFORE BREAKFAST 09/11/20  Yes Einar Pheasant, MD  lisinopril-hydrochlorothiazide (ZESTORETIC) 20-25 MG tablet TAKE 1 TABLET EVERY DAY 08/21/20  Yes Einar Pheasant, MD  lovastatin (MEVACOR) 40 MG tablet TAKE 1 TABLET AT BEDTIME 07/06/20  Yes Einar Pheasant, MD  magnesium oxide (MAG-OX) 400 MG tablet Take 1 tablet (400 mg total) by mouth 2 (two) times daily. 01/24/17  Yes Einar Pheasant, MD  Multiple Vitamin (MULTIVITAMIN) tablet Take 1 tablet by mouth daily.   Yes [provider]  omeprazole (PRILOSEC) 20 MG capsule TAKE 1 CAPSULE TWICE DAILY BEFORE MEALS 09/21/20  Yes Einar Pheasant, MD  sertraline (ZOLOFT) 50 MG tablet TAKE 1/2 TABLET DAILY FOR 1 WEEK, THEN TAKE 1 TABLET DAILY Patient taking differently: Take 50 mg by mouth at bedtime. 09/22/20  Yes Einar Pheasant, MD  vitamin B-12 (CYANOCOBALAMIN) 1000 MCG tablet Take 1 tablet by mouth daily.   Yes [provider]  acetaminophen (TYLENOL) 325 MG tablet Take 650 mg by mouth 4 (four) times daily as needed.    [provider]  albuterol (VENTOLIN HFA) 108 (90 Base) MCG/ACT inhaler Inhale 2 puffs into the lungs every 6 (six) hours as needed for wheezing or shortness of breath. 03/10/21   Einar Pheasant, MD  glucosamine-chondroitin 500-400 MG tablet Take 1 tablet by mouth daily. Patient not taking:  Reported on 03/12/2021    [provider]    Allergies as of 03/12/2021 - Review Complete 03/12/2021  Allergen Reaction Noted   Codeine Other (See Comments) 03/19/2012    Family History  Problem Relation Age of Onset   Heart disease Mother    Stroke Mother    Hypertension Mother    Heart attack Mother    Heart disease Father        enlarged heart   Stroke Father    Hypertension Father    Hyperlipidemia Father    Heart attack Father    Ovarian cancer Paternal Aunt    Breast cancer Sister 34   Cancer Sister        Breast Cancer   Diabetes type II Brother     Hypertension Brother    Colon cancer Neg Hx    Basal cell carcinoma Neg Hx    Melanoma Neg Hx    Squamous cell carcinoma Neg Hx     Social History   Socioeconomic History   Marital status: Divorced    Spouse name: Not on file   Number of children: 2   Years of education: 12   Highest education level: High school graduate  Occupational History   Not on file  Tobacco Use   Smoking status: Never   Smokeless tobacco: Never  Vaping Use   Vaping Use: Never used  Substance and Sexual Activity   Alcohol use: No    Alcohol/week: 0.0 standard drinks   Drug use: No   Sexual activity: Never  Other Topics Concern   Not on file  Social History Narrative   Full Code   Alcohol - none   Never smoker and no smokeless tobacco   2 daughters   Social Determinants of Health   Financial Resource Strain: Not on file  Food Insecurity: Not on file  Transportation Needs: Not on file  Physical Activity: Not on file  Stress: Not on file  Social Connections: Not on file  Intimate Partner Violence: Not on file    Review of Systems: See HPI, otherwise negative ROS  Physical Exam: BP 125/65   Pulse 86   Temp (!) 97.5 F (36.4 C) (Tympanic)   Resp 18   Ht 5\' 3"  (1.6 m)   Wt 95.3 kg   SpO2 96%   BMI 37.22 kg/m  General:   Alert,  pleasant and cooperative in NAD Head:  Normocephalic and atraumatic. Neck:  Supple; no masses or thyromegaly. Lungs:  Clear throughout to auscultation.    Heart:  Regular rate and rhythm. Abdomen:  Soft, nontender and nondistended. Normal bowel sounds, without guarding, and without rebound.   Neurologic:  Alert and  oriented x4;  grossly normal neurologically.  Impression/Plan: Amanda Browning is here for an endoscopy to be performed for melena, anemia  Risks, benefits, limitations, and alternatives regarding  endoscopy have been reviewed with the patient.  Questions have been answered.  All parties agreeable.   Sherri Sear, MD  03/15/2021, 11:03  AM

## 2021-03-15 NOTE — Transfer of Care (Signed)
Immediate Anesthesia Transfer of Care Note  Patient: Amanda Browning  Procedure(s) Performed: ESOPHAGOGASTRODUODENOSCOPY (EGD)  Patient Location: PACU and Endoscopy Unit  Anesthesia Type:General  Level of Consciousness: awake, alert  and oriented  Airway & Oxygen Therapy: Patient Spontanous Breathing  Post-op Assessment: Report given to RN and Post -op Vital signs reviewed and stable  Post vital signs: Reviewed and stable  Last Vitals:  Vitals Value Taken Time  BP 121/74 03/15/21 1130  Temp 36.3 C 03/15/21 1130  Pulse 101 03/15/21 1139  Resp 14 03/15/21 1139  SpO2 100 % 03/15/21 1139  Vitals shown include unvalidated device data.  Last Pain:  Vitals:   03/15/21 1130  TempSrc: Temporal  PainSc: 0-No pain      Patients Stated Pain Goal: 0 (84/85/92 7639)  Complications: No notable events documented.

## 2021-03-15 NOTE — Anesthesia Postprocedure Evaluation (Signed)
Anesthesia Post Note  Patient: Amanda Browning  Procedure(s) Performed: ESOPHAGOGASTRODUODENOSCOPY (EGD)  Patient location during evaluation: PACU Anesthesia Type: General Level of consciousness: awake and alert, oriented and patient cooperative Pain management: pain level controlled Vital Signs Assessment: post-procedure vital signs reviewed and stable Respiratory status: spontaneous breathing, nonlabored ventilation and respiratory function stable Cardiovascular status: blood pressure returned to baseline and stable Postop Assessment: adequate PO intake Anesthetic complications: no   No notable events documented.   Last Vitals:  Vitals:   03/15/21 1150 03/15/21 1200  BP: 118/90 (!) 128/108  Pulse: (!) 103 (!) 109  Resp: (!) 22 (!) 31  Temp:    SpO2: 93% 95%    Last Pain:  Vitals:   03/15/21 1200  TempSrc:   PainSc: 0-No pain                 Darrin Nipper

## 2021-03-15 NOTE — Care Management Important Message (Signed)
Important Message  Patient Details  Name: Amanda Browning MRN: [id number] Date of Birth: [birth year]   Medicare Important Message Given:  Yes  Patient out of room upon time of visit, no family.  Copy of Medicare IM left in room for reference.    Dannette Arita 03/15/2021, 11:38 AM

## 2021-03-15 NOTE — Op Note (Signed)
[facility] Gastroenterology Patient Name: Amanda Browning Procedure Date: 03/15/2021 10:50 AM MRN: [id number] Account #: [id number] Date of Birth: July 09, 1945 Admit Type: Outpatient Age: 75 Room: [facility] ENDO ROOM 1 Gender: Female Note Status: Finalized Instrument Name: Upper Endoscope [id number] Procedure:             Upper GI endoscopy Indications:           Iron deficiency anemia, Melena Providers:             Lin Landsman MD, MD Referring MD:          Einar Pheasant, MD (Referring MD) Medicines:             General Anesthesia Complications:         No immediate complications. Estimated blood loss: None. Procedure:             Pre-Anesthesia Assessment:                        - Prior to the procedure, a History and Physical was                         performed, and patient medications and allergies were                         reviewed. The patient is competent. The risks and                         benefits of the procedure and the sedation options and                         risks were discussed with the patient. All questions                         were answered and informed consent was obtained.                         Patient identification and proposed procedure were                         verified by the physician, the nurse, the                         anesthesiologist, the anesthetist and the technician                         in the pre-procedure area in the procedure room in the                         endoscopy suite. Mental Status Examination: alert and                         oriented. Airway Examination: normal oropharyngeal                         airway and neck mobility. Respiratory Examination:                         clear to auscultation. CV Examination: normal.  Prophylactic Antibiotics: The patient does not require                         prophylactic antibiotics. Prior Anticoagulants: The                          patient has taken no previous anticoagulant or                         antiplatelet agents. ASA Grade Assessment: III - A                         patient with severe systemic disease. After reviewing                         the risks and benefits, the patient was deemed in                         satisfactory condition to undergo the procedure. The                         anesthesia plan was to use general anesthesia.                         Immediately prior to administration of medications,                         the patient was re-assessed for adequacy to receive                         sedatives. The heart rate, respiratory rate, oxygen                         saturations, blood pressure, adequacy of pulmonary                         ventilation, and response to care were monitored                         throughout the procedure. The physical status of the                         patient was re-assessed after the procedure.                        After obtaining informed consent, the endoscope was                         passed under direct vision. Throughout the procedure,                         the patient's blood pressure, pulse, and oxygen                         saturations were monitored continuously. The Endoscope                         was introduced through the mouth, and advanced to the  second part of duodenum. The upper GI endoscopy was                         accomplished without difficulty. The patient tolerated                         the procedure well. Findings:      The duodenal bulb and second portion of the duodenum were normal.      A large hiatal hernia with multiple clean based Amanda Browning ulcers was       found. The proximal extent of the gastric folds (end of tubular       esophagus) was 30 cm from the incisors. The Z-line was 30 cm from the       incisors. Biopsies were taken with a cold forceps for Helicobacter       pylori testing.       LA Grade D (one or more mucosal breaks involving at least 75% of       esophageal circumference) esophagitis with superficial clean based       ulcers with no bleeding was found in the lower third of the esophagus. Impression:            - Normal duodenal bulb and second portion of the                         duodenum.                        - Large hiatal hernia with multiple Cameron ulcers.                         Biopsied.                        - LA Grade D reflux esophagitis with no bleeding. Recommendation:        - Return patient to hospital ward for ongoing care.                        - Cardiac diet today.                        - Continue present medications.                        - Await pathology results.                        - Use Prilosec (omeprazole) 40 mg PO BID indefinitely.                        - Refer to CT surgeon at appointment to be scheduled                         to discuss about hernia repair. Procedure Code(s):     --- Professional ---                        [phone], Esophagogastroduodenoscopy, flexible,                         transoral; with biopsy, single  or multiple Diagnosis Code(s):     --- Professional ---                        K44.9, Diaphragmatic hernia without obstruction or                         gangrene                        K25.9, Gastric ulcer, unspecified as acute or chronic,                         without hemorrhage or perforation                        K21.00, Gastro-esophageal reflux disease with                         esophagitis, without bleeding                        D50.9, Iron deficiency anemia, unspecified                        K92.1, Melena (includes Hematochezia) CPT copyright 2019 American Medical Association. All rights reserved. The codes documented in this report are preliminary and upon coder review may  be revised to meet current compliance requirements. Dr. Ulyess Mort Lin Landsman MD, MD 03/15/2021  11:30:44 AM This report has been signed electronically. Number of Addenda: 0 Note Initiated On: 03/15/2021 10:50 AM Estimated Blood Loss:  Estimated blood loss: none.      [facility]

## 2021-03-15 NOTE — Progress Notes (Signed)
Progress Note    ATHINA FAHEY   URK:[id number]  DOB: [birth year]  DOA: 03/12/2021     3 PCP: Einar Pheasant, MD  Initial CC: dark stools, coffee ground emesis  Hospital Course: [name] is a 75 yo female with PMH GERD, HLD, hypothyroidism, anemia who presented to the ER with dark stools and dark-colored vomiting that started the morning of admission. On work-up in the ER she was found to be hypotensive, tachycardic, hypothermic. Initial hemoglobin noted to be 9 g/dL.  WBC 13.1, platelet count 218. BUN 72, creatinine 0.93. She also developed A. fib with RVR and was started on a Cardizem drip.  1 unit of blood was ordered but due to tachycardia, this was canceled.  Hemoglobin further down trended the morning following admission to 7.2 g/dL and she was again ordered 1 unit PRBC for transfusion. GI was consulted on admission for further evaluation; she underwent EGD on 03/15/21.   Interval History:  No events overnight.  Remains in A. fib but Cardizem drip has been weaned some since yesterday.  Underwent EGD this morning as well.  Assessment & Plan: * GI bleed - presented with coffee ground emesis and melena; on asa only; no other obvious meds. Imaging does show hiatal hernia - Hgb 7.2 g/dL and patient underwent 1 unit PRBC on 11/26, responded well -Underwent EGD on 03/15/2021.  Findings revealed large hiatal hernia with multiple clean-based Cameron ulcers noted.  Grade D esophagitis with clean-based ulcers. -Patient recommended to continue on Prilosec twice daily indefinitely per GI  Atrial fibrillation with RVR ([place]) - no prior known history; likely precipitated by GIB at this point - continue cardizem drip; use PRN lopressor as well - start oral lopressor; will adjust as necessary - continue attempting to wean cardizem drip - CHA2DS2-VASc = 4 pts. Afib remaining persistent at this time -Echo obtained: EF 55 to 60%, mild LVH.  Indeterminate diastolic parameters. - notify  primary team if any interruption to treatment; EGD delayed due to PICC clogging without primary team notification  Iron deficiency anemia - iron studies okay at this time; s/p venofer on 11/28  Macrocytic anemia - iron studies adequate - B12 346 - folate 37 - venofer ordered per GI on 11/28  Hypothermia-resolved as of 03/13/2021 - Improved, possibly due to hypovolemia on admission  SIRS (systemic inflammatory response syndrome) ([place]) - low suspicion for infection; likely due to GIB  Hiatal hernia - large HH noted; continue prilosec  Hypothyroidism - Resume Synthroid once work-up complete - TSH 6.2 (elevated, possibly from acute illness) - check FT4 (lower normal, 0.64) and TT3 - will increase synthroid to 75 mcg daily; repeat TSH in 4-6 weeks   Hypertension - Hold home BP meds now in setting of hypotension  Prediabetes - last A1c 5.9% on 01/12/21 - continue diet control     Old records reviewed in assessment of this patient  Antimicrobials:   DVT prophylaxis: SCD  Code Status:   Code Status: Full Code  Disposition Plan:   Status is: Inpatient   Objective: Blood pressure (!) 128/108, pulse (!) 109, temperature (!) 97.4 F (36.3 C), temperature source Temporal, resp. rate (!) 31, height 5\' 3"  (1.6 m), weight 95.3 kg, SpO2 95 %.  Examination:  Physical Exam Constitutional:      General: She is not in acute distress.    Appearance: Normal appearance.  HENT:     Head: Normocephalic and atraumatic.     Mouth/Throat:     Mouth: Mucous membranes  are moist.  Eyes:     Extraocular Movements: Extraocular movements intact.  Cardiovascular:     Rate and Rhythm: Normal rate. Rhythm irregular.     Heart sounds: No murmur heard. Pulmonary:     Effort: Pulmonary effort is normal. No respiratory distress.     Breath sounds: Normal breath sounds.  Abdominal:     General: Bowel sounds are normal. There is no distension.     Palpations: Abdomen is soft.      Tenderness: There is no abdominal tenderness.  Musculoskeletal:        General: Normal range of motion.     Cervical back: Normal range of motion and neck supple.  Skin:    General: Skin is warm and dry.  Neurological:     General: No focal deficit present.     Mental Status: She is alert.  Psychiatric:        Mood and Affect: Mood normal.        Behavior: Behavior normal.     Consultants:  GI  Procedures:  EGD, 03/15/2021  Data Reviewed: I have personally reviewed labs and imaging studies     LOS: 3 days  Time spent: Greater than 50% of the 35 minute visit was spent in counseling/coordination of care for the patient as laid out in the A&P.   Dwyane Dee, MD [facility] 03/15/2021, 2:27 PM

## 2021-03-15 NOTE — Progress Notes (Signed)
*  PRELIMINARY RESULTS* Echocardiogram 2D Echocardiogram has been performed.  Amanda Browning 03/15/2021, 8:33 AM

## 2021-03-15 NOTE — Anesthesia Preprocedure Evaluation (Addendum)
Anesthesia Evaluation  Patient identified by MRN, date of birth, ID band Patient awake    Reviewed: Allergy & Precautions, NPO status , Patient's Chart, lab work & pertinent test results  History of Anesthesia Complications (+) PONV and history of anesthetic complications  Airway Mallampati: IV   Neck ROM: Full    Dental   Missing many teeth:   Pulmonary neg pulmonary ROS,    Pulmonary exam normal breath sounds clear to auscultation       Cardiovascular hypertension, Normal cardiovascular exam+ dysrhythmias (a fib with RVR, new this admission; HR now controlled on diltiazem and metoprolol)  Rhythm:Regular Rate:Normal  ECG 03/14/21:  Atrial fibrillation (HR 101) Low voltage, precordial leads Prolonged QT interval  Echo 03/15/21:  EF 55-60% Mild LVH Moderately elevated pulmonary artery systolic pressure Moderate dilation of LA and RA Mild MR, TR   Neuro/Psych negative neurological ROS     GI/Hepatic hiatal hernia, GERD  Medicated,GI bleed   Endo/Other  Hypothyroidism Prediabetes; obesity  Renal/GU negative Renal ROS     Musculoskeletal   Abdominal   Peds  Hematology  (+) Blood dyscrasia, anemia ,   Anesthesia Other Findings   Reproductive/Obstetrics                           Anesthesia Physical Anesthesia Plan  ASA: 3  Anesthesia Plan: General   Post-op Pain Management:    Induction: Intravenous  PONV Risk Score and Plan: 2 and Propofol infusion, TIVA and Treatment may vary due to age or medical condition  Airway Management Planned: Natural Airway  Additional Equipment:   Intra-op Plan:   Post-operative Plan:   Informed Consent: I have reviewed the patients History and Physical, chart, labs and discussed the procedure including the risks, benefits and alternatives for the proposed anesthesia with the patient or authorized representative who has indicated his/her  understanding and acceptance.       Plan Discussed with: CRNA  Anesthesia Plan Comments: (LMA/GETA backup discussed.  Patient consented for risks of anesthesia including but not limited to:  - adverse reactions to medications - damage to eyes, teeth, lips or other oral mucosa - nerve damage due to positioning  - sore throat or hoarseness - damage to heart, brain, nerves, lungs, other parts of body or loss of life  Informed patient about role of CRNA in peri- and intra-operative care.  Patient voiced understanding.)        Anesthesia Quick Evaluation

## 2021-03-16 ENCOUNTER — Encounter: Payer: Self-pay | Admitting: Gastroenterology

## 2021-03-16 DIAGNOSIS — I4891 Unspecified atrial fibrillation: Secondary | ICD-10-CM | POA: Diagnosis not present

## 2021-03-16 DIAGNOSIS — K921 Melena: Secondary | ICD-10-CM | POA: Diagnosis not present

## 2021-03-16 LAB — BASIC METABOLIC PANEL
Anion gap: 4 — ABNORMAL LOW (ref 5–15)
BUN: 11 mg/dL (ref 8–23)
CO2: 28 mmol/L (ref 22–32)
Calcium: 8.4 mg/dL — ABNORMAL LOW (ref 8.9–10.3)
Chloride: 104 mmol/L (ref 98–111)
Creatinine, Ser: 0.8 mg/dL (ref 0.44–1.00)
GFR, Estimated: >60 mL/min (ref >60)
Glucose, Bld: 118 mg/dL — ABNORMAL HIGH (ref 70–99)
Potassium: 3.5 mmol/L (ref 3.5–5.1)
Sodium: 136 mmol/L (ref 135–145)

## 2021-03-16 LAB — CBC WITH DIFFERENTIAL/PLATELET
Abs Immature Granulocytes: 0.04 10*3/uL (ref 0.00–0.07)
Basophils Absolute: 0 10*3/uL (ref 0.0–0.1)
Basophils Relative: 0 %
Eosinophils Absolute: 0.2 10*3/uL (ref 0.0–0.5)
Eosinophils Relative: 2 %
HCT: 25.7 % — ABNORMAL LOW (ref 36.0–46.0)
Hemoglobin: 8.5 g/dL — ABNORMAL LOW (ref 12.0–15.0)
Immature Granulocytes: 0 %
Lymphocytes Relative: 24 %
Lymphs Abs: 2.2 10*3/uL (ref 0.7–4.0)
MCH: 33.5 pg (ref 26.0–34.0)
MCHC: 33.1 g/dL (ref 30.0–36.0)
MCV: 101.2 fL — ABNORMAL HIGH (ref 80.0–100.0)
Monocytes Absolute: 0.7 10*3/uL (ref 0.1–1.0)
Monocytes Relative: 8 %
Neutro Abs: 6 10*3/uL (ref 1.7–7.7)
Neutrophils Relative %: 66 %
Platelets: 150 10*3/uL (ref 150–400)
RBC: 2.54 MIL/uL — ABNORMAL LOW (ref 3.87–5.11)
RDW: 15.3 % (ref 11.5–15.5)
WBC: 9.1 10*3/uL (ref 4.0–10.5)
nRBC: 0.2 % (ref 0.0–0.2)

## 2021-03-16 LAB — MAGNESIUM: Magnesium: 1.7 mg/dL (ref 1.7–2.4)

## 2021-03-16 LAB — T3: T3, Total: 67 ng/dL — ABNORMAL LOW (ref 71–180)

## 2021-03-16 MED ORDER — METOPROLOL TARTRATE 50 MG PO TABS
100.0000 mg | ORAL_TABLET | Freq: Two times a day (BID) | ORAL | Status: DC
  Administered 2021-03-16 – 2021-03-20 (×8): 100 mg via ORAL
  Filled 2021-03-16 (×7): qty 2

## 2021-03-16 MED ORDER — METOPROLOL TARTRATE 25 MG PO TABS
25.0000 mg | ORAL_TABLET | Freq: Once | ORAL | Status: AC
  Administered 2021-03-16: 25 mg via ORAL

## 2021-03-16 MED ORDER — SODIUM CHLORIDE 0.9 % IV BOLUS
1000.0000 mL | Freq: Once | INTRAVENOUS | Status: AC
  Administered 2021-03-16: 1000 mL via INTRAVENOUS

## 2021-03-16 MED ORDER — METOPROLOL TARTRATE 25 MG PO TABS
ORAL_TABLET | ORAL | Status: AC
  Filled 2021-03-16: qty 1

## 2021-03-16 MED ORDER — DILTIAZEM HCL-DEXTROSE 125-5 MG/125ML-% IV SOLN (PREMIX)
5.0000 mg/h | INTRAVENOUS | Status: DC
  Administered 2021-03-16: 5 mg/h via INTRAVENOUS

## 2021-03-16 NOTE — Progress Notes (Signed)
Mobility Specialist - Progress Note   03/16/21 1600  Mobility  Activity Contraindicated/medical hold  Mobility performed by Mobility specialist    Per RN, hold this date d/t elevated HR ranging 120-130s resting. Will attempt session another date/time as medically appropriate.    Kathee Delton Mobility Specialist 03/16/21, 4:19 PM

## 2021-03-16 NOTE — Progress Notes (Signed)
Progress Note    Amanda Browning   CHE:[id number]  DOB: [birth year]  DOA: 03/12/2021     4 PCP: Einar Pheasant, MD  Initial CC: dark stools, coffee ground emesis  Hospital Course: Amanda Browning is a 75 yo female with PMH GERD, HLD, hypothyroidism, anemia who presented to the ER with dark stools and dark-colored vomiting that started the morning of admission. On work-up in the ER she was found to be hypotensive, tachycardic, hypothermic. Initial hemoglobin noted to be 9 g/dL.  WBC 13.1, platelet count 218. BUN 72, creatinine 0.93. She also developed A. fib with RVR and was started on a Cardizem drip.  1 unit of blood was ordered but due to tachycardia, this was canceled.  Hemoglobin further down trended the morning following admission to 7.2 g/dL and she was again ordered 1 unit PRBC for transfusion. GI was consulted on admission for further evaluation; she underwent EGD on 03/15/21.   Interval History:  No events overnight.  Still RVR when ambulating but recovers quickly at rest. We discussed remaining 1 more night as lopressor further increased today.   Assessment & Plan: * GI bleed - presented with coffee ground emesis and melena; on asa only; no other obvious meds. Imaging does show hiatal hernia - Hgb 7.2 g/dL and patient underwent 1 unit PRBC on 11/26, responded well -Underwent EGD on 03/15/2021.  Findings revealed large hiatal hernia with multiple clean-based Cameron ulcers noted.  Grade D esophagitis with clean-based ulcers. -Patient recommended to continue on Prilosec twice daily indefinitely per GI  Atrial fibrillation with RVR ([place]) - no prior known history; likely precipitated by GIB at this point - continue cardizem drip; use PRN lopressor as well - start oral lopressor; will adjust as necessary - cardizem drip off but HR does elevate very easily with minimal mobility but recovers at rest - CHA2DS2-VASc = 4 pts. Afib remaining persistent at this time -Echo obtained: EF  55 to 60%, mild LVH.  Indeterminate diastolic parameters. - discussed with cardiology, would wait 1-2 weeks for anticoagulation regardless given recent GIB workup - she has outpt followup with cardiology next week - continue 1 more night in hospital; increased lopressor to 100 mg BID. If rate still difficult to control tomorrow, may need inpt cardiology evaluation for consideration of amiodarone as well  Iron deficiency anemia - iron studies okay at this time; s/p venofer on 11/28  Hypothyroidism - Resume Synthroid once work-up complete - TSH 6.2 (elevated, possibly from acute illness) - check FT4 (lower normal, 0.64) and TT3 (low, 67) - will increase synthroid to 75 mcg daily; repeat TSH in 4-6 weeks   Macrocytic anemia - iron studies adequate - B12 346 - folate 37 - venofer ordered per GI on 11/28  Hypothermia-resolved as of 03/13/2021 - Improved, possibly due to hypovolemia on admission  SIRS (systemic inflammatory response syndrome) ([facility]) - low suspicion for infection; likely due to GIB  Hiatal hernia - large HH noted; continue prilosec  Hypertension - Hold home BP meds now in setting of hypotension  Prediabetes - last A1c 5.9% on 01/12/21 - continue diet control     Old records reviewed in assessment of this patient  Antimicrobials:   DVT prophylaxis: SCD  Code Status:   Code Status: Full Code  Disposition Plan:   Status is: Inpatient   Objective: Blood pressure (!) 120/92, pulse (!) 126, temperature 98 F (36.7 C), temperature source Oral, resp. rate 18, height 5\' 3"  (1.6 m), weight 95.3 kg, SpO2 99 %.  Examination:  Physical Exam Constitutional:      General: She is not in acute distress.    Appearance: Normal appearance.  HENT:     Head: Normocephalic and atraumatic.     Mouth/Throat:     Mouth: Mucous membranes are moist.  Eyes:     Extraocular Movements: Extraocular movements intact.  Cardiovascular:     Rate and Rhythm: Normal rate. Rhythm  irregular.     Heart sounds: No murmur heard. Pulmonary:     Effort: Pulmonary effort is normal. No respiratory distress.     Breath sounds: Normal breath sounds.  Abdominal:     General: Bowel sounds are normal. There is no distension.     Palpations: Abdomen is soft.     Tenderness: There is no abdominal tenderness.  Musculoskeletal:        General: Normal range of motion.     Cervical back: Normal range of motion and neck supple.  Skin:    General: Skin is warm and dry.  Neurological:     General: No focal deficit present.     Mental Status: She is alert.  Psychiatric:        Mood and Affect: Mood normal.        Behavior: Behavior normal.     Consultants:  GI  Procedures:  EGD, 03/15/2021  Data Reviewed: I have personally reviewed labs and imaging studies     LOS: 4 days  Time spent: Greater than 50% of the 35 minute visit was spent in counseling/coordination of care for the patient as laid out in the A&P.   Dwyane Dee, MD [facility] 03/16/2021, 4:45 PM

## 2021-03-16 NOTE — Progress Notes (Addendum)
Patient ambulated around the unit again.  Patient complained of worsening SOB with ambulation.  Patient's heart rate jumped back up and sustained above 120(130s-140s)  After 30 minutes, I noticed that the patient's HR continued to stay in the 130s-140s..  A PRN IV dose of lopressor was given to the patient and the MD was made aware.  We will continue to monitor.   After about an hour, her HR did not improve. The MD was paged again.  We will restart the Cardizem gtt for now.  Per the MD, will stop try to stop the drip again after the evening dose of metoprolol.  Passed on this information to the evening RN.

## 2021-03-16 NOTE — Progress Notes (Addendum)
Cardizem drip stopped around 9 am.  Noted patient's HR jumped back up into the 140s while ambulating around the unit.   Her HR improved with rest (80s-90s).  MD was made aware A NS bolus was ordered and the dose of metoprolol was increased   Will continue to monitor her heart rate and I will recheck it again with ambulation around noon

## 2021-03-17 DIAGNOSIS — I10 Essential (primary) hypertension: Secondary | ICD-10-CM

## 2021-03-17 DIAGNOSIS — K449 Diaphragmatic hernia without obstruction or gangrene: Secondary | ICD-10-CM | POA: Diagnosis not present

## 2021-03-17 DIAGNOSIS — I4891 Unspecified atrial fibrillation: Secondary | ICD-10-CM | POA: Diagnosis not present

## 2021-03-17 LAB — BASIC METABOLIC PANEL
Anion gap: 3 — ABNORMAL LOW (ref 5–15)
BUN: 11 mg/dL (ref 8–23)
CO2: 27 mmol/L (ref 22–32)
Calcium: 8.3 mg/dL — ABNORMAL LOW (ref 8.9–10.3)
Chloride: 105 mmol/L (ref 98–111)
Creatinine, Ser: 0.73 mg/dL (ref 0.44–1.00)
GFR, Estimated: >60 mL/min (ref >60)
Glucose, Bld: 101 mg/dL — ABNORMAL HIGH (ref 70–99)
Potassium: 3.5 mmol/L (ref 3.5–5.1)
Sodium: 135 mmol/L (ref 135–145)

## 2021-03-17 LAB — CBC WITH DIFFERENTIAL/PLATELET
Abs Immature Granulocytes: 0.04 10*3/uL (ref 0.00–0.07)
Basophils Absolute: 0 10*3/uL (ref 0.0–0.1)
Basophils Relative: 0 %
Eosinophils Absolute: 0.4 10*3/uL (ref 0.0–0.5)
Eosinophils Relative: 4 %
HCT: 25.4 % — ABNORMAL LOW (ref 36.0–46.0)
Hemoglobin: 8.4 g/dL — ABNORMAL LOW (ref 12.0–15.0)
Immature Granulocytes: 0 %
Lymphocytes Relative: 27 %
Lymphs Abs: 2.5 10*3/uL (ref 0.7–4.0)
MCH: 33.6 pg (ref 26.0–34.0)
MCHC: 33.1 g/dL (ref 30.0–36.0)
MCV: 101.6 fL — ABNORMAL HIGH (ref 80.0–100.0)
Monocytes Absolute: 0.8 10*3/uL (ref 0.1–1.0)
Monocytes Relative: 9 %
Neutro Abs: 5.5 10*3/uL (ref 1.7–7.7)
Neutrophils Relative %: 60 %
Platelets: 165 10*3/uL (ref 150–400)
RBC: 2.5 MIL/uL — ABNORMAL LOW (ref 3.87–5.11)
RDW: 15.4 % (ref 11.5–15.5)
WBC: 9.2 10*3/uL (ref 4.0–10.5)
nRBC: 0.3 % — ABNORMAL HIGH (ref 0.0–0.2)

## 2021-03-17 LAB — SURGICAL PATHOLOGY

## 2021-03-17 LAB — MAGNESIUM: Magnesium: 1.6 mg/dL — ABNORMAL LOW (ref 1.7–2.4)

## 2021-03-17 MED ORDER — DILTIAZEM HCL 30 MG PO TABS
30.0000 mg | ORAL_TABLET | Freq: Four times a day (QID) | ORAL | Status: DC
  Administered 2021-03-17 – 2021-03-18 (×7): 30 mg via ORAL
  Filled 2021-03-17 (×7): qty 1

## 2021-03-17 NOTE — Progress Notes (Signed)
PROGRESS NOTE    Amanda Browning  YCX:[id number] DOB: [birth year] DOA: 03/12/2021 PCP: Amanda Pheasant, MD   Brief Narrative: Taken from prior notes. Amanda Browning is a 75 yo female with PMH GERD, HLD, hypothyroidism, anemia who presented to the ER with dark stools and dark-colored vomiting that started the morning of admission. On work-up in the ER she was found to be hypotensive, tachycardic, hypothermic. Initial hemoglobin noted to be 9 g/dL.  WBC 13.1, platelet count 218.  Later decreased to 7.2 and she received 1 unit of PRBC. GI was consulted and patient underwent EGD on 03/15/2021 with no sign of active bleeding.  They are recommending twice daily PPI. Patient also developed new onset atrial fibrillation with RVR, requiring Cardizem infusion.  Cardiology was consulted and Cardizem infusion was transition to p.o. Cardizem with Lopressor.  Subjective: Patient was seen and examined today.  Sitting comfortably in chair.  Denies any chest pain or shortness of breath.  She wants to see cardiology before discharge.  Assessment & Plan:   Principal Problem:   GI bleed Active Problems:   Hypertension   Macrocytic anemia   Hypothyroidism   Prediabetes   Hiatal hernia   SIRS (systemic inflammatory response syndrome) (HCC)   Iron deficiency anemia   Atrial fibrillation with RVR ([place])   Cameron ulcer, chronic   Hiatal hernia with GERD and esophagitis  GI bleed.  Received 1 unit of PRBC on 03/13/2021.  Identified EGD on 03/15/2021 with GI, find to have a large hiatal hernia with multiple clean-based Cameron ulcers.  No active bleeding.  Grade D esophagitis was diagnosed.  They are recommending twice daily PPI. Hemoglobin currently stable. -Continue to monitor  New onset A. fib with RVR.  Initially requiring Cardizem infusion.  Rate improved and Cardizem infusion was switched with p.o. Cardizem by cardiology. CHA2DS2-VASc = 4 pts Echocardiogram with normal EF, mild LVH and indeterminate  diastolic parameters. Holding anticoagulation due to recent GI bleed-can be given a challenge as an outpatient.  Macrocytic anemia.  Iron studies within normal limit at this time, B12 at 346, folate 37.  S/p Venofer on 11/28.  Hypothyroidism.  Elevated TSH with low T3.  Synthroid dose was increased to 75 MCG daily. -Patient will need a repeat TSH in 4 to 6 weeks  Hypertension.  Blood pressure within goal. -Continue Cardizem and Lopressor -Might have to discontinue home meds on discharge.  Prediabetes.  A1c of 5.9 on 01/12/2021 -Continue with diet control  Objective: Vitals:   03/17/21 1011 03/17/21 1129 03/17/21 1318 03/17/21 1629  BP:  110/83 129/86 104/76  Pulse:  (!) 111  (!) 103  Resp: (!) 22 18 18 18   Temp:  97.8 F (36.6 C) 98 F (36.7 C) 98.1 F (36.7 C)  TempSrc:  Oral Oral   SpO2:  95% 97% 100%  Weight:      Height:        Intake/Output Summary (Last 24 hours) at 03/17/2021 1748 Last data filed at 03/17/2021 1627 Gross per 24 hour  Intake 660 ml  Output 1100 ml  Net -440 ml   Filed Weights   03/12/21 1303 03/14/21 1139  Weight: 95.3 kg 95.3 kg    Examination:  General exam: Appears calm and comfortable  Respiratory system: Clear to auscultation. Respiratory effort normal. Cardiovascular system: Irregularly irregular Gastrointestinal system: Soft, nontender, nondistended, bowel sounds positive. Central nervous system: Alert and oriented. No focal neurological deficits. Extremities: No edema, no cyanosis, pulses intact and symmetrical. Psychiatry: Judgement and  insight appear normal.    DVT prophylaxis: SCDs Code Status: Full Family Communication: Discussed with daughter at bedside Disposition Plan:  Status is: Inpatient  Remains inpatient appropriate because: Of the severity of illness  Level of care: Progressive  All the records are reviewed and case discussed with Care Management/Social Worker. Management plans discussed with the patient,  nursing and they are in agreement.  Consultants:  Cardiology  Procedures:  Antimicrobials:   Data Reviewed: I have personally reviewed following labs and imaging studies  CBC: Recent Labs  Lab 03/12/21 1309 03/13/21 0723 03/13/21 1907 03/14/21 0950 03/15/21 0500 03/16/21 0500 03/17/21 0527  WBC 13.1* 11.2*  --  9.0 7.1 9.1 9.2  NEUTROABS 11.7*  --   --   --  4.2 6.0 5.5  HGB 9.0* 7.2* 9.0* 8.7* 8.2* 8.5* 8.4*  HCT 29.0* 22.3* 27.6* 25.9* 24.6* 25.7* 25.4*  MCV 106.2* 102.8*  --  99.6 100.4* 101.2* 101.6*  PLT 218 195  --  149* 143* 150 329   Basic Metabolic Panel: Recent Labs  Lab 03/13/21 0723 03/14/21 0950 03/15/21 0500 03/16/21 0500 03/17/21 0527  NA 139 137 137 136 135  K 3.6 3.3* 3.1* 3.5 3.5  CL 108 106 104 104 105  CO2 25 28 30 28 27   GLUCOSE 113* 109* 108* 118* 101*  BUN 62* 25* 14 11 11   CREATININE 0.95 0.78 0.67 0.80 0.73  CALCIUM 8.1* 8.3* 8.0* 8.4* 8.3*  MG 1.8 1.8 1.6* 1.7 1.6*  PHOS 3.3  --   --   --   --    GFR: Estimated Creatinine Clearance: 66.8 mL/min (by C-G formula based on SCr of 0.73 mg/dL). Liver Function Tests: Recent Labs  Lab 03/12/21 1309 03/13/21 0723  AST 20 16  ALT 18 15  ALKPHOS 41 36*  BILITOT 1.0 0.7  PROT 5.8* 5.2*  ALBUMIN 3.2* 3.0*   No results for input(s): LIPASE, AMYLASE in the last 168 hours. No results for input(s): AMMONIA in the last 168 hours. Coagulation Profile: Recent Labs  Lab 03/12/21 1309  INR 1.2   Cardiac Enzymes: No results for input(s): CKTOTAL, CKMB, CKMBINDEX, TROPONINI in the last 168 hours. BNP (last 3 results) No results for input(s): PROBNP in the last 8760 hours. HbA1C: No results for input(s): HGBA1C in the last 72 hours. CBG: No results for input(s): GLUCAP in the last 168 hours. Lipid Profile: No results for input(s): CHOL, HDL, LDLCALC, TRIG, CHOLHDL, LDLDIRECT in the last 72 hours. Thyroid Function Tests: No results for input(s): TSH, T4TOTAL, FREET4, T3FREE, THYROIDAB in  the last 72 hours. Anemia Panel: No results for input(s): VITAMINB12, FOLATE, FERRITIN, TIBC, IRON, RETICCTPCT in the last 72 hours. Sepsis Labs: No results for input(s): PROCALCITON, LATICACIDVEN in the last 168 hours.  Recent Results (from the past 240 hour(s))  Resp Panel by RT-PCR (Flu A&B, Covid) Nasopharyngeal Swab     Status: None   Collection Time: 03/12/21  1:45 PM   Specimen: Nasopharyngeal Swab; Nasopharyngeal(NP) swabs in vial transport medium  Result Value Ref Range Status   SARS Coronavirus 2 by RT PCR NEGATIVE NEGATIVE Final    Comment: (NOTE) SARS-CoV-2 target nucleic acids are NOT DETECTED.  The SARS-CoV-2 RNA is generally detectable in upper respiratory specimens during the acute phase of infection. The lowest concentration of SARS-CoV-2 viral copies this assay can detect is 138 copies/mL. A negative result does not preclude SARS-Cov-2 infection and should not be used as the sole basis for treatment or other patient management  decisions. A negative result may occur with  improper specimen collection/handling, submission of specimen other than nasopharyngeal swab, presence of viral mutation(s) within the areas targeted by this assay, and inadequate number of viral copies(<138 copies/mL). A negative result must be combined with clinical observations, patient history, and epidemiological information. The expected result is Negative.  Fact Sheet for Patients:  EntrepreneurPulse.com.au  Fact Sheet for Healthcare Providers:  IncredibleEmployment.be  This test is no t yet approved or cleared by the [place] FDA and  has been authorized for detection and/or diagnosis of SARS-CoV-2 by FDA under an Emergency Use Authorization (EUA). This EUA will remain  in effect (meaning this test can be used) for the duration of the COVID-19 declaration under Section 564(b)(1) of the Act, 21 U.S.C.section 360bbb-3(b)(1), unless the  authorization is terminated  or revoked sooner.       Influenza A by PCR NEGATIVE NEGATIVE Final   Influenza B by PCR NEGATIVE NEGATIVE Final    Comment: (NOTE) The Xpert Xpress SARS-CoV-2/FLU/RSV plus assay is intended as an aid in the diagnosis of influenza from Nasopharyngeal swab specimens and should not be used as a sole basis for treatment. Nasal washings and aspirates are unacceptable for Xpert Xpress SARS-CoV-2/FLU/RSV testing.  Fact Sheet for Patients: EntrepreneurPulse.com.au  Fact Sheet for Healthcare Providers: IncredibleEmployment.be  This test is not yet approved or cleared by the [place] FDA and has been authorized for detection and/or diagnosis of SARS-CoV-2 by FDA under an Emergency Use Authorization (EUA). This EUA will remain in effect (meaning this test can be used) for the duration of the COVID-19 declaration under Section 564(b)(1) of the Act, 21 U.S.C. section 360bbb-3(b)(1), unless the authorization is terminated or revoked.  Performed at [facility], [address] [place], [place] [address]      Radiology Studies: No results found.  Scheduled Meds:  aspirin EC  81 mg Oral Daily   Chlorhexidine Gluconate Cloth  6 each Topical Daily   diltiazem  30 mg Oral QID   feeding supplement (GLUCERNA SHAKE)  237 mL Oral TID BM   levothyroxine  75 mcg Oral Q0600   metoprolol tartrate  100 mg Oral BID   pantoprazole  40 mg Oral BID   sertraline  50 mg Oral QHS   sodium chloride flush  10-40 mL Intracatheter Q12H   vitamin B-12  1,000 mcg Oral Daily   Continuous Infusions:   LOS: 5 days   Time spent: 40 minutes. More than 50% of the time was spent in counseling/coordination of care  Lorella Nimrod, MD [facility]  If 7PM-7AM, please contact night-coverage [email or web address]  03/17/2021, 5:48 PM   This record has been created using Systems analyst. Errors have been sought  and corrected,but may not always be located. Such creation errors do not reflect on the standard of care.

## 2021-03-17 NOTE — Consult Note (Signed)
Cardiology Consultation:   Patient ID: Amanda Browning MRN: [id number]; DOB: [birth year]  Admit date: 03/12/2021 Date of Consult: 03/17/2021  PCP:  Amanda Pheasant, MD   [facility] Providers Cardiologist: New - [place] rounding   Patient Profile:   Amanda Browning is a 75 y.o. female with a hx of hypertension, hyperlipidemia who is being seen 03/17/2021 for the evaluation of A. fib RVR at the request of Dr. Reesa Chew.  History of Present Illness:   Amanda Browning is a 75 year old female with history of hypertension, hyperlipidemia presenting with GI bleeding.  Patient was at home and 4 days ago when she noticed blood in her stools.  She also felt weak, clammy, blood pressures were low, prompting her to present to the emergency room.  In the ED, blood work revealed hemoglobin of 7.2.  She was transfused with packed red blood cells.  EKG showed atrial fibrillation with rapid ventricular response.  Started on oral beta-blockers for heart rate control.  She subsequently underwent EGD showing hiatal hernia with multiple ulcers and reflux esophagitis.  No active bleeding noted.   Past Medical History:  Diagnosis Date   Anemia    Chicken pox    Colon polyps    Complication of anesthesia    doesn't take much anesthesia to put pt to sleep, slow to wake up   DDD (degenerative disc disease), cervical 02/05/13   C5-C6 and C6-C7   GERD (gastroesophageal reflux disease)    History of chicken pox    Hypercholesterolemia    Hyperlipidemia    Hypertension    Hypothyroidism    Migraines    PONV (postoperative nausea and vomiting)    Squamous cell skin cancer     Past Surgical History:  Procedure Laterality Date   BREAST BIOPSY Right 2012   stereotactic   COLONOSCOPY WITH PROPOFOL N/A 02/23/2018   Procedure: COLONOSCOPY WITH PROPOFOL;  Surgeon: Manya Silvas, MD;  Location: [facility] ENDOSCOPY;  Service: Endoscopy;  Laterality: N/A;   ESOPHAGOGASTRODUODENOSCOPY N/A 03/15/2021    Procedure: ESOPHAGOGASTRODUODENOSCOPY (EGD);  Surgeon: Lin Landsman, MD;  Location: [facility] ENDOSCOPY;  Service: Gastroenterology;  Laterality: N/A;   KNEE ARTHROPLASTY Right 04/17/2017   Procedure: COMPUTER ASSISTED TOTAL KNEE ARTHROPLASTY;  Surgeon: Dereck Leep, MD;  Location: [facility] ORS;  Service: Orthopedics;  Laterality: Right;   SKIN BIOPSY     VAGINAL HYSTERECTOMY  1994   ovaries not removed   WISDOM TOOTH EXTRACTION       Home Medications:  Prior to Admission medications   Medication Sig Start Date End Date Taking? Authorizing Provider  amLODipine (NORVASC) 5 MG tablet Take 1 tablet (5 mg total) by mouth daily. 10/16/20  Yes Amanda Pheasant, MD  aspirin EC 81 MG tablet Take 81 mg by mouth daily.   Yes [provider]  cetirizine (ZYRTEC) 10 MG tablet Take 10 mg by mouth daily.   Yes [provider]  ferrous sulfate 325 (65 FE) MG tablet Take 325 mg by mouth daily with breakfast.   Yes [provider]  levothyroxine (SYNTHROID) 50 MCG tablet TAKE 1 TABLET EVERY DAY BEFORE BREAKFAST 09/11/20  Yes Amanda Pheasant, MD  lisinopril-hydrochlorothiazide (ZESTORETIC) 20-25 MG tablet TAKE 1 TABLET EVERY DAY 08/21/20  Yes Amanda Pheasant, MD  lovastatin (MEVACOR) 40 MG tablet TAKE 1 TABLET AT BEDTIME 07/06/20  Yes Amanda Pheasant, MD  magnesium oxide (MAG-OX) 400 MG tablet Take 1 tablet (400 mg total) by mouth 2 (two) times daily. 01/24/17  Yes Amanda Pheasant, MD  Multiple Vitamin (MULTIVITAMIN) tablet Take 1 tablet by mouth daily.   Yes [provider]  omeprazole (PRILOSEC) 20 MG capsule TAKE 1 CAPSULE TWICE DAILY BEFORE MEALS 09/21/20  Yes Amanda Pheasant, MD  sertraline (ZOLOFT) 50 MG tablet TAKE 1/2 TABLET DAILY FOR 1 WEEK, THEN TAKE 1 TABLET DAILY Patient taking differently: Take 50 mg by mouth at bedtime. 09/22/20  Yes Amanda Pheasant, MD  vitamin B-12 (CYANOCOBALAMIN) 1000 MCG tablet Take 1 tablet by mouth daily.   Yes [provider]   acetaminophen (TYLENOL) 325 MG tablet Take 650 mg by mouth 4 (four) times daily as needed.    [provider]  albuterol (VENTOLIN HFA) 108 (90 Base) MCG/ACT inhaler Inhale 2 puffs into the lungs every 6 (six) hours as needed for wheezing or shortness of breath. 03/10/21   Amanda Pheasant, MD  glucosamine-chondroitin 500-400 MG tablet Take 1 tablet by mouth daily. Patient not taking: Reported on 03/12/2021    [provider]    Inpatient Medications: Scheduled Meds:  aspirin EC  81 mg Oral Daily   Chlorhexidine Gluconate Cloth  6 each Topical Daily   diltiazem  30 mg Oral QID   feeding supplement (GLUCERNA SHAKE)  237 mL Oral TID BM   levothyroxine  75 mcg Oral Q0600   metoprolol tartrate  100 mg Oral BID   pantoprazole  40 mg Oral BID   sertraline  50 mg Oral QHS   sodium chloride flush  10-40 mL Intracatheter Q12H   vitamin B-12  1,000 mcg Oral Daily   Continuous Infusions:  PRN Meds: dicyclomine, metoprolol tartrate, sodium chloride flush, sodium chloride flush  Allergies:    Allergies  Allergen Reactions   Codeine Other (See Comments)    Can not function Other reaction(s): Other (See Comments) Altered mental status- "Cannot function" Can not function Can not function    Social History:   Social History   Socioeconomic History   Marital status: Divorced    Spouse name: Not on file   Number of children: 2   Years of education: 12   Highest education level: High school graduate  Occupational History   Not on file  Tobacco Use   Smoking status: Never   Smokeless tobacco: Never  Vaping Use   Vaping Use: Never used  Substance and Sexual Activity   Alcohol use: No    Alcohol/week: 0.0 standard drinks   Drug use: No   Sexual activity: Never  Other Topics Concern   Not on file  Social History Narrative   Full Code   Alcohol - none   Never smoker and no smokeless tobacco   2 daughters   Social Determinants of Health   Financial Resource  Strain: Not on file  Food Insecurity: Not on file  Transportation Needs: Not on file  Physical Activity: Not on file  Stress: Not on file  Social Connections: Not on file  Intimate Partner Violence: Not on file    Family History:    Family History  Problem Relation Age of Onset   Heart disease Mother    Stroke Mother    Hypertension Mother    Heart attack Mother    Heart disease Father        enlarged heart   Stroke Father    Hypertension Father    Hyperlipidemia Father    Heart attack Father    Ovarian cancer Paternal Aunt    Breast cancer Sister 57   Cancer Sister  Breast Cancer   Diabetes type II Brother    Hypertension Brother    Colon cancer Neg Hx    Basal cell carcinoma Neg Hx    Melanoma Neg Hx    Squamous cell carcinoma Neg Hx      ROS:  Please see the history of present illness.   All other ROS reviewed and negative.     Physical Exam/Data:   Vitals:   03/17/21 0954 03/17/21 1011 03/17/21 1129 03/17/21 1318  BP:   110/83 129/86  Pulse:   (!) 111   Resp: 20 (!) 22 18 18   Temp:   97.8 F (36.6 C) 98 F (36.7 C)  TempSrc:   Oral Oral  SpO2:   95% 97%  Weight:      Height:        Intake/Output Summary (Last 24 hours) at 03/17/2021 1447 Last data filed at 03/17/2021 1406 Gross per 24 hour  Intake 660 ml  Output 900 ml  Net -240 ml   Last 3 Weights 03/14/2021 03/12/2021 01/12/2021  Weight (lbs) 210 lb 1.6 oz 210 lb 215 lb  Weight (kg) 95.3 kg 95.255 kg 97.523 kg     Body mass index is 37.22 kg/m.  General:  Well nourished, well developed, in no acute distress HEENT: normal Neck: no JVD Vascular: No carotid bruits; Distal pulses 2+ bilaterally Cardiac: Irregularly irregular, no murmurs. Lungs:  clear to auscultation bilaterally, no wheezing, rhonchi or rales  Abd: soft, nontender, no hepatomegaly  Ext: no edema Musculoskeletal:  No deformities, BUE and BLE strength normal and equal Skin: warm and dry  Neuro:  CNs 2-12 intact, no  focal abnormalities noted Psych:  Normal affect   EKG:  The EKG was personally reviewed and demonstrates: Atrial fibrillation heart rate 165 Telemetry:  Telemetry was personally reviewed and demonstrates: Atrial fibrillation, heart rate 126  Relevant CV Studies: TTE 02/2021 1. Left ventricular ejection fraction, by estimation, is 55 to 60%. The  left ventricle has normal function. The left ventricle has no regional  wall motion abnormalities. There is mild left ventricular hypertrophy.  Left ventricular diastolic parameters  are indeterminate.   2. Right ventricular systolic function is normal. The right ventricular  size is normal. There is moderately elevated pulmonary artery systolic  pressure. The estimated right ventricular systolic pressure is 45.8 mmHg.   3. Left atrial size was moderately dilated.   4. Right atrial size was moderately dilated.   5. The mitral valve is normal in structure. Mild mitral valve  regurgitation. No evidence of mitral stenosis.   6. Tricuspid valve regurgitation is moderate.   7. The aortic valve is normal in structure. Aortic valve regurgitation is  not visualized. No aortic stenosis is present.   Laboratory Data:  High Sensitivity Troponin:   Recent Labs  Lab 03/14/21 0950  TROPONINIHS 12     Chemistry Recent Labs  Lab 03/15/21 0500 03/16/21 0500 03/17/21 0527  NA 137 136 135  K 3.1* 3.5 3.5  CL 104 104 105  CO2 30 28 27   GLUCOSE 108* 118* 101*  BUN 14 11 11   CREATININE 0.67 0.80 0.73  CALCIUM 8.0* 8.4* 8.3*  MG 1.6* 1.7 1.6*  GFRNONAA >60 >60 >60  ANIONGAP 3* 4* 3*    Recent Labs  Lab 03/12/21 1309 03/13/21 0723  PROT 5.8* 5.2*  ALBUMIN 3.2* 3.0*  AST 20 16  ALT 18 15  ALKPHOS 41 36*  BILITOT 1.0 0.7   Lipids No results for input(s):  CHOL, TRIG, HDL, LABVLDL, LDLCALC, CHOLHDL in the last 168 hours.  Hematology Recent Labs  Lab 03/15/21 0500 03/16/21 0500 03/17/21 0527  WBC 7.1 9.1 9.2  RBC 2.45* 2.54* 2.50*   HGB 8.2* 8.5* 8.4*  HCT 24.6* 25.7* 25.4*  MCV 100.4* 101.2* 101.6*  MCH 33.5 33.5 33.6  MCHC 33.3 33.1 33.1  RDW 15.0 15.3 15.4  PLT 143* 150 165   Thyroid  Recent Labs  Lab 03/14/21 0950 03/14/21 1404  TSH 6.280*  --   FREET4  --  0.64    BNPNo results for input(s): BNP, PROBNP in the last 168 hours.  DDimer No results for input(s): DDIMER in the last 168 hours.   Radiology/Studies:  DG Chest Port 1 View  Result Date: 03/14/2021 CLINICAL DATA:  PICC line placement EXAM: PORTABLE CHEST 1 VIEW COMPARISON:  Portable exam 1303 hours compared to 03/13/2021 FINDINGS: RIGHT arm PICC line with tip projecting over SVC. Enlargement of cardiac silhouette. Atherosclerotic calcification aorta. Lungs clear. No pulmonary infiltrate, pleural effusion, or pneumothorax. Osseous structures unremarkable. IMPRESSION: Tip of RIGHT arm PICC line projects over SVC. Enlargement of cardiac silhouette. Aortic Atherosclerosis (ICD10-I70.0). Electronically Signed   By: Lavonia Dana M.D.   On: 03/14/2021 13:44   DG CHEST PORT 1 VIEW  Result Date: 03/13/2021 CLINICAL DATA:  PICC line placement EXAM: PORTABLE CHEST 1 VIEW COMPARISON:  Chest x-ray 03/12/2021. FINDINGS: Right PICC line with tip overlying the expected region of distal superior vena cava. Prominent cardiac silhouette which may be due to portable AP technique. Otherwise the heart and mediastinal contours are within normal limits. Large hiatal hernia noted. No focal consolidation. No pulmonary edema. No pleural effusion. No pneumothorax. No acute osseous abnormality. IMPRESSION: 1. Right PICC line with tip overlying the expected region of distal superior vena cava. 2. Large hiatal hernia. Electronically Signed   By: Iven Finn M.D.   On: 03/13/2021 19:20   ECHOCARDIOGRAM COMPLETE  Result Date: 03/15/2021    ECHOCARDIOGRAM REPORT   Patient Name:   Amanda Browning Date of Exam: 03/15/2021 Medical Rec #:  [id number]         Height:       63.0 in  Accession #:    [id number]        Weight:       210.1 lb Date of Birth:  1945-07-26         BSA:          1.975 m Patient Age:    18 years          BP:           100/57 mmHg Patient Gender: F                 HR:           76 bpm. Exam Location:  [facility] Procedure: 2D Echo, Color Doppler and Cardiac Doppler Indications:     Atrial Fibrillation I48.91  History:         Patient has no prior history of Echocardiogram examinations.                  Risk Factors:Hypertension and Dyslipidemia.  Sonographer:     Sherrie Sport Referring Phys:  [place] Diagnosing Phys: Kathlyn Sacramento MD IMPRESSIONS  1. Left ventricular ejection fraction, by estimation, is 55 to 60%. The left ventricle has normal function. The left ventricle has no regional wall motion abnormalities. There is mild left ventricular hypertrophy. Left ventricular diastolic  parameters are indeterminate.  2. Right ventricular systolic function is normal. The right ventricular size is normal. There is moderately elevated pulmonary artery systolic pressure. The estimated right ventricular systolic pressure is 76.1 mmHg.  3. Left atrial size was moderately dilated.  4. Right atrial size was moderately dilated.  5. The mitral valve is normal in structure. Mild mitral valve regurgitation. No evidence of mitral stenosis.  6. Tricuspid valve regurgitation is moderate.  7. The aortic valve is normal in structure. Aortic valve regurgitation is not visualized. No aortic stenosis is present. FINDINGS  Left Ventricle: Left ventricular ejection fraction, by estimation, is 55 to 60%. The left ventricle has normal function. The left ventricle has no regional wall motion abnormalities. The left ventricular internal cavity size was normal in size. There is  mild left ventricular hypertrophy. Left ventricular diastolic parameters are indeterminate. Right Ventricle: The right ventricular size is normal. No increase in right ventricular wall thickness. Right ventricular systolic  function is normal. There is moderately elevated pulmonary artery systolic pressure. The tricuspid regurgitant velocity is 3.22 m/s, and with an assumed right atrial pressure of 5 mmHg, the estimated right ventricular systolic pressure is 60.7 mmHg. Left Atrium: Left atrial size was moderately dilated. Right Atrium: Right atrial size was moderately dilated. Pericardium: There is no evidence of pericardial effusion. Mitral Valve: The mitral valve is normal in structure. Mild mitral valve regurgitation. No evidence of mitral valve stenosis. Tricuspid Valve: The tricuspid valve is normal in structure. Tricuspid valve regurgitation is moderate . No evidence of tricuspid stenosis. Aortic Valve: The aortic valve is normal in structure. Aortic valve regurgitation is not visualized. No aortic stenosis is present. Pulmonic Valve: The pulmonic valve was normal in structure. Pulmonic valve regurgitation is not visualized. No evidence of pulmonic stenosis. Aorta: The aortic root is normal in size and structure. Venous: The inferior vena cava was not well visualized. IAS/Shunts: No atrial level shunt detected by color flow Doppler.  LEFT VENTRICLE PLAX 2D LVIDd:         3.20 cm LVIDs:         2.30 cm LV PW:         1.40 cm LV IVS:        1.05 cm  RIGHT VENTRICLE RV Basal diam:  4.40 cm RV S prime:     14.70 cm/s TAPSE (M-mode): 3.3 cm LEFT ATRIUM              Index        RIGHT ATRIUM           Index LA diam:        4.50 cm  2.28 cm/m   RA Area:     23.00 cm LA Vol (A2C):   106.0 ml 53.68 ml/m  RA Volume:   72.60 ml  36.77 ml/m LA Vol (A4C):   111.0 ml 56.22 ml/m LA Biplane Vol: 117.0 ml 59.25 ml/m                        PULMONIC VALVE AORTA                 PV Vmax:        0.84 m/s Ao Root diam: 3.37 cm PV Vmean:       57.800 cm/s                       PV VTI:  0.169 m                       PV Peak grad:   2.8 mmHg                       PV Mean grad:   2.0 mmHg                       RVOT Peak grad: 5 mmHg  MITRAL  VALVE                TRICUSPID VALVE MV Area (PHT): 6.02 cm     TR Peak grad:   41.5 mmHg MV Decel Time: 126 msec     TR Vmax:        322.00 cm/s MV E velocity: 110.00 cm/s                             SHUNTS                             Pulmonic VTI: 0.177 m Kathlyn Sacramento MD Electronically signed by Kathlyn Sacramento MD Signature Date/Time: 03/15/2021/8:42:16 AM    Final      Assessment and Plan:   A. fib RVR -Heart rate still elevated -CHA2DS2-VASc of 4 (age, htn, gender) -Start Cardizem 30 mg every 6 hours.  Consolidate if heart rate well controlled. -Continue Lopressor. -Echo with normal EF. -Avoiding anticoagulation due to GI bleed requiring blood transfusion. -Consider challenge with NOAC after okay with GI. -Can also consider watchman as outpatient if patient not tolerating anticoagulation long-term.  2.  Hypertension -BP controlled -Lopressor 100 mg twice daily, Cardizem as above.  3.  GI bleed -On PPI as per GI.  Total encounter time 110 minutes or more  Greater than 50% was spent in counseling and coordination of care with the patient   Signed, Kate Sable, MD  03/17/2021 2:47 PM

## 2021-03-17 NOTE — Progress Notes (Signed)
Consulted for  R TL PICC occlusion,  two lumens are completely clogged , one lumen working and infusing w/ cardizem. RN changed all caps and attempted repositioning,unsuccessful ,two lumens still occluded. TPA'd on11/27, reported that PICC has been having issues since inserted. Will inform PICC Nurse.

## 2021-03-18 DIAGNOSIS — I272 Pulmonary hypertension, unspecified: Secondary | ICD-10-CM

## 2021-03-18 DIAGNOSIS — K922 Gastrointestinal hemorrhage, unspecified: Secondary | ICD-10-CM | POA: Diagnosis not present

## 2021-03-18 DIAGNOSIS — I5031 Acute diastolic (congestive) heart failure: Secondary | ICD-10-CM

## 2021-03-18 DIAGNOSIS — I10 Essential (primary) hypertension: Secondary | ICD-10-CM | POA: Diagnosis not present

## 2021-03-18 DIAGNOSIS — I4891 Unspecified atrial fibrillation: Secondary | ICD-10-CM | POA: Diagnosis not present

## 2021-03-18 DIAGNOSIS — K449 Diaphragmatic hernia without obstruction or gangrene: Secondary | ICD-10-CM | POA: Diagnosis not present

## 2021-03-18 LAB — CBC WITH DIFFERENTIAL/PLATELET
Abs Immature Granulocytes: 0.04 10*3/uL (ref 0.00–0.07)
Basophils Absolute: 0.1 10*3/uL (ref 0.0–0.1)
Basophils Relative: 1 %
Eosinophils Absolute: 0.4 10*3/uL (ref 0.0–0.5)
Eosinophils Relative: 4 %
HCT: 26.3 % — ABNORMAL LOW (ref 36.0–46.0)
Hemoglobin: 8.4 g/dL — ABNORMAL LOW (ref 12.0–15.0)
Immature Granulocytes: 0 %
Lymphocytes Relative: 26 %
Lymphs Abs: 2.4 10*3/uL (ref 0.7–4.0)
MCH: 32.7 pg (ref 26.0–34.0)
MCHC: 31.9 g/dL (ref 30.0–36.0)
MCV: 102.3 fL — ABNORMAL HIGH (ref 80.0–100.0)
Monocytes Absolute: 0.8 10*3/uL (ref 0.1–1.0)
Monocytes Relative: 8 %
Neutro Abs: 5.6 10*3/uL (ref 1.7–7.7)
Neutrophils Relative %: 61 %
Platelets: 187 10*3/uL (ref 150–400)
RBC: 2.57 MIL/uL — ABNORMAL LOW (ref 3.87–5.11)
RDW: 16 % — ABNORMAL HIGH (ref 11.5–15.5)
WBC: 9.3 10*3/uL (ref 4.0–10.5)
nRBC: 0.2 % (ref 0.0–0.2)

## 2021-03-18 LAB — BASIC METABOLIC PANEL
Anion gap: 5 (ref 5–15)
BUN: 12 mg/dL (ref 8–23)
CO2: 29 mmol/L (ref 22–32)
Calcium: 8.6 mg/dL — ABNORMAL LOW (ref 8.9–10.3)
Chloride: 104 mmol/L (ref 98–111)
Creatinine, Ser: 0.78 mg/dL (ref 0.44–1.00)
GFR, Estimated: >60 mL/min (ref >60)
Glucose, Bld: 101 mg/dL — ABNORMAL HIGH (ref 70–99)
Potassium: 3.8 mmol/L (ref 3.5–5.1)
Sodium: 138 mmol/L (ref 135–145)

## 2021-03-18 LAB — MAGNESIUM: Magnesium: 1.5 mg/dL — ABNORMAL LOW (ref 1.7–2.4)

## 2021-03-18 MED ORDER — MAGNESIUM SULFATE 4 GM/100ML IV SOLN
4.0000 g | Freq: Once | INTRAVENOUS | Status: AC
  Administered 2021-03-18: 4 g via INTRAVENOUS
  Filled 2021-03-18: qty 100

## 2021-03-18 MED ORDER — POLYETHYLENE GLYCOL 3350 17 G PO PACK
17.0000 g | PACK | Freq: Two times a day (BID) | ORAL | Status: DC
  Administered 2021-03-18 – 2021-03-20 (×3): 17 g via ORAL
  Filled 2021-03-18 (×4): qty 1

## 2021-03-18 MED ORDER — FUROSEMIDE 10 MG/ML IJ SOLN
40.0000 mg | Freq: Every day | INTRAMUSCULAR | Status: DC
  Administered 2021-03-18 – 2021-03-20 (×3): 40 mg via INTRAVENOUS
  Filled 2021-03-18 (×3): qty 4

## 2021-03-18 MED ORDER — DIGOXIN 0.25 MG/ML IJ SOLN
0.2500 mg | Freq: Four times a day (QID) | INTRAMUSCULAR | Status: AC
  Administered 2021-03-18 (×2): 0.25 mg via INTRAVENOUS
  Filled 2021-03-18 (×2): qty 2

## 2021-03-18 MED ORDER — POTASSIUM CHLORIDE CRYS ER 20 MEQ PO TBCR
40.0000 meq | EXTENDED_RELEASE_TABLET | Freq: Once | ORAL | Status: AC
  Administered 2021-03-18: 40 meq via ORAL
  Filled 2021-03-18: qty 2

## 2021-03-18 NOTE — Progress Notes (Signed)
PROGRESS NOTE    Amanda Browning  LZJ:[id number] DOB: [birth year] DOA: 03/12/2021 PCP: Einar Pheasant, MD   Brief Narrative: Taken from prior notes. Amanda Browning is a 75 yo female with PMH GERD, HLD, hypothyroidism, anemia who presented to the ER with dark stools and dark-colored vomiting that started the morning of admission. On work-up in the ER she was found to be hypotensive, tachycardic, hypothermic. Initial hemoglobin noted to be 9 g/dL.  WBC 13.1, platelet count 218.  Later decreased to 7.2 and she received 1 unit of PRBC. GI was consulted and patient underwent EGD on 03/15/2021 with no sign of active bleeding.  They are recommending twice daily PPI. Patient also developed new onset atrial fibrillation with RVR, requiring Cardizem infusion.  Cardiology was consulted and Cardizem infusion was transition to p.o. Cardizem with Lopressor.  12/1: Due to persistent RVR with minor exertion, cardiology added digoxin.  Subjective: Patient was sitting comfortably in chair when seen today.  Heart rate continue to increase with minor exertion.  Patient wants to go home, daughter at bedside and would like to adjust her medications to prevent another visit to ED.  Assessment & Plan:   Principal Problem:   GI bleed Active Problems:   Hypertension   Macrocytic anemia   Hypothyroidism   Prediabetes   Hiatal hernia   SIRS (systemic inflammatory response syndrome) (HCC)   Iron deficiency anemia   Atrial fibrillation with RVR ([place])   Cameron ulcer, chronic   Hiatal hernia with GERD and esophagitis  GI bleed.  Received 1 unit of PRBC on 03/13/2021.  Identified EGD on 03/15/2021 with GI, find to have a large hiatal hernia with multiple clean-based Cameron ulcers.  No active bleeding.  Grade D esophagitis was diagnosed.  They are recommending twice daily PPI. Hemoglobin currently stable. -Continue to monitor  New onset A. fib with RVR.  Initially requiring Cardizem infusion.  Rate improved  and Cardizem infusion was switched with p.o. Cardizem by cardiology. CHA2DS2-VASc = 4 pts Echocardiogram with normal EF, mild LVH and indeterminate diastolic parameters. Holding anticoagulation due to recent GI bleed-can be given a challenge as an outpatient. -Cardiology added digoxin due to persistent RVR with minor exertion.  Macrocytic anemia.  Iron studies within normal limit at this time, B12 at 346, folate 37.  S/p Venofer on 11/28.  Hypothyroidism.  Elevated TSH with low T3.  Synthroid dose was increased to 75 MCG daily. -Patient will need a repeat TSH in 4 to 6 weeks  Hypertension.  Blood pressure within goal. -Continue Cardizem and Lopressor -Might have to discontinue home meds on discharge.  Prediabetes.  A1c of 5.9 on 01/12/2021 -Continue with diet control  Objective: Vitals:   03/17/21 2322 03/18/21 0332 03/18/21 0736 03/18/21 1133  BP: (!) 86/54 101/73 113/87 124/71  Pulse: 81 94 (!) 108 91  Resp: 18 15 18 18   Temp: 98 F (36.7 C) 98.8 F (37.1 C) 98 F (36.7 C) 98.1 F (36.7 C)  TempSrc:   Oral   SpO2: 96% 98% 95% 97%  Weight:      Height:        Intake/Output Summary (Last 24 hours) at 03/18/2021 1619 Last data filed at 03/18/2021 1511 Gross per 24 hour  Intake 1853.48 ml  Output 800 ml  Net 1053.48 ml    Filed Weights   03/12/21 1303 03/14/21 1139  Weight: 95.3 kg 95.3 kg    Examination:  General.  Well-developed elderly lady, in no acute distress. Pulmonary.  Lungs  clear bilaterally, normal respiratory effort. CV.  Irregularly irregular Abdomen.  Soft, nontender, nondistended, BS positive. CNS.  Alert and oriented .  No focal neurologic deficit. Extremities.  No edema, no cyanosis, pulses intact and symmetrical. Psychiatry.  Judgment and insight appears normal.    DVT prophylaxis: SCDs Code Status: Full Family Communication: Discussed with daughter at bedside Disposition Plan:  Status is: Inpatient  Remains inpatient appropriate because:  Of the severity of illness  Level of care: Progressive  All the records are reviewed and case discussed with Care Management/Social Worker. Management plans discussed with the patient, nursing and they are in agreement.  Consultants:  Cardiology  Procedures:  Antimicrobials:   Data Reviewed: I have personally reviewed following labs and imaging studies  CBC: Recent Labs  Lab 03/12/21 1309 03/13/21 0723 03/14/21 0950 03/15/21 0500 03/16/21 0500 03/17/21 0527 03/18/21 0628  WBC 13.1*   < > 9.0 7.1 9.1 9.2 9.3  NEUTROABS 11.7*  --   --  4.2 6.0 5.5 5.6  HGB 9.0*   < > 8.7* 8.2* 8.5* 8.4* 8.4*  HCT 29.0*   < > 25.9* 24.6* 25.7* 25.4* 26.3*  MCV 106.2*   < > 99.6 100.4* 101.2* 101.6* 102.3*  PLT 218   < > 149* 143* 150 165 187   < > = values in this interval not displayed.    Basic Metabolic Panel: Recent Labs  Lab 03/13/21 0723 03/14/21 0950 03/15/21 0500 03/16/21 0500 03/17/21 0527 03/18/21 0628  NA 139 137 137 136 135 138  K 3.6 3.3* 3.1* 3.5 3.5 3.8  CL 108 106 104 104 105 104  CO2 25 28 30 28 27 29   GLUCOSE 113* 109* 108* 118* 101* 101*  BUN 62* 25* 14 11 11 12   CREATININE 0.95 0.78 0.67 0.80 0.73 0.78  CALCIUM 8.1* 8.3* 8.0* 8.4* 8.3* 8.6*  MG 1.8 1.8 1.6* 1.7 1.6* 1.5*  PHOS 3.3  --   --   --   --   --     GFR: Estimated Creatinine Clearance: 66.8 mL/min (by C-G formula based on SCr of 0.78 mg/dL). Liver Function Tests: Recent Labs  Lab 03/12/21 1309 03/13/21 0723  AST 20 16  ALT 18 15  ALKPHOS 41 36*  BILITOT 1.0 0.7  PROT 5.8* 5.2*  ALBUMIN 3.2* 3.0*    No results for input(s): LIPASE, AMYLASE in the last 168 hours. No results for input(s): AMMONIA in the last 168 hours. Coagulation Profile: Recent Labs  Lab 03/12/21 1309  INR 1.2    Cardiac Enzymes: No results for input(s): CKTOTAL, CKMB, CKMBINDEX, TROPONINI in the last 168 hours. BNP (last 3 results) No results for input(s): PROBNP in the last 8760 hours. HbA1C: No results for  input(s): HGBA1C in the last 72 hours. CBG: No results for input(s): GLUCAP in the last 168 hours. Lipid Profile: No results for input(s): CHOL, HDL, LDLCALC, TRIG, CHOLHDL, LDLDIRECT in the last 72 hours. Thyroid Function Tests: No results for input(s): TSH, T4TOTAL, FREET4, T3FREE, THYROIDAB in the last 72 hours. Anemia Panel: No results for input(s): VITAMINB12, FOLATE, FERRITIN, TIBC, IRON, RETICCTPCT in the last 72 hours. Sepsis Labs: No results for input(s): PROCALCITON, LATICACIDVEN in the last 168 hours.  Recent Results (from the past 240 hour(s))  Resp Panel by RT-PCR (Flu A&B, Covid) Nasopharyngeal Swab     Status: None   Collection Time: 03/12/21  1:45 PM   Specimen: Nasopharyngeal Swab; Nasopharyngeal(NP) swabs in vial transport medium  Result Value Ref Range Status  SARS Coronavirus 2 by RT PCR NEGATIVE NEGATIVE Final    Comment: (NOTE) SARS-CoV-2 target nucleic acids are NOT DETECTED.  The SARS-CoV-2 RNA is generally detectable in upper respiratory specimens during the acute phase of infection. The lowest concentration of SARS-CoV-2 viral copies this assay can detect is 138 copies/mL. A negative result does not preclude SARS-Cov-2 infection and should not be used as the sole basis for treatment or other patient management decisions. A negative result may occur with  improper specimen collection/handling, submission of specimen other than nasopharyngeal swab, presence of viral mutation(s) within the areas targeted by this assay, and inadequate number of viral copies(<138 copies/mL). A negative result must be combined with clinical observations, patient history, and epidemiological information. The expected result is Negative.  Fact Sheet for Patients:  EntrepreneurPulse.com.au  Fact Sheet for Healthcare Providers:  IncredibleEmployment.be  This test is no t yet approved or cleared by the [place] FDA and  has been  authorized for detection and/or diagnosis of SARS-CoV-2 by FDA under an Emergency Use Authorization (EUA). This EUA will remain  in effect (meaning this test can be used) for the duration of the COVID-19 declaration under Section 564(b)(1) of the Act, 21 U.S.C.section 360bbb-3(b)(1), unless the authorization is terminated  or revoked sooner.       Influenza A by PCR NEGATIVE NEGATIVE Final   Influenza B by PCR NEGATIVE NEGATIVE Final    Comment: (NOTE) The Xpert Xpress SARS-CoV-2/FLU/RSV plus assay is intended as an aid in the diagnosis of influenza from Nasopharyngeal swab specimens and should not be used as a sole basis for treatment. Nasal washings and aspirates are unacceptable for Xpert Xpress SARS-CoV-2/FLU/RSV testing.  Fact Sheet for Patients: EntrepreneurPulse.com.au  Fact Sheet for Healthcare Providers: IncredibleEmployment.be  This test is not yet approved or cleared by the [place] FDA and has been authorized for detection and/or diagnosis of SARS-CoV-2 by FDA under an Emergency Use Authorization (EUA). This EUA will remain in effect (meaning this test can be used) for the duration of the COVID-19 declaration under Section 564(b)(1) of the Act, 21 U.S.C. section 360bbb-3(b)(1), unless the authorization is terminated or revoked.  Performed at [facility], [address], [place], [place] [address]       Radiology Studies: No results found.  Scheduled Meds:  aspirin EC  81 mg Oral Daily   Chlorhexidine Gluconate Cloth  6 each Topical Daily   digoxin  0.25 mg Intravenous Q6H   diltiazem  30 mg Oral QID   feeding supplement (GLUCERNA SHAKE)  237 mL Oral TID BM   furosemide  40 mg Intravenous Daily   levothyroxine  75 mcg Oral Q0600   metoprolol tartrate  100 mg Oral BID   pantoprazole  40 mg Oral BID   polyethylene glycol  17 g Oral BID   potassium chloride  40 mEq Oral Once   sertraline  50 mg Oral QHS    sodium chloride flush  10-40 mL Intracatheter Q12H   vitamin B-12  1,000 mcg Oral Daily   Continuous Infusions:   LOS: 6 days   Time spent: 38 minutes. More than 50% of the time was spent in counseling/coordination of care  Lorella Nimrod, MD [facility]  If 7PM-7AM, please contact night-coverage [email or web address]  03/18/2021, 4:19 PM   This record has been created using Systems analyst. Errors have been sought and corrected,but may not always be located. Such creation errors do not reflect on the standard of care.

## 2021-03-18 NOTE — Progress Notes (Signed)
Progress Note  Patient Name: Amanda Browning Date of Encounter: 03/18/2021  [facility] Cardiologist: [facility]- Garen Lah  Subjective   Sitting up in recliner, still some mild shortness of breath but much improved Does not appreciate her atrial fibrillation Daughter at the bedside Heart rate remains elevated 100 bpm at rest up to 130 with minimal exertion Still has some fullness in her abdomen,  Echocardiogram results reviewed with patient and family, moderately elevated right heart pressures, moderately dilated left atrium and right atrium Moderate TR  Inpatient Medications    Scheduled Meds:  aspirin EC  81 mg Oral Daily   Chlorhexidine Gluconate Cloth  6 each Topical Daily   digoxin  0.25 mg Intravenous Q6H   diltiazem  30 mg Oral QID   feeding supplement (GLUCERNA SHAKE)  237 mL Oral TID BM   levothyroxine  75 mcg Oral Q0600   metoprolol tartrate  100 mg Oral BID   pantoprazole  40 mg Oral BID   polyethylene glycol  17 g Oral BID   sertraline  50 mg Oral QHS   sodium chloride flush  10-40 mL Intracatheter Q12H   vitamin B-12  1,000 mcg Oral Daily   Continuous Infusions:  PRN Meds: dicyclomine, metoprolol tartrate, sodium chloride flush, sodium chloride flush   Vital Signs    Vitals:   03/17/21 2322 03/18/21 0332 03/18/21 0736 03/18/21 1133  BP: (!) 86/54 101/73 113/87 124/71  Pulse: 81 94 (!) 108 91  Resp: 18 15 18 18   Temp: 98 F (36.7 C) 98.8 F (37.1 C) 98 F (36.7 C) 98.1 F (36.7 C)  TempSrc:   Oral   SpO2: 96% 98% 95% 97%  Weight:      Height:        Intake/Output Summary (Last 24 hours) at 03/18/2021 1604 Last data filed at 03/18/2021 1511 Gross per 24 hour  Intake 1853.48 ml  Output 800 ml  Net 1053.48 ml   Last 3 Weights 03/14/2021 03/12/2021 01/12/2021  Weight (lbs) 210 lb 1.6 oz 210 lb 215 lb  Weight (kg) 95.3 kg 95.255 kg 97.523 kg      Telemetry    Atrial fibrillation with rate 100 bpm up to 130 bpm- Personally Reviewed  ECG     - Personally Reviewed  Physical Exam   GEN: No acute distress.  Obese Neck: No JVD Cardiac: Irregularly irregular no murmurs, rubs, or gallops.  Respiratory: Clear to auscultation bilaterally. GI: Soft, nontender, non-distended  MS: No edema; No deformity. Neuro:  Nonfocal  Psych: Normal affect   Labs    High Sensitivity Troponin:   Recent Labs  Lab 03/14/21 0950  TROPONINIHS 12     Chemistry Recent Labs  Lab 03/12/21 1309 03/13/21 0723 03/14/21 0950 03/16/21 0500 03/17/21 0527 03/18/21 0628  NA 135 139   < > 136 135 138  K 4.7 3.6   < > 3.5 3.5 3.8  CL 100 108   < > 104 105 104  CO2 28 25   < > 28 27 29   GLUCOSE 200* 113*   < > 118* 101* 101*  BUN 72* 62*   < > 11 11 12   CREATININE 0.93 0.95   < > 0.80 0.73 0.78  CALCIUM 8.5* 8.1*   < > 8.4* 8.3* 8.6*  MG  --  1.8   < > 1.7 1.6* 1.5*  PROT 5.8* 5.2*  --   --   --   --   ALBUMIN 3.2* 3.0*  --   --   --   --  AST 20 16  --   --   --   --   ALT 18 15  --   --   --   --   ALKPHOS 41 36*  --   --   --   --   BILITOT 1.0 0.7  --   --   --   --   GFRNONAA >60 >60   < > >60 >60 >60  ANIONGAP 7 6   < > 4* 3* 5   < > = values in this interval not displayed.    Lipids No results for input(s): CHOL, TRIG, HDL, LABVLDL, LDLCALC, CHOLHDL in the last 168 hours.  Hematology Recent Labs  Lab 03/16/21 0500 03/17/21 0527 03/18/21 0628  WBC 9.1 9.2 9.3  RBC 2.54* 2.50* 2.57*  HGB 8.5* 8.4* 8.4*  HCT 25.7* 25.4* 26.3*  MCV 101.2* 101.6* 102.3*  MCH 33.5 33.6 32.7  MCHC 33.1 33.1 31.9  RDW 15.3 15.4 16.0*  PLT 150 165 187   Thyroid  Recent Labs  Lab 03/14/21 0950 03/14/21 1404  TSH 6.280*  --   FREET4  --  0.64    BNPNo results for input(s): BNP, PROBNP in the last 168 hours.  DDimer No results for input(s): DDIMER in the last 168 hours.   Radiology    No results found.  Cardiac Studies   Echocardiogram  1. Left ventricular ejection fraction, by estimation, is 55 to 60%. The  left ventricle has  normal function. The left ventricle has no regional  wall motion abnormalities. There is mild left ventricular hypertrophy.  Left ventricular diastolic parameters  are indeterminate.   2. Right ventricular systolic function is normal. The right ventricular  size is normal. There is moderately elevated pulmonary artery systolic  pressure. The estimated right ventricular systolic pressure is 53.9 mmHg.   3. Left atrial size was moderately dilated.   4. Right atrial size was moderately dilated.   5. The mitral valve is normal in structure. Mild mitral valve  regurgitation. No evidence of mitral stenosis.   6. Tricuspid valve regurgitation is moderate.   7. The aortic valve is normal in structure. Aortic valve regurgitation is  not visualized. No aortic stenosis is present.   Patient Profile     Amanda Browning is a 75 y.o. female with a hx of hypertension, hyperlipidemia who is being seen for the evaluation of A. fib RVR   Assessment & Plan    GI bleed Initial symptoms presenting 4 days prior to admission, Hypotensive, weak in the emergency room Hemoglobin 7.2, was transfused EGD November 28, no active bleeding, started on PPI twice daily  Atrial fibrillation with RVR Initially started on diltiazem infusion, was transitioned to oral Cardizem and metoprolol -Rate continues to run high -Still with significant shortness of breath, abdominal fullness Echocardiogram with moderately elevated right heart pressures, moderate TR -We will initiate Lasix IV 40 daily -Eliquis hold given recent GI bleed -Given low blood pressure, continue current dose of metoprolol/diltiazem, will add digoxin 0.25 IV loading dose x2  Acute diastolic CHF In the setting of atrial fibrillation with RVR, GI bleed -Moderately elevated right heart pressures on echocardiogram -We will start IV Lasix 40 daily with potassium 20 daily -At discharge will likely need Lasix 20 daily with potassium  Long discussion  with patient and daughter at the bedside concerning atrial fibrillation, mechanism, management, Limitations on anticoagulation, plans to restore normal sinus rhythm as an outpatient  Total encounter time more than  35 minutes  Greater than 50% was spent in counseling and coordination of care with the patient   For questions or updates, please contact [place] Please consult [email or web address] for contact info under        Signed, Ida Rogue, MD  03/18/2021, 4:04 PM

## 2021-03-18 NOTE — Progress Notes (Signed)
Mobility Specialist - Progress Note   03/18/21 1500  Mobility  Activity Ambulated in hall  Level of Assistance Modified independent, requires aide device or extra time  Assistive Device None  Distance Ambulated (ft) 160 ft  Mobility Ambulated independently in hallway  Mobility Response Tolerated well  Mobility performed by Mobility specialist  $Mobility charge 1 Mobility    Pt ambulated in hallway utilizing RA. HR ranging 114-140 bpm with RR peaking at 43. No s/s of distress. Vitals improved at rest. Pt left in chair, family at bedside.    Kathee Delton Mobility Specialist 03/18/21, 4:00 PM

## 2021-03-19 DIAGNOSIS — I10 Essential (primary) hypertension: Secondary | ICD-10-CM | POA: Diagnosis not present

## 2021-03-19 DIAGNOSIS — I4891 Unspecified atrial fibrillation: Secondary | ICD-10-CM | POA: Diagnosis not present

## 2021-03-19 DIAGNOSIS — R079 Chest pain, unspecified: Secondary | ICD-10-CM | POA: Diagnosis not present

## 2021-03-19 DIAGNOSIS — I5033 Acute on chronic diastolic (congestive) heart failure: Secondary | ICD-10-CM

## 2021-03-19 DIAGNOSIS — K922 Gastrointestinal hemorrhage, unspecified: Secondary | ICD-10-CM | POA: Diagnosis not present

## 2021-03-19 LAB — CBC WITH DIFFERENTIAL/PLATELET
Abs Immature Granulocytes: 0.05 10*3/uL (ref 0.00–0.07)
Basophils Absolute: 0.1 10*3/uL (ref 0.0–0.1)
Basophils Relative: 1 %
Eosinophils Absolute: 0.4 10*3/uL (ref 0.0–0.5)
Eosinophils Relative: 4 %
HCT: 28.5 % — ABNORMAL LOW (ref 36.0–46.0)
Hemoglobin: 9 g/dL — ABNORMAL LOW (ref 12.0–15.0)
Immature Granulocytes: 0 %
Lymphocytes Relative: 19 %
Lymphs Abs: 2.3 10*3/uL (ref 0.7–4.0)
MCH: 32.6 pg (ref 26.0–34.0)
MCHC: 31.6 g/dL (ref 30.0–36.0)
MCV: 103.3 fL — ABNORMAL HIGH (ref 80.0–100.0)
Monocytes Absolute: 1.1 10*3/uL — ABNORMAL HIGH (ref 0.1–1.0)
Monocytes Relative: 10 %
Neutro Abs: 7.8 10*3/uL — ABNORMAL HIGH (ref 1.7–7.7)
Neutrophils Relative %: 66 %
Platelets: 227 10*3/uL (ref 150–400)
RBC: 2.76 MIL/uL — ABNORMAL LOW (ref 3.87–5.11)
RDW: 16.3 % — ABNORMAL HIGH (ref 11.5–15.5)
WBC: 11.7 10*3/uL — ABNORMAL HIGH (ref 4.0–10.5)
nRBC: 0 % (ref 0.0–0.2)

## 2021-03-19 LAB — MAGNESIUM: Magnesium: 2 mg/dL (ref 1.7–2.4)

## 2021-03-19 LAB — BASIC METABOLIC PANEL
Anion gap: 7 (ref 5–15)
BUN: 21 mg/dL (ref 8–23)
CO2: 31 mmol/L (ref 22–32)
Calcium: 9.1 mg/dL (ref 8.9–10.3)
Chloride: 98 mmol/L (ref 98–111)
Creatinine, Ser: 0.82 mg/dL (ref 0.44–1.00)
GFR, Estimated: >60 mL/min (ref >60)
Glucose, Bld: 103 mg/dL — ABNORMAL HIGH (ref 70–99)
Potassium: 4.2 mmol/L (ref 3.5–5.1)
Sodium: 136 mmol/L (ref 135–145)

## 2021-03-19 MED ORDER — DIGOXIN 125 MCG PO TABS
0.1250 mg | ORAL_TABLET | Freq: Every day | ORAL | Status: DC
  Administered 2021-03-19 – 2021-03-20 (×2): 0.125 mg via ORAL
  Filled 2021-03-19 (×2): qty 1

## 2021-03-19 MED ORDER — DILTIAZEM HCL ER COATED BEADS 180 MG PO CP24
180.0000 mg | ORAL_CAPSULE | Freq: Every day | ORAL | Status: DC
  Administered 2021-03-19 – 2021-03-20 (×2): 180 mg via ORAL
  Filled 2021-03-19 (×2): qty 1

## 2021-03-19 NOTE — Progress Notes (Addendum)
Pt ambulated around nursing station - c/o SOB -max HR 128

## 2021-03-19 NOTE — Progress Notes (Signed)
Progress Note  Patient Name: Amanda Browning Date of Encounter: 03/19/2021  [facility] Cardiologist: [facility]- AGBOR-ETANG  Subjective   Sitting up in recliner,  Continued shortness of breath on exertion Heart rate continues to run high, 100 at rest Abdomen bloated  Inpatient Medications    Scheduled Meds:  aspirin EC  81 mg Oral Daily   Chlorhexidine Gluconate Cloth  6 each Topical Daily   digoxin  0.125 mg Oral Daily   diltiazem  180 mg Oral Daily   feeding supplement (GLUCERNA SHAKE)  237 mL Oral TID BM   furosemide  40 mg Intravenous Daily   levothyroxine  75 mcg Oral Q0600   metoprolol tartrate  100 mg Oral BID   pantoprazole  40 mg Oral BID   polyethylene glycol  17 g Oral BID   sertraline  50 mg Oral QHS   sodium chloride flush  10-40 mL Intracatheter Q12H   vitamin B-12  1,000 mcg Oral Daily   Continuous Infusions:  PRN Meds: dicyclomine, metoprolol tartrate, sodium chloride flush, sodium chloride flush   Vital Signs    Vitals:   03/19/21 0023 03/19/21 0455 03/19/21 0741 03/19/21 1133  BP: 138/69 134/60 116/63 125/73  Pulse: 83 62 96 75  Resp: 20 20 18 18   Temp: (!) 97.4 F (36.3 C) 98.2 F (36.8 C) 98 F (36.7 C) 98 F (36.7 C)  TempSrc:      SpO2: 98% 97% 95% 95%  Weight:      Height:        Intake/Output Summary (Last 24 hours) at 03/19/2021 1200 Last data filed at 03/19/2021 1131 Gross per 24 hour  Intake 1613.48 ml  Output 5175 ml  Net -3561.52 ml   Last 3 Weights 03/14/2021 03/12/2021 01/12/2021  Weight (lbs) 210 lb 1.6 oz 210 lb 215 lb  Weight (kg) 95.3 kg 95.255 kg 97.523 kg      Telemetry    Atrial fibrillation with rate 100 bpm- Personally Reviewed  ECG    - Personally Reviewed  Physical Exam   Constitutional:  oriented to person, place, and time. No distress.  HENT:  Head: Grossly normal Eyes:  no discharge. No scleral icterus.  Neck: Unable to estimate JVD, no carotid bruits  Cardiovascular: Irregularly irregular,  no murmurs appreciated Pulmonary/Chest: Clear to auscultation bilaterally, no wheezes or rails Abdominal: Soft.  no distension.  no tenderness.  Musculoskeletal: Normal range of motion Neurological:  normal muscle tone. Coordination normal. No atrophy Skin: Skin warm and dry Psychiatric: normal affect, pleasant  Labs    High Sensitivity Troponin:   Recent Labs  Lab 03/14/21 0950  TROPONINIHS 12     Chemistry Recent Labs  Lab 03/12/21 1309 03/13/21 0723 03/14/21 0950 03/17/21 0527 03/18/21 0628 03/19/21 0237  NA 135 139   < > 135 138 136  K 4.7 3.6   < > 3.5 3.8 4.2  CL 100 108   < > 105 104 98  CO2 28 25   < > 27 29 31   GLUCOSE 200* 113*   < > 101* 101* 103*  BUN 72* 62*   < > 11 12 21   CREATININE 0.93 0.95   < > 0.73 0.78 0.82  CALCIUM 8.5* 8.1*   < > 8.3* 8.6* 9.1  MG  --  1.8   < > 1.6* 1.5* 2.0  PROT 5.8* 5.2*  --   --   --   --   ALBUMIN 3.2* 3.0*  --   --   --   --  AST 20 16  --   --   --   --   ALT 18 15  --   --   --   --   ALKPHOS 41 36*  --   --   --   --   BILITOT 1.0 0.7  --   --   --   --   GFRNONAA >60 >60   < > >60 >60 >60  ANIONGAP 7 6   < > 3* 5 7   < > = values in this interval not displayed.    Lipids No results for input(s): CHOL, TRIG, HDL, LABVLDL, LDLCALC, CHOLHDL in the last 168 hours.  Hematology Recent Labs  Lab 03/17/21 0527 03/18/21 0628 03/19/21 0237  WBC 9.2 9.3 11.7*  RBC 2.50* 2.57* 2.76*  HGB 8.4* 8.4* 9.0*  HCT 25.4* 26.3* 28.5*  MCV 101.6* 102.3* 103.3*  MCH 33.6 32.7 32.6  MCHC 33.1 31.9 31.6  RDW 15.4 16.0* 16.3*  PLT 165 187 227   Thyroid  Recent Labs  Lab 03/14/21 0950 03/14/21 1404  TSH 6.280*  --   FREET4  --  0.64    BNPNo results for input(s): BNP, PROBNP in the last 168 hours.  DDimer No results for input(s): DDIMER in the last 168 hours.   Radiology    No results found.  Cardiac Studies   Echocardiogram  1. Left ventricular ejection fraction, by estimation, is 55 to 60%. The  left ventricle  has normal function. The left ventricle has no regional  wall motion abnormalities. There is mild left ventricular hypertrophy.  Left ventricular diastolic parameters  are indeterminate.   2. Right ventricular systolic function is normal. The right ventricular  size is normal. There is moderately elevated pulmonary artery systolic  pressure. The estimated right ventricular systolic pressure is 35.4 mmHg.   3. Left atrial size was moderately dilated.   4. Right atrial size was moderately dilated.   5. The mitral valve is normal in structure. Mild mitral valve  regurgitation. No evidence of mitral stenosis.   6. Tricuspid valve regurgitation is moderate.   7. The aortic valve is normal in structure. Aortic valve regurgitation is  not visualized. No aortic stenosis is present.   Patient Profile     CARMELITA AMPARO is a 75 y.o. female with a hx of hypertension, hyperlipidemia who is being seen for the evaluation of A. fib RVR   Assessment & Plan    GI bleed Noted GI bleeding 4 days prior to admission Presenting with symptoms of hypotensive, weakness Hemoglobin 7.2 on arrival, was transfused EGD November 28, ulcers noted, no active bleeding, started on PPI twice daily -Eliquis on hold for now, plan on 2-week hold before starting (earlier if permitted by GI)  Atrial fibrillation with RVR Initially started on diltiazem infusion, was transitioned to oral Cardizem and metoprolol -Challenging rate control Yesterday started on digoxin load, will continue 0.125 daily -Increase diltiazem up to diltiazem extended release 180 Continue metoprolol  Acute diastolic CHF In the setting of atrial fibrillation with RVR, GI bleed -Moderately elevated right heart pressures on echocardiogram Started on IV Lasix 40 daily with potassium yesterday, good urine output, still with abdominal bloating, shortness of breath on exertion -Plan to continue Lasix today with close monitoring of renal  function  Discussed plan with hospitalist service, daughter at the bedside, patient   Total encounter time more than 25 minutes  Greater than 50% was spent in counseling and coordination of care  with the patient    For questions or updates, please contact [place] Please consult [email or web address] for contact info under        Signed, Ida Rogue, MD  03/19/2021, 12:00 PM

## 2021-03-19 NOTE — Progress Notes (Signed)
PROGRESS NOTE    Amanda Browning  WGY:[id number] DOB: 1945/09/11 DOA: 03/12/2021 PCP: Amanda Pheasant, MD   Brief Narrative: Taken from prior notes. Amanda Browning is a 75 yo female with PMH GERD, HLD, hypothyroidism, anemia who presented to the ER with dark stools and dark-colored vomiting that started the morning of admission. On work-up in the ER she was found to be hypotensive, tachycardic, hypothermic. Initial hemoglobin noted to be 9 g/dL.  WBC 13.1, platelet count 218.  Later decreased to 7.2 and she received 1 unit of PRBC. GI was consulted and patient underwent EGD on 03/15/2021 with no sign of active bleeding.  They are recommending twice daily PPI. Patient also developed new onset atrial fibrillation with RVR, requiring Cardizem infusion.  Cardiology was consulted and Cardizem infusion was transition to p.o. Cardizem with Lopressor.  12/1: Due to persistent RVR with minor exertion, cardiology added digoxin. 12/2: Cardiology increase the dose of Cardizem today.  Subjective: Per patient she was feeling much improved and able to do some ADLs without being very short of breath or tachycardic.  Heart rate remained in low 100s.  Assessment & Plan:   Principal Problem:   GI bleed Active Problems:   Hypertension   Macrocytic anemia   Hypothyroidism   Prediabetes   Hiatal hernia   SIRS (systemic inflammatory response syndrome) (HCC)   Iron deficiency anemia   Atrial fibrillation with RVR ([place])   Cameron ulcer, chronic   Hiatal hernia with GERD and esophagitis   Acute on chronic diastolic CHF (congestive heart failure) (HCC)  GI bleed.  Received 1 unit of PRBC on 03/13/2021.  Identified EGD on 03/15/2021 with GI, find to have a large hiatal hernia with multiple clean-based Cameron ulcers.  No active bleeding.  Grade D esophagitis was diagnosed.  They are recommending twice daily PPI. Hemoglobin currently stable. -Continue to monitor  New onset A. fib with RVR.  Initially  requiring Cardizem infusion.  Rate improved and Cardizem infusion was switched with p.o. Cardizem by cardiology. CHA2DS2-VASc = 4 pts Echocardiogram with normal EF, mild LVH and indeterminate diastolic parameters. Holding anticoagulation due to recent GI bleed-can be given a challenge as an outpatient. -Cardiology added digoxin due to persistent RVR with minor exertion. -Cardiology increase the dose of Cardizem to 180 mg daily. -Per GI anticoagulation can be added in 2 weeks.  Macrocytic anemia.  Iron studies within normal limit at this time, B12 at 346, folate 37.  S/p Venofer on 11/28.  Hypothyroidism.  Elevated TSH with low T3.  Synthroid dose was increased to 75 MCG daily. -Patient will need a repeat TSH in 4 to 6 weeks  Hypertension.  Blood pressure within goal. -Continue Cardizem and Lopressor -Might have to discontinue home meds on discharge.  Prediabetes.  A1c of 5.9 on 01/12/2021 -Continue with diet control  Objective: Vitals:   03/19/21 0455 03/19/21 0741 03/19/21 1133 03/19/21 1420  BP: 134/60 116/63 125/73 124/72  Pulse: 62 96 75 92  Resp: 20 18 18 18   Temp: 98.2 F (36.8 C) 98 F (36.7 C) 98 F (36.7 C) 97.8 F (36.6 C)  TempSrc:  Oral Oral Oral  SpO2: 97% 95% 95% 97%  Weight:      Height:        Intake/Output Summary (Last 24 hours) at 03/19/2021 1510 Last data filed at 03/19/2021 1416 Gross per 24 hour  Intake 1613.48 ml  Output 5125 ml  Net -3511.52 ml    Filed Weights   03/12/21 1303 03/14/21  1139  Weight: 95.3 kg 95.3 kg    Examination:  General.  Pleasant elderly lady, in no acute distress. Pulmonary.  Lungs clear bilaterally, normal respiratory effort. CV.  Irregularly irregular Abdomen.  Soft, nontender, nondistended, BS positive. CNS.  Alert and oriented .  No focal neurologic deficit. Extremities.  No edema, no cyanosis, pulses intact and symmetrical. Psychiatry.  Judgment and insight appears normal.     DVT prophylaxis: SCDs Code  Status: Full Family Communication: Discussed with daughter on phone. Disposition Plan:  Status is: Inpatient  Remains inpatient appropriate because: Of the severity of illness  Level of care: Progressive  All the records are reviewed and case discussed with Care Management/Social Worker. Management plans discussed with the patient, nursing and they are in agreement.  Consultants:  Cardiology  Procedures:  Antimicrobials:   Data Reviewed: I have personally reviewed following labs and imaging studies  CBC: Recent Labs  Lab 03/15/21 0500 03/16/21 0500 03/17/21 0527 03/18/21 0628 03/19/21 0237  WBC 7.1 9.1 9.2 9.3 11.7*  NEUTROABS 4.2 6.0 5.5 5.6 7.8*  HGB 8.2* 8.5* 8.4* 8.4* 9.0*  HCT 24.6* 25.7* 25.4* 26.3* 28.5*  MCV 100.4* 101.2* 101.6* 102.3* 103.3*  PLT 143* 150 165 187 998    Basic Metabolic Panel: Recent Labs  Lab 03/13/21 0723 03/14/21 0950 03/15/21 0500 03/16/21 0500 03/17/21 0527 03/18/21 0628 03/19/21 0237  NA 139   < > 137 136 135 138 136  K 3.6   < > 3.1* 3.5 3.5 3.8 4.2  CL 108   < > 104 104 105 104 98  CO2 25   < > 30 28 27 29 31   GLUCOSE 113*   < > 108* 118* 101* 101* 103*  BUN 62*   < > 14 11 11 12 21   CREATININE 0.95   < > 0.67 0.80 0.73 0.78 0.82  CALCIUM 8.1*   < > 8.0* 8.4* 8.3* 8.6* 9.1  MG 1.8   < > 1.6* 1.7 1.6* 1.5* 2.0  PHOS 3.3  --   --   --   --   --   --    < > = values in this interval not displayed.    GFR: Estimated Creatinine Clearance: 65.1 mL/min (by C-G formula based on SCr of 0.82 mg/dL). Liver Function Tests: Recent Labs  Lab 03/13/21 0723  AST 16  ALT 15  ALKPHOS 36*  BILITOT 0.7  PROT 5.2*  ALBUMIN 3.0*    No results for input(s): LIPASE, AMYLASE in the last 168 hours. No results for input(s): AMMONIA in the last 168 hours. Coagulation Profile: No results for input(s): INR, PROTIME in the last 168 hours.  Cardiac Enzymes: No results for input(s): CKTOTAL, CKMB, CKMBINDEX, TROPONINI in the last 168  hours. BNP (last 3 results) No results for input(s): PROBNP in the last 8760 hours. HbA1C: No results for input(s): HGBA1C in the last 72 hours. CBG: No results for input(s): GLUCAP in the last 168 hours. Lipid Profile: No results for input(s): CHOL, HDL, LDLCALC, TRIG, CHOLHDL, LDLDIRECT in the last 72 hours. Thyroid Function Tests: No results for input(s): TSH, T4TOTAL, FREET4, T3FREE, THYROIDAB in the last 72 hours. Anemia Panel: No results for input(s): VITAMINB12, FOLATE, FERRITIN, TIBC, IRON, RETICCTPCT in the last 72 hours. Sepsis Labs: No results for input(s): PROCALCITON, LATICACIDVEN in the last 168 hours.  Recent Results (from the past 240 hour(s))  Resp Panel by RT-PCR (Flu A&B, Covid) Nasopharyngeal Swab     Status: None  Collection Time: 03/12/21  1:45 PM   Specimen: Nasopharyngeal Swab; Nasopharyngeal(NP) swabs in vial transport medium  Result Value Ref Range Status   SARS Coronavirus 2 by RT PCR NEGATIVE NEGATIVE Final    Comment: (NOTE) SARS-CoV-2 target nucleic acids are NOT DETECTED.  The SARS-CoV-2 RNA is generally detectable in upper respiratory specimens during the acute phase of infection. The lowest concentration of SARS-CoV-2 viral copies this assay can detect is 138 copies/mL. A negative result does not preclude SARS-Cov-2 infection and should not be used as the sole basis for treatment or other patient management decisions. A negative result may occur with  improper specimen collection/handling, submission of specimen other than nasopharyngeal swab, presence of viral mutation(s) within the areas targeted by this assay, and inadequate number of viral copies(<138 copies/mL). A negative result must be combined with clinical observations, patient history, and epidemiological information. The expected result is Negative.  Fact Sheet for Patients:  EntrepreneurPulse.com.au  Fact Sheet for Healthcare Providers:   IncredibleEmployment.be  This test is no t yet approved or cleared by the [place] FDA and  has been authorized for detection and/or diagnosis of SARS-CoV-2 by FDA under an Emergency Use Authorization (EUA). This EUA will remain  in effect (meaning this test can be used) for the duration of the COVID-19 declaration under Section 564(b)(1) of the Act, 21 U.S.C.section 360bbb-3(b)(1), unless the authorization is terminated  or revoked sooner.       Influenza A by PCR NEGATIVE NEGATIVE Final   Influenza B by PCR NEGATIVE NEGATIVE Final    Comment: (NOTE) The Xpert Xpress SARS-CoV-2/FLU/RSV plus assay is intended as an aid in the diagnosis of influenza from Nasopharyngeal swab specimens and should not be used as a sole basis for treatment. Nasal washings and aspirates are unacceptable for Xpert Xpress SARS-CoV-2/FLU/RSV testing.  Fact Sheet for Patients: EntrepreneurPulse.com.au  Fact Sheet for Healthcare Providers: IncredibleEmployment.be  This test is not yet approved or cleared by the [place] FDA and has been authorized for detection and/or diagnosis of SARS-CoV-2 by FDA under an Emergency Use Authorization (EUA). This EUA will remain in effect (meaning this test can be used) for the duration of the COVID-19 declaration under Section 564(b)(1) of the Act, 21 U.S.C. section 360bbb-3(b)(1), unless the authorization is terminated or revoked.  Performed at [facility] [address], [place], [place] [address]       Radiology Studies: No results found.  Scheduled Meds:  aspirin EC  81 mg Oral Daily   Chlorhexidine Gluconate Cloth  6 each Topical Daily   digoxin  0.125 mg Oral Daily   diltiazem  180 mg Oral Daily   feeding supplement (GLUCERNA SHAKE)  237 mL Oral TID BM   furosemide  40 mg Intravenous Daily   levothyroxine  75 mcg Oral Q0600   metoprolol tartrate  100 mg Oral BID    pantoprazole  40 mg Oral BID   polyethylene glycol  17 g Oral BID   sertraline  50 mg Oral QHS   sodium chloride flush  10-40 mL Intracatheter Q12H   vitamin B-12  1,000 mcg Oral Daily   Continuous Infusions:   LOS: 7 days   Time spent: 38 minutes. More than 50% of the time was spent in counseling/coordination of care  Lorella Nimrod, MD [facility]  If 7PM-7AM, please contact night-coverage [email or web address]  03/19/2021, 3:10 PM   This record has been created using Systems analyst. Errors have been sought and corrected,but may not  always be located. Such creation errors do not reflect on the standard of care.

## 2021-03-19 NOTE — Care Management Important Message (Signed)
Important Message  Patient Details  Name: Amanda Browning MRN: [id number] Date of Birth: Jan 10, 1946   Medicare Important Message Given:  Yes     [name] 03/19/2021, 2:55 PM

## 2021-03-20 DIAGNOSIS — I5033 Acute on chronic diastolic (congestive) heart failure: Secondary | ICD-10-CM

## 2021-03-20 MED ORDER — METOPROLOL TARTRATE 100 MG PO TABS: 100.0000 mg | ORAL_TABLET | Freq: Two times a day (BID) | ORAL | 1 refills | Status: DC

## 2021-03-20 MED ORDER — DILTIAZEM HCL ER COATED BEADS 180 MG PO CP24
180.0000 mg | ORAL_CAPSULE | Freq: Every day | ORAL | 1 refills | Status: DC
Start: 2021-03-20 — End: 2021-03-26

## 2021-03-20 MED ORDER — LEVOTHYROXINE SODIUM 75 MCG PO TABS: 75.0000 ug | ORAL_TABLET | Freq: Every day | ORAL | 0 refills | Status: DC

## 2021-03-20 MED ORDER — TORSEMIDE 20 MG PO TABS: 20.0000 mg | ORAL_TABLET | Freq: Every day | ORAL | 1 refills | Status: DC

## 2021-03-20 MED ORDER — DIGOXIN 125 MCG PO TABS
0.1250 mg | ORAL_TABLET | Freq: Once | ORAL | Status: DC
  Filled 2021-03-20: qty 1

## 2021-03-20 MED ORDER — FUROSEMIDE 40 MG PO TABS: 40.0000 mg | ORAL_TABLET | Freq: Every day | ORAL | Status: DC

## 2021-03-20 MED ORDER — DIGOXIN 250 MCG PO TABS: 0.2500 mg | ORAL_TABLET | Freq: Every day | ORAL | 1 refills | Status: DC

## 2021-03-20 MED ORDER — DIGOXIN 250 MCG PO TABS: 0.2500 mg | ORAL_TABLET | Freq: Every day | ORAL | Status: DC

## 2021-03-20 NOTE — Progress Notes (Signed)
Patient ready for discharge. All instructions reviewed with patient and daughter at bedside. All questions answered at this time. Patient to be transported home by daughter in [place]. Daughter assisting patient with dressing and packing belongings. All belongings brought to hospital by patient have been returned to patient and are in the room to be packed to take home.

## 2021-03-20 NOTE — Discharge Summary (Signed)
Physician Discharge Summary  [name] ZWC:[id number] DOB: 08/31/1945 DOA: 03/12/2021  PCP: Einar Pheasant, MD  Admit date: 03/12/2021 Discharge date: 03/20/2021  Admitted From: Home Disposition: Home  Recommendations for Outpatient Follow-up:  Follow up with PCP in 1-2 weeks Follow-up with cardiology within 1 week Follow-up with gastroenterology in 2 to 4 weeks Please obtain BMP/CBC in one week Please obtain TSH in 4 to 6 weeks as dose was increased. Please follow up on the following pending results: None  Home Health: No Equipment/Devices: Rolling walker Discharge Condition: Stable CODE STATUS: Full Diet recommendation: Heart Healthy / Carb Modified   Brief/Interim Summary: [name] is a 75 yo female with PMH GERD, HLD, hypothyroidism, anemia who presented to the ER with dark stools and dark-colored vomiting that started the morning of admission. On work-up in the ER she was found to be hypotensive, tachycardic, hypothermic. Initial hemoglobin noted to be 9 g/dL.  WBC 13.1, platelet count 218.  Later decreased to 7.2 and she received 1 unit of PRBC. GI was consulted and patient underwent EGD on 03/15/2021 , found to have hiatal hernia and Cameron ulcer with no sign of active bleeding.  They are recommending twice daily PPI. Patient also developed new onset atrial fibrillation with RVR, requiring Cardizem infusion.  Cardiology was consulted and Cardizem infusion was transition to p.o. Cardizem with Lopressor. Later digoxin was added due to persistently elevated heart rate.  Cardizem dose was increased to 180 mg daily with resultant improvement in heart rate. CHA2DS2-VASc = 4 pts. echocardiogram with normal EF, mild LVH and indeterminate diastolic parameters.  Patient needs anticoagulation which is being held at the advice of GI.  GI is recommending starting anticoagulation 2 weeks after the EGD.  Patient will follow-up closely with cardiology who can start anticoagulation if  needed and a possible cardioversion if remained in A. fib.  Patient was also found to have elevated TSH with low T3.  Home Synthroid dose was increased to 75 MCG.  Patient needs a repeat TSH in 4 to 6 weeks and titration of dose as needed which can be done at PCP office.  Patient home dose of amlodipine and combination pill was discontinued as blood pressure is now well controlled on Cardizem and Lopressor.  Patient will continue rest of her home medications except mentioned above and follow-up with her providers.  Discharge Diagnoses:  Principal Problem:   GI bleed Active Problems:   Hypertension   Macrocytic anemia   Hypothyroidism   Prediabetes   Hiatal hernia   SIRS (systemic inflammatory response syndrome) (HCC)   Iron deficiency anemia   Atrial fibrillation with RVR ([place])   Cameron ulcer, chronic   Hiatal hernia with GERD and esophagitis   Acute on chronic diastolic CHF (congestive heart failure) (HCC)   Discharge Instructions  Discharge Instructions     Diet - low sodium heart healthy   Complete by: As directed    Discharge instructions   Complete by: As directed    It was pleasure taking care of you. We made some changes to your blood pressure medications, we stopped amlodipine and a combination pill of lisinopril and HCTZ as we are starting you on 2 new medications Cardizem and metoprolol which will help with your blood pressure and heart rate. Your cardiologist also started you on digoxin for better control of your heart rate. You are also being started on torsemide for extra fluid. Please follow-up closely with your cardiologist for further recommendations, and need to start the  blood thinners appropriately as we discussed. Continue taking your supplements and follow-up with your gastroenterologist.   Increase activity slowly   Complete by: As directed       Allergies as of 03/20/2021       Reactions   Codeine Other (See Comments)   Can not function Other  reaction(s): Other (See Comments) Altered mental status- "Cannot function" Can not function Can not function        Medication List     STOP taking these medications    amLODipine 5 MG tablet Commonly known as: NORVASC   glucosamine-chondroitin 500-400 MG tablet   lisinopril-hydrochlorothiazide 20-25 MG tablet Commonly known as: ZESTORETIC       TAKE these medications    acetaminophen 325 MG tablet Commonly known as: TYLENOL Take 650 mg by mouth 4 (four) times daily as needed.   albuterol 108 (90 Base) MCG/ACT inhaler Commonly known as: VENTOLIN HFA Inhale 2 puffs into the lungs every 6 (six) hours as needed for wheezing or shortness of breath.   aspirin EC 81 MG tablet Take 81 mg by mouth daily.   cetirizine 10 MG tablet Commonly known as: ZYRTEC Take 10 mg by mouth daily.   digoxin 0.25 MG tablet Commonly known as: LANOXIN Take 1 tablet (0.25 mg total) by mouth daily. Start taking on: March 21, 2021   diltiazem 180 MG 24 hr capsule Commonly known as: CARDIZEM CD Take 1 capsule (180 mg total) by mouth daily.   ferrous sulfate 325 (65 FE) MG tablet Take 325 mg by mouth daily with breakfast.   levothyroxine 75 MCG tablet Commonly known as: SYNTHROID Take 1 tablet (75 mcg total) by mouth daily at 6 (six) AM. What changed:  medication strength See the new instructions.   lovastatin 40 MG tablet Commonly known as: MEVACOR TAKE 1 TABLET AT BEDTIME   magnesium oxide 400 MG tablet Commonly known as: MAG-OX Take 1 tablet (400 mg total) by mouth 2 (two) times daily.   metoprolol tartrate 100 MG tablet Commonly known as: LOPRESSOR Take 1 tablet (100 mg total) by mouth 2 (two) times daily.   multivitamin tablet Take 1 tablet by mouth daily.   omeprazole 20 MG capsule Commonly known as: PRILOSEC TAKE 1 CAPSULE TWICE DAILY BEFORE MEALS   sertraline 50 MG tablet Commonly known as: ZOLOFT TAKE 1/2 TABLET DAILY FOR 1 WEEK, THEN TAKE 1 TABLET  DAILY What changed: See the new instructions.   torsemide 20 MG tablet Commonly known as: DEMADEX Take 1 tablet (20 mg total) by mouth daily.   vitamin B-12 1000 MCG tablet Commonly known as: CYANOCOBALAMIN Take 1 tablet by mouth daily.        Follow-up Information     Kate Sable, MD Follow up.   Specialties: Cardiology, Radiology Why: our office will contact you for follow-up. Contact information: [place] [address] [phone]         Einar Pheasant, MD. Schedule an appointment as soon as possible for a visit in 1 week(s).   Specialty: Internal Medicine Contact information: [address] [place] [address] [phone]         Kate Sable, MD .   Specialties: Cardiology, Radiology Contact information: [address] [place] [address] [phone]                Allergies  Allergen Reactions   Codeine Other (See Comments)    Can not function Other reaction(s): Other (See Comments) Altered  mental status- "Cannot function" Can not function Can not function    Consultations: GI Cardiology  Procedures/Studies: DG Chest Port 1 View  Result Date: 03/14/2021 CLINICAL DATA:  PICC line placement EXAM: PORTABLE CHEST 1 VIEW COMPARISON:  Portable exam 1303 hours compared to 03/13/2021 FINDINGS: RIGHT arm PICC line with tip projecting over SVC. Enlargement of cardiac silhouette. Atherosclerotic calcification aorta. Lungs clear. No pulmonary infiltrate, pleural effusion, or pneumothorax. Osseous structures unremarkable. IMPRESSION: Tip of RIGHT arm PICC line projects over SVC. Enlargement of cardiac silhouette. Aortic Atherosclerosis (ICD10-I70.0). Electronically Signed   By: Lavonia Dana M.D.   On: 03/14/2021 13:44   DG CHEST PORT 1 VIEW  Result Date: 03/13/2021 CLINICAL DATA:  PICC line placement EXAM: PORTABLE CHEST 1 VIEW COMPARISON:  Chest x-ray 03/12/2021. FINDINGS: Right  PICC line with tip overlying the expected region of distal superior vena cava. Prominent cardiac silhouette which may be due to portable AP technique. Otherwise the heart and mediastinal contours are within normal limits. Large hiatal hernia noted. No focal consolidation. No pulmonary edema. No pleural effusion. No pneumothorax. No acute osseous abnormality. IMPRESSION: 1. Right PICC line with tip overlying the expected region of distal superior vena cava. 2. Large hiatal hernia. Electronically Signed   By: Iven Finn M.D.   On: 03/13/2021 19:20   DG Chest Portable 1 View  Result Date: 03/12/2021 CLINICAL DATA:  75 year old female with chest pain, hematemesis, and melena. EXAM: PORTABLE CHEST - 1 VIEW COMPARISON:  None. FINDINGS: The mediastinal contours are within normal limits. Mild cardiomegaly. Air-fluid level projects over the superior cardiac silhouette the lungs are clear bilaterally without evidence of focal consolidation, pleural effusion, or pneumothorax. No acute osseous abnormality. IMPRESSION: Suggestion of large hiatal hernia. Consider two-view chest radiographs versus CT chest, abdomen, and pelvis for further characterization as clinically indicated. Electronically Signed   By: Ruthann Cancer M.D.   On: 03/12/2021 13:47   ECHOCARDIOGRAM COMPLETE  Result Date: 03/15/2021    ECHOCARDIOGRAM REPORT   Patient Name:   MALAYSHA ARLEN Date of Exam: 03/15/2021 Medical Rec #:  [id number]         Height:       63.0 in Accession #:    [id number]        Weight:       210.1 lb Date of Birth:  Mar 14, 1946         BSA:          1.975 m Patient Age:    75 years          BP:           100/57 mmHg Patient Gender: F                 HR:           76 bpm. Exam Location:  [facility] Procedure: 2D Echo, Color Doppler and Cardiac Doppler Indications:     Atrial Fibrillation I48.91  History:         Patient has no prior history of Echocardiogram examinations.                  Risk Factors:Hypertension and  Dyslipidemia.  Sonographer:     Sherrie Sport Referring Phys:  [place] Diagnosing Phys: Kathlyn Sacramento MD IMPRESSIONS  1. Left ventricular ejection fraction, by estimation, is 55 to 60%. The left ventricle has normal function. The left ventricle has no regional wall motion abnormalities. There is mild left ventricular hypertrophy. Left ventricular diastolic parameters  are indeterminate.  2. Right ventricular systolic function is normal. The right ventricular size is normal. There is moderately elevated pulmonary artery systolic pressure. The estimated right ventricular systolic pressure is 76.8 mmHg.  3. Left atrial size was moderately dilated.  4. Right atrial size was moderately dilated.  5. The mitral valve is normal in structure. Mild mitral valve regurgitation. No evidence of mitral stenosis.  6. Tricuspid valve regurgitation is moderate.  7. The aortic valve is normal in structure. Aortic valve regurgitation is not visualized. No aortic stenosis is present. FINDINGS  Left Ventricle: Left ventricular ejection fraction, by estimation, is 55 to 60%. The left ventricle has normal function. The left ventricle has no regional wall motion abnormalities. The left ventricular internal cavity size was normal in size. There is  mild left ventricular hypertrophy. Left ventricular diastolic parameters are indeterminate. Right Ventricle: The right ventricular size is normal. No increase in right ventricular wall thickness. Right ventricular systolic function is normal. There is moderately elevated pulmonary artery systolic pressure. The tricuspid regurgitant velocity is 3.22 m/s, and with an assumed right atrial pressure of 5 mmHg, the estimated right ventricular systolic pressure is 08.8 mmHg. Left Atrium: Left atrial size was moderately dilated. Right Atrium: Right atrial size was moderately dilated. Pericardium: There is no evidence of pericardial effusion. Mitral Valve: The mitral valve is normal in structure.  Mild mitral valve regurgitation. No evidence of mitral valve stenosis. Tricuspid Valve: The tricuspid valve is normal in structure. Tricuspid valve regurgitation is moderate . No evidence of tricuspid stenosis. Aortic Valve: The aortic valve is normal in structure. Aortic valve regurgitation is not visualized. No aortic stenosis is present. Pulmonic Valve: The pulmonic valve was normal in structure. Pulmonic valve regurgitation is not visualized. No evidence of pulmonic stenosis. Aorta: The aortic root is normal in size and structure. Venous: The inferior vena cava was not well visualized. IAS/Shunts: No atrial level shunt detected by color flow Doppler.  LEFT VENTRICLE PLAX 2D LVIDd:         3.20 cm LVIDs:         2.30 cm LV PW:         1.40 cm LV IVS:        1.05 cm  RIGHT VENTRICLE RV Basal diam:  4.40 cm RV S prime:     14.70 cm/s TAPSE (M-mode): 3.3 cm LEFT ATRIUM              Index        RIGHT ATRIUM           Index LA diam:        4.50 cm  2.28 cm/m   RA Area:     23.00 cm LA Vol (A2C):   106.0 ml 53.68 ml/m  RA Volume:   72.60 ml  36.77 ml/m LA Vol (A4C):   111.0 ml 56.22 ml/m LA Biplane Vol: 117.0 ml 59.25 ml/m                        PULMONIC VALVE AORTA                 PV Vmax:        0.84 m/s Ao Root diam: 3.37 cm PV Vmean:       57.800 cm/s                       PV VTI:  0.169 m                       PV Peak grad:   2.8 mmHg                       PV Mean grad:   2.0 mmHg                       RVOT Peak grad: 5 mmHg  MITRAL VALVE                TRICUSPID VALVE MV Area (PHT): 6.02 cm     TR Peak grad:   41.5 mmHg MV Decel Time: 126 msec     TR Vmax:        322.00 cm/s MV E velocity: 110.00 cm/s                             SHUNTS                             Pulmonic VTI: 0.177 m Kathlyn Sacramento MD Electronically signed by Kathlyn Sacramento MD Signature Date/Time: 03/15/2021/8:42:16 AM    Final    [place] EKG SITE RITE  Result Date: 03/13/2021 If Site Rite image not attached, placement could not  be confirmed due to current cardiac rhythm.  CT ANGIO GI BLEED  Result Date: 03/12/2021 CLINICAL DATA:  Hematemesis, melena, shortness of breath EXAM: CTA ABDOMEN AND PELVIS WITHOUT AND WITH CONTRAST TECHNIQUE: Multidetector CT imaging of the abdomen and pelvis was performed using the standard protocol during bolus administration of intravenous contrast. Multiplanar reconstructed images and MIPs were obtained and reviewed to evaluate the vascular anatomy. CONTRAST:  180mL OMNIPAQUE IOHEXOL 350 MG/ML SOLN COMPARISON:  None. FINDINGS: VASCULAR Aorta: Mild scattered calcified plaque. No aneurysm, dissection, or stenosis. Celiac: Patent without evidence of aneurysm, dissection, vasculitis or significant stenosis. SMA: Patent without evidence of aneurysm, dissection, vasculitis or significant stenosis. Replaced right hepatic arterial supply, an anatomic variant. Renals: Both renal arteries are patent without evidence of aneurysm, dissection, vasculitis, fibromuscular dysplasia or significant stenosis. IMA: Patent without evidence of aneurysm, dissection, vasculitis or significant stenosis. Inflow: Patent without evidence of aneurysm, dissection, vasculitis or significant stenosis. Proximal Outflow: Bilateral common femoral and visualized portions of the superficial and profunda femoral arteries are patent without evidence of aneurysm, dissection, vasculitis or significant stenosis. Veins: No obvious venous abnormality within the limitations of this arterial phase study. Review of the MIP images confirms the above findings. NON-VASCULAR Lower chest: Coronary calcifications.  Large hiatal hernia. Hepatobiliary: No focal liver abnormality is seen. No gallstones, gallbladder wall thickening, or biliary dilatation. Pancreas: Unremarkable. No pancreatic ductal dilatation or surrounding inflammatory changes. Spleen: Normal in size without focal abnormality. Small scattered calcified granulomas. Adrenals/Urinary Tract:  Adrenal glands unremarkable. 1.4 cm probable cyst, lower pole right kidney. 8.1 cm cyst, upper pole left kidney. No urolithiasis or hydronephrosis. Urinary bladder physiologically distended. Stomach/Bowel: Large hiatal hernia involving the gastric fundus, cardia, and portions of the body. The small bowel is decompressed, unremarkable. Normal appendix. The colon is nondilated, unremarkable. No evidence of active extravasation into the bowel. Lymphatic: No abdominal or pelvic adenopathy. Reproductive: Status post hysterectomy. No adnexal masses. Other: No ascites.  No free air. Musculoskeletal: Multilevel spondylitic changes in the lower thoracic and lumbar spine. Advanced facet DJD L5-S1 likely  accounts for the grade 1 anterolisthesis at this level. Negative for fracture or acute bone abnormality. IMPRESSION: 1. No active arterial extravasation or other acute findings. 2. Large hiatal hernia 3. Coronary and Aortic Atherosclerosis (ICD10-170.0). Electronically Signed   By: Lucrezia Europe M.D.   On: 03/12/2021 15:35    Subjective: Patient was seen and examined today.  Feeling much improved.  Able to ambulate with much less shortness of breath.  Daughter at bedside.  Wants to go home.  Discharge Exam: Vitals:   03/20/21 0437 03/20/21 0756  BP: (!) 107/55 108/73  Pulse: 80 90  Resp: 18 18  Temp: 97.9 F (36.6 C) 98.3 F (36.8 C)  SpO2: 94% 94%   Vitals:   03/19/21 2036 03/20/21 0021 03/20/21 0437 03/20/21 0756  BP: 108/63 (!) 96/55 (!) 107/55 108/73  Pulse: (!) 58 76 80 90  Resp: 19 18 18 18   Temp: 98.1 F (36.7 C) 98 F (36.7 C) 97.9 F (36.6 C) 98.3 F (36.8 C)  TempSrc:      SpO2: 93% 94% 94% 94%  Weight:      Height:        General: Pt is alert, awake, not in acute distress Cardiovascular: Irregularly irregular Respiratory: CTA bilaterally, no wheezing, no rhonchi Abdominal: Soft, NT, ND, bowel sounds + Extremities: no edema, no cyanosis   The results of significant diagnostics from  this hospitalization (including imaging, microbiology, ancillary and laboratory) are listed below for reference.    Microbiology: Recent Results (from the past 240 hour(s))  Resp Panel by RT-PCR (Flu A&B, Covid) Nasopharyngeal Swab     Status: None   Collection Time: 03/12/21  1:45 PM   Specimen: Nasopharyngeal Swab; Nasopharyngeal(NP) swabs in vial transport medium  Result Value Ref Range Status   SARS Coronavirus 2 by RT PCR NEGATIVE NEGATIVE Final    Comment: (NOTE) SARS-CoV-2 target nucleic acids are NOT DETECTED.  The SARS-CoV-2 RNA is generally detectable in upper respiratory specimens during the acute phase of infection. The lowest concentration of SARS-CoV-2 viral copies this assay can detect is 138 copies/mL. A negative result does not preclude SARS-Cov-2 infection and should not be used as the sole basis for treatment or other patient management decisions. A negative result may occur with  improper specimen collection/handling, submission of specimen other than nasopharyngeal swab, presence of viral mutation(s) within the areas targeted by this assay, and inadequate number of viral copies(<138 copies/mL). A negative result must be combined with clinical observations, patient history, and epidemiological information. The expected result is Negative.  Fact Sheet for Patients:  EntrepreneurPulse.com.au  Fact Sheet for Healthcare Providers:  IncredibleEmployment.be  This test is no t yet approved or cleared by the [place] FDA and  has been authorized for detection and/or diagnosis of SARS-CoV-2 by FDA under an Emergency Use Authorization (EUA). This EUA will remain  in effect (meaning this test can be used) for the duration of the COVID-19 declaration under Section 564(b)(1) of the Act, 21 U.S.C.section 360bbb-3(b)(1), unless the authorization is terminated  or revoked sooner.       Influenza A by PCR NEGATIVE NEGATIVE Final    Influenza B by PCR NEGATIVE NEGATIVE Final    Comment: (NOTE) The Xpert Xpress SARS-CoV-2/FLU/RSV plus assay is intended as an aid in the diagnosis of influenza from Nasopharyngeal swab specimens and should not be used as a sole basis for treatment. Nasal washings and aspirates are unacceptable for Xpert Xpress SARS-CoV-2/FLU/RSV testing.  Fact Sheet for Patients: EntrepreneurPulse.com.au  Fact Sheet for Healthcare Providers: IncredibleEmployment.be  This test is not yet approved or cleared by the [place] FDA and has been authorized for detection and/or diagnosis of SARS-CoV-2 by FDA under an Emergency Use Authorization (EUA). This EUA will remain in effect (meaning this test can be used) for the duration of the COVID-19 declaration under Section 564(b)(1) of the Act, 21 U.S.C. section 360bbb-3(b)(1), unless the authorization is terminated or revoked.  Performed at [facility], [address] [place], [place] [address]      Labs: BNP (last 3 results) No results for input(s): BNP in the last 8760 hours. Basic Metabolic Panel: Recent Labs  Lab 03/15/21 0500 03/16/21 0500 03/17/21 0527 03/18/21 0628 03/19/21 0237  NA 137 136 135 138 136  K 3.1* 3.5 3.5 3.8 4.2  CL 104 104 105 104 98  CO2 30 28 27 29 31   GLUCOSE 108* 118* 101* 101* 103*  BUN 14 11 11 12 21   CREATININE 0.67 0.80 0.73 0.78 0.82  CALCIUM 8.0* 8.4* 8.3* 8.6* 9.1  MG 1.6* 1.7 1.6* 1.5* 2.0   Liver Function Tests: No results for input(s): AST, ALT, ALKPHOS, BILITOT, PROT, ALBUMIN in the last 168 hours. No results for input(s): LIPASE, AMYLASE in the last 168 hours. No results for input(s): AMMONIA in the last 168 hours. CBC: Recent Labs  Lab 03/15/21 0500 03/16/21 0500 03/17/21 0527 03/18/21 0628 03/19/21 0237  WBC 7.1 9.1 9.2 9.3 11.7*  NEUTROABS 4.2 6.0 5.5 5.6 7.8*  HGB 8.2* 8.5* 8.4* 8.4* 9.0*  HCT 24.6* 25.7* 25.4* 26.3* 28.5*  MCV 100.4*  101.2* 101.6* 102.3* 103.3*  PLT 143* 150 165 187 227   Cardiac Enzymes: No results for input(s): CKTOTAL, CKMB, CKMBINDEX, TROPONINI in the last 168 hours. BNP: Invalid input(s): POCBNP CBG: No results for input(s): GLUCAP in the last 168 hours. D-Dimer No results for input(s): DDIMER in the last 72 hours. Hgb A1c No results for input(s): HGBA1C in the last 72 hours. Lipid Profile No results for input(s): CHOL, HDL, LDLCALC, TRIG, CHOLHDL, LDLDIRECT in the last 72 hours. Thyroid function studies No results for input(s): TSH, T4TOTAL, T3FREE, THYROIDAB in the last 72 hours.  Invalid input(s): FREET3 Anemia work up No results for input(s): VITAMINB12, FOLATE, FERRITIN, TIBC, IRON, RETICCTPCT in the last 72 hours. Urinalysis    Component Value Date/Time   COLORURINE YELLOW (A) 04/05/2017 1343   APPEARANCEUR CLEAR (A) 04/05/2017 1343   LABSPEC 1.008 04/05/2017 1343   PHURINE 6.0 04/05/2017 1343   GLUCOSEU NEGATIVE 04/05/2017 1343   HGBUR NEGATIVE 04/05/2017 1343   BILIRUBINUR NEGATIVE 04/05/2017 1343   KETONESUR NEGATIVE 04/05/2017 1343   PROTEINUR NEGATIVE 04/05/2017 1343   NITRITE NEGATIVE 04/05/2017 1343   LEUKOCYTESUR NEGATIVE 04/05/2017 1343   Sepsis Labs Invalid input(s): PROCALCITONIN,  WBC,  LACTICIDVEN Microbiology Recent Results (from the past 240 hour(s))  Resp Panel by RT-PCR (Flu A&B, Covid) Nasopharyngeal Swab     Status: None   Collection Time: 03/12/21  1:45 PM   Specimen: Nasopharyngeal Swab; Nasopharyngeal(NP) swabs in vial transport medium  Result Value Ref Range Status   SARS Coronavirus 2 by RT PCR NEGATIVE NEGATIVE Final    Comment: (NOTE) SARS-CoV-2 target nucleic acids are NOT DETECTED.  The SARS-CoV-2 RNA is generally detectable in upper respiratory specimens during the acute phase of infection. The lowest concentration of SARS-CoV-2 viral copies this assay can detect is 138 copies/mL. A negative result does not preclude SARS-Cov-2 infection  and should not be used as  the sole basis for treatment or other patient management decisions. A negative result may occur with  improper specimen collection/handling, submission of specimen other than nasopharyngeal swab, presence of viral mutation(s) within the areas targeted by this assay, and inadequate number of viral copies(<138 copies/mL). A negative result must be combined with clinical observations, patient history, and epidemiological information. The expected result is Negative.  Fact Sheet for Patients:  EntrepreneurPulse.com.au  Fact Sheet for Healthcare Providers:  IncredibleEmployment.be  This test is no t yet approved or cleared by the [place] FDA and  has been authorized for detection and/or diagnosis of SARS-CoV-2 by FDA under an Emergency Use Authorization (EUA). This EUA will remain  in effect (meaning this test can be used) for the duration of the COVID-19 declaration under Section 564(b)(1) of the Act, 21 U.S.C.section 360bbb-3(b)(1), unless the authorization is terminated  or revoked sooner.       Influenza A by PCR NEGATIVE NEGATIVE Final   Influenza B by PCR NEGATIVE NEGATIVE Final    Comment: (NOTE) The Xpert Xpress SARS-CoV-2/FLU/RSV plus assay is intended as an aid in the diagnosis of influenza from Nasopharyngeal swab specimens and should not be used as a sole basis for treatment. Nasal washings and aspirates are unacceptable for Xpert Xpress SARS-CoV-2/FLU/RSV testing.  Fact Sheet for Patients: EntrepreneurPulse.com.au  Fact Sheet for Healthcare Providers: IncredibleEmployment.be  This test is not yet approved or cleared by the [place] FDA and has been authorized for detection and/or diagnosis of SARS-CoV-2 by FDA under an Emergency Use Authorization (EUA). This EUA will remain in effect (meaning this test can be used) for the duration of the COVID-19 declaration  under Section 564(b)(1) of the Act, 21 U.S.C. section 360bbb-3(b)(1), unless the authorization is terminated or revoked.  Performed at [facility], [address] [place], [place] [address]     Time coordinating discharge: Over 30 minutes  SIGNED:  Lorella Nimrod, MD  [facility] 03/20/2021, 10:43 AM  If 7PM-7AM, please contact night-coverage [email or web address]  This record has been created using Systems analyst. Errors have been sought and corrected,but may not always be located. Such creation errors do not reflect on the standard of care.

## 2021-03-20 NOTE — Discharge Instructions (Signed)
We also increased the dose of Synthroid.  You need a repeat TSH in 4 to 6 weeks which can be done at your primary care office.

## 2021-03-20 NOTE — Progress Notes (Addendum)
Progress Note  Patient Name: Amanda Browning Date of Encounter: 03/20/2021  Primary Cardiologist: Kate Sable, MD  Subjective   Feels well this AM.  [name] around nurses station.  Some dyspnea but able to increase pace.  Eager to go home.  Inpatient Medications    Scheduled Meds:  aspirin EC  81 mg Oral Daily   Chlorhexidine Gluconate Cloth  6 each Topical Daily   digoxin  0.125 mg Oral Daily   diltiazem  180 mg Oral Daily   feeding supplement (GLUCERNA SHAKE)  237 mL Oral TID BM   furosemide  40 mg Intravenous Daily   levothyroxine  75 mcg Oral Q0600   metoprolol tartrate  100 mg Oral BID   pantoprazole  40 mg Oral BID   polyethylene glycol  17 g Oral BID   sertraline  50 mg Oral QHS   sodium chloride flush  10-40 mL Intracatheter Q12H   vitamin B-12  1,000 mcg Oral Daily   Continuous Infusions:  PRN Meds: dicyclomine, metoprolol tartrate, sodium chloride flush, sodium chloride flush   Vital Signs    Vitals:   03/19/21 2036 03/20/21 0021 03/20/21 0437 03/20/21 0756  BP: 108/63 (!) 96/55 (!) 107/55 108/73  Pulse: (!) 58 76 80 90  Resp: 19 18 18 18   Temp: 98.1 F (36.7 C) 98 F (36.7 C) 97.9 F (36.6 C) 98.3 F (36.8 C)  TempSrc:      SpO2: 93% 94% 94% 94%  Weight:      Height:        Intake/Output Summary (Last 24 hours) at 03/20/2021 1026 Last data filed at 03/20/2021 0950 Gross per 24 hour  Intake 480 ml  Output 2050 ml  Net -1570 ml   Filed Weights   03/12/21 1303 03/14/21 1139  Weight: 95.3 kg 95.3 kg    Physical Exam   GEN: Obese, in no acute distress.  HEENT: Grossly normal.  Neck: Supple, obese - difficult to gauge jvp.  No carotid bruits, or masses. Cardiac: IR, IR no murmurs, rubs, or gallops. No clubbing, cyanosis, edema.  Radials 2+, DP/PT 2+ and equal bilaterally.  Respiratory:  Respirations regular and unlabored, diminished breath sounds bilat bases, otw CTA. GI: Obese, soft, nontender, nondistended, BS + x 4. MS: no  deformity or atrophy. Skin: warm and dry, no rash. Neuro:  Strength and sensation are intact. Psych: AAOx3.  Normal affect.  Labs    Chemistry Recent Labs  Lab 03/17/21 0527 03/18/21 0628 03/19/21 0237  NA 135 138 136  K 3.5 3.8 4.2  CL 105 104 98  CO2 27 29 31   GLUCOSE 101* 101* 103*  BUN 11 12 21   CREATININE 0.73 0.78 0.82  CALCIUM 8.3* 8.6* 9.1  GFRNONAA >60 >60 >60  ANIONGAP 3* 5 7     Hematology Recent Labs  Lab 03/17/21 0527 03/18/21 0628 03/19/21 0237  WBC 9.2 9.3 11.7*  RBC 2.50* 2.57* 2.76*  HGB 8.4* 8.4* 9.0*  HCT 25.4* 26.3* 28.5*  MCV 101.6* 102.3* 103.3*  MCH 33.6 32.7 32.6  MCHC 33.1 31.9 31.6  RDW 15.4 16.0* 16.3*  PLT 165 187 227    Cardiac Enzymes  Recent Labs  Lab 03/14/21 0950  TROPONINIHS 12   Lipids  Lab Results  Component Value Date   CHOL 192 01/12/2021   HDL 77.10 01/12/2021   LDLCALC 94 01/12/2021   LDLDIRECT 131.0 03/19/2013   TRIG 105.0 01/12/2021   CHOLHDL 2 01/12/2021    HbA1c  Lab  Results  Component Value Date   HGBA1C 5.9 01/12/2021    Radiology    No results found.  Telemetry    Afib 80's to 90's - Personally Reviewed  Cardiac Studies   Echocardiogram  1. Left ventricular ejection fraction, by estimation, is 55 to 60%. The  left ventricle has normal function. The left ventricle has no regional  wall motion abnormalities. There is mild left ventricular hypertrophy.  Left ventricular diastolic parameters  are indeterminate.   2. Right ventricular systolic function is normal. The right ventricular  size is normal. There is moderately elevated pulmonary artery systolic  pressure. The estimated right ventricular systolic pressure is 16.9 mmHg.   3. Left atrial size was moderately dilated.   4. Right atrial size was moderately dilated.   5. The mitral valve is normal in structure. Mild mitral valve  regurgitation. No evidence of mitral stenosis.   6. Tricuspid valve regurgitation is moderate.   7. The  aortic valve is normal in structure. Aortic valve regurgitation is  not visualized. No aortic stenosis is present.   Patient Profile     75 y.o. female with a hx of hypertension, hyperlipidemia who was admitted 03/12/21 2/2 GIB and found to have A. fib RVR and acute diastolic CHF.  Assessment & Plan    1.  GIB/macrocytic anemia:  Admitted w/ 4 day h/o bleeding - initially hypotensive and weak w/ Hgb of 7.2.  S/p PRBCs.  EGD 11/28 w/ ulcers noted, no active bleeding.  Large HH.  Labs stable yesterday - none drawn today.  Plan to hold off on eliquis for 2 wks per GI.  Cont PPI.  2.  AFib RVR:  rates 80's to 90's overnight/this AM but rise into low 100s and briefly into 120's w/ ambulation.  Some DOE but feels that she is able to walk a little faster.  On digoxin 0.125 mg daily, dilt 180 daily, and metoprolol 100 BID.  Pressures soft.  Will increase digoxin to 0.25mg  daily (renal fxn nl).  I'll ask office to reach out ot her for f/u in 7-10 days.  As above, no eliquis x 2 wks per GI.  In that setting, rhythm mgmt not currently an option.  Plan f/u labs @ office visit.  If stable, would plan 3-4 wks of Rx [place] followed by [facility].  Discussed w/ pt and family member today.    3.  Acute diastolic CHF:  Nl EF by echo this admission w/ mod elevated R heart pressures.  Has been receiving IV lasix w/ good response  Minus 1.6L overnight and minus 7.5L since admission.  No weights recorded.  Still some DOE but appears euvolemic on exam - body habitus makes exam somewhat challenging.  Will transition lasix to 40mg  po daily and plan to f/u bmet @ office f/u.    Signed, Murray Hodgkins, NP  03/20/2021, 10:26 AM    For questions or updates, please contact   Please consult [email or web address] for contact info under Cardiology/STEMI.    Addendum:  Patient was discharged before cardiology attending was able to see her.  Plan as above  Thompson Grayer MD, [place] 03/20/2021 2:10 PM

## 2021-03-22 ENCOUNTER — Telehealth: Payer: Self-pay | Admitting: Internal Medicine

## 2021-03-22 ENCOUNTER — Telehealth: Payer: Self-pay | Admitting: Cardiology

## 2021-03-22 DIAGNOSIS — R7989 Other specified abnormal findings of blood chemistry: Secondary | ICD-10-CM

## 2021-03-22 NOTE — Telephone Encounter (Addendum)
  Pending provider change approval.      Would you pls arrange for 7-14 day f/u w/ me or any of the other APPs, or [facility], and reach out to pt?  Thanks,  Gerald Stabs

## 2021-03-22 NOTE — Telephone Encounter (Signed)
Patient has an appointment with PCP 04/19/21 patient daughter asking since patient seeing cardiology to day does she need to follow up with you before appt.

## 2021-03-22 NOTE — Telephone Encounter (Signed)
Will follow note to clarify if TCM to be completed at this time.

## 2021-03-22 NOTE — Telephone Encounter (Signed)
Patient would like to change providers .  Seen as new patient at [facility] and would like to fu with [place] .   Per policy both providers to agree.    Please advise if ok to change .

## 2021-03-22 NOTE — Telephone Encounter (Signed)
Pt daughter called in stating that Pt was in the hospital. Pt daughter stated that the doctor requested the Pt to follow up with Pcp. Advise Pt of next avail appt for Jan 4,2023. Pt daughter stated that she already have an appt for Jan with Dr. Nicki Reaper. Pt daughter stated that Pt is following up with her cardiologist that she don't think that Pt needs to follow up with PCP right away.

## 2021-03-23 ENCOUNTER — Telehealth: Payer: Self-pay

## 2021-03-23 NOTE — Telephone Encounter (Signed)
Please notify daughter that I can work her in before January for hospital follow up.  Please clarify her desire.  She is scheduled to see cardiology 03/26/21.  Let me know when needs to be seen and I will work her in.  She does need cbc and met b (per discharge summary) one week after discharge.  (This may be ordered by cardiology).  Just let me know what is needed.

## 2021-03-23 NOTE — Telephone Encounter (Signed)
Transition Care Management Unsuccessful Follow-up Telephone Call  Date of discharge and from where:  03/20/21-[facility]  Attempts:  1st Attempt  Reason for unsuccessful TCM follow-up call:  No answer/busy

## 2021-03-24 NOTE — Telephone Encounter (Signed)
Transition Care Management Unsuccessful Follow-up Telephone Call  Date of discharge and from where:  03/20/21-[facility]  Attempts:  2nd Attempt  Reason for unsuccessful TCM follow-up call:  Unable to leave message

## 2021-03-25 NOTE — Telephone Encounter (Signed)
Transition Care Management Follow-up Telephone Call Date of discharge and from where: 03/20/21 [facility] How have you been since you were released from the hospital? I am fatigued and jelly legged. Denies falls, shortness of breath, n/v/d, fever, bleeding, pain and all other symptoms. Any questions or concerns? No  Items Reviewed: Did the pt receive and understand the discharge instructions provided? Yes  Medications obtained and verified? Yes  Any new allergies since your discharge? No Dietary orders reviewed? Yes, low sodium and caffeine. Heart healthy/carb modified.  Do you have support at home? Yes . Daughter lives next door if needed.   Home Care and Equipment/Supplies: Were home health services ordered? no  Functional Questionnaire: (I = Independent and D = Dependent) ADLs: I with use of rolling walker.  Follow up appointments reviewed:  PCP Hospital f/u appt confirmed? Work in as needed. Patient states,"If January is the soonest appointment then I will keep the standing appointment. However, I will come sooner if there is availability."  Nurse made patient aware can be worked in with pcp. Awaiting call back for work in appointment. Willing to try to come tomorrow if call back on today.  Deferred to pcp.  [facility] f/u appt confirmed? Yes  Scheduled to see Cardiology on 03/26/21 @ 1:20. Plans to schedule with gastroenterology post visit with Cardiology.  Are transportation arrangements needed? No  If their condition worsens, is the pt aware to call PCP or go to the Emergency Dept.? Yes Was the patient provided with contact information for the PCP's office or ED? Yes Was to pt encouraged to call back with questions or concerns? Yes

## 2021-03-25 NOTE — Telephone Encounter (Signed)
Called and spoke to Ms Tozzi.  Discussed work in appt.  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  She is seeing cardiology tomorrow.  Will plan to see them and then we will plan f/u after cardiology appt.  Pt in agreement.  Feeling better.

## 2021-03-26 ENCOUNTER — Ambulatory Visit: Payer: Medicare HMO | Admitting: Cardiology

## 2021-03-26 ENCOUNTER — Encounter: Payer: Self-pay | Admitting: Cardiology

## 2021-03-26 ENCOUNTER — Other Ambulatory Visit: Payer: Self-pay

## 2021-03-26 ENCOUNTER — Other Ambulatory Visit
Admission: RE | Admit: 2021-03-26 | Discharge: 2021-03-26 | Disposition: A | Discharge status: Home / Self Care | Payer: Medicare HMO | Attending: Cardiology | Admitting: Cardiology

## 2021-03-26 ENCOUNTER — Telehealth: Payer: Self-pay | Admitting: Internal Medicine

## 2021-03-26 VITALS — BP 128/86 | HR 70 | Ht 63.0 in | Wt 215.2 lb

## 2021-03-26 DIAGNOSIS — I4819 Other persistent atrial fibrillation: Secondary | ICD-10-CM | POA: Insufficient documentation

## 2021-03-26 DIAGNOSIS — R0600 Dyspnea, unspecified: Secondary | ICD-10-CM | POA: Diagnosis not present

## 2021-03-26 DIAGNOSIS — I10 Essential (primary) hypertension: Secondary | ICD-10-CM | POA: Diagnosis not present

## 2021-03-26 DIAGNOSIS — R0683 Snoring: Secondary | ICD-10-CM

## 2021-03-26 LAB — HEMOGLOBIN AND HEMATOCRIT, BLOOD
HCT: 31.6 % — ABNORMAL LOW (ref 36.0–46.0)
Hemoglobin: 10.2 g/dL — ABNORMAL LOW (ref 12.0–15.0)

## 2021-03-26 MED ORDER — APIXABAN 5 MG PO TABS: 5.0000 mg | ORAL_TABLET | Freq: Two times a day (BID) | ORAL | 3 refills | Status: DC

## 2021-03-26 MED ORDER — DILTIAZEM HCL ER COATED BEADS 240 MG PO CP24
240.0000 mg | ORAL_CAPSULE | Freq: Every day | ORAL | 3 refills | Status: DC
Start: 2021-03-26 — End: 2021-03-26

## 2021-03-26 MED ORDER — DILTIAZEM HCL ER COATED BEADS 240 MG PO CP24
240.0000 mg | ORAL_CAPSULE | Freq: Every day | ORAL | 3 refills | Status: DC
Start: 2021-03-26 — End: 2021-05-04

## 2021-03-26 NOTE — Progress Notes (Signed)
Cardiology Office Note:    Date:  03/26/2021   ID:  Norman, Bier 05/21/1945, MRN [id number]  PCP:  Einar Pheasant, MD   [facility] Providers Cardiologist:  Ida Rogue, MD     Referring MD: Einar Pheasant, MD   Chief Complaint  Patient presents with   Hospitalization Follow-up    Afib     History of Present Illness:    Amanda Browning is a 75 y.o. female with a hx of atrial fibrillation, hypertension, hyperlipidemia who presents for follow-up.  Initially seen by myself last month in the hospital after being admitted for GI bleed.  EKG showed atrial fibrillation with rapid ventricular response.  Eliquis was held due to GI bleeding.  Evaluated by gastroenterology, recommendations was to hold Eliquis for 2 weeks before resuming.  Rate controlled with diltiazem, digoxin.  Echocardiogram 02/2021 EF 55 to 60%, moderately dilated LA.  Patient endorses daytime fatigue, daytime somnolence and snoring.  Edema is well controlled on current dose of torsemide.    Past Medical History:  Diagnosis Date   Anemia    Chicken pox    Colon polyps    Complication of anesthesia    doesn't take much anesthesia to put pt to sleep, slow to wake up   DDD (degenerative disc disease), cervical 02/05/13   C5-C6 and C6-C7   GERD (gastroesophageal reflux disease)    History of chicken pox    Hypercholesterolemia    Hyperlipidemia    Hypertension    Hypothyroidism    Migraines    PONV (postoperative nausea and vomiting)    Squamous cell skin cancer     Past Surgical History:  Procedure Laterality Date   BREAST BIOPSY Right 2012   stereotactic   COLONOSCOPY WITH PROPOFOL N/A 02/23/2018   Procedure: COLONOSCOPY WITH PROPOFOL;  Surgeon: Manya Silvas, MD;  Location: [facility] ENDOSCOPY;  Service: Endoscopy;  Laterality: N/A;   ESOPHAGOGASTRODUODENOSCOPY N/A 03/15/2021   Procedure: ESOPHAGOGASTRODUODENOSCOPY (EGD);  Surgeon: Lin Landsman, MD;  Location: [facility] ENDOSCOPY;   Service: Gastroenterology;  Laterality: N/A;   KNEE ARTHROPLASTY Right 04/17/2017   Procedure: COMPUTER ASSISTED TOTAL KNEE ARTHROPLASTY;  Surgeon: Dereck Leep, MD;  Location: [facility] ORS;  Service: Orthopedics;  Laterality: Right;   SKIN BIOPSY     VAGINAL HYSTERECTOMY  1994   ovaries not removed   WISDOM TOOTH EXTRACTION      Current Medications: Current Meds  Medication Sig   acetaminophen (TYLENOL) 325 MG tablet Take 650 mg by mouth 4 (four) times daily as needed.   albuterol (VENTOLIN HFA) 108 (90 Base) MCG/ACT inhaler Inhale 2 puffs into the lungs every 6 (six) hours as needed for wheezing or shortness of breath.   aspirin EC 81 MG tablet Take 81 mg by mouth daily.   cetirizine (ZYRTEC) 10 MG tablet Take 10 mg by mouth daily.   ferrous sulfate 325 (65 FE) MG tablet Take 325 mg by mouth daily with breakfast.   levothyroxine (SYNTHROID) 75 MCG tablet Take 1 tablet (75 mcg total) by mouth daily at 6 (six) AM.   lovastatin (MEVACOR) 40 MG tablet TAKE 1 TABLET AT BEDTIME   magnesium oxide (MAG-OX) 400 MG tablet Take 1 tablet (400 mg total) by mouth 2 (two) times daily.   metoprolol tartrate (LOPRESSOR) 100 MG tablet Take 1 tablet (100 mg total) by mouth 2 (two) times daily.   Multiple Vitamin (MULTIVITAMIN) tablet Take 1 tablet by mouth daily.   omeprazole (PRILOSEC) 20 MG capsule TAKE  1 CAPSULE TWICE DAILY BEFORE MEALS   sertraline (ZOLOFT) 50 MG tablet TAKE 1/2 TABLET DAILY FOR 1 WEEK, THEN TAKE 1 TABLET DAILY (Patient taking differently: Take 50 mg by mouth at bedtime.)   torsemide (DEMADEX) 20 MG tablet Take 1 tablet (20 mg total) by mouth daily.   vitamin B-12 (CYANOCOBALAMIN) 1000 MCG tablet Take 1 tablet by mouth daily.   [DISCONTINUED] apixaban (ELIQUIS) 5 MG TABS tablet Take 1 tablet (5 mg total) by mouth 2 (two) times daily.   [DISCONTINUED] digoxin (LANOXIN) 0.25 MG tablet Take 1 tablet (0.25 mg total) by mouth daily.   [DISCONTINUED] diltiazem (CARDIZEM CD) 180 MG 24 hr  capsule Take 1 capsule (180 mg total) by mouth daily.     Allergies:   Codeine   Social History   Socioeconomic History   Marital status: Divorced    Spouse name: Not on file   Number of children: 2   Years of education: 12   Highest education level: High school graduate  Occupational History   Not on file  Tobacco Use   Smoking status: Never   Smokeless tobacco: Never  Vaping Use   Vaping Use: Never used  Substance and Sexual Activity   Alcohol use: No    Alcohol/week: 0.0 standard drinks   Drug use: No   Sexual activity: Never  Other Topics Concern   Not on file  Social History Narrative   Full Code   Alcohol - none   Never smoker and no smokeless tobacco   2 daughters   Social Determinants of Radio broadcast assistant Strain: Not on file  Food Insecurity: Not on file  Transportation Needs: Not on file  Physical Activity: Not on file  Stress: Not on file  Social Connections: Not on file     Family History: The patient's family history includes Breast cancer (age of onset: 51) in her sister; Cancer in her sister; Diabetes type II in her brother; Heart attack in her father and mother; Heart disease in her father and mother; Hyperlipidemia in her father; Hypertension in her brother, father, and mother; Ovarian cancer in her paternal aunt; Stroke in her father and mother. There is no history of Colon cancer, Basal cell carcinoma, Melanoma, or Squamous cell carcinoma.  ROS:   Please see the history of present illness.     All other systems reviewed and are negative.  EKGs/Labs/Other Studies Reviewed:    The following studies were reviewed today:   EKG:  EKG is  ordered today.  The ekg ordered today demonstrates atrial fibrillation, heart rate 70  Recent Labs: 03/13/2021: ALT 15 03/14/2021: TSH 6.280 03/19/2021: BUN 21; Creatinine, Ser 0.82; Magnesium 2.0; Platelets 227; Potassium 4.2; Sodium 136 03/26/2021: Hemoglobin 10.2  Recent Lipid Panel    Component  Value Date/Time   CHOL 192 01/12/2021 0846   TRIG 105.0 01/12/2021 0846   HDL 77.10 01/12/2021 0846   CHOLHDL 2 01/12/2021 0846   VLDL 21.0 01/12/2021 0846   LDLCALC 94 01/12/2021 0846   LDLCALC 95 01/20/2017 1616   LDLDIRECT 131.0 03/19/2013 0825     Risk Assessment/Calculations:          Physical Exam:    VS:  BP 128/86 (BP Location: Right Arm)   Pulse 70   Ht 5\' 3"  (1.6 m)   Wt 215 lb 3.2 oz (97.6 kg)   SpO2 97%   BMI 38.12 kg/m     Wt Readings from Last 3 Encounters:  03/26/21 215 lb  3.2 oz (97.6 kg)  03/14/21 210 lb 1.6 oz (95.3 kg)  01/12/21 215 lb (97.5 kg)     GEN:  Well nourished, well developed in no acute distress HEENT: Normal NECK: No JVD; No carotid bruits LYMPHATICS: No lymphadenopathy CARDIAC: Irregularly irregular RESPIRATORY:  Clear to auscultation without rales, wheezing or rhonchi  ABDOMEN: Soft, non-tender, non-distended MUSCULOSKELETAL:  No edema; No deformity  SKIN: Warm and dry NEUROLOGIC:  Alert and oriented x 3 PSYCHIATRIC:  Normal affect   ASSESSMENT:    1. Persistent atrial fibrillation ([place])   2. Primary hypertension   3. Dyspnea, unspecified type   4. Snoring    PLAN:    In order of problems listed above:  Persistent atrial fibrillation: Rate controlled on Cardizem, digoxin, Lopressor.  Stop digoxin, increase Cardizem to 240 mg daily.  Start Eliquis 5 mg twice daily.  Check H&H today, H&H in 1 week.  If H&H stays stable on Eliquis, consider DC cardioversion. Hypertension, BP controlled.  Cardizem, Lopressor 100 mg twice daily. Dyspnea on exertion, likely from obesity, HFpEF, no edema.  Continue torsemide. Patient endorsed snoring, daytime somnolence, daytime fatigue.  Referral to pulmonary medicine for OSA eval.  Follow-up in 4 to 6 weeks with Dr. Rockey Situ or APP.   Medication Adjustments/Labs and Tests Ordered: Current medicines are reviewed at length with the patient today.  Concerns regarding medicines are outlined above.   Orders Placed This Encounter  Procedures   Hemoglobin and hematocrit, blood   Hemoglobin and hematocrit, blood   Hemoglobin and hematocrit, blood   Hemoglobin and hematocrit, blood   Ambulatory referral to Pulmonology   EKG 12-Lead    Meds ordered this encounter  Medications   DISCONTD: diltiazem (CARDIZEM CD) 240 MG 24 hr capsule    Sig: Take 1 capsule (240 mg total) by mouth daily.    Dispense:  30 capsule    Refill:  3   DISCONTD: apixaban (ELIQUIS) 5 MG TABS tablet    Sig: Take 1 tablet (5 mg total) by mouth 2 (two) times daily.    Dispense:  60 tablet    Refill:  3   apixaban (ELIQUIS) 5 MG TABS tablet    Sig: Take 1 tablet (5 mg total) by mouth 2 (two) times daily.    Dispense:  60 tablet    Refill:  3   diltiazem (CARDIZEM CD) 240 MG 24 hr capsule    Sig: Take 1 capsule (240 mg total) by mouth daily.    Dispense:  30 capsule    Refill:  3     Patient Instructions  Medication Instructions:   Your physician has recommended you make the following change in your medication:    STOP taking your Digoxin.  2.   INCREASE your Cardizem to 240 MG once a day.  3.   START taking Eliquis 5 MG twice a day.  *If you need a refill on your cardiac medications before your next appointment, please call your pharmacy*   Lab Work:  - Please go to the [facility]. You will check in at the front desk to the right as you walk into the atrium. Valet Parking is offered if needed. - No appointment needed. You may go any day between 7 am and 6 pm.  Get and H/H drawn today at the medical mall.  2.    Your physician recommends that you return for lab work (H/H) in: 1 WEEK   Please return to our office on_____________________at______________am/pm  Testing/Procedures: None Ordered   Follow-Up: At [facility], you and your health needs are our priority.  As part of our continuing mission to provide you with exceptional heart care, we have created designated Provider  Care Teams.  These Care Teams include your primary Cardiologist (physician) and Advanced Practice Providers (APPs -  Physician Assistants and Nurse Practitioners) who all work together to provide you with the care you need, when you need it.  We recommend signing up for the patient portal called "MyChart".  Sign up information is provided on this After Visit Summary.  MyChart is used to connect with patients for Virtual Visits (Telemedicine).  Patients are able to view lab/test results, encounter notes, upcoming appointments, etc.  Non-urgent messages can be sent to your provider as well.   To learn more about what you can do with MyChart, go to [email or web address].    Your next appointment:   3 week(s)  The format for your next appointment:   In Person  Provider:   You may see Ida Rogue, MD or one of the following Advanced Practice Providers on your designated Care Team:   Murray Hodgkins, NP Christell Faith, PA-C Cadence Kathlen Mody, [place]   Other Instructions    Signed, Kate Sable, MD  03/26/2021 4:55 PM    [place]

## 2021-03-26 NOTE — Telephone Encounter (Signed)
Please schedule for f/u appt 03/30/21 at 12:00.  Had discussed with pt last week about a f/u appt week of 03/29/21.

## 2021-03-26 NOTE — Patient Instructions (Signed)
Medication Instructions:   Your physician has recommended you make the following change in your medication:    STOP taking your Digoxin.  2.   INCREASE your Cardizem to 240 MG once a day.  3.   START taking Eliquis 5 MG twice a day.  *If you need a refill on your cardiac medications before your next appointment, please call your pharmacy*   Lab Work:  - Please go to the [facility]. You will check in at the front desk to the right as you walk into the atrium. Valet Parking is offered if needed. - No appointment needed. You may go any day between 7 am and 6 pm.  Get and H/H drawn today at the medical mall.  2.    Your physician recommends that you return for lab work (H/H) in: 1 WEEK   Please return to our office on_____________________at______________am/pm    Testing/Procedures: None Ordered   Follow-Up: At [facility], you and your health needs are our priority.  As part of our continuing mission to provide you with exceptional heart care, we have created designated Provider Care Teams.  These Care Teams include your primary Cardiologist (physician) and Advanced Practice Providers (APPs -  Physician Assistants and Nurse Practitioners) who all work together to provide you with the care you need, when you need it.  We recommend signing up for the patient portal called "MyChart".  Sign up information is provided on this After Visit Summary.  MyChart is used to connect with patients for Virtual Visits (Telemedicine).  Patients are able to view lab/test results, encounter notes, upcoming appointments, etc.  Non-urgent messages can be sent to your provider as well.   To learn more about what you can do with MyChart, go to [email or web address].    Your next appointment:   3 week(s)  The format for your next appointment:   In Person  Provider:   You may see Ida Rogue, MD or one of the following Advanced Practice Providers on your designated Care Team:    Murray Hodgkins, NP Christell Faith, PA-C Cadence Kathlen Mody, [place]   Other Instructions

## 2021-03-29 NOTE — Telephone Encounter (Signed)
Pt scheduled and aware of appt date and time.

## 2021-03-30 ENCOUNTER — Ambulatory Visit (INDEPENDENT_AMBULATORY_CARE_PROVIDER_SITE_OTHER): Payer: Medicare HMO | Admitting: Internal Medicine

## 2021-03-30 ENCOUNTER — Other Ambulatory Visit: Payer: Self-pay

## 2021-03-30 ENCOUNTER — Encounter: Payer: Self-pay | Admitting: Internal Medicine

## 2021-03-30 VITALS — BP 126/70 | HR 70 | Temp 97.5°F | Resp 16 | Wt 216.8 lb

## 2021-03-30 DIAGNOSIS — I10 Essential (primary) hypertension: Secondary | ICD-10-CM | POA: Diagnosis not present

## 2021-03-30 DIAGNOSIS — D509 Iron deficiency anemia, unspecified: Secondary | ICD-10-CM

## 2021-03-30 DIAGNOSIS — I4891 Unspecified atrial fibrillation: Secondary | ICD-10-CM | POA: Diagnosis not present

## 2021-03-30 DIAGNOSIS — K449 Diaphragmatic hernia without obstruction or gangrene: Secondary | ICD-10-CM

## 2021-03-30 DIAGNOSIS — D649 Anemia, unspecified: Secondary | ICD-10-CM

## 2021-03-30 DIAGNOSIS — E039 Hypothyroidism, unspecified: Secondary | ICD-10-CM | POA: Diagnosis not present

## 2021-03-30 DIAGNOSIS — F439 Reaction to severe stress, unspecified: Secondary | ICD-10-CM | POA: Diagnosis not present

## 2021-03-30 DIAGNOSIS — K257 Chronic gastric ulcer without hemorrhage or perforation: Secondary | ICD-10-CM | POA: Diagnosis not present

## 2021-03-30 DIAGNOSIS — R0602 Shortness of breath: Secondary | ICD-10-CM | POA: Diagnosis not present

## 2021-03-30 DIAGNOSIS — K2100 Gastro-esophageal reflux disease with esophagitis, without bleeding: Secondary | ICD-10-CM

## 2021-03-30 DIAGNOSIS — E7800 Pure hypercholesterolemia, unspecified: Secondary | ICD-10-CM

## 2021-03-30 LAB — CBC WITH DIFFERENTIAL/PLATELET
Basophils Absolute: 0.1 10*3/uL (ref 0.0–0.1)
Basophils Relative: 0.7 % (ref 0.0–3.0)
Eosinophils Absolute: 0.3 10*3/uL (ref 0.0–0.7)
Eosinophils Relative: 2.4 % (ref 0.0–5.0)
HCT: 32.2 % — ABNORMAL LOW (ref 36.0–46.0)
Hemoglobin: 10.4 g/dL — ABNORMAL LOW (ref 12.0–15.0)
Lymphocytes Relative: 18 % (ref 12.0–46.0)
Lymphs Abs: 2 10*3/uL (ref 0.7–4.0)
MCHC: 32.3 g/dL (ref 30.0–36.0)
MCV: 99.7 fl (ref 78.0–100.0)
Monocytes Absolute: 1 10*3/uL (ref 0.1–1.0)
Monocytes Relative: 8.6 % (ref 3.0–12.0)
Neutro Abs: 8 10*3/uL — ABNORMAL HIGH (ref 1.4–7.7)
Neutrophils Relative %: 70.3 % (ref 43.0–77.0)
Platelets: 316 10*3/uL (ref 150.0–400.0)
RBC: 3.23 Mil/uL — ABNORMAL LOW (ref 3.87–5.11)
RDW: 17.1 % — ABNORMAL HIGH (ref 11.5–15.5)
WBC: 11.4 10*3/uL — ABNORMAL HIGH (ref 4.0–10.5)

## 2021-03-30 LAB — BASIC METABOLIC PANEL
BUN: 28 mg/dL — ABNORMAL HIGH (ref 6–23)
CO2: 37 mEq/L — ABNORMAL HIGH (ref 19–32)
Calcium: 9.4 mg/dL (ref 8.4–10.5)
Chloride: 96 mEq/L (ref 96–112)
Creatinine, Ser: 1.39 mg/dL — ABNORMAL HIGH (ref 0.40–1.20)
GFR: 37.19 mL/min — ABNORMAL LOW (ref >60.00)
Glucose, Bld: 105 mg/dL — ABNORMAL HIGH (ref 70–99)
Potassium: 3.7 mEq/L (ref 3.5–5.1)
Sodium: 142 mEq/L (ref 135–145)

## 2021-03-30 LAB — FERRITIN: Ferritin: 64.7 ng/mL (ref 10.0–291.0)

## 2021-03-30 NOTE — Progress Notes (Signed)
Patient ID: Amanda Browning, female   DOB: 08-17-1945, 75 y.o.   MRN: [id number]   Subjective:    Patient ID: Amanda Browning, female    DOB: [birth year], 75 y.o.   MRN: [id number]  This visit occurred during the SARS-CoV-2 public health emergency.  Safety protocols were in place, including screening questions prior to the visit, additional usage of staff PPE, and extensive cleaning of exam room while observing appropriate contact time as indicated for disinfecting solutions.   Patient here for hospital follow up.   Chief Complaint  Patient presents with   Hospitalization Follow-up   .   HPI Admitted 03/12/21 - 03/20/21 with dark stools and dark colored vomiting.  Hgb 7.2. transfused 1 unit of PRBC. CT angio - no active arterial extravasation or other acute findings.  EGD - 03/15/21 - hiatal hernia and ulcer with no active bleeding.  Recommended PPI bid.  Also developed new onset of afib with RVR requiring cardizem infusion.  Cardiology consulted and was transitioned to po cardizem and lopressor.  Digoxin also added. Prior to her discharge, it appears she was started on torsemide for "extra fluid".  After discharge, she saw cardiology 03/26/21 and cardizem was increased to 240mg  q day.  Digoxin was stopped.  Since discharge, she has had no further bleeding.  No melana.  No nausea or vomiting.  She is eating.  Some minimal constipation.  Discussed miralax. No chest pain.  Reports persistent sob with exertion.  Stable.  No abdominal pain.  Fatigue.  No increased cough or congestion.     Past Medical History:  Diagnosis Date   Anemia    Chicken pox    Colon polyps    Complication of anesthesia    doesn't take much anesthesia to put pt to sleep, slow to wake up   DDD (degenerative disc disease), cervical 02/05/13   C5-C6 and C6-C7   GERD (gastroesophageal reflux disease)    History of chicken pox    Hypercholesterolemia    Hyperlipidemia    Hypertension    Hypothyroidism    Migraines     PONV (postoperative nausea and vomiting)    Squamous cell skin cancer    Past Surgical History:  Procedure Laterality Date   BREAST BIOPSY Right 2012   stereotactic   COLONOSCOPY WITH PROPOFOL N/A 02/23/2018   Procedure: COLONOSCOPY WITH PROPOFOL;  Surgeon: Manya Silvas, MD;  Location: [facility] ENDOSCOPY;  Service: Endoscopy;  Laterality: N/A;   ESOPHAGOGASTRODUODENOSCOPY N/A 03/15/2021   Procedure: ESOPHAGOGASTRODUODENOSCOPY (EGD);  Surgeon: Lin Landsman, MD;  Location: [facility] ENDOSCOPY;  Service: Gastroenterology;  Laterality: N/A;   KNEE ARTHROPLASTY Right 04/17/2017   Procedure: COMPUTER ASSISTED TOTAL KNEE ARTHROPLASTY;  Surgeon: Dereck Leep, MD;  Location: [facility] ORS;  Service: Orthopedics;  Laterality: Right;   SKIN BIOPSY     VAGINAL HYSTERECTOMY  1994   ovaries not removed   WISDOM TOOTH EXTRACTION     Family History  Problem Relation Age of Onset   Heart disease Mother    Stroke Mother    Hypertension Mother    Heart attack Mother    Heart disease Father        enlarged heart   Stroke Father    Hypertension Father    Hyperlipidemia Father    Heart attack Father    Ovarian cancer Paternal Aunt    Breast cancer Sister 38   Cancer Sister        Breast Cancer   Diabetes type II  Brother    Hypertension Brother    Colon cancer Neg Hx    Basal cell carcinoma Neg Hx    Melanoma Neg Hx    Squamous cell carcinoma Neg Hx    Social History   Socioeconomic History   Marital status: Divorced    Spouse name: Not on file   Number of children: 2   Years of education: 12   Highest education level: High school graduate  Occupational History   Not on file  Tobacco Use   Smoking status: Never   Smokeless tobacco: Never  Vaping Use   Vaping Use: Never used  Substance and Sexual Activity   Alcohol use: No    Alcohol/week: 0.0 standard drinks   Drug use: No   Sexual activity: Never  Other Topics Concern   Not on file  Social History Narrative   Full Code    Alcohol - none   Never smoker and no smokeless tobacco   2 daughters   Social Determinants of Health   Financial Resource Strain: Not on file  Food Insecurity: Not on file  Transportation Needs: Not on file  Physical Activity: Not on file  Stress: Not on file  Social Connections: Not on file     Review of Systems  Constitutional:  Positive for fatigue. Negative for unexpected weight change.  HENT:  Negative for congestion and sinus pressure.   Respiratory:  Positive for shortness of breath. Negative for cough and chest tightness.   Cardiovascular:  Negative for chest pain and palpitations.  Gastrointestinal:  Negative for abdominal pain, diarrhea and vomiting.  Genitourinary:  Negative for difficulty urinating and dysuria.  Musculoskeletal:  Negative for joint swelling and myalgias.  Skin:  Negative for color change and rash.  Neurological:  Negative for dizziness, light-headedness and headaches.  Psychiatric/Behavioral:  Negative for agitation and dysphoric mood.       Objective:     BP 126/70    Pulse 70    Temp (!) 97.5 F (36.4 C)    Resp 16    Wt 216 lb 12.8 oz (98.3 kg)    SpO2 96%    BMI 38.40 kg/m  Wt Readings from Last 3 Encounters:  03/30/21 216 lb 12.8 oz (98.3 kg)  03/26/21 215 lb 3.2 oz (97.6 kg)  03/14/21 210 lb 1.6 oz (95.3 kg)    Physical Exam Vitals reviewed.  Constitutional:      General: She is not in acute distress.    Appearance: Normal appearance.  HENT:     Head: Normocephalic and atraumatic.     Right Ear: External ear normal.     Left Ear: External ear normal.  Eyes:     General: No scleral icterus.       Right eye: No discharge.        Left eye: No discharge.     Conjunctiva/sclera: Conjunctivae normal.  Neck:     Thyroid: No thyromegaly.  Cardiovascular:     Rate and Rhythm: Normal rate and regular rhythm.  Pulmonary:     Effort: No respiratory distress.     Breath sounds: Normal breath sounds. No wheezing.  Abdominal:      General: Bowel sounds are normal.     Palpations: Abdomen is soft.     Tenderness: There is no abdominal tenderness.  Musculoskeletal:        General: No tenderness.     Cervical back: Neck supple. No tenderness.     Comments: No increased swelling.  Lymphadenopathy:     Cervical: No cervical adenopathy.  Skin:    Findings: No erythema or rash.  Neurological:     Mental Status: She is alert.  Psychiatric:        Mood and Affect: Mood normal.        Behavior: Behavior normal.     Outpatient Encounter Medications as of 03/30/2021  Medication Sig   acetaminophen (TYLENOL) 325 MG tablet Take 650 mg by mouth 4 (four) times daily as needed.   albuterol (VENTOLIN HFA) 108 (90 Base) MCG/ACT inhaler Inhale 2 puffs into the lungs every 6 (six) hours as needed for wheezing or shortness of breath.   apixaban (ELIQUIS) 5 MG TABS tablet Take 1 tablet (5 mg total) by mouth 2 (two) times daily.   aspirin EC 81 MG tablet Take 81 mg by mouth daily.   cetirizine (ZYRTEC) 10 MG tablet Take 10 mg by mouth daily.   diltiazem (CARDIZEM CD) 240 MG 24 hr capsule Take 1 capsule (240 mg total) by mouth daily.   ferrous sulfate 325 (65 FE) MG tablet Take 325 mg by mouth daily with breakfast.   levothyroxine (SYNTHROID) 75 MCG tablet Take 1 tablet (75 mcg total) by mouth daily at 6 (six) AM.   lovastatin (MEVACOR) 40 MG tablet TAKE 1 TABLET AT BEDTIME   magnesium oxide (MAG-OX) 400 MG tablet Take 1 tablet (400 mg total) by mouth 2 (two) times daily.   metoprolol tartrate (LOPRESSOR) 100 MG tablet Take 1 tablet (100 mg total) by mouth 2 (two) times daily.   Multiple Vitamin (MULTIVITAMIN) tablet Take 1 tablet by mouth daily.   omeprazole (PRILOSEC) 20 MG capsule TAKE 1 CAPSULE TWICE DAILY BEFORE MEALS   sertraline (ZOLOFT) 50 MG tablet TAKE 1/2 TABLET DAILY FOR 1 WEEK, THEN TAKE 1 TABLET DAILY (Patient taking differently: Take 50 mg by mouth at bedtime.)   torsemide (DEMADEX) 20 MG tablet Take 1 tablet (20 mg  total) by mouth daily.   vitamin B-12 (CYANOCOBALAMIN) 1000 MCG tablet Take 1 tablet by mouth daily.   No facility-administered encounter medications on file as of 03/30/2021.     Lab Results  Component Value Date   WBC 11.4 (H) 03/30/2021   HGB 10.4 (L) 03/30/2021   HCT 32.2 (L) 03/30/2021   PLT 316.0 03/30/2021   GLUCOSE 105 (H) 03/30/2021   CHOL 192 01/12/2021   TRIG 105.0 01/12/2021   HDL 77.10 01/12/2021   LDLDIRECT 131.0 03/19/2013   LDLCALC 94 01/12/2021   ALT 15 03/13/2021   AST 16 03/13/2021   NA 142 03/30/2021   K 3.7 03/30/2021   CL 96 03/30/2021   CREATININE 1.39 (H) 03/30/2021   BUN 28 (H) 03/30/2021   CO2 37 (H) 03/30/2021   TSH 6.280 (H) 03/14/2021   INR 1.2 03/12/2021   HGBA1C 5.9 01/12/2021       Assessment & Plan:   Problem List Items Addressed This Visit     Anemia    Recent admission for GI bleed as outlined.  No further bleeding since discharge.  EGD as outlined.  Schedule f/u with GI.  On eliquis.  Recheck cbc.        Relevant Orders   CBC with Differential/Platelet (Completed)   Ferritin (Completed)   Atrial fibrillation (HCC)    Atrial fib noted in hospital.  Had f/u with cardiology - cardizem increased to 240mg  q day.  Off digoxin.  Rated controlled.  On eliquis.  Follow.  Cameron ulcer, chronic    Found on EGD.  PPI bid.  Arrange f/u with GI.       Hiatal hernia with GERD and esophagitis    Found to have large hiatal hernia and ulcer with no active bleeding.  Saw GI.  Recommended PPI bid.  Discussed.        Hypercholesterolemia    Continue lovastatin.  Low cholesterol diet and exercise.  Follow lipid panel and liver function tests.       Hypertension - Primary    Currently on cardizem and lopressor.  Torsemide was added prior to discharge.  Blood pressure controlled.  Check metabolic panel.        Relevant Orders   Basic metabolic panel (Completed)   Hypothyroidism    Synthroid just adjusted in hospital.  Continue  synthroid.  Recheck tsh.       Iron deficiency anemia    S/p venofer 03/15/21.  Follow cbc.        SOB (shortness of breath) on exertion    Still with persistent sob with exertion. Stable.  Saw cardiology.  Adjusted cardizem for better heart rate control.  hgb improving.  No evidence of volume overload.        Stress    Continue zoloft.          Einar Pheasant, MD

## 2021-03-31 DIAGNOSIS — I4891 Unspecified atrial fibrillation: Secondary | ICD-10-CM | POA: Insufficient documentation

## 2021-03-31 NOTE — Assessment & Plan Note (Signed)
Continue lovastatin.  Low cholesterol diet and exercise.  Follow lipid panel and liver function tests.  

## 2021-03-31 NOTE — Assessment & Plan Note (Signed)
Still with persistent sob with exertion. Stable.  Saw cardiology.  Adjusted cardizem for better heart rate control.  hgb improving.  No evidence of volume overload.

## 2021-03-31 NOTE — Assessment & Plan Note (Signed)
Recent admission for GI bleed as outlined.  No further bleeding since discharge.  EGD as outlined.  Schedule f/u with GI.  On eliquis.  Recheck cbc.

## 2021-03-31 NOTE — Assessment & Plan Note (Signed)
S/p venofer 03/15/21.  Follow cbc.

## 2021-03-31 NOTE — Assessment & Plan Note (Addendum)
Found on EGD.  PPI bid.  Arrange f/u with GI.

## 2021-03-31 NOTE — Assessment & Plan Note (Signed)
Synthroid just adjusted in hospital.  Continue synthroid.  Recheck tsh.

## 2021-03-31 NOTE — Assessment & Plan Note (Signed)
Atrial fib noted in hospital.  Had f/u with cardiology - cardizem increased to 240mg  q day.  Off digoxin.  Rated controlled.  On eliquis.  Follow.

## 2021-03-31 NOTE — Assessment & Plan Note (Signed)
-

## 2021-03-31 NOTE — Assessment & Plan Note (Signed)
Found to have large hiatal hernia and ulcer with no active bleeding.  Saw GI.  Recommended PPI bid.  Discussed.

## 2021-03-31 NOTE — Assessment & Plan Note (Signed)
Currently on cardizem and lopressor.  Torsemide was added prior to discharge.  Blood pressure controlled.  Check metabolic panel.

## 2021-04-01 ENCOUNTER — Telehealth: Payer: Self-pay | Admitting: Cardiology

## 2021-04-01 DIAGNOSIS — I5033 Acute on chronic diastolic (congestive) heart failure: Secondary | ICD-10-CM

## 2021-04-01 NOTE — Telephone Encounter (Signed)
Noted  

## 2021-04-01 NOTE — Telephone Encounter (Signed)
Amanda Browning from Dr Bary Leriche office calling Wants to add BMP order to labs we are drawing 12/16 Please call Dr Bary Leriche office if any questions

## 2021-04-01 NOTE — Telephone Encounter (Signed)
BMP added under Dr. Bary Leriche name for lab draw on 04/02/21 in our office.

## 2021-04-02 ENCOUNTER — Other Ambulatory Visit (INDEPENDENT_AMBULATORY_CARE_PROVIDER_SITE_OTHER): Payer: Medicare HMO

## 2021-04-02 ENCOUNTER — Other Ambulatory Visit: Payer: Self-pay

## 2021-04-02 ENCOUNTER — Other Ambulatory Visit: Payer: Medicare HMO

## 2021-04-02 ENCOUNTER — Ambulatory Visit: Payer: Medicare HMO | Admitting: Cardiovascular Disease

## 2021-04-02 DIAGNOSIS — I5033 Acute on chronic diastolic (congestive) heart failure: Secondary | ICD-10-CM

## 2021-04-02 DIAGNOSIS — I4819 Other persistent atrial fibrillation: Secondary | ICD-10-CM

## 2021-04-03 ENCOUNTER — Telehealth: Payer: Self-pay | Admitting: Internal Medicine

## 2021-04-03 LAB — BASIC METABOLIC PANEL
BUN/Creatinine Ratio: 20 (ref 12–28)
BUN: 24 mg/dL (ref 8–27)
CO2: 27 mmol/L (ref 20–29)
Calcium: 9.5 mg/dL (ref 8.7–10.3)
Chloride: 98 mmol/L (ref 96–106)
Creatinine, Ser: 1.23 mg/dL — ABNORMAL HIGH (ref 0.57–1.00)
Glucose: 112 mg/dL — ABNORMAL HIGH (ref 70–99)
Potassium: 3.7 mmol/L (ref 3.5–5.2)
Sodium: 144 mmol/L (ref 134–144)
eGFR: 46 mL/min/{1.73_m2} — ABNORMAL LOW (ref >59)

## 2021-04-03 LAB — HEMOGLOBIN AND HEMATOCRIT, BLOOD
Hematocrit: 32.4 % — ABNORMAL LOW (ref 34.0–46.6)
Hemoglobin: 10.3 g/dL — ABNORMAL LOW (ref 11.1–15.9)

## 2021-04-03 NOTE — Telephone Encounter (Signed)
-----  Message from Kate Sable, MD sent at 04/01/2021  4:46 PM EST ----- Regarding: RE: follow up Yes the change made is appropriate and I agree with it.  Patient plans to follow-up with Dr.Gollan from a cardiac perspective, I will CC him here for additional input and FYI.  Thank you BA ----- Message ----- From: Einar Pheasant, MD Sent: 03/31/2021  12:45 PM EST To: Kate Sable, MD Subject: follow up                                      I saw Amanda Browning in follow up.  She was recently admitted for GI bleed.  Diagnosed with afib during her hospitalization.  She was placed on cardizem, metoprolol and digoxin.  It appears just prior to discharge, she was started on torsemide.  You saw her 12/9.  Increased her cardizem.  Stopped her digoxin.  I saw her yesterday 03/30/21.  Heart rate and blood pressure ok.  Met b revealed GFR 37 (Creatinine 1.39) previous GFR >60 with creatinine .8.  I informed her to hold her torsemide and weigh herself daily.  If gains more than 3 pounds in one day or 5 pounds in one week - take torsemidde prn (weight gain).  I am going to get f/u labs regarding her kidney function (soon).  I wanted to make sure you were ok with this change and if there is anything more you need me to do, let me know.  Thank you again for seeing her and for your time and advice.    Amanda Browning

## 2021-04-05 ENCOUNTER — Telehealth: Payer: Self-pay | Admitting: Cardiovascular Disease

## 2021-04-05 NOTE — Telephone Encounter (Signed)
Was able to reach back out to Amanda Browning regarding her 9 lbs weight gain. She reports stopped her torsemide 12/16 d/t Dr. Lars Mage, recommendations, since then she has "put on the lbs and and very SOB, I can't even take a few steps w/o getting winded".   Dr. Nicki Reaper OV notes 12/17  I informed her to hold her torsemide and weigh herself daily.  If gains more than 3 pounds in one day or 5 pounds in one week - take torsemidde prn (weight gain).  I am going to get f/u labs regarding her kidney function (soon).  Advised pt d/t circumstance try to add torsemide 20 daily for a few days and extra 1/2 tab -full tab around 2 pm as needed since she has not taken any torsemide in 4 days to see if she can push some fluids off and see if her SOB to improve as she feels like most fluid build-up "all in my lungs & chest", pt denies swelling in her ankles or lower legs at current "nothing new" per pt. After a few days, go back to PRN per Dr. Nicki Reaper advice, if not better then may need to seek the ED as there is not openings at this time for an evaluation.   Amanda Browning has an appt with Dr. Rockey Situ 01/09 at 4:20 PM. In the meantime I have asked her to: - decrease her salt intake  -watch for hidden salt  No outside home foods, chicken high in sodium No process/can foods - weight herself daily in the morning after getting up and voiding and record these weights  Advised if symptoms gets worse, SOB becomes worse, not able to push fluids off, develops CP or other symptoms then please seek the ED for further evaluation, may need IV lasix to help push fluids off. Amanda Browning verbalized understanding.  Otherwise all questions were address and no additional concerns at this time. Amanda Browning thankful for the return call and advice. Agreeable to plan, will call back for anything further.

## 2021-04-05 NOTE — Telephone Encounter (Signed)
Pt c/o swelling: STAT is pt has developed SOB within 24 hours  If swelling, where is the swelling located? In stomach and up  How much weight have you gained and in what time span? 9 pounds in 2 weeks  Have you gained 3 pounds in a day or 5 pounds in a week? 1 pound of fluid everyday   Do you have a log of your daily weights (if so, list)?   Are you currently taking a fluid pill? No - they stopped pill in hospital   Are you currently SOB? Yes - always   Have you traveled recently? no

## 2021-04-13 ENCOUNTER — Telehealth: Payer: Self-pay | Admitting: *Deleted

## 2021-04-13 DIAGNOSIS — R7989 Other specified abnormal findings of blood chemistry: Secondary | ICD-10-CM | POA: Insufficient documentation

## 2021-04-13 DIAGNOSIS — I10 Essential (primary) hypertension: Secondary | ICD-10-CM

## 2021-04-13 NOTE — Telephone Encounter (Signed)
Please see note from cardiology in her chart. Appears they have already handled this but wanted you to review to confirm nothing further need from [place].

## 2021-04-13 NOTE — Telephone Encounter (Signed)
Called and spoke to [name] (Ms Dodge's daughter).  Discussed torsemide, breathing and daily weights.  Will come in tomorrow for f/u lab - met b.  Has been taking torsemide daily.  (Took two tablets yesterday).  Discussed other possible etiologies for her sob.  Will come tomorrow for lab.

## 2021-04-13 NOTE — Telephone Encounter (Signed)
Orders placed.

## 2021-04-13 NOTE — Telephone Encounter (Signed)
Sending back to you for documentation. Notified patient of below but wanted me to call her daughter for more info since she was the one speaking with cardiology

## 2021-04-13 NOTE — Telephone Encounter (Signed)
Was able to return call to pt's daughter, advised that PCP reduce Amanda Browning's torsemide d/t elevated creatine levels. Educated pt's daughter that taking torsemide daily can increase those levels and make worse if already elevated, making her kidneys dry and can cause renal impairments. Daughter verbalized understanding.   Daughter advised when pt takes torsemide she looses about a pound in weight and her SOB is better, when not taking torsemide on days her SOB is increased and she will gain a pound or two in a day.   Advised cannot make recommendations w/o providers consent, also Dr. Rockey Situ is out this week, will return the following week, pt has an appt already established on 1/09, to please keep that appt. Suggest ED visit if her weight is putting on and her SOB is getting worse, there they can establish a CXR to determine if fluids build up in lungs, repeat Amanda Browning's renal function, possible BNP for fluid volume overload, and advise on IV lasix or other forms of treatments.   Pt is reluctant to seek ED in area d/t past experience, suggest ED at [facility] if her symptoms gets worse. Daughter verbalized understanding, thankful for the return call,  will have Amanda Browning continue torsemide sparingly and seek ED if symptoms worsened, otherwise will see Dr. Rockey Situ on 1/9 for further recommendations.   Pt is on wait list for sooner appt if available.  Appt note for repeat BMP, possible need for BNP

## 2021-04-13 NOTE — Telephone Encounter (Signed)
Please place future orders for lab appt.  

## 2021-04-13 NOTE — Telephone Encounter (Signed)
Reviewed notes.  Please call Ms Sano and confirm what questions she has currently.  It appears that cardiology has been talking with her as well.

## 2021-04-13 NOTE — Telephone Encounter (Signed)
Order placed for met b and tsh lab

## 2021-04-13 NOTE — Telephone Encounter (Signed)
Patient daughter calling  They would like to clarify if she can take lasix everyday  Her PCP keeps taking her off but patient gains fluid if she does not take Patient is also still SOB Please call to discuss

## 2021-04-13 NOTE — Telephone Encounter (Signed)
Pt was seen 12/13 by PCP for hosp f/u.  Pt is currently on a fluid pill. Pt has some questions regarding this pill and wants to know exactly what she should be doing. Pls call pt back at [phone]

## 2021-04-14 ENCOUNTER — Other Ambulatory Visit (INDEPENDENT_AMBULATORY_CARE_PROVIDER_SITE_OTHER): Payer: Medicare HMO

## 2021-04-14 ENCOUNTER — Other Ambulatory Visit: Payer: Self-pay

## 2021-04-14 DIAGNOSIS — I10 Essential (primary) hypertension: Secondary | ICD-10-CM | POA: Diagnosis not present

## 2021-04-14 DIAGNOSIS — R7989 Other specified abnormal findings of blood chemistry: Secondary | ICD-10-CM | POA: Diagnosis not present

## 2021-04-14 LAB — BASIC METABOLIC PANEL
BUN: 19 mg/dL (ref 6–23)
CO2: 33 mEq/L — ABNORMAL HIGH (ref 19–32)
Calcium: 9.1 mg/dL (ref 8.4–10.5)
Chloride: 96 mEq/L (ref 96–112)
Creatinine, Ser: 1.33 mg/dL — ABNORMAL HIGH (ref 0.40–1.20)
GFR: 39.2 mL/min — ABNORMAL LOW (ref >60.00)
Glucose, Bld: 125 mg/dL — ABNORMAL HIGH (ref 70–99)
Potassium: 3.6 mEq/L (ref 3.5–5.1)
Sodium: 140 mEq/L (ref 135–145)

## 2021-04-14 LAB — TSH: TSH: 3.68 u[IU]/mL (ref 0.35–5.50)

## 2021-04-15 ENCOUNTER — Other Ambulatory Visit: Payer: Self-pay | Admitting: Internal Medicine

## 2021-04-15 DIAGNOSIS — H524 Presbyopia: Secondary | ICD-10-CM | POA: Diagnosis not present

## 2021-04-15 DIAGNOSIS — H2513 Age-related nuclear cataract, bilateral: Secondary | ICD-10-CM | POA: Diagnosis not present

## 2021-04-15 DIAGNOSIS — H5203 Hypermetropia, bilateral: Secondary | ICD-10-CM | POA: Diagnosis not present

## 2021-04-15 DIAGNOSIS — H52223 Regular astigmatism, bilateral: Secondary | ICD-10-CM | POA: Diagnosis not present

## 2021-04-15 DIAGNOSIS — H43813 Vitreous degeneration, bilateral: Secondary | ICD-10-CM | POA: Diagnosis not present

## 2021-04-15 MED ORDER — TORSEMIDE 20 MG PO TABS: 20.0000 mg | ORAL_TABLET | Freq: Every day | ORAL | 0 refills | Status: DC

## 2021-04-15 NOTE — Progress Notes (Signed)
Rx ok'd for torsemide refill #30 with no refills.

## 2021-04-16 ENCOUNTER — Ambulatory Visit
Admission: RE | Admit: 2021-04-16 | Discharge: 2021-04-16 | Disposition: A | Discharge status: Home / Self Care | Payer: Medicare HMO | Source: Ambulatory Visit | Attending: Internal Medicine | Admitting: Internal Medicine

## 2021-04-16 ENCOUNTER — Other Ambulatory Visit: Payer: Self-pay

## 2021-04-16 DIAGNOSIS — Z1231 Encounter for screening mammogram for malignant neoplasm of breast: Secondary | ICD-10-CM | POA: Diagnosis not present

## 2021-04-16 DIAGNOSIS — R944 Abnormal results of kidney function studies: Secondary | ICD-10-CM

## 2021-04-19 DIAGNOSIS — I509 Heart failure, unspecified: Secondary | ICD-10-CM | POA: Diagnosis not present

## 2021-04-19 DIAGNOSIS — I2699 Other pulmonary embolism without acute cor pulmonale: Secondary | ICD-10-CM | POA: Diagnosis not present

## 2021-04-19 DIAGNOSIS — I4891 Unspecified atrial fibrillation: Secondary | ICD-10-CM | POA: Diagnosis not present

## 2021-04-19 DIAGNOSIS — J90 Pleural effusion, not elsewhere classified: Secondary | ICD-10-CM | POA: Diagnosis not present

## 2021-04-19 DIAGNOSIS — I4819 Other persistent atrial fibrillation: Secondary | ICD-10-CM | POA: Diagnosis not present

## 2021-04-19 DIAGNOSIS — J810 Acute pulmonary edema: Secondary | ICD-10-CM | POA: Diagnosis not present

## 2021-04-19 DIAGNOSIS — Z20822 Contact with and (suspected) exposure to covid-19: Secondary | ICD-10-CM | POA: Diagnosis not present

## 2021-04-19 DIAGNOSIS — R000 Tachycardia, unspecified: Secondary | ICD-10-CM | POA: Diagnosis not present

## 2021-04-19 DIAGNOSIS — R0600 Dyspnea, unspecified: Secondary | ICD-10-CM | POA: Diagnosis not present

## 2021-04-19 DIAGNOSIS — I5030 Unspecified diastolic (congestive) heart failure: Secondary | ICD-10-CM | POA: Diagnosis not present

## 2021-04-19 DIAGNOSIS — D509 Iron deficiency anemia, unspecified: Secondary | ICD-10-CM | POA: Diagnosis not present

## 2021-04-19 DIAGNOSIS — I5033 Acute on chronic diastolic (congestive) heart failure: Secondary | ICD-10-CM | POA: Diagnosis not present

## 2021-04-19 DIAGNOSIS — D649 Anemia, unspecified: Secondary | ICD-10-CM | POA: Diagnosis not present

## 2021-04-19 DIAGNOSIS — E785 Hyperlipidemia, unspecified: Secondary | ICD-10-CM | POA: Diagnosis not present

## 2021-04-19 DIAGNOSIS — R0602 Shortness of breath: Secondary | ICD-10-CM | POA: Diagnosis not present

## 2021-04-19 DIAGNOSIS — N179 Acute kidney failure, unspecified: Secondary | ICD-10-CM | POA: Diagnosis not present

## 2021-04-19 DIAGNOSIS — E039 Hypothyroidism, unspecified: Secondary | ICD-10-CM | POA: Diagnosis not present

## 2021-04-19 DIAGNOSIS — F419 Anxiety disorder, unspecified: Secondary | ICD-10-CM | POA: Diagnosis not present

## 2021-04-19 DIAGNOSIS — I10 Essential (primary) hypertension: Secondary | ICD-10-CM | POA: Diagnosis not present

## 2021-04-19 DIAGNOSIS — I5043 Acute on chronic combined systolic (congestive) and diastolic (congestive) heart failure: Secondary | ICD-10-CM | POA: Diagnosis not present

## 2021-04-19 DIAGNOSIS — I428 Other cardiomyopathies: Secondary | ICD-10-CM | POA: Diagnosis not present

## 2021-04-20 ENCOUNTER — Other Ambulatory Visit: Payer: Medicare HMO

## 2021-04-20 ENCOUNTER — Telehealth: Payer: Self-pay

## 2021-04-20 DIAGNOSIS — I5033 Acute on chronic diastolic (congestive) heart failure: Secondary | ICD-10-CM | POA: Diagnosis not present

## 2021-04-20 DIAGNOSIS — N179 Acute kidney failure, unspecified: Secondary | ICD-10-CM | POA: Diagnosis not present

## 2021-04-20 DIAGNOSIS — R000 Tachycardia, unspecified: Secondary | ICD-10-CM | POA: Diagnosis not present

## 2021-04-20 DIAGNOSIS — I4891 Unspecified atrial fibrillation: Secondary | ICD-10-CM | POA: Diagnosis not present

## 2021-04-20 NOTE — Telephone Encounter (Signed)
Called and left a message for call back to make appointment. Sent mychart message

## 2021-04-20 NOTE — Telephone Encounter (Signed)
-----   Message from Eliseo Squires, [place] sent at 04/20/2021  8:44 AM EST ----- Regarding: FW: referral for hiatal hernia  ----- Message ----- From: Lin Landsman, MD Sent: 04/20/2021   8:38 AM EST To: Einar Pheasant, MD, # Subject: RE: referral for hiatal hernia                 Yes, she needs follow up with me  Hope, please schedule a follow up appt with me in next 2-3 weeks  Thanks RV   ----- Message ----- From: Einar Pheasant, MD Sent: 04/14/2021   3:22 PM EST To: Lin Landsman, MD Subject: referral for hiatal hernia                     You recently saw Amanda Browning in the hospital for GI bleed.  S/p EGD that revealed large hiatal hernia (cameron ulcers).  Per your procedure note, you had recommended a referral to CT surgery.  [name]'s daughter [name]) called with questions about a follow up regarding the hernia.  Do you have a preference of where to send her for evaluation?  Also, per hospital note, she needs a f/u with you.  We can call and schedule her for f/u if you desire.  Thank you for your help with [name].  I appreciate it.    Einar Pheasant

## 2021-04-21 DIAGNOSIS — I2699 Other pulmonary embolism without acute cor pulmonale: Secondary | ICD-10-CM | POA: Diagnosis not present

## 2021-04-21 DIAGNOSIS — I4891 Unspecified atrial fibrillation: Secondary | ICD-10-CM | POA: Diagnosis not present

## 2021-04-21 DIAGNOSIS — N179 Acute kidney failure, unspecified: Secondary | ICD-10-CM | POA: Diagnosis not present

## 2021-04-21 DIAGNOSIS — I10 Essential (primary) hypertension: Secondary | ICD-10-CM | POA: Diagnosis not present

## 2021-04-21 DIAGNOSIS — I5033 Acute on chronic diastolic (congestive) heart failure: Secondary | ICD-10-CM | POA: Diagnosis not present

## 2021-04-22 ENCOUNTER — Telehealth: Payer: Self-pay | Admitting: Internal Medicine

## 2021-04-22 DIAGNOSIS — D649 Anemia, unspecified: Secondary | ICD-10-CM | POA: Diagnosis not present

## 2021-04-22 DIAGNOSIS — I10 Essential (primary) hypertension: Secondary | ICD-10-CM | POA: Diagnosis not present

## 2021-04-22 DIAGNOSIS — N179 Acute kidney failure, unspecified: Secondary | ICD-10-CM | POA: Diagnosis not present

## 2021-04-22 DIAGNOSIS — I4891 Unspecified atrial fibrillation: Secondary | ICD-10-CM | POA: Diagnosis not present

## 2021-04-22 DIAGNOSIS — I2699 Other pulmonary embolism without acute cor pulmonale: Secondary | ICD-10-CM | POA: Diagnosis not present

## 2021-04-22 DIAGNOSIS — J90 Pleural effusion, not elsewhere classified: Secondary | ICD-10-CM | POA: Diagnosis not present

## 2021-04-22 DIAGNOSIS — I5033 Acute on chronic diastolic (congestive) heart failure: Secondary | ICD-10-CM | POA: Diagnosis not present

## 2021-04-22 NOTE — Telephone Encounter (Signed)
Patient Daughter [name] called in for Larena Glassman stated patient still in hospital since [facility] . Please call [name] @ [phone]

## 2021-04-22 NOTE — Telephone Encounter (Signed)
LM for Amanda Browning

## 2021-04-23 ENCOUNTER — Telehealth: Payer: Self-pay

## 2021-04-23 DIAGNOSIS — I2699 Other pulmonary embolism without acute cor pulmonale: Secondary | ICD-10-CM | POA: Diagnosis not present

## 2021-04-23 DIAGNOSIS — I5033 Acute on chronic diastolic (congestive) heart failure: Secondary | ICD-10-CM | POA: Diagnosis not present

## 2021-04-23 DIAGNOSIS — I10 Essential (primary) hypertension: Secondary | ICD-10-CM | POA: Diagnosis not present

## 2021-04-23 DIAGNOSIS — I4891 Unspecified atrial fibrillation: Secondary | ICD-10-CM | POA: Diagnosis not present

## 2021-04-23 DIAGNOSIS — N179 Acute kidney failure, unspecified: Secondary | ICD-10-CM | POA: Diagnosis not present

## 2021-04-23 NOTE — Telephone Encounter (Signed)
Called and spoke to patients daughter however I was unable to ask questions due to no DPR on file.

## 2021-04-25 DIAGNOSIS — I5033 Acute on chronic diastolic (congestive) heart failure: Secondary | ICD-10-CM | POA: Diagnosis not present

## 2021-04-25 NOTE — H&P (View-Only) (Signed)
Cardiology Office Note  Date:  04/26/2021   ID:  Amanda Browning 05-07-1945, MRN [id number]  PCP:  Amanda Pheasant, MD   Chief Complaint  Patient presents with   Follow up CHF    Patient was at [facility] ER. Patient c/o shortness of breath and weight gain. Medications reviewed by the patient verbally.     HPI:  Amanda Browning is a 76 y.o. female with a hx of hypertension, hyperlipidemia  Admission to [place] 11/22 for GI bleed,  A. fib RVR , acute diastolic CHF Started on Eliquis March 26, 2021 Torsemide dose decreased for renal dysfunction Admission to [facility] diastolic CHF requiring IV Lasix, discharged on torsemide 40 daily, BNP 411, had 11 L diuresis 15 pounds down Baseline weight 205 pounds Echocardiogram on 04/21/21 showed new RV dilation and systolic dysfunction. CTA showed acute right lower lobe pulmonary embolism. Who presents for routine follow-up of her diastolic CHF  Prior records from November 2022 reviewed GI bleeding 4 days prior to admission symptoms of hypotensive, weakness Hemoglobin 7.2 on arrival, was transfused EGD November 28, ulcers noted, no active bleeding, started on PPI twice daily -Eliquis on hold for now, plan on 2-week hold before starting (earlier if permitted by GI)  Atrial fibrillation with RVR Initially started on diltiazem infusion, was transitioned to oral Cardizem and metoprolol -Challenging rate control Yesterday started on digoxin load, will continue 0.125 daily -Increase diltiazem up to diltiazem extended release 180 Continue metoprolol  Acute diastolic CHF In the setting of atrial fibrillation with RVR, GI bleed -Moderately elevated right heart pressures on echocardiogram Started on IV Lasix 40 daily with potassium yesterday, good urine output, still with abdominal bloating, shortness of breath on exertion -Plan to continue Lasix today with close monitoring of renal function  In hospital [facility] [place] Records  reviewed Currently tolerating Aldactone, Metoprolol BID, Torsemide 40 daily Eliquis , been on it 4 weeks  Lab work reviewed HGB 12.9  Reports feeling 75% for baseline, still mild shortness of breath, she does appreciate atrial fibrillation  EKG personally reviewed by myself on todays visit Atrial fibrillation ventricular rate 83 bpm no significant ST-T wave changes   PMH:   has a past medical history of Anemia, Chicken pox, Colon polyps, Complication of anesthesia, DDD (degenerative disc disease), cervical (02/05/13), GERD (gastroesophageal reflux disease), History of chicken pox, Hypercholesterolemia, Hyperlipidemia, Hypertension, Hypothyroidism, Migraines, PONV (postoperative nausea and vomiting), and Squamous cell skin cancer.  PSH:    Past Surgical History:  Procedure Laterality Date   BREAST BIOPSY Right 2012   stereotactic   COLONOSCOPY WITH PROPOFOL N/A 02/23/2018   Procedure: COLONOSCOPY WITH PROPOFOL;  Surgeon: Manya Silvas, MD;  Location: [facility] ENDOSCOPY;  Service: Endoscopy;  Laterality: N/A;   ESOPHAGOGASTRODUODENOSCOPY N/A 03/15/2021   Procedure: ESOPHAGOGASTRODUODENOSCOPY (EGD);  Surgeon: Lin Landsman, MD;  Location: [facility] ENDOSCOPY;  Service: Gastroenterology;  Laterality: N/A;   KNEE ARTHROPLASTY Right 04/17/2017   Procedure: COMPUTER ASSISTED TOTAL KNEE ARTHROPLASTY;  Surgeon: Dereck Leep, MD;  Location: [facility] ORS;  Service: Orthopedics;  Laterality: Right;   SKIN BIOPSY     VAGINAL HYSTERECTOMY  1994   ovaries not removed   WISDOM TOOTH EXTRACTION      Current Outpatient Medications  Medication Sig Dispense Refill   acetaminophen (TYLENOL) 325 MG tablet Take 650 mg by mouth 4 (four) times daily as needed.     apixaban (ELIQUIS) 5 MG TABS tablet Take 1 tablet (5 mg total) by mouth 2 (two) times daily.  60 tablet 3   cetirizine (ZYRTEC) 10 MG tablet Take 10 mg by mouth daily.     diltiazem (CARDIZEM CD) 240 MG 24 hr capsule Take 1 capsule (240 mg  total) by mouth daily. 30 capsule 3   ferrous sulfate 325 (65 FE) MG tablet Take 325 mg by mouth daily with breakfast.     levothyroxine (SYNTHROID) 75 MCG tablet Take 1 tablet (75 mcg total) by mouth daily at 6 (six) AM. 90 tablet 0   losartan (COZAAR) 25 MG tablet Take 25 mg by mouth daily.     magnesium oxide (MAG-OX) 400 MG tablet Take 1 tablet (400 mg total) by mouth 2 (two) times daily. 60 tablet 2   metoprolol tartrate (LOPRESSOR) 100 MG tablet Take 1 tablet (100 mg total) by mouth 2 (two) times daily. 60 tablet 1   Multiple Vitamin (MULTIVITAMIN) tablet Take 1 tablet by mouth daily.     omeprazole (PRILOSEC) 20 MG capsule TAKE 1 CAPSULE TWICE DAILY BEFORE MEALS 180 capsule 1   potassium chloride (KLOR-CON) 10 MEQ tablet Take 1 tablet (10 mEq total) by mouth daily. 30 tablet 3   rosuvastatin (CRESTOR) 5 MG tablet Take 5 mg by mouth daily.     sertraline (ZOLOFT) 50 MG tablet TAKE 1/2 TABLET DAILY FOR 1 WEEK, THEN TAKE 1 TABLET DAILY (Patient taking differently: Take 50 mg by mouth at bedtime.) 90 tablet 1   spironolactone (ALDACTONE) 25 MG tablet Take 25 mg by mouth daily.     Torsemide 40 MG TABS Take 40 mg by mouth daily.     No current facility-administered medications for this visit.     Allergies:   Codeine   Social History:  The patient  reports that she has never smoked. She has never used smokeless tobacco. She reports that she does not drink alcohol and does not use drugs.   Family History:   family history includes Breast cancer (age of onset: 42) in her sister; Cancer in her sister; Diabetes type II in her brother; Heart attack in her father and mother; Heart disease in her father and mother; Hyperlipidemia in her father; Hypertension in her brother, father, and mother; Ovarian cancer in her paternal aunt; Stroke in her father and mother.    Review of Systems: Review of Systems  Constitutional: Negative.   HENT: Negative.    Respiratory: Negative.    Cardiovascular:  Negative.   Gastrointestinal: Negative.   Musculoskeletal: Negative.   Neurological: Negative.   Psychiatric/Behavioral: Negative.    All other systems reviewed and are negative.   PHYSICAL EXAM: VS:  BP 100/60 (BP Location: Left Arm, Patient Position: Sitting, Cuff Size: Large)    Pulse 83    Ht 5\' 3"  (1.6 m)    Wt 208 lb 4 oz (94.5 kg)    SpO2 97%    BMI 36.89 kg/m  , BMI Body mass index is 36.89 kg/m. GEN: Well nourished, well developed, in no acute distress HEENT: normal Neck: no JVD, carotid bruits, or masses Cardiac: Irregularly irregular; no murmurs, rubs, or gallops,no edema  Respiratory:  clear to auscultation bilaterally, normal work of breathing GI: soft, nontender, nondistended, + BS MS: no deformity or atrophy Skin: warm and dry, no rash Neuro:  Strength and sensation are intact Psych: euthymic mood, full affect  Recent Labs: 03/13/2021: ALT 15 03/19/2021: Magnesium 2.0 03/30/2021: Platelets 316.0 04/02/2021: Hemoglobin 10.3 04/14/2021: BUN 19; Creatinine, Ser 1.33; Potassium 3.6; Sodium 140; TSH 3.68    Lipid Panel Lab  Results  Component Value Date   CHOL 192 01/12/2021   HDL 77.10 01/12/2021   LDLCALC 94 01/12/2021   TRIG 105.0 01/12/2021      Wt Readings from Last 3 Encounters:  04/26/21 208 lb 4 oz (94.5 kg)  04/26/21 208 lb 12.8 oz (94.7 kg)  03/30/21 216 lb 12.8 oz (98.3 kg)       ASSESSMENT AND PLAN:  Problem List Items Addressed This Visit       Cardiology Problems   Acute on chronic diastolic CHF (congestive heart failure) (HCC) - Primary   Relevant Medications   spironolactone (ALDACTONE) 25 MG tablet   Torsemide 40 MG TABS   Other Relevant Orders   EKG 12-Lead   Pulmonary embolism (HCC)   Relevant Medications   spironolactone (ALDACTONE) 25 MG tablet   Torsemide 40 MG TABS   Other Relevant Orders   EKG 12-Lead   Atrial fibrillation (HCC)   Relevant Medications   spironolactone (ALDACTONE) 25 MG tablet   Torsemide 40 MG TABS    Other Relevant Orders   EKG 12-Lead   Persistent atrial fibrillation Rate controlled, no changes to medications, continue Eliquis Plan for cardioversion later this week or next week Has completed 4 weeks on Eliquis  Chronic diastolic CHF Recent hospital admission with significant diuresis Will continue torsemide 40 daily Recommended holding torsemide for any weight less than 205 for 1 day -Suspect her symptoms will improve with normal sinus rhythm has been restored Add potassium 10 daily Continue spironolactone, metoprolol  Essential hypertension Blood pressure running low 258N systolic, concern for prerenal state She will watch for orthostasis, recommend she call us for orthostasis symptoms, may need to hold torsemide for 1 day here and there    Total encounter time more than 35 minutes  Greater than 50% was spent in counseling and coordination of care with the patient    Signed, Esmond Plants, M.D., Ph.D. [place], [place]

## 2021-04-25 NOTE — Progress Notes (Signed)
Cardiology Office Note  Date:  04/26/2021   ID:  Amanda Browning [birth year], MRN [id number]  PCP:  Einar Pheasant, MD   Chief Complaint  Patient presents with   Follow up CHF    Patient was at [facility] ER. Patient c/o shortness of breath and weight gain. Medications reviewed by the patient verbally.     HPI:  Amanda Browning is a 76 y.o. female with a hx of hypertension, hyperlipidemia  Admission to [place] 11/22 for GI bleed,  A. fib RVR , acute diastolic CHF Started on Eliquis March 26, 2021 Torsemide dose decreased for renal dysfunction Admission to [facility] diastolic CHF requiring IV Lasix, discharged on torsemide 40 daily, BNP 411, had 11 L diuresis 15 pounds down Baseline weight 205 pounds Echocardiogram on 04/21/21 showed new RV dilation and systolic dysfunction. CTA showed acute right lower lobe pulmonary embolism. Who presents for routine follow-up of her diastolic CHF  Prior records from November 2022 reviewed GI bleeding 4 days prior to admission symptoms of hypotensive, weakness Hemoglobin 7.2 on arrival, was transfused EGD November 28, ulcers noted, no active bleeding, started on PPI twice daily -Eliquis on hold for now, plan on 2-week hold before starting (earlier if permitted by GI)  Atrial fibrillation with RVR Initially started on diltiazem infusion, was transitioned to oral Cardizem and metoprolol -Challenging rate control Yesterday started on digoxin load, will continue 0.125 daily -Increase diltiazem up to diltiazem extended release 180 Continue metoprolol  Acute diastolic CHF In the setting of atrial fibrillation with RVR, GI bleed -Moderately elevated right heart pressures on echocardiogram Started on IV Lasix 40 daily with potassium yesterday, good urine output, still with abdominal bloating, shortness of breath on exertion -Plan to continue Lasix today with close monitoring of renal function  In hospital [facility] [place] Records  reviewed Currently tolerating Aldactone, Metoprolol BID, Torsemide 40 daily Eliquis , been on it 4 weeks  Lab work reviewed HGB 12.9  Reports feeling 75% for baseline, still mild shortness of breath, she does appreciate atrial fibrillation  EKG personally reviewed by myself on todays visit Atrial fibrillation ventricular rate 83 bpm no significant ST-T wave changes   PMH:   has a past medical history of Anemia, Chicken pox, Colon polyps, Complication of anesthesia, DDD (degenerative disc disease), cervical (02/05/13), GERD (gastroesophageal reflux disease), History of chicken pox, Hypercholesterolemia, Hyperlipidemia, Hypertension, Hypothyroidism, Migraines, PONV (postoperative nausea and vomiting), and Squamous cell skin cancer.  PSH:    Past Surgical History:  Procedure Laterality Date   BREAST BIOPSY Right 2012   stereotactic   COLONOSCOPY WITH PROPOFOL N/A 02/23/2018   Procedure: COLONOSCOPY WITH PROPOFOL;  Surgeon: Manya Silvas, MD;  Location: [facility] ENDOSCOPY;  Service: Endoscopy;  Laterality: N/A;   ESOPHAGOGASTRODUODENOSCOPY N/A 03/15/2021   Procedure: ESOPHAGOGASTRODUODENOSCOPY (EGD);  Surgeon: Lin Landsman, MD;  Location: [facility] ENDOSCOPY;  Service: Gastroenterology;  Laterality: N/A;   KNEE ARTHROPLASTY Right 04/17/2017   Procedure: COMPUTER ASSISTED TOTAL KNEE ARTHROPLASTY;  Surgeon: Dereck Leep, MD;  Location: [facility] ORS;  Service: Orthopedics;  Laterality: Right;   SKIN BIOPSY     VAGINAL HYSTERECTOMY  1994   ovaries not removed   WISDOM TOOTH EXTRACTION      Current Outpatient Medications  Medication Sig Dispense Refill   acetaminophen (TYLENOL) 325 MG tablet Take 650 mg by mouth 4 (four) times daily as needed.     apixaban (ELIQUIS) 5 MG TABS tablet Take 1 tablet (5 mg total) by mouth 2 (two) times daily.  60 tablet 3   cetirizine (ZYRTEC) 10 MG tablet Take 10 mg by mouth daily.     diltiazem (CARDIZEM CD) 240 MG 24 hr capsule Take 1 capsule (240 mg  total) by mouth daily. 30 capsule 3   ferrous sulfate 325 (65 FE) MG tablet Take 325 mg by mouth daily with breakfast.     levothyroxine (SYNTHROID) 75 MCG tablet Take 1 tablet (75 mcg total) by mouth daily at 6 (six) AM. 90 tablet 0   losartan (COZAAR) 25 MG tablet Take 25 mg by mouth daily.     magnesium oxide (MAG-OX) 400 MG tablet Take 1 tablet (400 mg total) by mouth 2 (two) times daily. 60 tablet 2   metoprolol tartrate (LOPRESSOR) 100 MG tablet Take 1 tablet (100 mg total) by mouth 2 (two) times daily. 60 tablet 1   Multiple Vitamin (MULTIVITAMIN) tablet Take 1 tablet by mouth daily.     omeprazole (PRILOSEC) 20 MG capsule TAKE 1 CAPSULE TWICE DAILY BEFORE MEALS 180 capsule 1   potassium chloride (KLOR-CON) 10 MEQ tablet Take 1 tablet (10 mEq total) by mouth daily. 30 tablet 3   rosuvastatin (CRESTOR) 5 MG tablet Take 5 mg by mouth daily.     sertraline (ZOLOFT) 50 MG tablet TAKE 1/2 TABLET DAILY FOR 1 WEEK, THEN TAKE 1 TABLET DAILY (Patient taking differently: Take 50 mg by mouth at bedtime.) 90 tablet 1   spironolactone (ALDACTONE) 25 MG tablet Take 25 mg by mouth daily.     Torsemide 40 MG TABS Take 40 mg by mouth daily.     No current facility-administered medications for this visit.     Allergies:   Codeine   Social History:  The patient  reports that she has never smoked. She has never used smokeless tobacco. She reports that she does not drink alcohol and does not use drugs.   Family History:   family history includes Breast cancer (age of onset: 21) in her sister; Cancer in her sister; Diabetes type II in her brother; Heart attack in her father and mother; Heart disease in her father and mother; Hyperlipidemia in her father; Hypertension in her brother, father, and mother; Ovarian cancer in her paternal aunt; Stroke in her father and mother.    Review of Systems: Review of Systems  Constitutional: Negative.   HENT: Negative.    Respiratory: Negative.    Cardiovascular:  Negative.   Gastrointestinal: Negative.   Musculoskeletal: Negative.   Neurological: Negative.   Psychiatric/Behavioral: Negative.    All other systems reviewed and are negative.   PHYSICAL EXAM: VS:  BP 100/60 (BP Location: Left Arm, Patient Position: Sitting, Cuff Size: Large)    Pulse 83    Ht 5\' 3"  (1.6 m)    Wt 208 lb 4 oz (94.5 kg)    SpO2 97%    BMI 36.89 kg/m  , BMI Body mass index is 36.89 kg/m. GEN: Well nourished, well developed, in no acute distress HEENT: normal Neck: no JVD, carotid bruits, or masses Cardiac: Irregularly irregular; no murmurs, rubs, or gallops,no edema  Respiratory:  clear to auscultation bilaterally, normal work of breathing GI: soft, nontender, nondistended, + BS MS: no deformity or atrophy Skin: warm and dry, no rash Neuro:  Strength and sensation are intact Psych: euthymic mood, full affect  Recent Labs: 03/13/2021: ALT 15 03/19/2021: Magnesium 2.0 03/30/2021: Platelets 316.0 04/02/2021: Hemoglobin 10.3 04/14/2021: BUN 19; Creatinine, Ser 1.33; Potassium 3.6; Sodium 140; TSH 3.68    Lipid Panel Lab  Results  Component Value Date   CHOL 192 01/12/2021   HDL 77.10 01/12/2021   LDLCALC 94 01/12/2021   TRIG 105.0 01/12/2021      Wt Readings from Last 3 Encounters:  04/26/21 208 lb 4 oz (94.5 kg)  04/26/21 208 lb 12.8 oz (94.7 kg)  03/30/21 216 lb 12.8 oz (98.3 kg)       ASSESSMENT AND PLAN:  Problem List Items Addressed This Visit       Cardiology Problems   Acute on chronic diastolic CHF (congestive heart failure) (HCC) - Primary   Relevant Medications   spironolactone (ALDACTONE) 25 MG tablet   Torsemide 40 MG TABS   Other Relevant Orders   EKG 12-Lead   Pulmonary embolism (HCC)   Relevant Medications   spironolactone (ALDACTONE) 25 MG tablet   Torsemide 40 MG TABS   Other Relevant Orders   EKG 12-Lead   Atrial fibrillation (HCC)   Relevant Medications   spironolactone (ALDACTONE) 25 MG tablet   Torsemide 40 MG TABS    Other Relevant Orders   EKG 12-Lead   Persistent atrial fibrillation Rate controlled, no changes to medications, continue Eliquis Plan for cardioversion later this week or next week Has completed 4 weeks on Eliquis  Chronic diastolic CHF Recent hospital admission with significant diuresis Will continue torsemide 40 daily Recommended holding torsemide for any weight less than 205 for 1 day -Suspect her symptoms will improve with normal sinus rhythm has been restored Add potassium 10 daily Continue spironolactone, metoprolol  Essential hypertension Blood pressure running low 509T systolic, concern for prerenal state She will watch for orthostasis, recommend she call us for orthostasis symptoms, may need to hold torsemide for 1 day here and there    Total encounter time more than 35 minutes  Greater than 50% was spent in counseling and coordination of care with the patient    Signed, Esmond Plants, M.D., Ph.D. [place], [place]

## 2021-04-26 ENCOUNTER — Encounter: Payer: Self-pay | Admitting: Cardiovascular Disease

## 2021-04-26 ENCOUNTER — Ambulatory Visit (INDEPENDENT_AMBULATORY_CARE_PROVIDER_SITE_OTHER): Payer: Medicare HMO | Admitting: Adult Health

## 2021-04-26 ENCOUNTER — Other Ambulatory Visit: Payer: Self-pay

## 2021-04-26 ENCOUNTER — Ambulatory Visit: Payer: Medicare HMO | Admitting: Cardiovascular Disease

## 2021-04-26 ENCOUNTER — Encounter: Payer: Self-pay | Admitting: Adult Health

## 2021-04-26 VITALS — BP 100/60 | HR 83 | Ht 63.0 in | Wt 208.2 lb

## 2021-04-26 VITALS — BP 124/78 | HR 72 | Temp 98.0°F | Ht 63.0 in | Wt 208.8 lb

## 2021-04-26 DIAGNOSIS — R400 Somnolence: Secondary | ICD-10-CM | POA: Insufficient documentation

## 2021-04-26 DIAGNOSIS — I5033 Acute on chronic diastolic (congestive) heart failure: Secondary | ICD-10-CM

## 2021-04-26 DIAGNOSIS — I2699 Other pulmonary embolism without acute cor pulmonale: Secondary | ICD-10-CM

## 2021-04-26 DIAGNOSIS — I4819 Other persistent atrial fibrillation: Secondary | ICD-10-CM

## 2021-04-26 MED ORDER — POTASSIUM CHLORIDE ER 10 MEQ PO TBCR: 10.0000 meq | EXTENDED_RELEASE_TABLET | Freq: Every day | ORAL | 3 refills | Status: DC

## 2021-04-26 NOTE — Assessment & Plan Note (Signed)
Daytime sleepiness BMI 36, atrial fibrillation history all suspicious for underlying obstructive sleep apnea-with patient's underlying cardiac history recommend that she have an in lab study with split-night sleep study  - discussed how weight can impact sleep and risk for sleep disordered breathing - discussed options to assist with weight loss: combination of diet modification, cardiovascular and strength training exercises   - had an extensive discussion regarding the adverse health consequences related to untreated sleep disordered breathing - specifically discussed the risks for hypertension, coronary artery disease, cardiac dysrhythmias, cerebrovascular disease, and diabetes - lifestyle modification discussed   - discussed how sleep disruption can increase risk of accidents, particularly when driving - safe driving practices were discussed    Plan  Patient Instructions  Set up for split night sleep study  Healthy sleep regimen  Continue on Eliquis as directed.  Report any signs of bleeding .  Activity as tolerated.  Healthy weight loss  Do not drive if sleepy  Follow up with Dr. Mortimer Fries or Laray Corbit NP in 6-8 weeks and .As needed

## 2021-04-26 NOTE — Patient Instructions (Addendum)
We will help arrange a cardioversion for atrial fibrillation  Medication Instructions:  Your physician has recommended you make the following change in your medication:   1) START potassium 10 meq: - take 1 tablet by mouth once daily    If you need a refill on your cardiac medications before your next appointment, please call your pharmacy.   Lab work: No new labs needed  Testing/Procedures:  1) Cardioversion:  - Your physician has recommended that you have a Cardioversion (DCCV). Electrical Cardioversion uses a jolt of electricity to your heart either through paddles or wired patches attached to your chest. This is a controlled, usually prescheduled, procedure. Defibrillation is done under light anesthesia in the hospital, and you usually go home the day of the procedure. This is done to get your heart back into a normal rhythm.   You are scheduled for a Cardioversion on ________________ with Dr.___________  Please arrive at the [facility] at _________ a.m. on the day of your procedure.  Once you enter the [facility], proceed to the 1st desk on the right to check in (REGISTRATION).  DIET INSTRUCTIONS:  Nothing to eat or drink after midnight the night prior to your procedure         Labs: not needed  Medications:  You may take all of your regular medications with enough water to get them down safely unless listed below:  - HOLD torsemide the morning of your procedure  Must have a responsible person to drive you home.  Bring a current list of your medications and current insurance cards.    If you have any questions after you get home, please call the office at [phone]   Follow-Up: At [facility], you and your health needs are our priority.  As part of our continuing mission to provide you with exceptional heart care, we have created designated Provider Care Teams.  These Care Teams include your primary Cardiologist (physician) and Advanced Practice  Providers (APPs -  Physician Assistants and Nurse Practitioners) who all work together to provide you with the care you need, when you need it.  You will need a follow up appointment in 1 month  Providers on your designated Care Team:   Murray Hodgkins, NP Christell Faith, PA-C Cadence Kathlen Mody, [place]  COVID-19 Vaccine Information can be found at: ShippingScam.co.uk For questions related to vaccine distribution or appointments, please email vaccine@ .com or call [phone].     Electrical Cardioversion Electrical cardioversion is the delivery of a jolt of electricity to restore a normal rhythm to the heart. A rhythm that is too fast or is not regular keeps the heart from pumping well. In this procedure, sticky patches or metal paddles are placed on the chest to deliver electricity to the heart from a device. This procedure may be done in an emergency if: There is low or no blood pressure as a result of the heart rhythm. Normal rhythm must be restored as fast as possible to protect the brain and heart from further damage. It may save a life. This may also be a scheduled procedure for irregular or fast heart rhythms that are not immediately life-threatening. Tell a health care provider about: Any allergies you have. All medicines you are taking, including vitamins, herbs, eye drops, creams, and over-the-counter medicines. Any problems you or family members have had with anesthetic medicines. Any blood disorders you have. Any surgeries you have had. Any medical conditions you have. Whether you are pregnant or may be  pregnant. What are the risks? Generally, this is a safe procedure. However, problems may occur, including: Allergic reactions to medicines. A blood clot that breaks free and travels to other parts of your body. The possible return of an abnormal heart rhythm within hours or days after the procedure. Your heart  stopping (cardiac arrest). This is rare. What happens before the procedure? Medicines Your health care provider may have you start taking: Blood-thinning medicines (anticoagulants) so your blood does not clot as easily. Medicines to help stabilize your heart rate and rhythm. Ask your health care provider about: Changing or stopping your regular medicines. This is especially important if you are taking diabetes medicines or blood thinners. Taking medicines such as aspirin and ibuprofen. These medicines can thin your blood. Do not take these medicines unless your health care provider tells you to take them. Taking over-the-counter medicines, vitamins, herbs, and supplements. General instructions Follow instructions from your health care provider about eating or drinking restrictions. Plan to have someone take you home from the hospital or clinic. If you will be going home right after the procedure, plan to have someone with you for 24 hours. Ask your health care provider what steps will be taken to help prevent infection. These may include washing your skin with a germ-killing soap. What happens during the procedure?  An IV will be inserted into one of your veins. Sticky patches (electrodes) or metal paddles may be placed on your chest. You will be given a medicine to help you relax (sedative). An electrical shock will be delivered. The procedure may vary among health care providers and hospitals. What can I expect after the procedure? Your blood pressure, heart rate, breathing rate, and blood oxygen level will be monitored until you leave the hospital or clinic. Your heart rhythm will be watched to make sure it does not change. You may have some redness on the skin where the shocks were given. Follow these instructions at home: Do not drive for 24 hours if you were given a sedative during your procedure. Take over-the-counter and prescription medicines only as told by your health care  provider. Ask your health care provider how to check your pulse. Check it often. Rest for 48 hours after the procedure or as told by your health care provider. Avoid or limit your caffeine use as told by your health care provider. Keep all follow-up visits as told by your health care provider. This is important. Contact a health care provider if: You feel like your heart is beating too quickly or your pulse is not regular. You have a serious muscle cramp that does not go away. Get help right away if: You have discomfort in your chest. You are dizzy or you feel faint. You have trouble breathing or you are short of breath. Your speech is slurred. You have trouble moving an arm or leg on one side of your body. Your fingers or toes turn cold or blue. Summary Electrical cardioversion is the delivery of a jolt of electricity to restore a normal rhythm to the heart. This procedure may be done right away in an emergency or may be a scheduled procedure if the condition is not an emergency. Generally, this is a safe procedure. After the procedure, check your pulse often as told by your health care provider. This information is not intended to replace advice given to you by your health care provider. Make sure you discuss any questions you have with your health care provider. Document Revised: 11/05/2018  Document Reviewed: 11/05/2018 Elsevier Patient Education  [place].

## 2021-04-26 NOTE — Assessment & Plan Note (Signed)
Recent admission with decompensated congestive heart failure appears to be clinically improved and euvolemic on exam.  Patient to follow-up with cardiology today as planned.  Continue on current regimen.

## 2021-04-26 NOTE — Patient Instructions (Signed)
Set up for split night sleep study  Healthy sleep regimen  Continue on Eliquis as directed.  Report any signs of bleeding .  Activity as tolerated.  Healthy weight loss  Do not drive if sleepy  Follow up with Dr. Mortimer Fries or Isamu Trammel NP in 6-8 weeks and .As needed

## 2021-04-26 NOTE — Progress Notes (Signed)
Reviewed and agree with assessment/plan.   Chesley Mires, MD [facility] Pulmonary/Critical Care 04/26/2021, 4:25 PM Pager:  [phone]

## 2021-04-26 NOTE — Assessment & Plan Note (Signed)
Recent hospitalization with CT chest showing PE.  Patient is already on Eliquis.  Continue on Eliquis as planned. Monitor closely as patient has a history of GI bleed.  2D echo shows evidence of pulmonary hypertension with mildly elevated pulmonary artery pressure. May be secondary to recent PE and or underlying sleep apnea.  Continue to monitor.  Plan  Patient Instructions  Set up for split night sleep study  Healthy sleep regimen  Continue on Eliquis as directed.  Report any signs of bleeding .  Activity as tolerated.  Healthy weight loss  Do not drive if sleepy  Follow up with Dr. Mortimer Fries or Ahri Olson NP in 6-8 weeks and .As needed

## 2021-04-26 NOTE — Progress Notes (Signed)
@Patient  ID: Amanda Browning, female    DOB: 1945-09-15, 76 y.o.   MRN: [id number]  Chief Complaint  Patient presents with   Follow-up    Referring provider: Kate Sable, MD  HPI: 76 year old female seen for sleep consult April 26, 2021 for 04/26/2021 for daytime sleepiness and atrial fib  Medical history significant for GI bleed, A. fib, hyperlipidemia, GERD, hiatal hernia, hypertension   TEST/EVENTS :   04/26/2021 Sleep Consult  Patient presents for a sleep consult. Referred by cardiology. She has Atrial Fib and CHF.  Patient typically goes to bed about 11 PM.  Gets up about 630 to 7 AM.  Usually takes about 20 to 30 minutes to go to sleep.  Gets up about 3-4 times each night.  Does not operate heavy machinery.  Weight is only been up about 5 pounds over the last 2 years.  Has never been diagnosed with sleep apnea or had a sleep study in the past.  Past medical history is significant for hypertension, congestive heart failure.  Recently found to have atrial fibrillation.  2D echo March 15, 2021 showed EF at 55 to 60%, moderately elevated pulmonary artery systolic pressure at 46 mmHg, mildly dilated right and left atrium and moderate tricuspid valve regurg.  Typical caffeine intake is minimal . Watches TV to go to bed.  Epworth score is 3. Unsure if she snores. Rarely naps.  Very little caffeine intake.  No home oxygen. No symptoms for cataplexy or sleep paralysis.    Recently admitted last week at [facility] for CHF decompensation, also found PE. She was continued on Eliquis.  No known bleeding . No hemoptysis.  Patient says she is feeling much better since hospitalization.  Lower extremity swelling has decreased.  Her shortness of breath is decreased substantially.  SH: Divorced. 3 adult kids. Works part Doctor, hospital. Drives. Lives at home independent.  Never smoker. No alcohol.   SH : Hysterectomy , Knee surgery-right   FH : CAD, DM   Allergies  Allergen Reactions    Codeine Other (See Comments)    Can not function Other reaction(s): Other (See Comments) Altered mental status- "Cannot function" Can not function Can not function    Immunization History  Administered Date(s) Administered   Fluad Quad(high Dose 65+) 02/12/2019, 01/07/2020, 01/12/2021   Influenza Split 04/03/2012   Influenza, High Dose Seasonal PF 01/20/2017, 01/31/2018   Influenza,inj,Quad PF,6+ Mos 01/15/2013, 01/23/2014, 12/10/2014   Pneumococcal Conjugate-13 01/23/2014   Pneumococcal Polysaccharide-23 01/15/2013    Past Medical History:  Diagnosis Date   Anemia    Chicken pox    Colon polyps    Complication of anesthesia    doesn't take much anesthesia to put pt to sleep, slow to wake up   DDD (degenerative disc disease), cervical 02/05/13   C5-C6 and C6-C7   GERD (gastroesophageal reflux disease)    History of chicken pox    Hypercholesterolemia    Hyperlipidemia    Hypertension    Hypothyroidism    Migraines    PONV (postoperative nausea and vomiting)    Squamous cell skin cancer     Tobacco History: Social History   Tobacco Use  Smoking Status Never  Smokeless Tobacco Never   Counseling given: Not Answered   Outpatient Medications Prior to Visit  Medication Sig Dispense Refill   acetaminophen (TYLENOL) 325 MG tablet Take 650 mg by mouth 4 (four) times daily as needed.     apixaban (ELIQUIS) 5 MG TABS tablet Take 1  tablet (5 mg total) by mouth 2 (two) times daily. 60 tablet 3   cetirizine (ZYRTEC) 10 MG tablet Take 10 mg by mouth daily.     diltiazem (CARDIZEM CD) 240 MG 24 hr capsule Take 1 capsule (240 mg total) by mouth daily. 30 capsule 3   ferrous sulfate 325 (65 FE) MG tablet Take 325 mg by mouth daily with breakfast.     levothyroxine (SYNTHROID) 75 MCG tablet Take 1 tablet (75 mcg total) by mouth daily at 6 (six) AM. 90 tablet 0   losartan (COZAAR) 25 MG tablet Take 25 mg by mouth daily.     magnesium oxide (MAG-OX) 400 MG tablet Take 1 tablet  (400 mg total) by mouth 2 (two) times daily. 60 tablet 2   metoprolol tartrate (LOPRESSOR) 100 MG tablet Take 1 tablet (100 mg total) by mouth 2 (two) times daily. 60 tablet 1   Multiple Vitamin (MULTIVITAMIN) tablet Take 1 tablet by mouth daily.     omeprazole (PRILOSEC) 20 MG capsule TAKE 1 CAPSULE TWICE DAILY BEFORE MEALS 180 capsule 1   rosuvastatin (CRESTOR) 5 MG tablet Take 5 mg by mouth daily.     sertraline (ZOLOFT) 50 MG tablet TAKE 1/2 TABLET DAILY FOR 1 WEEK, THEN TAKE 1 TABLET DAILY (Patient taking differently: Take 50 mg by mouth at bedtime.) 90 tablet 1   Spironolactone 25 MG/5ML SUSP Take by mouth.     torsemide (DEMADEX) 20 MG tablet Take 1 tablet (20 mg total) by mouth daily. 30 tablet 0   albuterol (VENTOLIN HFA) 108 (90 Base) MCG/ACT inhaler Inhale 2 puffs into the lungs every 6 (six) hours as needed for wheezing or shortness of breath. 1 each 0   aspirin EC 81 MG tablet Take 81 mg by mouth daily.     lovastatin (MEVACOR) 40 MG tablet TAKE 1 TABLET AT BEDTIME 90 tablet 3   vitamin B-12 (CYANOCOBALAMIN) 1000 MCG tablet Take 1 tablet by mouth daily.     No facility-administered medications prior to visit.     Review of Systems:   Constitutional:   No  weight loss, night sweats,  Fevers, chills,  +fatigue, or  lassitude.  HEENT:   No headaches,  Difficulty swallowing,  Tooth/dental problems, or  Sore throat,                No sneezing, itching, ear ache, nasal congestion, post nasal drip,   CV:  No chest pain,  Orthopnea, PND,  anasarca, dizziness, palpitations, syncope.   GI  No heartburn, indigestion, abdominal pain, nausea, vomiting, diarrhea, change in bowel habits, loss of appetite, bloody stools.   Resp:   No excess mucus, no productive cough,  No non-productive cough,  No coughing up of blood.  No change in color of mucus.  No wheezing.  No chest wall deformity  Skin: no rash or lesions.  GU: no dysuria, change in color of urine, no urgency or frequency.  No  flank pain, no hematuria   MS:  No joint pain or swelling.  No decreased range of motion.  No back pain.    Physical Exam  BP 124/78 (BP Location: Left Arm, Cuff Size: Normal)    Pulse 72    Temp 98 F (36.7 C) (Oral)    Ht 5\' 3"  (1.6 m)    Wt 208 lb 12.8 oz (94.7 kg)    SpO2 93%    BMI 36.99 kg/m   GEN: A/Ox3; pleasant , NAD, well nourished  HEENT:  [place]/AT,  EACs-clear, TMs-wnl, NOSE-clear, THROAT-clear, no lesions, no postnasal drip or exudate noted.  Class II-III MP airway  NECK:  Supple w/ fair ROM; no JVD; normal carotid impulses w/o bruits; no thyromegaly or nodules palpated; no lymphadenopathy.    RESP  Clear  P & A; w/o, wheezes/ rales/ or rhonchi. no accessory muscle use, no dullness to percussion  CARD:  irreg  no m/r/g, tr  peripheral edema, pulses intact, no cyanosis or clubbing.  GI:   Soft & nt; nml bowel sounds; no organomegaly or masses detected.   Musco: Warm bil, no deformities or joint swelling noted.   Neuro: alert, no focal deficits noted.    Skin: Warm, no lesions or rashes    Lab Results:     BMET   BNP No results found for: BNP  ProBNP No results found for: PROBNP  Imaging:     No flowsheet data found.  No results found for: NITRICOXIDE      Assessment & Plan:   Daytime sleepiness Daytime sleepiness BMI 36, atrial fibrillation history all suspicious for underlying obstructive sleep apnea-with patient's underlying cardiac history recommend that she have an in lab study with split-night sleep study  - discussed how weight can impact sleep and risk for sleep disordered breathing - discussed options to assist with weight loss: combination of diet modification, cardiovascular and strength training exercises   - had an extensive discussion regarding the adverse health consequences related to untreated sleep disordered breathing - specifically discussed the risks for hypertension, coronary artery disease, cardiac dysrhythmias,  cerebrovascular disease, and diabetes - lifestyle modification discussed   - discussed how sleep disruption can increase risk of accidents, particularly when driving - safe driving practices were discussed    Plan  Patient Instructions  Set up for split night sleep study  Healthy sleep regimen  Continue on Eliquis as directed.  Report any signs of bleeding .  Activity as tolerated.  Healthy weight loss  Do not drive if sleepy  Follow up with Dr. Mortimer Fries or Dorlisa Savino NP in 6-8 weeks and .As needed         Acute on chronic diastolic CHF (congestive heart failure) ([place]) Recent admission with decompensated congestive heart failure appears to be clinically improved and euvolemic on exam.  Patient to follow-up with cardiology today as planned.  Continue on current regimen.  Pulmonary embolism ([place]) Recent hospitalization with CT chest showing PE.  Patient is already on Eliquis.  Continue on Eliquis as planned. Monitor closely as patient has a history of GI bleed.  2D echo shows evidence of pulmonary hypertension with mildly elevated pulmonary artery pressure. May be secondary to recent PE and or underlying sleep apnea.  Continue to monitor.  Plan  Patient Instructions  Set up for split night sleep study  Healthy sleep regimen  Continue on Eliquis as directed.  Report any signs of bleeding .  Activity as tolerated.  Healthy weight loss  Do not drive if sleepy  Follow up with Dr. Mortimer Fries or Lynora Dymond NP in 6-8 weeks and .As needed           Rexene Edison, NP 04/26/2021

## 2021-04-27 ENCOUNTER — Telehealth: Payer: Self-pay

## 2021-04-27 NOTE — Telephone Encounter (Signed)
Was able to reach out to Amanda Browning's daughter Amanda Browning (DPR approved) to give time and date of cardioversion discuss at yesterday's OV with Dr. Rockey Browning  Thursday 1/11 at 08:00 am, arrive at 07:00 am at [facility]  Reminded pt of instructions of:   Once you enter the [facility], proceed to the 1st desk on the right to check in (REGISTRATION).   DIET INSTRUCTIONS:  Nothing to eat or drink after midnight the night prior to your procedure          Labs: not needed   Medications:  You may take all of your regular medications with enough water to get them down safely unless listed below:   - HOLD torsemide the morning of your procedure   Must have a responsible person to drive you home.   Bring a current list of your medications and current insurance cards.          If you have any questions after you get home, please call the office at [phone]  Amanda Browning thankful for phone call of date and time, verbalized understanding of instructions. Will call back with any further questions

## 2021-04-28 ENCOUNTER — Other Ambulatory Visit: Payer: Self-pay | Admitting: Cardiovascular Disease

## 2021-04-28 DIAGNOSIS — I4819 Other persistent atrial fibrillation: Secondary | ICD-10-CM

## 2021-04-29 ENCOUNTER — Ambulatory Visit
Admission: RE | Admit: 2021-04-29 | Discharge: 2021-04-29 | Disposition: A | Discharge status: Home / Self Care | Payer: Medicare HMO | Attending: Cardiovascular Disease | Admitting: Cardiovascular Disease

## 2021-04-29 ENCOUNTER — Encounter
Admission: RE | Disposition: A | Discharge status: Home / Self Care | Payer: Self-pay | Source: Home / Self Care | Attending: Cardiovascular Disease

## 2021-04-29 ENCOUNTER — Ambulatory Visit: Payer: Medicare HMO | Admitting: Anesthesiology

## 2021-04-29 ENCOUNTER — Encounter: Payer: Self-pay | Admitting: Cardiovascular Disease

## 2021-04-29 DIAGNOSIS — Z79899 Other long term (current) drug therapy: Secondary | ICD-10-CM | POA: Diagnosis not present

## 2021-04-29 DIAGNOSIS — I2699 Other pulmonary embolism without acute cor pulmonale: Secondary | ICD-10-CM | POA: Diagnosis not present

## 2021-04-29 DIAGNOSIS — I110 Hypertensive heart disease with heart failure: Secondary | ICD-10-CM | POA: Insufficient documentation

## 2021-04-29 DIAGNOSIS — E785 Hyperlipidemia, unspecified: Secondary | ICD-10-CM | POA: Diagnosis not present

## 2021-04-29 DIAGNOSIS — I5033 Acute on chronic diastolic (congestive) heart failure: Secondary | ICD-10-CM | POA: Insufficient documentation

## 2021-04-29 DIAGNOSIS — I4891 Unspecified atrial fibrillation: Secondary | ICD-10-CM | POA: Diagnosis not present

## 2021-04-29 DIAGNOSIS — Z7901 Long term (current) use of anticoagulants: Secondary | ICD-10-CM | POA: Insufficient documentation

## 2021-04-29 DIAGNOSIS — I4819 Other persistent atrial fibrillation: Secondary | ICD-10-CM | POA: Diagnosis not present

## 2021-04-29 DIAGNOSIS — K219 Gastro-esophageal reflux disease without esophagitis: Secondary | ICD-10-CM | POA: Diagnosis not present

## 2021-04-29 HISTORY — PX: CARDIOVERSION: SHX1299

## 2021-04-29 SURGERY — CARDIOVERSION
Anesthesia: General

## 2021-04-29 MED ORDER — SODIUM CHLORIDE 0.9 % IV SOLN
INTRAVENOUS | Status: DC
  Administered 2021-04-29: 1000 mL via INTRAVENOUS

## 2021-04-29 MED ORDER — ONDANSETRON HCL 4 MG/2ML IJ SOLN: 4.0000 mg | Freq: Once | INTRAMUSCULAR | Status: DC | PRN

## 2021-04-29 MED ORDER — PROPOFOL 10 MG/ML IV BOLUS
INTRAVENOUS | Status: DC | PRN
Start: 2021-04-29 — End: 2021-04-29
  Administered 2021-04-29: 50 mg via INTRAVENOUS
  Administered 2021-04-29: 20 mg via INTRAVENOUS

## 2021-04-29 MED ORDER — FENTANYL CITRATE (PF) 100 MCG/2ML IJ SOLN: 25.0000 ug | INTRAMUSCULAR | Status: DC | PRN

## 2021-04-29 NOTE — Anesthesia Preprocedure Evaluation (Signed)
Anesthesia Evaluation  Patient identified by MRN, date of birth, ID band Patient awake    Reviewed: Allergy & Precautions, H&P , NPO status , Patient's Chart, lab work & pertinent test results, reviewed documented beta blocker date and time   History of Anesthesia Complications (+) PONV and history of anesthetic complications  Airway Mallampati: II   Neck ROM: full    Dental  (+) Teeth Intact   Pulmonary neg pulmonary ROS,    Pulmonary exam normal        Cardiovascular Exercise Tolerance: Poor hypertension, On Medications +CHF  Atrial Fibrillation  Rhythm:regular Rate:Normal     Neuro/Psych  Headaches,  Neuromuscular disease negative psych ROS   GI/Hepatic Neg liver ROS, hiatal hernia, PUD, GERD  Medicated,  Endo/Other  Hypothyroidism   Renal/GU negative Renal ROS  negative genitourinary   Musculoskeletal   Abdominal   Peds  Hematology  (+) Blood dyscrasia, anemia ,   Anesthesia Other Findings Past Medical History: No date: Anemia No date: Chicken pox No date: Colon polyps No date: Complication of anesthesia     Comment:  doesn't take much anesthesia to put pt to sleep, slow to              wake up 02/05/13: DDD (degenerative disc disease), cervical     Comment:  C5-C6 and C6-C7 No date: GERD (gastroesophageal reflux disease) No date: History of chicken pox No date: Hypercholesterolemia No date: Hyperlipidemia No date: Hypertension No date: Hypothyroidism No date: Migraines No date: PONV (postoperative nausea and vomiting) No date: Squamous cell skin cancer Past Surgical History: 2012: BREAST BIOPSY; Right     Comment:  stereotactic 02/23/2018: COLONOSCOPY WITH PROPOFOL; N/A     Comment:  Procedure: COLONOSCOPY WITH PROPOFOL;  Surgeon: Manya Silvas, MD;  Location[facility] ENDOSCOPY;  Service:               Endoscopy;  Laterality: N/A; 03/15/2021: ESOPHAGOGASTRODUODENOSCOPY; N/A      Comment:  Procedure: ESOPHAGOGASTRODUODENOSCOPY (EGD);  Surgeon:               Lin Landsman, MD;  Location: [facility] ENDOSCOPY;                Service: Gastroenterology;  Laterality: N/A; 04/17/2017: KNEE ARTHROPLASTY; Right     Comment:  Procedure: COMPUTER ASSISTED TOTAL KNEE ARTHROPLASTY;                Surgeon: Dereck Leep, MD;  Location: [facility] ORS;                Service: Orthopedics;  Laterality: Right; No date: SKIN BIOPSY 1994: VAGINAL HYSTERECTOMY     Comment:  ovaries not removed No date: WISDOM TOOTH EXTRACTION   Reproductive/Obstetrics negative OB ROS                             Anesthesia Physical Anesthesia Plan  ASA: 4  Anesthesia Plan: General   Post-op Pain Management:    Induction:   PONV Risk Score and Plan:   Airway Management Planned:   Additional Equipment:   Intra-op Plan:   Post-operative Plan:   Informed Consent: I have reviewed the patients History and Physical, chart, labs and discussed the procedure including the risks, benefits and alternatives for the proposed anesthesia with the patient or authorized representative who has indicated his/her understanding and acceptance.  Dental Advisory Given  Plan Discussed with: CRNA  Anesthesia Plan Comments:         Anesthesia Quick Evaluation

## 2021-04-29 NOTE — Transfer of Care (Signed)
Immediate Anesthesia Transfer of Care Note  Patient: Amanda Browning  Procedure(s) Performed: CARDIOVERSION  Patient Location: spu  Anesthesia Type:General  Level of Consciousness: awake and alert   Airway & Oxygen Therapy: Patient Spontanous Breathing and Patient connected to nasal cannula oxygen  Post-op Assessment: Report given to RN and Post -op Vital signs reviewed and stable  Post vital signs: Reviewed  Last Vitals:  Vitals Value Taken Time  BP 111/60 04/29/21 0803  Temp    Pulse 25 04/29/21 0803  Resp 20 04/29/21 0803  SpO2 90 % 04/29/21 0803  Vitals shown include unvalidated device data.  Last Pain:  Vitals:   04/29/21 0739  TempSrc: Oral  PainSc: 0-No pain         Complications: No notable events documented.

## 2021-04-29 NOTE — CV Procedure (Signed)
Cardioversion procedure note °For atrial fibrillation, persistent. ° °Procedure Details: ° °Consent: Risks of procedure as well as the alternatives and risks of each were explained to the (patient/caregiver).  Consent for procedure obtained. ° °Time Out: Verified patient identification, verified procedure, site/side was marked, verified correct patient position, special equipment/implants available, medications/allergies/relevent history reviewed, required imaging and test results available.  Performed ° °Patient placed on cardiac monitor, pulse oximetry, supplemental oxygen as necessary.   °Sedation given: propofol IV, Dr. Adams °Pacer pads placed anterior and posterior chest. ° ° °Cardioverted 1 time(s).   °Cardioverted at  150 J. Synchronized biphasic °Converted to NSR ° ° °Evaluation: °Findings: Post procedure EKG shows: NSR °Complications: None °Patient did tolerate procedure well. ° °Time Spent Directly with the Patient: ° °45 minutes  ° °Amanda Browning, M.D., Ph.D.  °

## 2021-05-02 NOTE — H&P (Signed)
H&P Addendum, pre-cardioversion ° °Patient was seen and evaluated prior to -cardioversion procedure °Symptoms, prior testing details again confirmed with the patient °Patient examined, no significant change from prior exam °Lab work reviewed in detail personally by myself °Patient understands risk and benefit of the procedure,  °The risks (stroke, cardiac arrhythmias rarely resulting in the need for a temporary or permanent pacemaker, skin irritation or burns and complications associated with conscious sedation including aspiration, arrhythmia, respiratory failure and death), benefits (restoration of normal sinus rhythm) and alternatives of a direct current cardioversion were explained in detail °Patient willing to proceed. ° °Signed, °Tim Masaye Gatchalian, MD, Ph.D °[facility]  °

## 2021-05-02 NOTE — Interval H&P Note (Signed)
History and Physical Interval Note:  05/02/2021 6:47 PM  Amanda Browning  has presented today for surgery, with the diagnosis of Cardioversion   Afib    OK 2nd case per Dr Wynetta Emery Anesthesia  Start 8a.  The various methods of treatment have been discussed with the patient and family. After consideration of risks, benefits and other options for treatment, the patient has consented to  Procedure(s): CARDIOVERSION (N/A) as a surgical intervention.  The patient's history has been reviewed, patient examined, no change in status, stable for surgery.  I have reviewed the patient's chart and labs.  Questions were answered to the patient's satisfaction.     Amanda Browning

## 2021-05-02 NOTE — Anesthesia Postprocedure Evaluation (Signed)
Anesthesia Post Note  Patient: Amanda Browning  Procedure(s) Performed: CARDIOVERSION  Patient location during evaluation: PACU Anesthesia Type: General Level of consciousness: awake and alert Pain management: pain level controlled Vital Signs Assessment: post-procedure vital signs reviewed and stable Respiratory status: spontaneous breathing, nonlabored ventilation, respiratory function stable and patient connected to nasal cannula oxygen Cardiovascular status: blood pressure returned to baseline and stable Postop Assessment: no apparent nausea or vomiting Anesthetic complications: no   No notable events documented.   Last Vitals:  Vitals:   04/29/21 0815 04/29/21 0830  BP: 102/64 108/80  Pulse: 71 (!) 56  Resp: 15 20  Temp:    SpO2: 93% 96%    Last Pain:  Vitals:   04/29/21 0830  TempSrc:   PainSc: 0-No pain                 Molli Barrows

## 2021-05-03 ENCOUNTER — Telehealth: Payer: Self-pay

## 2021-05-03 NOTE — Telephone Encounter (Signed)
Received a fax from [facility] requesting refills on Torsemide 20 mg, Diltiazem CD mg, and Levothyroxine. All are historical medications. Is it okay to refill?

## 2021-05-04 ENCOUNTER — Other Ambulatory Visit: Payer: Self-pay

## 2021-05-04 MED ORDER — LEVOTHYROXINE SODIUM 75 MCG PO TABS: 75.0000 ug | ORAL_TABLET | Freq: Every day | ORAL | 0 refills | Status: DC

## 2021-05-04 MED ORDER — DILTIAZEM HCL ER COATED BEADS 240 MG PO CP24: 240.0000 mg | ORAL_CAPSULE | Freq: Every day | ORAL | 1 refills | Status: DC

## 2021-05-04 MED ORDER — TORSEMIDE 20 MG PO TABS: 20.0000 mg | ORAL_TABLET | Freq: Every day | ORAL | 1 refills | Status: DC | PRN

## 2021-05-04 NOTE — Telephone Encounter (Signed)
I am ok to refill these medications, but need to confirm how much torsemide she is taking.

## 2021-05-04 NOTE — Telephone Encounter (Signed)
Ok to send in refills needed.  For the torsemide, ok to send in as she is taking 20-40mg  q day prn #60 with one refill.

## 2021-05-04 NOTE — Telephone Encounter (Signed)
I notified patient;s daughter that Torsemide was sent to [facility] #60 with 1 refill.

## 2021-05-04 NOTE — Telephone Encounter (Signed)
I called and spoke with patient's daughter, [name]. She stated that patient does not take every day ut does take 20-40 mg depending on fluid retention. She has not had any the last two days. She said this was instructed by Dr. Rockey Situ whom they saw 1/9. Are you okay with my refilling as used?

## 2021-05-05 ENCOUNTER — Other Ambulatory Visit: Payer: Self-pay

## 2021-05-05 MED ORDER — METOPROLOL TARTRATE 100 MG PO TABS: 100.0000 mg | ORAL_TABLET | Freq: Two times a day (BID) | ORAL | 0 refills | Status: DC

## 2021-05-10 ENCOUNTER — Other Ambulatory Visit: Payer: Self-pay | Admitting: Internal Medicine

## 2021-05-13 ENCOUNTER — Other Ambulatory Visit: Payer: Self-pay

## 2021-05-13 ENCOUNTER — Encounter: Payer: Self-pay | Admitting: Internal Medicine

## 2021-05-13 ENCOUNTER — Ambulatory Visit (INDEPENDENT_AMBULATORY_CARE_PROVIDER_SITE_OTHER): Payer: Medicare HMO | Admitting: Internal Medicine

## 2021-05-13 VITALS — BP 130/74 | HR 76 | Temp 98.0°F | Resp 16 | Ht 63.0 in | Wt 209.0 lb

## 2021-05-13 DIAGNOSIS — I2699 Other pulmonary embolism without acute cor pulmonale: Secondary | ICD-10-CM

## 2021-05-13 DIAGNOSIS — F439 Reaction to severe stress, unspecified: Secondary | ICD-10-CM

## 2021-05-13 DIAGNOSIS — E875 Hyperkalemia: Secondary | ICD-10-CM

## 2021-05-13 DIAGNOSIS — K2100 Gastro-esophageal reflux disease with esophagitis, without bleeding: Secondary | ICD-10-CM

## 2021-05-13 DIAGNOSIS — I4891 Unspecified atrial fibrillation: Secondary | ICD-10-CM

## 2021-05-13 DIAGNOSIS — I10 Essential (primary) hypertension: Secondary | ICD-10-CM

## 2021-05-13 DIAGNOSIS — E039 Hypothyroidism, unspecified: Secondary | ICD-10-CM | POA: Diagnosis not present

## 2021-05-13 DIAGNOSIS — K449 Diaphragmatic hernia without obstruction or gangrene: Secondary | ICD-10-CM | POA: Diagnosis not present

## 2021-05-13 DIAGNOSIS — D509 Iron deficiency anemia, unspecified: Secondary | ICD-10-CM

## 2021-05-13 DIAGNOSIS — R7303 Prediabetes: Secondary | ICD-10-CM

## 2021-05-13 DIAGNOSIS — K219 Gastro-esophageal reflux disease without esophagitis: Secondary | ICD-10-CM | POA: Diagnosis not present

## 2021-05-13 DIAGNOSIS — K257 Chronic gastric ulcer without hemorrhage or perforation: Secondary | ICD-10-CM

## 2021-05-13 DIAGNOSIS — E7800 Pure hypercholesterolemia, unspecified: Secondary | ICD-10-CM

## 2021-05-13 LAB — BASIC METABOLIC PANEL
BUN: 15 mg/dL (ref 6–23)
CO2: 31 mEq/L (ref 19–32)
Calcium: 9.9 mg/dL (ref 8.4–10.5)
Chloride: 101 mEq/L (ref 96–112)
Creatinine, Ser: 1.18 mg/dL (ref 0.40–1.20)
GFR: 45.23 mL/min — ABNORMAL LOW (ref >60.00)
Glucose, Bld: 95 mg/dL (ref 70–99)
Potassium: 5.2 mEq/L — ABNORMAL HIGH (ref 3.5–5.1)
Sodium: 138 mEq/L (ref 135–145)

## 2021-05-13 MED ORDER — ROSUVASTATIN CALCIUM 5 MG PO TABS: 5.0000 mg | ORAL_TABLET | Freq: Every evening | ORAL | 3 refills | Status: DC

## 2021-05-13 MED ORDER — LOSARTAN POTASSIUM 25 MG PO TABS: 12.5000 mg | ORAL_TABLET | Freq: Every morning | ORAL | 2 refills | Status: DC

## 2021-05-13 MED ORDER — SPIRONOLACTONE 25 MG PO TABS: 25.0000 mg | ORAL_TABLET | Freq: Every day | ORAL | 2 refills | Status: DC

## 2021-05-13 NOTE — Progress Notes (Signed)
Patient ID: Amanda Browning, female   DOB: 09-23-1945, 76 y.o.   MRN: [id number]   Subjective:    Patient ID: Amanda Browning, female    DOB: 04-Jun-1945, 76 y.o.   MRN: [id number]  This visit occurred during the SARS-CoV-2 public health emergency.  Safety protocols were in place, including screening questions prior to the visit, additional usage of staff PPE, and extensive cleaning of exam room while observing appropriate contact time as indicated for disinfecting solutions.   Patient here for a scheduled follow up.   Chief Complaint  Patient presents with   Hyperlipidemia   Hypertension   .   HPI Was admitted 03/12/21 - 03/20/21 with dark stools and dark colored vomiting. Hgb 7.2.  transfused 1 unit PRBC.  CT antio - no active arterial extravasation or other acute findings.  EGD 03/15/21 - hiatal hernia and ulcer with no active bleeding.  Recommended PPI bid.  Also developed new onset afib with RVR .  Had problems with fluid retention.  Admitted to [facility] - diastolic CHF requiring IV lasix.  Discharged on torsemide 23m q day.  CTA - acute right lower lobe pulmonary embolism.  On eliquis.  Saw Dr GRockey Situin follow up - 04/26/21.  Recommended continuing eliquis and recommended cardioversion.  Also recommended continuing torsemide 41mq day.  S/p cardioversion 04/29/21.  Since cardioversion, she has done well.  Breathing is better.  No increased sob.  No cough or congestion.  Off torsemide.  Has not needed for two weeks.  Weight at home - stable - 206 pounds.     Past Medical History:  Diagnosis Date   Anemia    Chicken pox    Colon polyps    Complication of anesthesia    doesn't take much anesthesia to put pt to sleep, slow to wake up   DDD (degenerative disc disease), cervical 02/05/13   C5-C6 and C6-C7   GERD (gastroesophageal reflux disease)    History of chicken pox    Hypercholesterolemia    Hyperlipidemia    Hypertension    Hypothyroidism    Migraines    PONV (postoperative nausea  and vomiting)    Squamous cell skin cancer    Past Surgical History:  Procedure Laterality Date   BREAST BIOPSY Right 2012   stereotactic   CARDIOVERSION N/A 04/29/2021   Procedure: CARDIOVERSION;  Surgeon: GoMinna MerrittsMD;  Location: [facility];  Service: Cardiovascular;  Laterality: N/A;   COLONOSCOPY WITH PROPOFOL N/A 02/23/2018   Procedure: COLONOSCOPY WITH PROPOFOL;  Surgeon: ElManya SilvasMD;  [facility];  Service: Endoscopy;  Laterality: N/A;   ESOPHAGOGASTRODUODENOSCOPY N/A 03/15/2021   Procedure: ESOPHAGOGASTRODUODENOSCOPY (EGD);  Surgeon: VaLin LandsmanMD;  [facility];  Service: Gastroenterology;  Laterality: N/A;   KNEE ARTHROPLASTY Right 04/17/2017   Procedure: COMPUTER ASSISTED TOTAL KNEE ARTHROPLASTY;  Surgeon: HoDereck LeepMD;  Location: [facility] ORS;  Service: Orthopedics;  Laterality: Right;   SKIN BIOPSY     VAGINAL HYSTERECTOMY  1994   ovaries not removed   WISDOM TOOTH EXTRACTION     Family History  Problem Relation Age of Onset   Heart disease Mother    Stroke Mother    Hypertension Mother    Heart attack Mother    Heart disease Father        enlarged heart   Stroke Father    Hypertension Father    Hyperlipidemia Father    Heart attack Father    Ovarian  cancer Paternal Aunt    Breast cancer Sister 41   Cancer Sister        Breast Cancer   Diabetes type II Brother    Hypertension Brother    Colon cancer Neg Hx    Basal cell carcinoma Neg Hx    Melanoma Neg Hx    Squamous cell carcinoma Neg Hx    Social History   Socioeconomic History   Marital status: Single    Spouse name: Not on file   Number of children: 2   Years of education: 12   Highest education level: High school graduate  Occupational History   Not on file  Tobacco Use   Smoking status: Never   Smokeless tobacco: Never  Vaping Use   Vaping Use: Never used  Substance and Sexual Activity   Alcohol use: No    Alcohol/week: 0.0 standard  drinks   Drug use: No   Sexual activity: Never  Other Topics Concern   Not on file  Social History Narrative   Full Code   Alcohol - none   Never smoker and no smokeless tobacco   2 daughters   Social Determinants of Health   Financial Resource Strain: Not on file  Food Insecurity: Not on file  Transportation Needs: Not on file  Physical Activity: Not on file  Stress: Not on file  Social Connections: Not on file     Review of Systems  Constitutional:  Negative for appetite change and unexpected weight change.  HENT:  Negative for congestion and sinus pressure.   Respiratory:  Negative for cough and chest tightness.        Breathing improved.   Cardiovascular:  Negative for chest pain and palpitations.       No increased swelling.   Gastrointestinal:  Negative for abdominal pain, diarrhea, nausea and vomiting.  Genitourinary:  Negative for difficulty urinating and dysuria.  Musculoskeletal:  Negative for joint swelling and myalgias.  Skin:  Negative for color change and rash.  Neurological:  Negative for dizziness, light-headedness and headaches.  Psychiatric/Behavioral:  Negative for agitation and dysphoric mood.       Objective:     BP 130/74    Pulse 76    Temp 98 F (36.7 C)    Resp 16    Wt 209 lb (94.8 kg)    SpO2 98%    BMI 37.02 kg/m  Wt Readings from Last 3 Encounters:  05/13/21 209 lb (94.8 kg)  04/29/21 205 lb (93 kg)  04/26/21 208 lb 4 oz (94.5 kg)    Physical Exam Vitals reviewed.  Constitutional:      General: She is not in acute distress.    Appearance: Normal appearance.  HENT:     Head: Normocephalic and atraumatic.     Right Ear: External ear normal.     Left Ear: External ear normal.  Eyes:     General: No scleral icterus.       Right eye: No discharge.        Left eye: No discharge.     Conjunctiva/sclera: Conjunctivae normal.  Neck:     Thyroid: No thyromegaly.  Cardiovascular:     Rate and Rhythm: Normal rate and regular rhythm.   Pulmonary:     Effort: No respiratory distress.     Breath sounds: Normal breath sounds. No wheezing.  Abdominal:     General: Bowel sounds are normal.     Palpations: Abdomen is soft.  Tenderness: There is no abdominal tenderness.  Musculoskeletal:        General: No swelling or tenderness.     Cervical back: Neck supple. No tenderness.  Lymphadenopathy:     Cervical: No cervical adenopathy.  Skin:    Findings: No erythema or rash.  Neurological:     Mental Status: She is alert.  Psychiatric:        Mood and Affect: Mood normal.        Behavior: Behavior normal.     Outpatient Encounter Medications as of 05/13/2021  Medication Sig   acetaminophen (TYLENOL) 325 MG tablet Take 650 mg by mouth 4 (four) times daily as needed.   apixaban (ELIQUIS) 5 MG TABS tablet Take 1 tablet (5 mg total) by mouth 2 (two) times daily.   cetirizine (ZYRTEC) 10 MG tablet Take 10 mg by mouth every evening.   diltiazem (CARDIZEM CD) 240 MG 24 hr capsule Take 1 capsule (240 mg total) by mouth daily.   ferrous sulfate 325 (65 FE) MG tablet Take 325 mg by mouth in the morning.   levothyroxine (SYNTHROID) 75 MCG tablet Take 1 tablet (75 mcg total) by mouth daily at 6 (six) AM.   losartan (COZAAR) 25 MG tablet Take 0.5 tablets (12.5 mg total) by mouth in the morning.   magnesium oxide (MAG-OX) 400 MG tablet Take 1 tablet (400 mg total) by mouth 2 (two) times daily.   metoprolol tartrate (LOPRESSOR) 100 MG tablet Take 1 tablet (100 mg total) by mouth 2 (two) times daily.   Multiple Vitamin (MULTIVITAMIN WITH MINERALS) TABS tablet Take 1 tablet by mouth in the morning.   omeprazole (PRILOSEC) 20 MG capsule TAKE 1 CAPSULE TWICE DAILY BEFORE MEALS   potassium chloride (KLOR-CON) 10 MEQ tablet Take 1 tablet (10 mEq total) by mouth daily.   rosuvastatin (CRESTOR) 5 MG tablet Take 1 tablet (5 mg total) by mouth every evening.   sertraline (ZOLOFT) 50 MG tablet TAKE 1 TABLET DAILY   spironolactone (ALDACTONE)  25 MG tablet Take 1 tablet (25 mg total) by mouth daily.   torsemide (DEMADEX) 20 MG tablet Take 1-2 tablets (20-40 mg total) by mouth daily as needed (fluid retention.).   [DISCONTINUED] losartan (COZAAR) 25 MG tablet Take 12.5 mg by mouth in the morning.   [DISCONTINUED] rosuvastatin (CRESTOR) 5 MG tablet Take 5 mg by mouth every evening.   [DISCONTINUED] spironolactone (ALDACTONE) 25 MG tablet Take 25 mg by mouth daily.   No facility-administered encounter medications on file as of 05/13/2021.     Lab Results  Component Value Date   WBC 11.4 (H) 03/30/2021   HGB 10.3 (L) 04/02/2021   HCT 32.4 (L) 04/02/2021   PLT 316.0 03/30/2021   GLUCOSE 114 (H) 05/20/2021   CHOL 192 01/12/2021   TRIG 105.0 01/12/2021   HDL 77.10 01/12/2021   LDLDIRECT 131.0 03/19/2013   LDLCALC 94 01/12/2021   ALT 15 03/13/2021   AST 16 03/13/2021   NA 135 05/20/2021   K 4.7 05/20/2021   CL 99 05/20/2021   CREATININE 1.25 (H) 05/20/2021   BUN 20 05/20/2021   CO2 32 05/20/2021   TSH 3.68 04/14/2021   INR 1.2 03/12/2021   HGBA1C 5.9 01/12/2021       Assessment & Plan:   Problem List Items Addressed This Visit     Atrial fibrillation ([place])    Atrial fib noted in hospital.  Had f/u with cardiology - cardizem increased to 272m q day.  Off digoxin.  Rated controlled.  On eliquis.  Follow.       Relevant Medications   spironolactone (ALDACTONE) 25 MG tablet   losartan (COZAAR) 25 MG tablet   rosuvastatin (CRESTOR) 5 MG tablet   [place] ulcer, chronic    Found on EGD.  PPI bid.  Arrange f/u with GI.  On eliquis now for recent PE and afib.        Relevant Orders   Ambulatory referral to Gastroenterology   GERD (gastroesophageal reflux disease)    Continue prilosec.  No upper symptoms.        Hiatal hernia with GERD and esophagitis    Found to have large hiatal hernia and ulcer with no active bleeding.  Saw GI.  Recommended PPI bid.  Discussed with GI.  Had recommended surgery evaluation. Pt  declines at this time.  Follow.       Relevant Orders   Ambulatory referral to Gastroenterology   Hypercholesterolemia    Continue lovastatin.  Low cholesterol diet and exercise.  Follow lipid panel and liver function tests.       Relevant Medications   spironolactone (ALDACTONE) 25 MG tablet   losartan (COZAAR) 25 MG tablet   rosuvastatin (CRESTOR) 5 MG tablet   Hypertension - Primary    Currently on cardizem and lopressor.  Torsemide was added prior to discharge.  Blood pressure controlled.  Check metabolic panel.  Off torsemide now for two weeks.        Relevant Medications   spironolactone (ALDACTONE) 25 MG tablet   losartan (COZAAR) 25 MG tablet   rosuvastatin (CRESTOR) 5 MG tablet   Other Relevant Orders   Basic metabolic panel (Completed)   Hypothyroidism    On synthroid.  Follow tsh.       Iron deficiency anemia    S/p venofer 03/15/21.  Follow cbc.        Relevant Orders   Ambulatory referral to Gastroenterology   Prediabetes    Low carb diet and exercise.  Follow met b and a1c.   Lab Results  Component Value Date   HGBA1C 5.9 01/12/2021       Pulmonary embolism ([place])    Recent CT showing PE.  On eliquis.  Continue eliquis.  Will need to monitor with history of GI bleed.  Recent echo with evidence of pulmonary hypertension.   May be related to PE.  Pulmonary also arranging for HST.        Relevant Medications   spironolactone (ALDACTONE) 25 MG tablet   losartan (COZAAR) 25 MG tablet   rosuvastatin (CRESTOR) 5 MG tablet   Stress    Continue zoloft.       Other Visit Diagnoses     Hyperkalemia       Relevant Orders   Potassium        Einar Pheasant, MD

## 2021-05-14 ENCOUNTER — Ambulatory Visit: Payer: Medicare HMO | Admitting: Internal Medicine

## 2021-05-20 ENCOUNTER — Other Ambulatory Visit: Payer: Self-pay

## 2021-05-20 ENCOUNTER — Other Ambulatory Visit (INDEPENDENT_AMBULATORY_CARE_PROVIDER_SITE_OTHER): Payer: Medicare HMO

## 2021-05-20 DIAGNOSIS — E875 Hyperkalemia: Secondary | ICD-10-CM

## 2021-05-20 DIAGNOSIS — R944 Abnormal results of kidney function studies: Secondary | ICD-10-CM

## 2021-05-21 LAB — BASIC METABOLIC PANEL
BUN: 20 mg/dL (ref 6–23)
CO2: 32 mEq/L (ref 19–32)
Calcium: 9.9 mg/dL (ref 8.4–10.5)
Chloride: 99 mEq/L (ref 96–112)
Creatinine, Ser: 1.25 mg/dL — ABNORMAL HIGH (ref 0.40–1.20)
GFR: 42.2 mL/min — ABNORMAL LOW (ref >60.00)
Glucose, Bld: 114 mg/dL — ABNORMAL HIGH (ref 70–99)
Potassium: 4.7 mEq/L (ref 3.5–5.1)
Sodium: 135 mEq/L (ref 135–145)

## 2021-05-23 ENCOUNTER — Encounter: Payer: Self-pay | Admitting: Internal Medicine

## 2021-05-23 NOTE — Assessment & Plan Note (Signed)
Found to have large hiatal hernia and ulcer with no active bleeding.  Saw GI.  Recommended PPI bid.  Discussed with GI.  Had recommended surgery evaluation. Pt declines at this time.  Follow.

## 2021-05-23 NOTE — Assessment & Plan Note (Signed)
Recent CT showing PE.  On eliquis.  Continue eliquis.  Will need to monitor with history of GI bleed.  Recent echo with evidence of pulmonary hypertension.   May be related to PE.  Pulmonary also arranging for HST.

## 2021-05-23 NOTE — Assessment & Plan Note (Signed)
Continue lovastatin.  Low cholesterol diet and exercise.  Follow lipid panel and liver function tests.  

## 2021-05-23 NOTE — Assessment & Plan Note (Signed)
Continue prilosec.  No upper symptoms.   

## 2021-05-23 NOTE — Assessment & Plan Note (Signed)
Atrial fib noted in hospital.  Had f/u with cardiology - cardizem increased to 240mg  q day.  Off digoxin.  Rated controlled.  On eliquis.  Follow.

## 2021-05-23 NOTE — Assessment & Plan Note (Signed)
S/p venofer 03/15/21.  Follow cbc.

## 2021-05-23 NOTE — Assessment & Plan Note (Signed)
Found on EGD.  PPI bid.  Arrange f/u with GI.  On eliquis now for recent PE and afib.

## 2021-05-23 NOTE — Assessment & Plan Note (Signed)
Currently on cardizem and lopressor.  Torsemide was added prior to discharge.  Blood pressure controlled.  Check metabolic panel.  Off torsemide now for two weeks.

## 2021-05-23 NOTE — Assessment & Plan Note (Signed)
-

## 2021-05-23 NOTE — Assessment & Plan Note (Signed)
Low carb diet and exercise.  Follow met b and a1c.   Lab Results  Component Value Date   HGBA1C 5.9 01/12/2021

## 2021-05-23 NOTE — Assessment & Plan Note (Signed)
On synthroid.  Follow tsh.   

## 2021-05-27 ENCOUNTER — Inpatient Hospital Stay: Admission: RE | Admit: 2021-05-27 | Payer: Medicare HMO | Source: Ambulatory Visit

## 2021-05-31 ENCOUNTER — Other Ambulatory Visit: Payer: Self-pay

## 2021-05-31 ENCOUNTER — Ambulatory Visit: Payer: Medicare HMO | Admitting: Cardiovascular Disease

## 2021-05-31 ENCOUNTER — Encounter: Payer: Self-pay | Admitting: Cardiovascular Disease

## 2021-05-31 VITALS — BP 140/80 | HR 49 | Ht 63.0 in | Wt 212.1 lb

## 2021-05-31 DIAGNOSIS — I10 Essential (primary) hypertension: Secondary | ICD-10-CM | POA: Diagnosis not present

## 2021-05-31 DIAGNOSIS — I2699 Other pulmonary embolism without acute cor pulmonale: Secondary | ICD-10-CM

## 2021-05-31 DIAGNOSIS — I5033 Acute on chronic diastolic (congestive) heart failure: Secondary | ICD-10-CM

## 2021-05-31 DIAGNOSIS — E7800 Pure hypercholesterolemia, unspecified: Secondary | ICD-10-CM | POA: Diagnosis not present

## 2021-05-31 DIAGNOSIS — I4819 Other persistent atrial fibrillation: Secondary | ICD-10-CM

## 2021-05-31 MED ORDER — TORSEMIDE 20 MG PO TABS: ORAL_TABLET | ORAL | 1 refills | Status: DC

## 2021-05-31 MED ORDER — POTASSIUM CHLORIDE ER 10 MEQ PO CPCR: ORAL_CAPSULE | ORAL | 3 refills | Status: DC

## 2021-05-31 NOTE — Patient Instructions (Addendum)
Medication Instructions:  Torsemide weight scale Weight <205  no torsemide Weight 205-211  take torsemide 20 mg daily and potassium 10 meq Weight >212  take torsemide 40 mg daily with potassium 20 mEq    If you need a refill on your cardiac medications before your next appointment, please call your pharmacy.   Lab work: No new labs needed  Testing/Procedures: No new testing needed  Follow-Up: At [facility], you and your health needs are our priority.  As part of our continuing mission to provide you with exceptional heart care, we have created designated Provider Care Teams.  These Care Teams include your primary Cardiologist (physician) and Advanced Practice Providers (APPs -  Physician Assistants and Nurse Practitioners) who all work together to provide you with the care you need, when you need it.  You will need a follow up appointment in 6 months  Providers on your designated Care Team:   Murray Hodgkins, NP Christell Faith, PA-C Cadence Kathlen Mody, [place]  COVID-19 Vaccine Information can be found at: [email or web address] For questions related to vaccine distribution or appointments, please email [email or web address] or call [phone].

## 2021-05-31 NOTE — Progress Notes (Signed)
Cardiology Office Note  Date:  05/31/2021   ID:  Amanda, Browning 1945-10-28, MRN [id number]  PCP:  Einar Pheasant, MD   Chief Complaint  Patient presents with   1 month follow up     Patient c/o fatigue, shortness of breath with little to no exertion and LE edema. Medications reviewed by the patient verbally.     HPI:  Amanda Browning is a 76 y.o. female with a hx of hypertension, hyperlipidemia  Admission to [facility] 11/22 for GI bleed,  A. fib RVR , acute diastolic CHF Started on Eliquis March 26, 2021 Torsemide dose decreased for renal dysfunction Admission to [facility] diastolic CHF requiring IV Lasix, discharged on torsemide 40 daily, BNP 411, had 11 L diuresis 15 pounds down Baseline weight 205 pounds Echocardiogram on 04/21/21 showed new RV dilation and systolic dysfunction. CTA showed acute right lower lobe pulmonary embolism. Who presents for routine follow-up of her diastolic CHF, afib  LOV 1/0/25 Cardioversion 04/29/21  Prior to cardioversion was taking torsemide 40 mg daily In follow-up today reports she is not taking any torsemide Has some weight gain several pounds, worsening of her leg swelling Trying to moderate her fluid intake Has appreciated low heart rate at home reports she is asymptomatic, typically running in the 50 range  Lab work reviewed HGB 12.9 CR 1.2  EKG personally reviewed by myself on todays visit Sinus bradycardia rate 49 bpm no significant ST-T wave changes   Prior records from November 2022 reviewed GI bleeding 4 days prior to admission symptoms of hypotensive, weakness Hemoglobin 7.2 on arrival, was transfused EGD November 28, ulcers noted, no active bleeding, started on PPI twice daily    PMH:   has a past medical history of Anemia, Chicken pox, Colon polyps, Complication of anesthesia, DDD (degenerative disc disease), cervical (02/05/13), GERD (gastroesophageal reflux disease), History of chicken pox, Hypercholesterolemia,  Hyperlipidemia, Hypertension, Hypothyroidism, Migraines, PONV (postoperative nausea and vomiting), and Squamous cell skin cancer.  PSH:    Past Surgical History:  Procedure Laterality Date   BREAST BIOPSY Right 2012   stereotactic   CARDIOVERSION N/A 04/29/2021   Procedure: CARDIOVERSION;  Surgeon: [name], MD;  Location: [facility] ORS;  Service: Cardiovascular;  Laterality: N/A;   COLONOSCOPY WITH PROPOFOL N/A 02/23/2018   Procedure: COLONOSCOPY WITH PROPOFOL;  Surgeon: Manya Silvas, MD;  Location: [facility] ENDOSCOPY;  Service: Endoscopy;  Laterality: N/A;   ESOPHAGOGASTRODUODENOSCOPY N/A 03/15/2021   Procedure: ESOPHAGOGASTRODUODENOSCOPY (EGD);  Surgeon: Lin Landsman, MD;  Location: [facility] ENDOSCOPY;  Service: Gastroenterology;  Laterality: N/A;   KNEE ARTHROPLASTY Right 04/17/2017   Procedure: COMPUTER ASSISTED TOTAL KNEE ARTHROPLASTY;  Surgeon: Dereck Leep, MD;  Location: [facility] ORS;  Service: Orthopedics;  Laterality: Right;   SKIN BIOPSY     VAGINAL HYSTERECTOMY  1994   ovaries not removed   WISDOM TOOTH EXTRACTION      Current Outpatient Medications  Medication Sig Dispense Refill   acetaminophen (TYLENOL) 325 MG tablet Take 650 mg by mouth 4 (four) times daily as needed.     apixaban (ELIQUIS) 5 MG TABS tablet Take 1 tablet (5 mg total) by mouth 2 (two) times daily. 60 tablet 3   cetirizine (ZYRTEC) 10 MG tablet Take 10 mg by mouth every evening.     diltiazem (CARDIZEM CD) 240 MG 24 hr capsule Take 1 capsule (240 mg total) by mouth daily. 90 capsule 1   ferrous sulfate 325 (65 FE) MG tablet Take 325 mg by mouth in  the morning.     levothyroxine (SYNTHROID) 75 MCG tablet Take 1 tablet (75 mcg total) by mouth daily at 6 (six) AM. 90 tablet 0   losartan (COZAAR) 25 MG tablet Take 0.5 tablets (12.5 mg total) by mouth in the morning. 45 tablet 2   magnesium oxide (MAG-OX) 400 MG tablet Take 1 tablet (400 mg total) by mouth 2 (two) times daily. 60 tablet 2   metoprolol  tartrate (LOPRESSOR) 100 MG tablet Take 1 tablet (100 mg total) by mouth 2 (two) times daily. 180 tablet 0   Multiple Vitamin (MULTIVITAMIN WITH MINERALS) TABS tablet Take 1 tablet by mouth in the morning.     omeprazole (PRILOSEC) 20 MG capsule TAKE 1 CAPSULE TWICE DAILY BEFORE MEALS 180 capsule 1   rosuvastatin (CRESTOR) 5 MG tablet Take 1 tablet (5 mg total) by mouth every evening. 90 tablet 3   sertraline (ZOLOFT) 50 MG tablet TAKE 1 TABLET DAILY 90 tablet 1   spironolactone (ALDACTONE) 25 MG tablet Take 1 tablet (25 mg total) by mouth daily. 90 tablet 2   potassium chloride (MICRO-K) 10 MEQ CR capsule Take 10 mEq with torsemide 20 mg, take 20 Meq with torsemide 40 mg 90 capsule 3   torsemide (DEMADEX) 20 MG tablet Weight <205 no torsemide Weight 205-211 take torsemide 20 mg daily and potassium 10 meq Weight >212 take torsemide 40 mg daily with potassium 20 mEq 60 tablet 1   No current facility-administered medications for this visit.     Allergies:   Codeine   Social History:  The patient  reports that she has never smoked. She has never used smokeless tobacco. She reports that she does not drink alcohol and does not use drugs.   Family History:   family history includes Breast cancer (age of onset: 55) in her sister; Cancer in her sister; Diabetes type II in her brother; Heart attack in her father and mother; Heart disease in her father and mother; Hyperlipidemia in her father; Hypertension in her brother, father, and mother; Ovarian cancer in her paternal aunt; Stroke in her father and mother.    Review of Systems: Review of Systems  Constitutional: Negative.   HENT: Negative.    Respiratory: Negative.    Cardiovascular: Negative.   Gastrointestinal: Negative.   Musculoskeletal: Negative.   Neurological: Negative.   Psychiatric/Behavioral: Negative.    All other systems reviewed and are negative.   PHYSICAL EXAM: VS:  BP 140/80 (BP Location: Left Arm, Patient Position:  Sitting, Cuff Size: Normal)    Pulse (!) 49    Ht 5\' 3"  (1.6 m)    Wt 212 lb 2 oz (96.2 kg)    SpO2 96%    BMI 37.58 kg/m  , BMI Body mass index is 37.58 kg/m. Constitutional:  oriented to person, place, and time. No distress.  HENT:  Head: Grossly normal Eyes:  no discharge. No scleral icterus.  Neck: No JVD, no carotid bruits  Cardiovascular: Regular rate and rhythm, no murmurs appreciated Pulmonary/Chest: Clear to auscultation bilaterally, no wheezes or rails Abdominal: Soft.  no distension.  no tenderness.  Musculoskeletal: Normal range of motion Neurological:  normal muscle tone. Coordination normal. No atrophy Skin: Skin warm and dry Psychiatric: normal affect, pleasant  Recent Labs: 03/13/2021: ALT 15 03/19/2021: Magnesium 2.0 03/30/2021: Platelets 316.0 04/02/2021: Hemoglobin 10.3 04/14/2021: TSH 3.68 05/20/2021: BUN 20; Creatinine, Ser 1.25; Potassium 4.7; Sodium 135    Lipid Panel Lab Results  Component Value Date   CHOL 192 01/12/2021  HDL 77.10 01/12/2021   LDLCALC 94 01/12/2021   TRIG 105.0 01/12/2021      Wt Readings from Last 3 Encounters:  05/31/21 212 lb 2 oz (96.2 kg)  05/13/21 209 lb (94.8 kg)  04/29/21 205 lb (93 kg)     ASSESSMENT AND PLAN:  Problem List Items Addressed This Visit       Cardiology Problems   Hypertension - Primary   Relevant Medications   torsemide (DEMADEX) 20 MG tablet   Other Relevant Orders   EKG 12-Lead   Hypercholesterolemia   Relevant Medications   torsemide (DEMADEX) 20 MG tablet   Atrial fibrillation (HCC)   Relevant Medications   torsemide (DEMADEX) 20 MG tablet   Pulmonary embolism (HCC)   Relevant Medications   torsemide (DEMADEX) 20 MG tablet   Acute on chronic diastolic CHF (congestive heart failure) (HCC)   Relevant Medications   torsemide (DEMADEX) 20 MG tablet  Persistent atrial fibrillation Successful cardioversion, maintaining normal sinus rhythm Recommend she continue Eliquis 5 twice daily,  carvedilol 12.5 twice daily She will call us if heart rate was consistently in the 40 range, may need to decrease diltiazem dosing or metoprolol  Chronic diastolic CHF Given weight gain, leg swelling, recommend she follow algorithm as detailed below Weight <205   no torsemide Weight 205-211   take torsemide 20 mg daily and potassium 10 meq Weight >212  take torsemide 40 mg daily with potassium 20 mEq  Essential hypertension Blood pressure is well controlled on today's visit. No changes made to the medications.  Long discussion concerning management of CHF and atrial fibrillation  Total encounter time more than 40 minutes  Greater than 50% was spent in counseling and coordination of care with the patient    Signed, Esmond Plants, M.D., Ph.D. [place], [place]

## 2021-06-01 ENCOUNTER — Ambulatory Visit: Payer: Medicare HMO | Attending: Pulmonary Disease

## 2021-06-01 DIAGNOSIS — G4710 Hypersomnia, unspecified: Secondary | ICD-10-CM | POA: Diagnosis not present

## 2021-06-01 DIAGNOSIS — R0683 Snoring: Secondary | ICD-10-CM | POA: Diagnosis not present

## 2021-06-01 DIAGNOSIS — G4733 Obstructive sleep apnea (adult) (pediatric): Secondary | ICD-10-CM | POA: Diagnosis not present

## 2021-06-03 ENCOUNTER — Telehealth (INDEPENDENT_AMBULATORY_CARE_PROVIDER_SITE_OTHER): Payer: Medicare HMO | Admitting: Pulmonary Disease

## 2021-06-03 ENCOUNTER — Other Ambulatory Visit: Payer: Self-pay

## 2021-06-03 DIAGNOSIS — G4733 Obstructive sleep apnea (adult) (pediatric): Secondary | ICD-10-CM

## 2021-06-03 NOTE — Telephone Encounter (Signed)
Will go over at ov on 06/11/21 .

## 2021-06-03 NOTE — Telephone Encounter (Signed)
NPSG  showed mod  OSA with AHI 25/ hr , predominantly when supine Doubt positional therapy alone would suffice  Suggest autoCPAP  5-15 cm, mask of choice

## 2021-06-04 DIAGNOSIS — G4733 Obstructive sleep apnea (adult) (pediatric): Secondary | ICD-10-CM | POA: Diagnosis not present

## 2021-06-04 NOTE — Telephone Encounter (Signed)
Spoke to patient's daughter, [name](DPR) and confirmed upcoming visit. Nothing further needed.

## 2021-06-11 ENCOUNTER — Telehealth: Payer: Self-pay

## 2021-06-11 ENCOUNTER — Ambulatory Visit (INDEPENDENT_AMBULATORY_CARE_PROVIDER_SITE_OTHER): Payer: Medicare HMO | Admitting: Adult Health

## 2021-06-11 ENCOUNTER — Other Ambulatory Visit: Payer: Self-pay

## 2021-06-11 ENCOUNTER — Encounter: Payer: Self-pay | Admitting: Adult Health

## 2021-06-11 VITALS — BP 132/78 | HR 56 | Temp 98.1°F | Ht 63.0 in | Wt 207.0 lb

## 2021-06-11 DIAGNOSIS — G4733 Obstructive sleep apnea (adult) (pediatric): Secondary | ICD-10-CM | POA: Diagnosis not present

## 2021-06-11 NOTE — Assessment & Plan Note (Signed)
Moderate to severe OSA -patient education on sleep apnea and CPAP Begin CPAP AutoSet 5-15 with mask of choice.  - discussed how weight can impact sleep and risk for sleep disordered breathing - discussed options to assist with weight loss: combination of diet modification, cardiovascular and strength training exercises   - had an extensive discussion regarding the adverse health consequences related to untreated sleep disordered breathing - specifically discussed the risks for hypertension, coronary artery disease, cardiac dysrhythmias, cerebrovascular disease, and diabetes - lifestyle modification discussed   - discussed how sleep disruption can increase risk of accidents, particularly when driving - safe driving practices were discussed    Plan  Patient Instructions  Begin CPAP at bedtime Goal was to wear your CPAP all night long.  Need to at least wear it for 4 to 6 hours or more Work on healthy weight loss Do not drive if sleepy Follow-up in 3 months and as needed with Dr. Mortimer Fries or Royal Piedra NP

## 2021-06-11 NOTE — Assessment & Plan Note (Addendum)
Morbid obesity with associated sleep apnea. Healthy weight loss discussed

## 2021-06-11 NOTE — Patient Instructions (Signed)
Begin CPAP at bedtime Goal was to wear your CPAP all night long.  Need to at least wear it for 4 to 6 hours or more Work on healthy weight loss Do not drive if sleepy Follow-up in 3 months and as needed with Dr. Mortimer Fries or Royal Piedra NP

## 2021-06-11 NOTE — Progress Notes (Signed)
@Patient  ID: Amanda Browning, female    DOB: [birth year], 76 y.o.   MRN: [id number]  Chief Complaint  Patient presents with   Follow-up    Referring provider: Einar Pheasant, MD  HPI: 76 year old female seen for sleep consult April 26, 2021 for daytime sleepiness found to have moderate to severe sleep apnea Medical history significant for GI bleed, atrial fibrillation, hyperlipidemia, GERD, hiatal hernia and hypertension  TEST/EVENTS :  [facility] sleep study June 01, 2021-AHI 25.2/hour SPO2 low at 80%, supine AHI 58.3/hour  06/11/2021 Follow up : OSA  Patient returns for a 6-week follow-up.  Patient was seen last visit for a sleep consult for daytime sleepiness and snoring.  She was referred by her cardiologist.  She has a history of atrial fibrillation.  Patient was set up for a in lab split-night sleep study completed on June 01, 2021 that showed moderate to severe sleep apnea with AHI at 25.2/hour and SPO2 low at 80%.  Supine AHI was 58.3/hour.  Most events occurred during supine sleeping. We discussed her sleep study results.  Went over treatment options including weight loss, oral appliance and CPAP.  We went over the potential complications of untreated sleep apnea.  Patient elected proceed with CPAP therapy.  Allergies  Allergen Reactions   Codeine Other (See Comments)    Altered mental status- "Cannot function"    Immunization History  Administered Date(s) Administered   Fluad Quad(high Dose 65+) 02/12/2019, 01/07/2020, 01/12/2021   Influenza Split 04/03/2012   Influenza, High Dose Seasonal PF 01/20/2017, 01/31/2018   Influenza,inj,Quad PF,6+ Mos 01/15/2013, 01/23/2014, 12/10/2014   Pneumococcal Conjugate-13 01/23/2014   Pneumococcal Polysaccharide-23 01/15/2013    Past Medical History:  Diagnosis Date   Anemia    Chicken pox    Colon polyps    Complication of anesthesia    doesn't take much anesthesia to put pt to sleep, slow to wake up   DDD (degenerative  disc disease), cervical 02/05/13   C5-C6 and C6-C7   GERD (gastroesophageal reflux disease)    History of chicken pox    Hypercholesterolemia    Hyperlipidemia    Hypertension    Hypothyroidism    Migraines    PONV (postoperative nausea and vomiting)    Squamous cell skin cancer     Tobacco History: Social History   Tobacco Use  Smoking Status Never  Smokeless Tobacco Never   Counseling given: Not Answered   Outpatient Medications Prior to Visit  Medication Sig Dispense Refill   acetaminophen (TYLENOL) 325 MG tablet Take 650 mg by mouth 4 (four) times daily as needed.     apixaban (ELIQUIS) 5 MG TABS tablet Take 1 tablet (5 mg total) by mouth 2 (two) times daily. 60 tablet 3   cetirizine (ZYRTEC) 10 MG tablet Take 10 mg by mouth every evening.     diltiazem (CARDIZEM CD) 240 MG 24 hr capsule Take 1 capsule (240 mg total) by mouth daily. 90 capsule 1   ferrous sulfate 325 (65 FE) MG tablet Take 325 mg by mouth in the morning.     levothyroxine (SYNTHROID) 75 MCG tablet Take 1 tablet (75 mcg total) by mouth daily at 6 (six) AM. 90 tablet 0   losartan (COZAAR) 25 MG tablet Take 0.5 tablets (12.5 mg total) by mouth in the morning. 45 tablet 2   magnesium oxide (MAG-OX) 400 MG tablet Take 1 tablet (400 mg total) by mouth 2 (two) times daily. 60 tablet 2   metoprolol tartrate (LOPRESSOR) 100  MG tablet Take 1 tablet (100 mg total) by mouth 2 (two) times daily. 180 tablet 0   Multiple Vitamin (MULTIVITAMIN WITH MINERALS) TABS tablet Take 1 tablet by mouth in the morning.     omeprazole (PRILOSEC) 20 MG capsule TAKE 1 CAPSULE TWICE DAILY BEFORE MEALS 180 capsule 1   potassium chloride (MICRO-K) 10 MEQ CR capsule Take 10 mEq with torsemide 20 mg, take 20 Meq with torsemide 40 mg 90 capsule 3   rosuvastatin (CRESTOR) 5 MG tablet Take 1 tablet (5 mg total) by mouth every evening. 90 tablet 3   sertraline (ZOLOFT) 50 MG tablet TAKE 1 TABLET DAILY 90 tablet 1   spironolactone (ALDACTONE) 25  MG tablet Take 1 tablet (25 mg total) by mouth daily. 90 tablet 2   torsemide (DEMADEX) 20 MG tablet Weight <205 no torsemide Weight 205-211 take torsemide 20 mg daily and potassium 10 meq Weight >212 take torsemide 40 mg daily with potassium 20 mEq 60 tablet 1   No facility-administered medications prior to visit.     Review of Systems:   Constitutional:   No  weight loss, night sweats,  Fevers, chills,  +fatigue, or  lassitude.  HEENT:   No headaches,  Difficulty swallowing,  Tooth/dental problems, or  Sore throat,                No sneezing, itching, ear ache, nasal congestion, post nasal drip,   CV:  No chest pain,  Orthopnea, PND, swelling in lower extremities, anasarca, dizziness, palpitations, syncope.   GI  No heartburn, indigestion, abdominal pain, nausea, vomiting, diarrhea, change in bowel habits, loss of appetite, bloody stools.   Resp:   No chest wall deformity  Skin: no rash or lesions.  GU: no dysuria, change in color of urine, no urgency or frequency.  No flank pain, no hematuria   MS:  No joint pain or swelling.  No decreased range of motion.  No back pain.    Physical Exam  BP 132/78 (BP Location: Left Arm, Cuff Size: Normal)    Pulse (!) 56    Temp 98.1 F (36.7 C) (Temporal)    Ht 5\' 3"  (1.6 m)    Wt 207 lb (93.9 kg)    SpO2 95%    BMI 36.67 kg/m   GEN: A/Ox3; pleasant , NAD, well nourished    HEENT:  [place]/AT,  NOSE-clear, THROAT-clear, no lesions, no postnasal drip or exudate noted. Class 2-3 MP airway   NECK:  Supple w/ fair ROM; no JVD; normal carotid impulses w/o bruits; no thyromegaly or nodules palpated; no lymphadenopathy.    RESP  Clear  P & A; w/o, wheezes/ rales/ or rhonchi. no accessory muscle use, no dullness to percussion  CARD:  RRR, no m/r/g, no peripheral edema, pulses intact, no cyanosis or clubbing.  GI:   Soft & nt; nml bowel sounds; no organomegaly or masses detected.   Musco: Warm bil, no deformities or joint swelling noted.    Neuro: alert, no focal deficits noted.    Skin: Warm, no lesions or rashes    Lab Results:  CBC   BMET   BNP No results found for: BNP  ProBNP No results found for: PROBNP  Imaging: SLEEP STUDY DOCUMENTS  Result Date: 06/04/2021 Ordered by an unspecified provider.  SLEEP STUDY DOCUMENTS  Result Date: 06/01/2021 Ordered by an unspecified provider.     No flowsheet data found.  No results found for: NITRICOXIDE      Assessment &  Plan:   OSA (obstructive sleep apnea) Moderate to severe OSA -patient education on sleep apnea and CPAP Begin CPAP AutoSet 5-15 with mask of choice.  - discussed how weight can impact sleep and risk for sleep disordered breathing - discussed options to assist with weight loss: combination of diet modification, cardiovascular and strength training exercises   - had an extensive discussion regarding the adverse health consequences related to untreated sleep disordered breathing - specifically discussed the risks for hypertension, coronary artery disease, cardiac dysrhythmias, cerebrovascular disease, and diabetes - lifestyle modification discussed   - discussed how sleep disruption can increase risk of accidents, particularly when driving - safe driving practices were discussed    Plan  Patient Instructions  Begin CPAP at bedtime Goal was to wear your CPAP all night long.  Need to at least wear it for 4 to 6 hours or more Work on healthy weight loss Do not drive if sleepy Follow-up in 3 months and as needed with Dr. Mortimer Fries or [place] NP       Morbid obesity ([place]) Morbid obesity with associated sleep apnea. Healthy weight loss discussed      Rexene Edison, NP 06/11/2021

## 2021-06-11 NOTE — Telephone Encounter (Signed)
Split night sleep study was ordered 04/26/2021, however it does not look like split night was performed. It looks like poly was preformed only.   Lm for michelle with [facility].

## 2021-06-15 NOTE — Telephone Encounter (Signed)
Lm x2 for [facility].  Will call once more.

## 2021-06-16 NOTE — Telephone Encounter (Signed)
Lm x3 for [facility].  ? ?

## 2021-06-29 ENCOUNTER — Other Ambulatory Visit: Payer: Self-pay

## 2021-06-29 ENCOUNTER — Ambulatory Visit (INDEPENDENT_AMBULATORY_CARE_PROVIDER_SITE_OTHER): Payer: Medicare HMO | Admitting: Internal Medicine

## 2021-06-29 VITALS — BP 136/74 | HR 60 | Temp 97.8°F | Resp 16 | Ht 63.0 in | Wt 209.0 lb

## 2021-06-29 DIAGNOSIS — I4819 Other persistent atrial fibrillation: Secondary | ICD-10-CM | POA: Diagnosis not present

## 2021-06-29 DIAGNOSIS — I5033 Acute on chronic diastolic (congestive) heart failure: Secondary | ICD-10-CM

## 2021-06-29 DIAGNOSIS — R051 Acute cough: Secondary | ICD-10-CM | POA: Diagnosis not present

## 2021-06-29 DIAGNOSIS — I10 Essential (primary) hypertension: Secondary | ICD-10-CM

## 2021-06-29 MED ORDER — PREDNISONE 10 MG PO TABS: ORAL_TABLET | ORAL | 0 refills | Status: DC

## 2021-06-29 MED ORDER — CEFDINIR 300 MG PO CAPS: 300.0000 mg | ORAL_CAPSULE | Freq: Two times a day (BID) | ORAL | 0 refills | Status: DC

## 2021-06-29 NOTE — Progress Notes (Signed)
Patient ID: Amanda Browning, female   DOB: [birth year], 76 y.o.   MRN: [id number] ? ? ?Subjective:  ? ? Patient ID: Amanda Browning, female    DOB: 11/16/1945, 76 y.o.   MRN: [id number] ? ?This visit occurred during the SARS-CoV-2 public health emergency.  Safety protocols were in place, including screening questions prior to the visit, additional usage of staff PPE, and extensive cleaning of exam room while observing appropriate contact time as indicated for disinfecting solutions.  ? ?Patient here for work in appt.  ? ?Chief Complaint  ?Patient presents with  ? Cough  ? .  ? ?HPI ?Work in - reports starting one week ago - scratching throat.  Increased nasal congestion and chest congestion.  Increased cough.  No fever.  No headache reported.  Denies any chest pain or increased sob.  States her breathing has been doing well.  No nausea or vomiting.  No diarrhea reported.  Using nasal spray.   ? ? ?Past Medical History:  ?Diagnosis Date  ? Anemia   ? Chicken pox   ? Colon polyps   ? Complication of anesthesia   ? doesn't take much anesthesia to put pt to sleep, slow to wake up  ? DDD (degenerative disc disease), cervical 02/05/13  ? C5-C6 and C6-C7  ? GERD (gastroesophageal reflux disease)   ? History of chicken pox   ? Hypercholesterolemia   ? Hyperlipidemia   ? Hypertension   ? Hypothyroidism   ? Migraines   ? PONV (postoperative nausea and vomiting)   ? Squamous cell skin cancer   ? ?Past Surgical History:  ?Procedure Laterality Date  ? BREAST BIOPSY Right 2012  ? stereotactic  ? CARDIOVERSION N/A 04/29/2021  ? Procedure: CARDIOVERSION;  Surgeon: [name], MD;  Location: [facility] ORS;  Service: Cardiovascular;  Laterality: N/A;  ? COLONOSCOPY WITH PROPOFOL N/A 02/23/2018  ? Procedure: COLONOSCOPY WITH PROPOFOL;  Surgeon: Manya Silvas, MD;  Location: [facility] ENDOSCOPY;  Service: Endoscopy;  Laterality: N/A;  ? ESOPHAGOGASTRODUODENOSCOPY N/A 03/15/2021  ? Procedure: ESOPHAGOGASTRODUODENOSCOPY (EGD);  Surgeon:  Lin Landsman, MD;  Location: [facility] ENDOSCOPY;  Service: Gastroenterology;  Laterality: N/A;  ? KNEE ARTHROPLASTY Right 04/17/2017  ? Procedure: COMPUTER ASSISTED TOTAL KNEE ARTHROPLASTY;  Surgeon: Dereck Leep, MD;  Location: [facility] ORS;  Service: Orthopedics;  Laterality: Right;  ? SKIN BIOPSY    ? VAGINAL HYSTERECTOMY  1994  ? ovaries not removed  ? WISDOM TOOTH EXTRACTION    ? ?Family History  ?Problem Relation Age of Onset  ? Heart disease Mother   ? Stroke Mother   ? Hypertension Mother   ? Heart attack Mother   ? Heart disease Father   ?     enlarged heart  ? Stroke Father   ? Hypertension Father   ? Hyperlipidemia Father   ? Heart attack Father   ? Ovarian cancer Paternal Aunt   ? Breast cancer Sister 34  ? Cancer Sister   ?     Breast Cancer  ? Diabetes type II Brother   ? Hypertension Brother   ? Colon cancer Neg Hx   ? Basal cell carcinoma Neg Hx   ? Melanoma Neg Hx   ? Squamous cell carcinoma Neg Hx   ? ?Social History  ? ?Socioeconomic History  ? Marital status: Single  ?  Spouse name: Not on file  ? Number of children: 2  ? Years of education: 32  ? Highest education level: High school graduate  ?  Occupational History  ? Not on file  ?Tobacco Use  ? Smoking status: Never  ? Smokeless tobacco: Never  ?Vaping Use  ? Vaping Use: Never used  ?Substance and Sexual Activity  ? Alcohol use: No  ?  Alcohol/week: 0.0 standard drinks  ? Drug use: No  ? Sexual activity: Never  ?Other Topics Concern  ? Not on file  ?Social History Narrative  ? Full Code  ? Alcohol - none  ? Never smoker and no smokeless tobacco  ? 2 daughters  ? ?Social Determinants of Health  ? ?Financial Resource Strain: Not on file  ?Food Insecurity: Not on file  ?Transportation Needs: Not on file  ?Physical Activity: Not on file  ?Stress: Not on file  ?Social Connections: Not on file  ? ? ? ?Review of Systems  ?Constitutional:  Negative for appetite change and fever.  ?HENT:  Positive for congestion and postnasal drip.   ?Respiratory:   Positive for cough. Negative for chest tightness and shortness of breath.   ?Cardiovascular:  Negative for chest pain, palpitations and leg swelling.  ?Gastrointestinal:  Negative for abdominal pain, diarrhea, nausea and vomiting.  ?Genitourinary:  Negative for difficulty urinating and dysuria.  ?Musculoskeletal:  Negative for joint swelling and myalgias.  ?Skin:  Negative for color change and rash.  ?Neurological:  Negative for dizziness and headaches.  ?Psychiatric/Behavioral:  Negative for agitation and dysphoric mood.   ? ?   ?Objective:  ?  ? ?BP 136/74   Pulse 60   Temp 97.8 ?F (36.6 ?C)   Resp 16   Ht '5\' 3"'$  (1.6 m)   Wt 209 lb (94.8 kg)   SpO2 96%   BMI 37.02 kg/m?  ?Wt Readings from Last 3 Encounters:  ?06/29/21 209 lb (94.8 kg)  ?06/11/21 207 lb (93.9 kg)  ?05/31/21 212 lb 2 oz (96.2 kg)  ? ? ?Physical Exam ?Vitals reviewed.  ?Constitutional:   ?   General: She is not in acute distress. ?   Appearance: Normal appearance.  ?HENT:  ?   Head: Normocephalic and atraumatic.  ?   Right Ear: External ear normal.  ?   Left Ear: External ear normal.  ?Eyes:  ?   General: No scleral icterus.    ?   Right eye: No discharge.     ?   Left eye: No discharge.  ?   Conjunctiva/sclera: Conjunctivae normal.  ?Neck:  ?   Thyroid: No thyromegaly.  ?Cardiovascular:  ?   Rate and Rhythm: Normal rate and regular rhythm.  ?Pulmonary:  ?   Effort: No respiratory distress.  ?   Breath sounds: Normal breath sounds. No wheezing.  ?Abdominal:  ?   General: Bowel sounds are normal.  ?   Palpations: Abdomen is soft.  ?   Tenderness: There is no abdominal tenderness.  ?Musculoskeletal:     ?   General: No swelling or tenderness.  ?   Cervical back: Neck supple. No tenderness.  ?Lymphadenopathy:  ?   Cervical: No cervical adenopathy.  ?Skin: ?   Findings: No erythema or rash.  ?Neurological:  ?   Mental Status: She is alert.  ?Psychiatric:     ?   Mood and Affect: Mood normal.     ?   Behavior: Behavior normal.  ? ? ? ?Outpatient  Encounter Medications as of 06/29/2021  ?Medication Sig  ? acetaminophen (TYLENOL) 325 MG tablet Take 650 mg by mouth 4 (four) times daily as needed.  ? apixaban (ELIQUIS)  5 MG TABS tablet Take 1 tablet (5 mg total) by mouth 2 (two) times daily.  ? cefdinir (OMNICEF) 300 MG capsule Take 1 capsule (300 mg total) by mouth 2 (two) times daily.  ? cetirizine (ZYRTEC) 10 MG tablet Take 10 mg by mouth every evening.  ? diltiazem (CARDIZEM CD) 240 MG 24 hr capsule Take 1 capsule (240 mg total) by mouth daily.  ? ferrous sulfate 325 (65 FE) MG tablet Take 325 mg by mouth in the morning.  ? levothyroxine (SYNTHROID) 75 MCG tablet Take 1 tablet (75 mcg total) by mouth daily at 6 (six) AM.  ? losartan (COZAAR) 25 MG tablet Take 0.5 tablets (12.5 mg total) by mouth in the morning.  ? magnesium oxide (MAG-OX) 400 MG tablet Take 1 tablet (400 mg total) by mouth 2 (two) times daily.  ? metoprolol tartrate (LOPRESSOR) 100 MG tablet Take 1 tablet (100 mg total) by mouth 2 (two) times daily.  ? Multiple Vitamin (MULTIVITAMIN WITH MINERALS) TABS tablet Take 1 tablet by mouth in the morning.  ? omeprazole (PRILOSEC) 20 MG capsule TAKE 1 CAPSULE TWICE DAILY BEFORE MEALS  ? potassium chloride (MICRO-K) 10 MEQ CR capsule Take 10 mEq with torsemide 20 mg, take 20 Meq with torsemide 40 mg  ? predniSONE (DELTASONE) 10 MG tablet Take 4 tablets x 1 day and then decrease by 1/2 tablet per day until down to zero mg.  ? rosuvastatin (CRESTOR) 5 MG tablet Take 1 tablet (5 mg total) by mouth every evening.  ? sertraline (ZOLOFT) 50 MG tablet TAKE 1 TABLET DAILY  ? spironolactone (ALDACTONE) 25 MG tablet Take 1 tablet (25 mg total) by mouth daily.  ? torsemide (DEMADEX) 20 MG tablet Weight <205 no torsemide Weight 205-211 take torsemide 20 mg daily and potassium 10 meq Weight >212 take torsemide 40 mg daily with potassium 20 mEq  ? ?No facility-administered encounter medications on file as of 06/29/2021.  ?  ? ?Lab Results  ?Component Value Date  ?  WBC 11.4 (H) 03/30/2021  ? HGB 10.3 (L) 04/02/2021  ? HCT 32.4 (L) 04/02/2021  ? PLT 316.0 03/30/2021  ? GLUCOSE 114 (H) 05/20/2021  ? CHOL 192 01/12/2021  ? TRIG 105.0 01/12/2021  ? HDL 77.10 01/12/2021  ? [place]

## 2021-06-30 LAB — NOVEL CORONAVIRUS, NAA: SARS-CoV-2, NAA: NOT DETECTED

## 2021-07-04 ENCOUNTER — Encounter: Payer: Self-pay | Admitting: Internal Medicine

## 2021-07-04 DIAGNOSIS — R059 Cough, unspecified: Secondary | ICD-10-CM | POA: Insufficient documentation

## 2021-07-04 NOTE — Assessment & Plan Note (Signed)
Cough and congestion as outlined.  No chest pain or sob.  Continue nasal spray.  Treat with omnicef.  Probiotic as directed.  Prednisone taper as directed.  Follow.  Call with update.  Nasal swab to check for covid.  Quarantine until results available.  Follow.  ?

## 2021-07-04 NOTE — Assessment & Plan Note (Signed)
Rate controlled.  On eliquis.  Continues on diltiazem and metoprolol.  ?

## 2021-07-04 NOTE — Assessment & Plan Note (Addendum)
Currently on cardizem and lopressor.  Torsemide. Blood pressure controlled.  Check metabolic panel.  ?

## 2021-07-04 NOTE — Assessment & Plan Note (Signed)
Monitoring her weight.  Breathing stable.  Torsemide.  Follow.  ?

## 2021-07-14 ENCOUNTER — Other Ambulatory Visit: Payer: Self-pay

## 2021-07-14 MED ORDER — APIXABAN 5 MG PO TABS: 5.0000 mg | ORAL_TABLET | Freq: Two times a day (BID) | ORAL | 6 refills | Status: DC

## 2021-07-14 NOTE — Telephone Encounter (Signed)
Eliquis 5 mg refill request received. Patient is 76 years old, weight- 94.8 kg, Crea- 1.25 on 05/20/21, Diagnosis- afib, and last seen by Dr. Rockey Situ on 05/31/21. Dose is appropriate based on dosing criteria. Will send in refill to requested pharmacy.   ?

## 2021-07-19 ENCOUNTER — Other Ambulatory Visit: Payer: Self-pay | Admitting: Internal Medicine

## 2021-07-26 ENCOUNTER — Other Ambulatory Visit: Payer: Self-pay

## 2021-07-26 MED ORDER — LEVOTHYROXINE SODIUM 75 MCG PO TABS: 75.0000 ug | ORAL_TABLET | Freq: Every day | ORAL | 0 refills | Status: DC

## 2021-08-06 DIAGNOSIS — S0501XA Injury of conjunctiva and corneal abrasion without foreign body, right eye, initial encounter: Secondary | ICD-10-CM | POA: Diagnosis not present

## 2021-08-06 DIAGNOSIS — Z0100 Encounter for examination of eyes and vision without abnormal findings: Secondary | ICD-10-CM | POA: Diagnosis not present

## 2021-08-09 DIAGNOSIS — T1501XA Foreign body in cornea, right eye, initial encounter: Secondary | ICD-10-CM | POA: Diagnosis not present

## 2021-08-11 DIAGNOSIS — S0501XD Injury of conjunctiva and corneal abrasion without foreign body, right eye, subsequent encounter: Secondary | ICD-10-CM | POA: Diagnosis not present

## 2021-08-16 ENCOUNTER — Ambulatory Visit: Payer: Medicare HMO | Admitting: Internal Medicine

## 2021-08-18 DIAGNOSIS — T1501XD Foreign body in cornea, right eye, subsequent encounter: Secondary | ICD-10-CM | POA: Diagnosis not present

## 2021-08-23 DIAGNOSIS — H2512 Age-related nuclear cataract, left eye: Secondary | ICD-10-CM | POA: Diagnosis not present

## 2021-08-24 ENCOUNTER — Encounter: Payer: Self-pay | Admitting: Ophthalmology

## 2021-09-02 DIAGNOSIS — H169 Unspecified keratitis: Secondary | ICD-10-CM | POA: Diagnosis not present

## 2021-09-02 NOTE — Discharge Instructions (Signed)

## 2021-09-06 ENCOUNTER — Encounter
Admission: RE | Disposition: A | Discharge status: Home / Self Care | Payer: Self-pay | Source: Home / Self Care | Attending: Ophthalmology

## 2021-09-06 ENCOUNTER — Ambulatory Visit
Admission: RE | Admit: 2021-09-06 | Discharge: 2021-09-06 | Disposition: A | Discharge status: Home / Self Care | Payer: Medicare HMO | Attending: Ophthalmology | Admitting: Ophthalmology

## 2021-09-06 ENCOUNTER — Ambulatory Visit: Payer: Medicare HMO | Admitting: Anesthesiology

## 2021-09-06 ENCOUNTER — Other Ambulatory Visit: Payer: Self-pay

## 2021-09-06 DIAGNOSIS — H2512 Age-related nuclear cataract, left eye: Secondary | ICD-10-CM | POA: Diagnosis not present

## 2021-09-06 DIAGNOSIS — Z833 Family history of diabetes mellitus: Secondary | ICD-10-CM | POA: Diagnosis not present

## 2021-09-06 DIAGNOSIS — Z6837 Body mass index (BMI) 37.0-37.9, adult: Secondary | ICD-10-CM | POA: Diagnosis not present

## 2021-09-06 DIAGNOSIS — E039 Hypothyroidism, unspecified: Secondary | ICD-10-CM | POA: Insufficient documentation

## 2021-09-06 DIAGNOSIS — I4891 Unspecified atrial fibrillation: Secondary | ICD-10-CM | POA: Insufficient documentation

## 2021-09-06 DIAGNOSIS — Z8249 Family history of ischemic heart disease and other diseases of the circulatory system: Secondary | ICD-10-CM | POA: Insufficient documentation

## 2021-09-06 DIAGNOSIS — K219 Gastro-esophageal reflux disease without esophagitis: Secondary | ICD-10-CM | POA: Diagnosis not present

## 2021-09-06 DIAGNOSIS — E669 Obesity, unspecified: Secondary | ICD-10-CM | POA: Diagnosis not present

## 2021-09-06 DIAGNOSIS — R7303 Prediabetes: Secondary | ICD-10-CM | POA: Diagnosis not present

## 2021-09-06 DIAGNOSIS — D649 Anemia, unspecified: Secondary | ICD-10-CM | POA: Diagnosis not present

## 2021-09-06 DIAGNOSIS — I10 Essential (primary) hypertension: Secondary | ICD-10-CM | POA: Insufficient documentation

## 2021-09-06 DIAGNOSIS — H25812 Combined forms of age-related cataract, left eye: Secondary | ICD-10-CM | POA: Diagnosis not present

## 2021-09-06 HISTORY — PX: CATARACT EXTRACTION W/PHACO: SHX586

## 2021-09-06 HISTORY — DX: Personal history of other diseases of the circulatory system: Z86.79

## 2021-09-06 HISTORY — DX: Cardiac murmur, unspecified: R01.1

## 2021-09-06 HISTORY — DX: Sleep apnea, unspecified: G47.30

## 2021-09-06 SURGERY — PHACOEMULSIFICATION, CATARACT, WITH IOL INSERTION
Anesthesia: Monitor Anesthesia Care | Site: Eye | Laterality: Left

## 2021-09-06 MED ORDER — TETRACAINE HCL 0.5 % OP SOLN
1.0000 [drp] | OPHTHALMIC | Status: DC | PRN
  Administered 2021-09-06 (×3): 1 [drp] via OPHTHALMIC

## 2021-09-06 MED ORDER — MIDAZOLAM HCL 2 MG/2ML IJ SOLN
INTRAMUSCULAR | Status: DC | PRN
  Administered 2021-09-06: 1 mg via INTRAVENOUS

## 2021-09-06 MED ORDER — FENTANYL CITRATE (PF) 100 MCG/2ML IJ SOLN
INTRAMUSCULAR | Status: DC | PRN
  Administered 2021-09-06: 50 ug via INTRAVENOUS

## 2021-09-06 MED ORDER — SIGHTPATH DOSE#1 SODIUM HYALURONATE 23 MG/ML IO SOLUTION
PREFILLED_SYRINGE | INTRAOCULAR | Status: DC | PRN
  Administered 2021-09-06: 0.6 mL via INTRAOCULAR

## 2021-09-06 MED ORDER — LIDOCAINE HCL (PF) 2 % IJ SOLN
INTRAOCULAR | Status: DC | PRN
  Administered 2021-09-06: 1 mL via INTRAOCULAR

## 2021-09-06 MED ORDER — MOXIFLOXACIN HCL 0.5 % OP SOLN
OPHTHALMIC | Status: DC | PRN
Start: 2021-09-06 — End: 2021-09-06
  Administered 2021-09-06: 0.2 mL via OPHTHALMIC

## 2021-09-06 MED ORDER — SIGHTPATH DOSE#1 BSS IO SOLN
INTRAOCULAR | Status: DC | PRN
  Administered 2021-09-06: 77 mL via OPHTHALMIC

## 2021-09-06 MED ORDER — LACTATED RINGERS IV SOLN: INTRAVENOUS | Status: DC

## 2021-09-06 MED ORDER — SIGHTPATH DOSE#1 SODIUM HYALURONATE 10 MG/ML IO SOLUTION
PREFILLED_SYRINGE | INTRAOCULAR | Status: DC | PRN
  Administered 2021-09-06: 0.85 mL via INTRAOCULAR

## 2021-09-06 MED ORDER — SIGHTPATH DOSE#1 BSS IO SOLN
INTRAOCULAR | Status: DC | PRN
  Administered 2021-09-06: 15 mL

## 2021-09-06 MED ORDER — ARMC OPHTHALMIC DILATING DROPS
1.0000 "application " | OPHTHALMIC | Status: DC | PRN
  Administered 2021-09-06 (×3): 1 via OPHTHALMIC

## 2021-09-06 SURGICAL SUPPLY — 14 items
CATARACT SUITE SIGHTPATH (MISCELLANEOUS) ×2 IMPLANT
DISSECTOR HYDRO NUCLEUS 50X22 (MISCELLANEOUS) ×2 IMPLANT
FEE CATARACT SUITE SIGHTPATH (MISCELLANEOUS) ×1 IMPLANT
GLOVE SURG GAMMEX PI TX LF 7.5 (GLOVE) ×2 IMPLANT
GLOVE SURG SYN 8.5  E (GLOVE) ×2
GLOVE SURG SYN 8.5 E (GLOVE) ×1 IMPLANT
GLOVE SURG SYN 8.5 PF PI (GLOVE) ×1 IMPLANT
LENS IOL TECNIS EYHANCE 20.5 (Intraocular Lens) ×1 IMPLANT
NDL FILTER BLUNT 18X1 1/2 (NEEDLE) ×1 IMPLANT
NEEDLE FILTER BLUNT 18X 1/2SAF (NEEDLE) ×1
NEEDLE FILTER BLUNT 18X1 1/2 (NEEDLE) ×1 IMPLANT
SYR 3ML LL SCALE MARK (SYRINGE) ×2 IMPLANT
SYR 5ML LL (SYRINGE) ×2 IMPLANT
WATER STERILE IRR 250ML POUR (IV SOLUTION) ×2 IMPLANT

## 2021-09-06 NOTE — Anesthesia Preprocedure Evaluation (Signed)
Anesthesia Evaluation  Patient identified by MRN, date of birth, ID band Patient awake    Reviewed: Allergy & Precautions, NPO status , Patient's Chart, lab work & pertinent test results  History of Anesthesia Complications (+) PONV and history of anesthetic complications (slow to wake, sensitive)  Airway Mallampati: IV   Neck ROM: Full    Dental   Missing many teeth:   Pulmonary neg pulmonary ROS,    Pulmonary exam normal breath sounds clear to auscultation       Cardiovascular hypertension, + dysrhythmias (a fib) Atrial Fibrillation  Rhythm:Regular Rate:Normal  ECG 03/14/21:  Atrial fibrillation (HR 101) Low voltage, precordial leads Prolonged QT interval  Echo 03/15/21:  EF 55-60% Mild LVH Moderately elevated pulmonary artery systolic pressure Moderate dilation of LA and RA Mild MR, TR   Neuro/Psych negative neurological ROS     GI/Hepatic hiatal hernia, GERD  Medicated,GI bleed   Endo/Other  Hypothyroidism Prediabetes; obesity  Renal/GU negative Renal ROS     Musculoskeletal   Abdominal   Peds  Hematology  (+) Blood dyscrasia, anemia ,   Anesthesia Other Findings   Reproductive/Obstetrics                             Anesthesia Physical  Anesthesia Plan  ASA: 3  Anesthesia Plan: MAC   Post-op Pain Management:    Induction: Intravenous  PONV Risk Score and Plan: 2 and TIVA, Treatment may vary due to age or medical condition and Midazolam  Airway Management Planned: Natural Airway  Additional Equipment:   Intra-op Plan:   Post-operative Plan:   Informed Consent: I have reviewed the patients History and Physical, chart, labs and discussed the procedure including the risks, benefits and alternatives for the proposed anesthesia with the patient or authorized representative who has indicated his/her understanding and acceptance.       Plan Discussed with:  CRNA  Anesthesia Plan Comments: (LMA/GETA backup discussed.  Patient consented for risks of anesthesia including but not limited to:  - adverse reactions to medications - damage to eyes, teeth, lips or other oral mucosa - nerve damage due to positioning  - sore throat or hoarseness - damage to heart, brain, nerves, lungs, other parts of body or loss of life  Informed patient about role of CRNA in peri- and intra-operative care.  Patient voiced understanding.)        Anesthesia Quick Evaluation

## 2021-09-06 NOTE — H&P (Signed)
[facility]   Primary Care Physician:  Einar Pheasant, MD Ophthalmologist: Dr. Benay Pillow  Pre-Procedure History & Physical: HPI:  Amanda Browning is a 76 y.o. female here for cataract surgery.   Past Medical History:  Diagnosis Date   Anemia    Chicken pox    Colon polyps    Complication of anesthesia    doesn't take much anesthesia to put pt to sleep, slow to wake up   DDD (degenerative disc disease), cervical 02/05/2013   C5-C6 and C6-C7   GERD (gastroesophageal reflux disease)    Heart murmur    History of atrial fibrillation    History of chicken pox    Hypercholesterolemia    Hyperlipidemia    Hypertension    Hypothyroidism    Migraines    PONV (postoperative nausea and vomiting)    Sleep apnea    Squamous cell skin cancer     Past Surgical History:  Procedure Laterality Date   BREAST BIOPSY Right 2012   stereotactic   CARDIOVERSION N/A 04/29/2021   Procedure: CARDIOVERSION;  Surgeon: [name], MD;  Location: [facility] ORS;  Service: Cardiovascular;  Laterality: N/A;   COLONOSCOPY WITH PROPOFOL N/A 02/23/2018   Procedure: COLONOSCOPY WITH PROPOFOL;  Surgeon: Manya Silvas, MD;  Location: [facility] ENDOSCOPY;  Service: Endoscopy;  Laterality: N/A;   ESOPHAGOGASTRODUODENOSCOPY N/A 03/15/2021   Procedure: ESOPHAGOGASTRODUODENOSCOPY (EGD);  Surgeon: Lin Landsman, MD;  Location: [facility] ENDOSCOPY;  Service: Gastroenterology;  Laterality: N/A;   KNEE ARTHROPLASTY Right 04/17/2017   Procedure: COMPUTER ASSISTED TOTAL KNEE ARTHROPLASTY;  Surgeon: Dereck Leep, MD;  Location: [facility] ORS;  Service: Orthopedics;  Laterality: Right;   SKIN BIOPSY     VAGINAL HYSTERECTOMY  1994   ovaries not removed   WISDOM TOOTH EXTRACTION      Prior to Admission medications   Medication Sig Start Date End Date Taking? Authorizing Provider  acetaminophen (TYLENOL) 325 MG tablet Take 650 mg by mouth 4 (four) times daily as needed.   Yes [provider]   apixaban (ELIQUIS) 5 MG TABS tablet Take 1 tablet (5 mg total) by mouth 2 (two) times daily. 07/14/21  Yes [name], MD  cetirizine (ZYRTEC) 10 MG tablet Take 10 mg by mouth every evening.   Yes [provider]  diltiazem (CARDIZEM CD) 240 MG 24 hr capsule Take 1 capsule (240 mg total) by mouth daily. 05/04/21  Yes Einar Pheasant, MD  ferrous sulfate 325 (65 FE) MG tablet Take 325 mg by mouth in the morning.   Yes [provider]  levothyroxine (SYNTHROID) 75 MCG tablet Take 1 tablet (75 mcg total) by mouth daily at 6 (six) AM. 07/26/21  Yes Einar Pheasant, MD  losartan (COZAAR) 25 MG tablet Take 0.5 tablets (12.5 mg total) by mouth in the morning. 05/13/21  Yes Einar Pheasant, MD  magnesium oxide (MAG-OX) 400 MG tablet Take 1 tablet (400 mg total) by mouth 2 (two) times daily. 01/24/17  Yes Einar Pheasant, MD  metoprolol tartrate (LOPRESSOR) 100 MG tablet TAKE 1 TABLET TWICE DAILY 07/19/21  Yes Einar Pheasant, MD  Multiple Vitamin (MULTIVITAMIN WITH MINERALS) TABS tablet Take 1 tablet by mouth in the morning.   Yes [provider]  omeprazole (PRILOSEC) 20 MG capsule TAKE 1 CAPSULE TWICE DAILY BEFORE MEALS 05/12/21  Yes Einar Pheasant, MD  potassium chloride (MICRO-K) 10 MEQ CR capsule Take 10 mEq with torsemide 20 mg, take 20 Meq with torsemide 40 mg 05/31/21  Yes  [name], MD  rosuvastatin (CRESTOR) 5 MG tablet Take 1 tablet (5 mg total) by mouth every evening. 05/13/21  Yes Einar Pheasant, MD  sertraline (ZOLOFT) 50 MG tablet TAKE 1 TABLET DAILY 05/12/21  Yes Einar Pheasant, MD  spironolactone (ALDACTONE) 25 MG tablet Take 1 tablet (25 mg total) by mouth daily. 05/13/21  Yes Einar Pheasant, MD  torsemide (DEMADEX) 20 MG tablet Weight <205 no torsemide Weight 205-211 take torsemide 20 mg daily and potassium 10 meq Weight >212 take torsemide 40 mg daily with potassium 20 mEq 05/31/21  Yes Gollan, Kathlene November, MD  cefdinir (OMNICEF) 300 MG capsule Take 1  capsule (300 mg total) by mouth 2 (two) times daily. Patient not taking: Reported on 08/24/2021 06/29/21   Einar Pheasant, MD  predniSONE (DELTASONE) 10 MG tablet Take 4 tablets x 1 day and then decrease by 1/2 tablet per day until down to zero mg. Patient not taking: Reported on 08/24/2021 06/29/21   Einar Pheasant, MD    Allergies as of 08/19/2021 - Review Complete 07/04/2021  Allergen Reaction Noted   Codeine Other (See Comments) 03/19/2012    Family History  Problem Relation Age of Onset   Heart disease Mother    Stroke Mother    Hypertension Mother    Heart attack Mother    Heart disease Father        enlarged heart   Stroke Father    Hypertension Father    Hyperlipidemia Father    Heart attack Father    Ovarian cancer Paternal Aunt    Breast cancer Sister 55   Cancer Sister        Breast Cancer   Diabetes type II Brother    Hypertension Brother    Colon cancer Neg Hx    Basal cell carcinoma Neg Hx    Melanoma Neg Hx    Squamous cell carcinoma Neg Hx     Social History   Socioeconomic History   Marital status: Divorced    Spouse name: Not on file   Number of children: 2   Years of education: 12   Highest education level: High school graduate  Occupational History   Not on file  Tobacco Use   Smoking status: Never   Smokeless tobacco: Never  Vaping Use   Vaping Use: Never used  Substance and Sexual Activity   Alcohol use: No    Alcohol/week: 0.0 standard drinks   Drug use: No   Sexual activity: Never  Other Topics Concern   Not on file  Social History Narrative   Full Code   Alcohol - none   Never smoker and no smokeless tobacco   2 daughters   Social Determinants of Health   Financial Resource Strain: Not on file  Food Insecurity: Not on file  Transportation Needs: Not on file  Physical Activity: Not on file  Stress: Not on file  Social Connections: Not on file  Intimate Partner Violence: Not on file    Review of Systems: See HPI, otherwise  negative ROS  Physical Exam: BP (!) 176/97   Pulse (!) 52   Temp 97.7 F (36.5 C) (Temporal)   Ht '5\' 3"'$  (1.6 m)   Wt 96.2 kg   SpO2 93%   BMI 37.55 kg/m  General:   Alert, cooperative in NAD Head:  Normocephalic and atraumatic. Respiratory:  Normal work of breathing. Cardiovascular:  RRR  Impression/Plan: Amanda Browning is here for cataract surgery.  Risks, benefits, limitations, and alternatives  regarding cataract surgery have been reviewed with the patient.  Questions have been answered.  All parties agreeable.   Benay Pillow, MD  09/06/2021, 12:22 PM

## 2021-09-06 NOTE — Op Note (Signed)
OPERATIVE NOTE  Amanda Browning [id number] 09/06/2021   PREOPERATIVE DIAGNOSIS:  Nuclear sclerotic cataract left eye.  H25.12   POSTOPERATIVE DIAGNOSIS:    Nuclear sclerotic cataract left eye.     PROCEDURE:  Phacoemusification with posterior chamber intraocular lens placement of the left eye   LENS:   Implant Name Type Inv. Item Serial No. Manufacturer Lot No. LRB No. Used Action  LENS IOL TECNIS EYHANCE 20.5 - [id number] Intraocular Lens LENS IOL TECNIS EYHANCE 20.5 [id number] [employer]  Left 1 Implanted      Procedure(s): CATARACT EXTRACTION PHACO AND INTRAOCULAR LENS PLACEMENT (IOC) LEFT 6.60 00:43.8 (Left)  DIB00 +20.5   ULTRASOUND TIME: 0 minutes 43 seconds.  CDE 6.60   SURGEON:  Benay Pillow, MD, MPH   ANESTHESIA:  Topical with tetracaine drops augmented with 1% preservative-free intracameral lidocaine.  ESTIMATED BLOOD LOSS: <1 mL   COMPLICATIONS:  None.   DESCRIPTION OF PROCEDURE:  The patient was identified in the holding room and transported to the operating room and placed in the supine position under the operating microscope.  The left eye was identified as the operative eye and it was prepped and draped in the usual sterile ophthalmic fashion.   A 1.0 millimeter clear-corneal paracentesis was made at the 5:00 position. 0.5 ml of preservative-free 1% lidocaine with epinephrine was injected into the anterior chamber.  The anterior chamber was filled with Healon 5 viscoelastic.  A 2.4 millimeter keratome was used to make a near-clear corneal incision at the 2:00 position.  A curvilinear capsulorrhexis was made with a cystotome and capsulorrhexis forceps.  Balanced salt solution was used to hydrodissect and hydrodelineate the nucleus.   Phacoemulsification was then used in stop and chop fashion to remove the lens nucleus and epinucleus.  The remaining cortex was then removed using the irrigation and aspiration handpiece. Healon was then placed into the capsular bag to  distend it for lens placement.  A lens was then injected into the capsular bag.  The remaining viscoelastic was aspirated.   Wounds were hydrated with balanced salt solution.  The anterior chamber was inflated to a physiologic pressure with balanced salt solution.  Intracameral vigamox 0.1 mL undiltued was injected into the eye and a drop placed onto the ocular surface.  No wound leaks were noted.  The patient was taken to the recovery room in stable condition without complications of anesthesia or surgery  Benay Pillow 09/06/2021, 12:49 PM

## 2021-09-06 NOTE — Anesthesia Procedure Notes (Signed)
Procedure Name: MAC Date/Time: 09/06/2021 12:35 PM Performed by: Cameron Ali, CRNA Pre-anesthesia Checklist: Patient identified, Emergency Drugs available, Suction available, Timeout performed and Patient being monitored Patient Re-evaluated:Patient Re-evaluated prior to induction Oxygen Delivery Method: Nasal cannula Placement Confirmation: positive ETCO2

## 2021-09-06 NOTE — Anesthesia Postprocedure Evaluation (Signed)
Anesthesia Post Note  Patient: Amanda Browning  Procedure(s) Performed: CATARACT EXTRACTION PHACO AND INTRAOCULAR LENS PLACEMENT (IOC) LEFT 6.60 00:43.8 (Left: Eye)     Patient location during evaluation: PACU Anesthesia Type: MAC Level of consciousness: awake and alert Pain management: pain level controlled Vital Signs Assessment: post-procedure vital signs reviewed and stable Respiratory status: spontaneous breathing, nonlabored ventilation, respiratory function stable and patient connected to nasal cannula oxygen Cardiovascular status: stable and blood pressure returned to baseline Postop Assessment: no apparent nausea or vomiting Anesthetic complications: no   No notable events documented.  April Manson

## 2021-09-06 NOTE — Transfer of Care (Signed)
Immediate Anesthesia Transfer of Care Note  Patient: Amanda Browning  Procedure(s) Performed: CATARACT EXTRACTION PHACO AND INTRAOCULAR LENS PLACEMENT (IOC) LEFT 6.60 00:43.8 (Left: Eye)  Patient Location: PACU  Anesthesia Type: MAC  Level of Consciousness: awake, alert  and patient cooperative  Airway and Oxygen Therapy: Patient Spontanous Breathing and Patient connected to supplemental oxygen  Post-op Assessment: Post-op Vital signs reviewed, Patient's Cardiovascular Status Stable, Respiratory Function Stable, Patent Airway and No signs of Nausea or vomiting  Post-op Vital Signs: Reviewed and stable  Complications: No notable events documented.

## 2021-09-07 ENCOUNTER — Encounter: Payer: Self-pay | Admitting: Ophthalmology

## 2021-09-14 ENCOUNTER — Encounter: Payer: Self-pay | Admitting: Internal Medicine

## 2021-09-14 ENCOUNTER — Ambulatory Visit (INDEPENDENT_AMBULATORY_CARE_PROVIDER_SITE_OTHER): Payer: Medicare HMO | Admitting: Internal Medicine

## 2021-09-14 VITALS — BP 132/72 | HR 76 | Temp 98.4°F | Resp 15 | Ht 63.0 in | Wt 209.8 lb

## 2021-09-14 DIAGNOSIS — K257 Chronic gastric ulcer without hemorrhage or perforation: Secondary | ICD-10-CM

## 2021-09-14 DIAGNOSIS — I4819 Other persistent atrial fibrillation: Secondary | ICD-10-CM

## 2021-09-14 DIAGNOSIS — K449 Diaphragmatic hernia without obstruction or gangrene: Secondary | ICD-10-CM | POA: Diagnosis not present

## 2021-09-14 DIAGNOSIS — E039 Hypothyroidism, unspecified: Secondary | ICD-10-CM | POA: Diagnosis not present

## 2021-09-14 DIAGNOSIS — D649 Anemia, unspecified: Secondary | ICD-10-CM

## 2021-09-14 DIAGNOSIS — I10 Essential (primary) hypertension: Secondary | ICD-10-CM

## 2021-09-14 DIAGNOSIS — F439 Reaction to severe stress, unspecified: Secondary | ICD-10-CM

## 2021-09-14 DIAGNOSIS — R7303 Prediabetes: Secondary | ICD-10-CM | POA: Diagnosis not present

## 2021-09-14 DIAGNOSIS — K219 Gastro-esophageal reflux disease without esophagitis: Secondary | ICD-10-CM

## 2021-09-14 DIAGNOSIS — R739 Hyperglycemia, unspecified: Secondary | ICD-10-CM | POA: Diagnosis not present

## 2021-09-14 DIAGNOSIS — E7800 Pure hypercholesterolemia, unspecified: Secondary | ICD-10-CM | POA: Diagnosis not present

## 2021-09-14 DIAGNOSIS — G4733 Obstructive sleep apnea (adult) (pediatric): Secondary | ICD-10-CM | POA: Diagnosis not present

## 2021-09-14 NOTE — Progress Notes (Addendum)
Patient ID: Amanda Browning, female   DOB: [birth year], 76 y.o.   MRN: [id number]   Subjective:    Patient ID: Amanda Browning, female    DOB: February 04, 1946, 76 y.o.   MRN: [id number]   Patient here for a scheduled follow up.   Chief Complaint  Patient presents with   Follow-up   Hypertension   .   HPI Reports she is doing relatively well.  States outside blood pressures averaging 127-134/60-70.  No chest pain.  No sob.  Breathing better.  Is s/p cataract surgery. Went well.  Taking omeprazole 65m bid.  Also taking aloe.  Controlling upper GI symptoms.  Has seen Dr VMarius Ditch  Previous EGD -  Large hiatal hernia with multiple Cameron ulcers. Biopsied. - LA Grade D reflux esophagitis with no bleeding. States was mentioned about referral to surgery - regarding hernia.  Request f/u with Dr VMarius Ditch  Occasional loose stool.  No abdominal pain.  Taking eliquis.  Cost is an issue.    Past Medical History:  Diagnosis Date   Anemia    Chicken pox    Colon polyps    Complication of anesthesia    doesn't take much anesthesia to put pt to sleep, slow to wake up   DDD (degenerative disc disease), cervical 02/05/2013   C5-C6 and C6-C7   GERD (gastroesophageal reflux disease)    Heart murmur    History of atrial fibrillation    History of chicken pox    Hypercholesterolemia    Hyperlipidemia    Hypertension    Hypothyroidism    Migraines    PONV (postoperative nausea and vomiting)    Sleep apnea    Squamous cell skin cancer    Past Surgical History:  Procedure Laterality Date   BREAST BIOPSY Right 2012   stereotactic   CARDIOVERSION N/A 04/29/2021   Procedure: CARDIOVERSION;  Surgeon: GMinna Merritts MD;  Location: [facility];  Service: Cardiovascular;  Laterality: N/A;   CATARACT EXTRACTION W/PHACO Left 09/06/2021   Procedure: CATARACT EXTRACTION PHACO AND INTRAOCULAR LENS PLACEMENT (IOC) LEFT 6.60 00:43.8;  Surgeon: KEulogio Bear MD;  [place]  Service:  Ophthalmology;  Laterality: Left;   COLONOSCOPY WITH PROPOFOL N/A 02/23/2018   Procedure: COLONOSCOPY WITH PROPOFOL;  Surgeon: EManya Silvas MD;  [facility];  Service: Endoscopy;  Laterality: N/A;   ESOPHAGOGASTRODUODENOSCOPY N/A 03/15/2021   Procedure: ESOPHAGOGASTRODUODENOSCOPY (EGD);  Surgeon: VLin Landsman MD;  [facility];  Service: Gastroenterology;  Laterality: N/A;   KNEE ARTHROPLASTY Right 04/17/2017   Procedure: COMPUTER ASSISTED TOTAL KNEE ARTHROPLASTY;  Surgeon: HDereck Leep MD;  Location: [facility];  Service: Orthopedics;  Laterality: Right;   SKIN BIOPSY     VAGINAL HYSTERECTOMY  1994   ovaries not removed   WISDOM TOOTH EXTRACTION     Family History  Problem Relation Age of Onset   Heart disease Mother    Stroke Mother    Hypertension Mother    Heart attack Mother    Heart disease Father        enlarged heart   Stroke Father    Hypertension Father    Hyperlipidemia Father    Heart attack Father    Ovarian cancer Paternal Aunt    Breast cancer Sister 727  Cancer Sister        Breast Cancer   Diabetes type II Brother    Hypertension Brother    Colon cancer Neg Hx    Basal  cell carcinoma Neg Hx    Melanoma Neg Hx    Squamous cell carcinoma Neg Hx    Social History   Socioeconomic History   Marital status: Divorced    Spouse name: Not on file   Number of children: 2   Years of education: 12   Highest education level: High school graduate  Occupational History   Not on file  Tobacco Use   Smoking status: Never   Smokeless tobacco: Never  Vaping Use   Vaping Use: Never used  Substance and Sexual Activity   Alcohol use: No    Alcohol/week: 0.0 standard drinks   Drug use: No   Sexual activity: Never  Other Topics Concern   Not on file  Social History Narrative   Full Code   Alcohol - none   Never smoker and no smokeless tobacco   2 daughters   Social Determinants of Health   Financial Resource Strain: Not  on file  Food Insecurity: Not on file  Transportation Needs: Not on file  Physical Activity: Not on file  Stress: Not on file  Social Connections: Not on file     Review of Systems  Constitutional:  Negative for appetite change and unexpected weight change.  HENT:  Negative for congestion and sinus pressure.   Respiratory:  Negative for cough, chest tightness and shortness of breath.   Cardiovascular:  Negative for chest pain, palpitations and leg swelling.  Gastrointestinal:  Negative for abdominal pain, nausea and vomiting.       Occasional loose stool.   Genitourinary:  Negative for difficulty urinating and dysuria.  Musculoskeletal:  Negative for joint swelling and myalgias.  Skin:  Negative for color change and rash.  Neurological:  Negative for dizziness, light-headedness and headaches.  Psychiatric/Behavioral:  Negative for agitation and dysphoric mood.       Objective:     BP 132/72 (BP Location: Left Arm, Patient Position: Sitting, Cuff Size: Large)   Pulse 76   Temp 98.4 F (36.9 C) (Temporal)   Resp 15   Ht 5' 3"  (1.6 m)   Wt 209 lb 12.8 oz (95.2 kg)   SpO2 97%   BMI 37.16 kg/m  Wt Readings from Last 3 Encounters:  09/14/21 209 lb 12.8 oz (95.2 kg)  09/06/21 212 lb (96.2 kg)  06/29/21 209 lb (94.8 kg)    Physical Exam Vitals reviewed.  Constitutional:      General: She is not in acute distress.    Appearance: Normal appearance.  HENT:     Head: Normocephalic and atraumatic.     Right Ear: External ear normal.     Left Ear: External ear normal.  Eyes:     General: No scleral icterus.       Right eye: No discharge.        Left eye: No discharge.     Conjunctiva/sclera: Conjunctivae normal.  Neck:     Thyroid: No thyromegaly.  Cardiovascular:     Rate and Rhythm: Normal rate and regular rhythm.  Pulmonary:     Effort: No respiratory distress.     Breath sounds: Normal breath sounds. No wheezing.  Abdominal:     General: Bowel sounds are normal.      Palpations: Abdomen is soft.     Tenderness: There is no abdominal tenderness.  Musculoskeletal:        General: No swelling or tenderness.     Cervical back: Neck supple. No tenderness.  Lymphadenopathy:  Cervical: No cervical adenopathy.  Skin:    Findings: No erythema or rash.  Neurological:     Mental Status: She is alert.  Psychiatric:        Mood and Affect: Mood normal.        Behavior: Behavior normal.     Outpatient Encounter Medications as of 09/14/2021  Medication Sig   acetaminophen (TYLENOL) 325 MG tablet Take 650 mg by mouth 4 (four) times daily as needed.   apixaban (ELIQUIS) 5 MG TABS tablet Take 1 tablet (5 mg total) by mouth 2 (two) times daily.   cetirizine (ZYRTEC) 10 MG tablet Take 10 mg by mouth every evening.   diltiazem (CARDIZEM CD) 240 MG 24 hr capsule Take 1 capsule (240 mg total) by mouth daily.   ferrous sulfate 325 (65 FE) MG tablet Take 325 mg by mouth in the morning.   levothyroxine (SYNTHROID) 75 MCG tablet Take 1 tablet (75 mcg total) by mouth daily at 6 (six) AM.   losartan (COZAAR) 25 MG tablet Take 0.5 tablets (12.5 mg total) by mouth in the morning.   magnesium oxide (MAG-OX) 400 MG tablet Take 1 tablet (400 mg total) by mouth 2 (two) times daily.   metoprolol tartrate (LOPRESSOR) 100 MG tablet TAKE 1 TABLET TWICE DAILY   Multiple Vitamin (MULTIVITAMIN WITH MINERALS) TABS tablet Take 1 tablet by mouth in the morning.   omeprazole (PRILOSEC) 20 MG capsule TAKE 1 CAPSULE TWICE DAILY BEFORE MEALS   potassium chloride (MICRO-K) 10 MEQ CR capsule Take 10 mEq with torsemide 20 mg, take 20 Meq with torsemide 40 mg   rosuvastatin (CRESTOR) 5 MG tablet Take 1 tablet (5 mg total) by mouth every evening.   sertraline (ZOLOFT) 50 MG tablet TAKE 1 TABLET DAILY   spironolactone (ALDACTONE) 25 MG tablet Take 1 tablet (25 mg total) by mouth daily.   torsemide (DEMADEX) 20 MG tablet Weight <205 no torsemide Weight 205-211 take torsemide 20 mg daily and  potassium 10 meq Weight >212 take torsemide 40 mg daily with potassium 20 mEq   [DISCONTINUED] cefdinir (OMNICEF) 300 MG capsule Take 1 capsule (300 mg total) by mouth 2 (two) times daily.   [DISCONTINUED] predniSONE (DELTASONE) 10 MG tablet Take 4 tablets x 1 day and then decrease by 1/2 tablet per day until down to zero mg.   No facility-administered encounter medications on file as of 09/14/2021.     Lab Results  Component Value Date   WBC 11.4 (H) 03/30/2021   HGB 10.3 (L) 04/02/2021   HCT 32.4 (L) 04/02/2021   PLT 316.0 03/30/2021   GLUCOSE 86 09/14/2021   CHOL 214 (H) 09/14/2021   TRIG 124.0 09/14/2021   HDL 75.00 09/14/2021   LDLDIRECT 131.0 03/19/2013   LDLCALC 114 (H) 09/14/2021   ALT 16 09/14/2021   AST 19 09/14/2021   NA 140 09/14/2021   K 4.2 09/14/2021   CL 100 09/14/2021   CREATININE 1.15 09/14/2021   BUN 28 (H) 09/14/2021   CO2 30 09/14/2021   TSH 1.09 09/14/2021   INR 1.2 03/12/2021   HGBA1C 5.5 09/14/2021       Assessment & Plan:   Problem List Items Addressed This Visit     Anemia    Previous admission for GI bleed.  No further bleeding.  EGD as outlined.  On eliquis - afib.  Request f/u with GI as outlined.        Atrial fibrillation ([place])    On eliquis.  Continues on  diltiazem and metoprolol.         Cameron ulcer, chronic    Found on EGD.  PPI bid.  Upper symptoms appear to be controlled. Arrange f/u with GI.  On eliquis now for recent PE and afib.         GERD (gastroesophageal reflux disease)    Continue prilosec.  No upper symptoms.         Hiatal hernia    Found to have large hiatal hernia and ulcer with no active bleeding.  Saw GI.  Recommended PPI bid.  Discussed with GI.  Had recommended surgery evaluation. Pt request f/u with GI to discuss.        Hypercholesterolemia    On crestor.  Low cholesterol diet and exercise.  Follow lipid panel and liver function tests.        Relevant Orders   Lipid Profile (Completed)    Hepatic function panel (Completed)   Hyperglycemia    Low carb diet and exercise.  Follow met b and a1c.   Lab Results  Component Value Date   HGBA1C 5.5 09/14/2021       Relevant Orders   HgB A1c (Completed)   Hypertension - Primary    Currently on cardizem and lopressor.  Torsemide. Blood pressure controlled.  Check metabolic panel.        Relevant Orders   Basic Metabolic Panel (BMET) (Completed)   Hypothyroidism    On synthroid.  Follow tsh.        Relevant Orders   TSH (Completed)   OSA (obstructive sleep apnea)    Moderate to severe OSA.  Seeing pulmonary.  Recommended CPAP (autoset 5-15).         Stress    Doing well.  Continue zoloft.         Referral to CCM team was made today for assistance with management of their chronic conditions.  High risk identifiers were used to determine the eligibility of the chronic conditions for this service. I have discussed the details of Chronic Care Management services with Ms. Belenda Cruise which include the following:  CCM service includes personalized support from designated clinical staff supervised by myself, including individualized plan of care and coordination with other care providers. 24/7 contact phone numbers for assistance for urgent and routine care needs. Service will only be billed when office clinical staff spend 20 minutes or more in a month to coordinate care. Only one practitioner may furnish and bill the service in a calendar month. The patient may stop CCM services at any time (effective at the end of the month) by phone call to the office staff. She has been maintained on eliquis, cozaar, lopressor, aldactone and torsemide given her history of afib, CHF and hypertension.  Given the expense of these medications, we discussed CCM referral for pt assistance.  Pt agreeable for referral.     Einar Pheasant, MD

## 2021-09-15 LAB — HEPATIC FUNCTION PANEL
ALT: 16 U/L (ref 0–35)
AST: 19 U/L (ref 0–37)
Albumin: 4.3 g/dL (ref 3.5–5.2)
Alkaline Phosphatase: 64 U/L (ref 39–117)
Bilirubin, Direct: 0.1 mg/dL (ref 0.0–0.3)
Total Bilirubin: 0.7 mg/dL (ref 0.2–1.2)
Total Protein: 6.6 g/dL (ref 6.0–8.3)

## 2021-09-15 LAB — TSH: TSH: 1.09 u[IU]/mL (ref 0.35–5.50)

## 2021-09-15 LAB — LIPID PANEL
Cholesterol: 214 mg/dL — ABNORMAL HIGH (ref 0–200)
HDL: 75 mg/dL (ref >39.00)
LDL Cholesterol: 114 mg/dL — ABNORMAL HIGH (ref 0–99)
NonHDL: 139.26
Total CHOL/HDL Ratio: 3
Triglycerides: 124 mg/dL (ref 0.0–149.0)
VLDL: 24.8 mg/dL (ref 0.0–40.0)

## 2021-09-15 LAB — BASIC METABOLIC PANEL
BUN: 28 mg/dL — ABNORMAL HIGH (ref 6–23)
CO2: 30 mEq/L (ref 19–32)
Calcium: 9.7 mg/dL (ref 8.4–10.5)
Chloride: 100 mEq/L (ref 96–112)
Creatinine, Ser: 1.15 mg/dL (ref 0.40–1.20)
GFR: 46.54 mL/min — ABNORMAL LOW (ref >60.00)
Glucose, Bld: 86 mg/dL (ref 70–99)
Potassium: 4.2 mEq/L (ref 3.5–5.1)
Sodium: 140 mEq/L (ref 135–145)

## 2021-09-15 LAB — HEMOGLOBIN A1C: Hgb A1c MFr Bld: 5.5 % (ref 4.6–6.5)

## 2021-09-16 ENCOUNTER — Other Ambulatory Visit: Payer: Self-pay

## 2021-09-16 ENCOUNTER — Encounter: Payer: Self-pay | Admitting: Internal Medicine

## 2021-09-16 MED ORDER — ROSUVASTATIN CALCIUM 10 MG PO TABS: 10.0000 mg | ORAL_TABLET | Freq: Every day | ORAL | 3 refills | Status: DC

## 2021-09-16 NOTE — Assessment & Plan Note (Signed)
On synthroid.  Follow tsh.   

## 2021-09-16 NOTE — Assessment & Plan Note (Signed)
Found on EGD.  PPI bid.  Upper symptoms appear to be controlled. Arrange f/u with GI.  On eliquis now for recent PE and afib.

## 2021-09-16 NOTE — Assessment & Plan Note (Signed)
Doing well.  Continue zoloft.

## 2021-09-16 NOTE — Assessment & Plan Note (Addendum)
On crestor.  Low cholesterol diet and exercise.  Follow lipid panel and liver function tests.   

## 2021-09-16 NOTE — Assessment & Plan Note (Signed)
Low carb diet and exercise.  Follow met b and a1c.   Lab Results  Component Value Date   HGBA1C 5.5 09/14/2021

## 2021-09-16 NOTE — Assessment & Plan Note (Signed)
Found to have large hiatal hernia and ulcer with no active bleeding.  Saw GI.  Recommended PPI bid.  Discussed with GI.  Had recommended surgery evaluation. Pt request f/u with GI to discuss.

## 2021-09-16 NOTE — Assessment & Plan Note (Signed)
Currently on cardizem and lopressor.  Torsemide. Blood pressure controlled.  Check metabolic panel.

## 2021-09-16 NOTE — Assessment & Plan Note (Signed)
On eliquis.  Continues on diltiazem and metoprolol.   

## 2021-09-16 NOTE — Assessment & Plan Note (Signed)
Previous admission for GI bleed.  No further bleeding.  EGD as outlined.  On eliquis - afib.  Request f/u with GI as outlined.

## 2021-09-16 NOTE — Assessment & Plan Note (Signed)
Continue prilosec.  No upper symptoms.   

## 2021-09-16 NOTE — Assessment & Plan Note (Signed)
Moderate to severe OSA.  Seeing pulmonary.  Recommended CPAP (autoset 5-15).   

## 2021-09-22 ENCOUNTER — Telehealth: Payer: Self-pay

## 2021-09-22 NOTE — Telephone Encounter (Signed)
TCM referral faxed for Eliquis Assistance

## 2021-09-23 ENCOUNTER — Encounter: Payer: Self-pay | Admitting: Ophthalmology

## 2021-09-23 NOTE — Patient Outreach (Signed)
[facility]) Care Management  09/23/2021  SHIFA BRISBON 1945/05/02 [id number]   Received faxed referral from PCP office for medication assistance. Sent request to [place] for follow up.   [place] Management Assistant [phone]

## 2021-09-25 NOTE — Progress Notes (Signed)
I addended my note.  See if this is correct and let me know if I need to do anything more.

## 2021-09-29 NOTE — Discharge Instructions (Signed)

## 2021-10-01 DIAGNOSIS — Z85828 Personal history of other malignant neoplasm of skin: Secondary | ICD-10-CM | POA: Diagnosis not present

## 2021-10-01 DIAGNOSIS — L821 Other seborrheic keratosis: Secondary | ICD-10-CM | POA: Diagnosis not present

## 2021-10-01 DIAGNOSIS — Z872 Personal history of diseases of the skin and subcutaneous tissue: Secondary | ICD-10-CM | POA: Diagnosis not present

## 2021-10-01 DIAGNOSIS — D2272 Melanocytic nevi of left lower limb, including hip: Secondary | ICD-10-CM | POA: Diagnosis not present

## 2021-10-01 DIAGNOSIS — L570 Actinic keratosis: Secondary | ICD-10-CM | POA: Diagnosis not present

## 2021-10-01 DIAGNOSIS — D2271 Melanocytic nevi of right lower limb, including hip: Secondary | ICD-10-CM | POA: Diagnosis not present

## 2021-10-02 ENCOUNTER — Telehealth: Payer: Self-pay | Admitting: Internal Medicine

## 2021-10-02 DIAGNOSIS — K449 Diaphragmatic hernia without obstruction or gangrene: Secondary | ICD-10-CM

## 2021-10-02 NOTE — Telephone Encounter (Signed)
Please call and notify Amanda Browning that I did contact Dr Marius Ditch and she did recommend a referral to surgery regarding her large hiatal hernia, etc.  She recommended a referral to [facility] (in [place]).  Please confirm ok with referral and please confirm if she has a preference of which surgeon she prefers to see.  (Can refer to [facility] or [facility]).  Just let me know.

## 2021-10-02 NOTE — Telephone Encounter (Signed)
Opened in error

## 2021-10-02 NOTE — Telephone Encounter (Signed)
-----   Message from Lin Landsman, MD sent at 09/29/2021  5:04 PM EDT ----- Regarding: RE: follow up? I do not have a preferred surgeon, usually they triage the patient to esophageal surgery team  RV ----- Message ----- From: Einar Pheasant, MD Sent: 09/29/2021   4:58 PM EDT To: Lin Landsman, MD Subject: RE: follow up?                                 Do you want me to refer her?  If so, do you have a preferred surgeon?  Thanks    [name] ----- Message ----- From: Lin Landsman, MD Sent: 09/29/2021   4:42 PM EDT To: Einar Pheasant, MD Subject: RE: follow up?                                 Yes, she will need referral to [facility] surgery for hernia repair  Thanks RV ----- Message ----- From: Einar Pheasant, MD Sent: 09/29/2021   4:05 PM EDT To: Lin Landsman, MD Subject: follow up?                                     You saw [name].  EGD - Large hiatal hernia with multiple Cameron ulcers. Biopsied. - LA Grade D reflux esophagitis with no bleeding. States was mentioned about referral to surgery - regarding large hernia.  She is agreeable to see a surgeon if this is what you recommend.  Let me know if I need to call for f/u with you or if there is something I need to do.  Thank you for your help and for seeing her.    Amanda Browning

## 2021-10-04 ENCOUNTER — Other Ambulatory Visit: Payer: Self-pay

## 2021-10-04 ENCOUNTER — Ambulatory Visit: Payer: Medicare HMO | Admitting: Anesthesiology

## 2021-10-04 ENCOUNTER — Encounter
Admission: RE | Disposition: A | Discharge status: Home / Self Care | Payer: Self-pay | Source: Ambulatory Visit | Attending: Ophthalmology

## 2021-10-04 ENCOUNTER — Ambulatory Visit
Admission: RE | Admit: 2021-10-04 | Discharge: 2021-10-04 | Disposition: A | Discharge status: Home / Self Care | Payer: Medicare HMO | Source: Ambulatory Visit | Attending: Ophthalmology | Admitting: Ophthalmology

## 2021-10-04 DIAGNOSIS — I509 Heart failure, unspecified: Secondary | ICD-10-CM | POA: Insufficient documentation

## 2021-10-04 DIAGNOSIS — Z6837 Body mass index (BMI) 37.0-37.9, adult: Secondary | ICD-10-CM | POA: Insufficient documentation

## 2021-10-04 DIAGNOSIS — K219 Gastro-esophageal reflux disease without esophagitis: Secondary | ICD-10-CM | POA: Insufficient documentation

## 2021-10-04 DIAGNOSIS — I110 Hypertensive heart disease with heart failure: Secondary | ICD-10-CM | POA: Insufficient documentation

## 2021-10-04 DIAGNOSIS — E039 Hypothyroidism, unspecified: Secondary | ICD-10-CM | POA: Diagnosis not present

## 2021-10-04 DIAGNOSIS — D649 Anemia, unspecified: Secondary | ICD-10-CM | POA: Diagnosis not present

## 2021-10-04 DIAGNOSIS — K449 Diaphragmatic hernia without obstruction or gangrene: Secondary | ICD-10-CM | POA: Insufficient documentation

## 2021-10-04 DIAGNOSIS — H2511 Age-related nuclear cataract, right eye: Secondary | ICD-10-CM | POA: Insufficient documentation

## 2021-10-04 DIAGNOSIS — Z7901 Long term (current) use of anticoagulants: Secondary | ICD-10-CM | POA: Insufficient documentation

## 2021-10-04 DIAGNOSIS — Z8711 Personal history of peptic ulcer disease: Secondary | ICD-10-CM | POA: Diagnosis not present

## 2021-10-04 DIAGNOSIS — E669 Obesity, unspecified: Secondary | ICD-10-CM | POA: Insufficient documentation

## 2021-10-04 DIAGNOSIS — I4891 Unspecified atrial fibrillation: Secondary | ICD-10-CM | POA: Insufficient documentation

## 2021-10-04 DIAGNOSIS — D759 Disease of blood and blood-forming organs, unspecified: Secondary | ICD-10-CM | POA: Insufficient documentation

## 2021-10-04 DIAGNOSIS — H25811 Combined forms of age-related cataract, right eye: Secondary | ICD-10-CM | POA: Diagnosis not present

## 2021-10-04 DIAGNOSIS — G4730 Sleep apnea, unspecified: Secondary | ICD-10-CM | POA: Insufficient documentation

## 2021-10-04 DIAGNOSIS — R059 Cough, unspecified: Secondary | ICD-10-CM

## 2021-10-04 HISTORY — PX: CATARACT EXTRACTION W/PHACO: SHX586

## 2021-10-04 SURGERY — PHACOEMULSIFICATION, CATARACT, WITH IOL INSERTION
Anesthesia: Monitor Anesthesia Care | Site: Eye | Laterality: Right

## 2021-10-04 MED ORDER — SIGHTPATH DOSE#1 BSS IO SOLN
INTRAOCULAR | Status: DC | PRN
  Administered 2021-10-04: 109 mL via OPHTHALMIC

## 2021-10-04 MED ORDER — FENTANYL CITRATE (PF) 100 MCG/2ML IJ SOLN
INTRAMUSCULAR | Status: DC | PRN
Start: 2021-10-04 — End: 2021-10-04
  Administered 2021-10-04: 50 ug via INTRAVENOUS

## 2021-10-04 MED ORDER — MIDAZOLAM HCL 2 MG/2ML IJ SOLN
INTRAMUSCULAR | Status: DC | PRN
  Administered 2021-10-04: 1 mg via INTRAVENOUS

## 2021-10-04 MED ORDER — SIGHTPATH DOSE#1 BSS IO SOLN
INTRAOCULAR | Status: DC | PRN
  Administered 2021-10-04: 15 mL

## 2021-10-04 MED ORDER — ARMC OPHTHALMIC DILATING DROPS
1.0000 "application " | OPHTHALMIC | Status: DC | PRN
  Administered 2021-10-04 (×3): 1 via OPHTHALMIC

## 2021-10-04 MED ORDER — SIGHTPATH DOSE#1 SODIUM HYALURONATE 10 MG/ML IO SOLUTION
PREFILLED_SYRINGE | INTRAOCULAR | Status: DC | PRN
  Administered 2021-10-04: 0.85 mL via INTRAOCULAR

## 2021-10-04 MED ORDER — ONDANSETRON HCL 4 MG/2ML IJ SOLN: 4.0000 mg | Freq: Once | INTRAMUSCULAR | Status: DC | PRN

## 2021-10-04 MED ORDER — SIGHTPATH DOSE#1 SODIUM HYALURONATE 23 MG/ML IO SOLUTION
PREFILLED_SYRINGE | INTRAOCULAR | Status: DC | PRN
  Administered 2021-10-04: 0.6 mL via INTRAOCULAR

## 2021-10-04 MED ORDER — ACETAMINOPHEN 325 MG PO TABS: 650.0000 mg | ORAL_TABLET | ORAL | Status: DC | PRN

## 2021-10-04 MED ORDER — ACETAMINOPHEN 160 MG/5ML PO SOLN: 325.0000 mg | ORAL | Status: DC | PRN

## 2021-10-04 MED ORDER — TETRACAINE HCL 0.5 % OP SOLN
1.0000 [drp] | OPHTHALMIC | Status: DC | PRN
  Administered 2021-10-04 (×3): 1 [drp] via OPHTHALMIC

## 2021-10-04 MED ORDER — MOXIFLOXACIN HCL 0.5 % OP SOLN
OPHTHALMIC | Status: DC | PRN
  Administered 2021-10-04: 0.2 mL via OPHTHALMIC

## 2021-10-04 MED ORDER — LACTATED RINGERS IV SOLN: INTRAVENOUS | Status: DC

## 2021-10-04 MED ORDER — LIDOCAINE HCL (PF) 2 % IJ SOLN
INTRAOCULAR | Status: DC | PRN
  Administered 2021-10-04: 1 mL via INTRAOCULAR

## 2021-10-04 SURGICAL SUPPLY — 14 items
CATARACT SUITE SIGHTPATH (MISCELLANEOUS) ×2 IMPLANT
DISSECTOR HYDRO NUCLEUS 50X22 (MISCELLANEOUS) ×2 IMPLANT
FEE CATARACT SUITE SIGHTPATH (MISCELLANEOUS) ×1 IMPLANT
GLOVE SURG GAMMEX PI TX LF 7.5 (GLOVE) ×2 IMPLANT
GLOVE SURG SYN 8.5  E (GLOVE) ×2
GLOVE SURG SYN 8.5 E (GLOVE) ×1 IMPLANT
GLOVE SURG SYN 8.5 PF PI (GLOVE) ×1 IMPLANT
LENS IOL TECNIS EYHANCE 21.0 (Intraocular Lens) ×1 IMPLANT
NDL FILTER BLUNT 18X1 1/2 (NEEDLE) ×1 IMPLANT
NEEDLE FILTER BLUNT 18X 1/2SAF (NEEDLE) ×1
NEEDLE FILTER BLUNT 18X1 1/2 (NEEDLE) ×1 IMPLANT
SYR 3ML LL SCALE MARK (SYRINGE) ×2 IMPLANT
SYR 5ML LL (SYRINGE) ×2 IMPLANT
WATER STERILE IRR 250ML POUR (IV SOLUTION) ×2 IMPLANT

## 2021-10-04 NOTE — Anesthesia Preprocedure Evaluation (Signed)
Anesthesia Evaluation  Patient identified by MRN, date of birth, ID band Patient awake    Reviewed: Allergy & Precautions, NPO status , Patient's Chart, lab work & pertinent test results, reviewed documented beta blocker date and time   History of Anesthesia Complications (+) PONV and history of anesthetic complications  Airway Mallampati: III  TM Distance: <3 FB Neck ROM: Limited    Dental   Pulmonary sleep apnea ,    breath sounds clear to auscultation       Cardiovascular hypertension, (-) angina+CHF  (-) DOE + dysrhythmias (on Eliquis) Atrial Fibrillation  Rhythm:Regular Rate:Normal   HLD  Echo 03/15/21:  EF 55-60% Mild LVH Moderately elevated pulmonary artery systolic pressure Moderate dilation of LA and RA Mild MR, TR   Neuro/Psych  Headaches,    GI/Hepatic hiatal hernia, PUD, GERD  ,  Endo/Other  Hypothyroidism   Renal/GU      Musculoskeletal  (+) Arthritis , Osteoarthritis,    Abdominal (+) + obese (BMI 37),   Peds  Hematology  (+) Blood dyscrasia, anemia ,  H/o PE   Anesthesia Other Findings Skin cancer  Reproductive/Obstetrics                             Anesthesia Physical Anesthesia Plan  ASA: 3  Anesthesia Plan: MAC   Post-op Pain Management:    Induction: Intravenous  PONV Risk Score and Plan: 3 and TIVA, Midazolam and Treatment may vary due to age or medical condition  Airway Management Planned: Nasal Cannula  Additional Equipment:   Intra-op Plan:   Post-operative Plan:   Informed Consent: I have reviewed the patients History and Physical, chart, labs and discussed the procedure including the risks, benefits and alternatives for the proposed anesthesia with the patient or authorized representative who has indicated his/her understanding and acceptance.       Plan Discussed with: CRNA and Anesthesiologist  Anesthesia Plan Comments:          Anesthesia Quick Evaluation

## 2021-10-04 NOTE — Op Note (Signed)
OPERATIVE NOTE  Amanda Browning [id number] 10/04/2021   PREOPERATIVE DIAGNOSIS:  Nuclear sclerotic cataract right eye.  H25.11   POSTOPERATIVE DIAGNOSIS:    Nuclear sclerotic cataract right eye.     PROCEDURE:  Phacoemusification with posterior chamber intraocular lens placement of the right eye   LENS:   Implant Name Type Inv. Item Serial No. Manufacturer Lot No. LRB No. Used Action  LENS IOL TECNIS EYHANCE 21.0 - [id number] Intraocular Lens LENS IOL TECNIS EYHANCE 21.0 [id number] [employer]  Right 1 Implanted       Procedure(s): CATARACT EXTRACTION PHACO AND INTRAOCULAR LENS PLACEMENT (IOC) RIGHT 6.08 00:45.0 (Right)  [id number] +21.0   ULTRASOUND TIME: 0 minutes 45 seconds.  CDE 6.08   SURGEON:  Benay Pillow, MD, MPH  ANESTHESIOLOGIST: Anesthesiologist: Heniser, Fredric Dine, MD CRNA: Dionne Bucy, CRNA   ANESTHESIA:  Topical with tetracaine drops augmented with 1% preservative-free intracameral lidocaine.  ESTIMATED BLOOD LOSS: less than 1 mL.   COMPLICATIONS:  None.   DESCRIPTION OF PROCEDURE:  The patient was identified in the holding room and transported to the operating room and placed in the supine position under the operating microscope.  The right eye was identified as the operative eye and it was prepped and draped in the usual sterile ophthalmic fashion.   A 1.0 millimeter clear-corneal paracentesis was made at the 10:30 position. 0.5 ml of preservative-free 1% lidocaine with epinephrine was injected into the anterior chamber.  The anterior chamber was filled with Healon 5 viscoelastic.  A 2.4 millimeter keratome was used to make a near-clear corneal incision at the 8:00 position.  A curvilinear capsulorrhexis was made with a cystotome and capsulorrhexis forceps.  Balanced salt solution was used to hydrodissect and hydrodelineate the nucleus.   Phacoemulsification was then used in stop and chop fashion to remove the lens nucleus and epinucleus.  The remaining cortex was  then removed using the irrigation and aspiration handpiece. Healon was then placed into the capsular bag to distend it for lens placement.  A lens was then injected into the capsular bag.  The remaining viscoelastic was aspirated.   Wounds were hydrated with balanced salt solution.  The anterior chamber was inflated to a physiologic pressure with balanced salt solution.   Intracameral vigamox 0.1 mL undiluted was injected into the eye and a drop placed onto the ocular surface.  No wound leaks were noted.  The patient was taken to the recovery room in stable condition without complications of anesthesia or surgery  Benay Pillow 10/04/2021, 11:09 AM

## 2021-10-04 NOTE — H&P (Signed)
[facility]   Primary Care Physician:  Einar Pheasant, MD Ophthalmologist: Dr. Benay Pillow  Pre-Procedure History & Physical: HPI:  Amanda Browning is a 76 y.o. female here for cataract surgery.   Past Medical History:  Diagnosis Date   Anemia    Chicken pox    Colon polyps    Complication of anesthesia    doesn't take much anesthesia to put pt to sleep, slow to wake up   DDD (degenerative disc disease), cervical 02/05/2013   C5-C6 and C6-C7   GERD (gastroesophageal reflux disease)    Heart murmur    History of atrial fibrillation    History of chicken pox    Hypercholesterolemia    Hyperlipidemia    Hypertension    Hypothyroidism    Migraines    PONV (postoperative nausea and vomiting)    Sleep apnea    Squamous cell skin cancer     Past Surgical History:  Procedure Laterality Date   BREAST BIOPSY Right 2012   stereotactic   CARDIOVERSION N/A 04/29/2021   Procedure: CARDIOVERSION;  Surgeon: [name], MD;  Location: [facility] ORS;  Service: Cardiovascular;  Laterality: N/A;   CATARACT EXTRACTION W/PHACO Left 09/06/2021   Procedure: CATARACT EXTRACTION PHACO AND INTRAOCULAR LENS PLACEMENT (IOC) LEFT 6.60 00:43.8;  Surgeon: Eulogio Bear, MD;  Location: [place];  Service: Ophthalmology;  Laterality: Left;   COLONOSCOPY WITH PROPOFOL N/A 02/23/2018   Procedure: COLONOSCOPY WITH PROPOFOL;  Surgeon: Manya Silvas, MD;  Location: [facility];  Service: Endoscopy;  Laterality: N/A;   ESOPHAGOGASTRODUODENOSCOPY N/A 03/15/2021   Procedure: ESOPHAGOGASTRODUODENOSCOPY (EGD);  Surgeon: Lin Landsman, MD;  Location: [facility] ENDOSCOPY;  Service: Gastroenterology;  Laterality: N/A;   KNEE ARTHROPLASTY Right 04/17/2017   Procedure: COMPUTER ASSISTED TOTAL KNEE ARTHROPLASTY;  Surgeon: Dereck Leep, MD;  Location: [facility] ORS;  Service: Orthopedics;  Laterality: Right;   SKIN BIOPSY     VAGINAL HYSTERECTOMY  1994   ovaries not removed   WISDOM  TOOTH EXTRACTION      Prior to Admission medications   Medication Sig Start Date End Date Taking? Authorizing Provider  acetaminophen (TYLENOL) 325 MG tablet Take 650 mg by mouth 4 (four) times daily as needed.   Yes [provider]  apixaban (ELIQUIS) 5 MG TABS tablet Take 1 tablet (5 mg total) by mouth 2 (two) times daily. 07/14/21  Yes [name], MD  cetirizine (ZYRTEC) 10 MG tablet Take 10 mg by mouth every evening.   Yes [provider]  diltiazem (CARDIZEM CD) 240 MG 24 hr capsule Take 1 capsule (240 mg total) by mouth daily. 05/04/21  Yes Einar Pheasant, MD  ferrous sulfate 325 (65 FE) MG tablet Take 325 mg by mouth in the morning.   Yes [provider]  levothyroxine (SYNTHROID) 75 MCG tablet Take 1 tablet (75 mcg total) by mouth daily at 6 (six) AM. 07/26/21  Yes Einar Pheasant, MD  losartan (COZAAR) 25 MG tablet Take 0.5 tablets (12.5 mg total) by mouth in the morning. 05/13/21  Yes Einar Pheasant, MD  magnesium oxide (MAG-OX) 400 MG tablet Take 1 tablet (400 mg total) by mouth 2 (two) times daily. 01/24/17  Yes Einar Pheasant, MD  metoprolol tartrate (LOPRESSOR) 100 MG tablet TAKE 1 TABLET TWICE DAILY 07/19/21  Yes Einar Pheasant, MD  Multiple Vitamin (MULTIVITAMIN WITH MINERALS) TABS tablet Take 1 tablet by mouth in the morning.   Yes [provider]  omeprazole (PRILOSEC) 20 MG capsule  TAKE 1 CAPSULE TWICE DAILY BEFORE MEALS 05/12/21  Yes Einar Pheasant, MD  potassium chloride (MICRO-K) 10 MEQ CR capsule Take 10 mEq with torsemide 20 mg, take 20 Meq with torsemide 40 mg 05/31/21  Yes Gollan, Kathlene November, MD  rosuvastatin (CRESTOR) 10 MG tablet Take 1 tablet (10 mg total) by mouth daily. 09/16/21  Yes Einar Pheasant, MD  sertraline (ZOLOFT) 50 MG tablet TAKE 1 TABLET DAILY 05/12/21  Yes Einar Pheasant, MD  spironolactone (ALDACTONE) 25 MG tablet Take 1 tablet (25 mg total) by mouth daily. 05/13/21  Yes Einar Pheasant, MD  torsemide (DEMADEX) 20 MG  tablet Weight <205 no torsemide Weight 205-211 take torsemide 20 mg daily and potassium 10 meq Weight >212 take torsemide 40 mg daily with potassium 20 mEq 05/31/21  Yes Gollan, Kathlene November, MD    Allergies as of 09/07/2021 - Review Complete 09/06/2021  Allergen Reaction Noted   Codeine Other (See Comments) 03/19/2012    Family History  Problem Relation Age of Onset   Heart disease Mother    Stroke Mother    Hypertension Mother    Heart attack Mother    Heart disease Father        enlarged heart   Stroke Father    Hypertension Father    Hyperlipidemia Father    Heart attack Father    Ovarian cancer Paternal Aunt    Breast cancer Sister 51   Cancer Sister        Breast Cancer   Diabetes type II Brother    Hypertension Brother    Colon cancer Neg Hx    Basal cell carcinoma Neg Hx    Melanoma Neg Hx    Squamous cell carcinoma Neg Hx     Social History   Socioeconomic History   Marital status: Divorced    Spouse name: Not on file   Number of children: 2   Years of education: 12   Highest education level: High school graduate  Occupational History   Not on file  Tobacco Use   Smoking status: Never   Smokeless tobacco: Never  Vaping Use   Vaping Use: Never used  Substance and Sexual Activity   Alcohol use: No    Alcohol/week: 0.0 standard drinks of alcohol   Drug use: No   Sexual activity: Never  Other Topics Concern   Not on file  Social History Narrative   Full Code   Alcohol - none   Never smoker and no smokeless tobacco   2 daughters   Social Determinants of Health   Financial Resource Strain: Low Risk  (07/28/2017)   Overall Financial Resource Strain (CARDIA)    Difficulty of Paying Living Expenses: Not hard at all  Food Insecurity: No Food Insecurity (07/28/2017)   Hunger Vital Sign    Worried About Running Out of Food in the Last Year: Never true    Ran Out of Food in the Last Year: Never true  Transportation Needs: No Transportation Needs (07/28/2017)    PRAPARE - Hydrologist (Medical): No    Lack of Transportation (Non-Medical): No  Physical Activity: Insufficiently Active (07/28/2017)   Exercise Vital Sign    Days of Exercise per Week: 4 days    Minutes of Exercise per Session: 30 min  Stress: No Stress Concern Present (07/28/2017)   [place]    Feeling of Stress : Not at all  Social Connections: Not on file  Intimate Partner Violence: Not on file    Review of Systems: See HPI, otherwise negative ROS  Physical Exam: BP (!) 142/83   Pulse (!) 49   Temp (!) 97 F (36.1 C) (Temporal)   Ht '5\' 3"'$  (1.6 m)   Wt 95 kg   SpO2 97%   BMI 37.10 kg/m  General:   Alert, cooperative in NAD Head:  Normocephalic and atraumatic. Respiratory:  Normal work of breathing. Cardiovascular:  RRR  Impression/Plan: Amanda Browning is here for cataract surgery.  Risks, benefits, limitations, and alternatives regarding cataract surgery have been reviewed with the patient.  Questions have been answered.  All parties agreeable.   Benay Pillow, MD  10/04/2021, 10:40 AM

## 2021-10-04 NOTE — Telephone Encounter (Signed)
Per Dr Marius Ditch - referral to surgery as outlined.  Choices as outlined.  See note.

## 2021-10-04 NOTE — Anesthesia Procedure Notes (Signed)
Procedure Name: MAC Date/Time: 10/04/2021 10:44 AM  Performed by: Dionne Bucy, CRNAPre-anesthesia Checklist: Patient identified, Emergency Drugs available, Suction available, Patient being monitored and Timeout performed Patient Re-evaluated:Patient Re-evaluated prior to induction Oxygen Delivery Method: Nasal cannula Placement Confirmation: positive ETCO2

## 2021-10-04 NOTE — Anesthesia Postprocedure Evaluation (Signed)
Anesthesia Post Note  Patient: Amanda Browning  Procedure(s) Performed: CATARACT EXTRACTION PHACO AND INTRAOCULAR LENS PLACEMENT (IOC) RIGHT 6.08 00:45.0 (Right: Eye)     Patient location during evaluation: PACU Anesthesia Type: MAC Level of consciousness: awake and alert Pain management: pain level controlled Vital Signs Assessment: post-procedure vital signs reviewed and stable Respiratory status: spontaneous breathing, nonlabored ventilation, respiratory function stable and patient connected to nasal cannula oxygen Cardiovascular status: stable and blood pressure returned to baseline Postop Assessment: no apparent nausea or vomiting Anesthetic complications: no   No notable events documented.  Shawnta Schlegel A  [place]

## 2021-10-04 NOTE — Telephone Encounter (Signed)
S/w pt daughter - advised spoke to Dr Marius Ditch and surgeon referral was recommended. [place], [place] or [place]. Pt daughter stated will discuss with pt and call us back.

## 2021-10-04 NOTE — Telephone Encounter (Signed)
Patient called office back. She states she prefers to stay local and not go to [place].

## 2021-10-04 NOTE — Transfer of Care (Signed)
Immediate Anesthesia Transfer of Care Note  Patient: Amanda Browning  Procedure(s) Performed: CATARACT EXTRACTION PHACO AND INTRAOCULAR LENS PLACEMENT (IOC) RIGHT 6.08 00:45.0 (Right: Eye)  Patient Location: PACU  Anesthesia Type: MAC  Level of Consciousness: awake, alert  and patient cooperative  Airway and Oxygen Therapy: Patient Spontanous Breathing and Patient connected to supplemental oxygen  Post-op Assessment: Post-op Vital signs reviewed, Patient's Cardiovascular Status Stable, Respiratory Function Stable, Patent Airway and No signs of Nausea or vomiting  Post-op Vital Signs: Reviewed and stable  Complications: No notable events documented.

## 2021-10-05 ENCOUNTER — Encounter: Payer: Self-pay | Admitting: Ophthalmology

## 2021-10-05 ENCOUNTER — Telehealth: Payer: Self-pay | Admitting: Pharmacy Technician

## 2021-10-05 DIAGNOSIS — Z596 Low income: Secondary | ICD-10-CM

## 2021-10-05 NOTE — Telephone Encounter (Signed)
Order placed for GI referral to Dr Alice Reichert.  Someone should be contacting her with an appt date and time.

## 2021-10-05 NOTE — Telephone Encounter (Signed)
[name] called , [phone], She wanted to speak to [facility]. She states that patient will go to [place], but she rather see  Dr Alice Reichert , Copywriter, advertising.

## 2021-10-05 NOTE — Progress Notes (Signed)
[place] [facility])                                            [place] Team    10/05/2021  CORRINE TILLIS 05-20-1945 [id number]                                      Medication Assistance Referral  Referral From: [place]  Medication/Company: Eliquis / [employer] Patient application portion:  Mailed Provider application portion: Faxed  to Dr. Einar Pheasant Provider address/fax verified via: Office website   Akito Boomhower P. Kolette Vey, [place]  [phone]

## 2021-10-05 NOTE — Telephone Encounter (Signed)
S/w [name]  - stated pt now wants 2nd opinion with Dr. Alice Reichert. Stated that this was previously discussed , but had never heard anything back about referral for 2nd opinion. [name] stated pt "does not care" for Dr. Marius Ditch and Dr. Vicente Males .Marland Kitchen  Stated she could not understand what they were saying and does not understand why she has to travel in order to get this surgery.  [name] stated pt will go to [facility] if she absolutely must, but would rather see Dr Alice Reichert and see what he has to say.Marland KitchenMarland Kitchen

## 2021-10-05 NOTE — Telephone Encounter (Signed)
S/w pt daughter -  Again advised her that no one local does the surgery required. Choices are [place], [place], or [place]. Pt daughter stated she doesn't understand how no one local does hernia repair.Durward Fortes pt daughter that per Dr Nicki Reaper and Dr Marius Ditch, the choices provided are the best choices for the pt. Pt daughter stated she is not sure that pt will comply if has to travel. Stated will speak with pt, and call us back.

## 2021-10-05 NOTE — Addendum Note (Signed)
Addended by: Alisa Graff on: 10/05/2021 07:10 PM   Modules accepted: Orders

## 2021-10-06 NOTE — Telephone Encounter (Signed)
[name] advised 

## 2021-11-03 ENCOUNTER — Other Ambulatory Visit: Payer: Self-pay | Admitting: Internal Medicine

## 2021-11-09 ENCOUNTER — Telehealth: Payer: Self-pay | Admitting: Pharmacy Technician

## 2021-11-09 DIAGNOSIS — Z596 Low income: Secondary | ICD-10-CM

## 2021-11-09 NOTE — Progress Notes (Signed)
[place] [facility])                                            [place] Team    11/09/2021  Amanda Browning 05-28-1945 [id number]  Received both patient and provider portion(s) of patient assistance application(s) for Eliquis. Faxed completed application and required documents into [employer].    [name] P. Tyliah Schlereth, [place]  [phone]

## 2021-11-12 ENCOUNTER — Telehealth: Payer: Self-pay | Admitting: Pharmacy Technician

## 2021-11-12 DIAGNOSIS — Z596 Low income: Secondary | ICD-10-CM

## 2021-11-12 NOTE — Progress Notes (Signed)
[place] [facility])                                            [place] Team    11/12/2021  Amanda Browning November 30, 1945 [id number]  Care coordination call placed to [employer] in regard to Eliquis application.  Spoke to [employer] who informs patient is APPROVED 11/11/21-04/17/22. She informs medication will be delivered to the patient's home address.  [name] P. [name], [place]  [phone]

## 2021-11-24 ENCOUNTER — Other Ambulatory Visit: Payer: Self-pay | Admitting: Internal Medicine

## 2021-11-24 DIAGNOSIS — Z8719 Personal history of other diseases of the digestive system: Secondary | ICD-10-CM

## 2021-11-24 DIAGNOSIS — K449 Diaphragmatic hernia without obstruction or gangrene: Secondary | ICD-10-CM

## 2021-11-24 DIAGNOSIS — D649 Anemia, unspecified: Secondary | ICD-10-CM

## 2021-11-24 DIAGNOSIS — K259 Gastric ulcer, unspecified as acute or chronic, without hemorrhage or perforation: Secondary | ICD-10-CM

## 2021-11-24 NOTE — Telephone Encounter (Signed)
Message sent to rasheedah to look into referral

## 2021-11-24 NOTE — Telephone Encounter (Signed)
Pt daughter called stating pt has not heard back from [place] regarding an appointment

## 2021-11-24 NOTE — Progress Notes (Signed)
Order placed for referral to GI and [place] surgery

## 2021-11-28 NOTE — Progress Notes (Signed)
Cardiology Office Note  Date:  11/29/2021   ID:  Amanda, Browning 09/10/1945, MRN [id number]  PCP:  Einar Pheasant, MD   Chief Complaint  Patient presents with   6 month follow up     Patient c/o some mild LE edema and shortness of breath with over exertion. Medications reviewed by the patient verblaly.     HPI:  Amanda Browning is a 76 y.o. female with a hx of  hypertension,  hyperlipidemia  Admission to [place] 11/22 for GI bleed,  A. fib RVR , acute diastolic CHF Started on Eliquis March 26, 2021 Torsemide dose decreased for renal dysfunction Admission to [facility] diastolic CHF requiring IV Lasix, discharged on torsemide 40 daily, BNP 411, had 11 L diuresis 15 pounds down Baseline weight 205 pounds Echocardiogram on 04/21/21 showed new RV dilation and systolic dysfunction. CTA showed acute right lower lobe pulmonary embolism. Large hiatal hernia Who presents for routine follow-up of her diastolic CHF, afib  LOV 4//43 Cardioversion 04/29/21  In follow-up today in general reports she is doing well Some days with shortness of breath Denies significant leg swelling Taking torsemide on average 3x a week , PRN Took one today Feels her weight is stable  Some gerd sx Large hiatal hernia on CT 11/22 Wondering whether to have surgery  Denies tachypalpitations concerning for atrial fibrillation Lab work stable  EKG personally reviewed by myself on todays visit Sinus bradycardia rate 57 bpm no significant ST-T wave changes, PAC  Prior records from November 2022 reviewed GI bleeding 4 days prior to admission symptoms of hypotensive, weakness Hemoglobin 7.2 on arrival, was transfused EGD November 28, ulcers noted, no active bleeding, started on PPI twice daily    PMH:   has a past medical history of Anemia, Chicken pox, Colon polyps, Complication of anesthesia, DDD (degenerative disc disease), cervical (02/05/2013), GERD (gastroesophageal reflux disease), Heart murmur,  History of atrial fibrillation, History of chicken pox, Hypercholesterolemia, Hyperlipidemia, Hypertension, Hypothyroidism, Migraines, PONV (postoperative nausea and vomiting), Sleep apnea, and Squamous cell skin cancer.  PSH:    Past Surgical History:  Procedure Laterality Date   BREAST BIOPSY Right 2012   stereotactic   CARDIOVERSION N/A 04/29/2021   Procedure: CARDIOVERSION;  Surgeon: [name], MD;  Location: [facility];  Service: Cardiovascular;  Laterality: N/A;   CATARACT EXTRACTION W/PHACO Left 09/06/2021   Procedure: CATARACT EXTRACTION PHACO AND INTRAOCULAR LENS PLACEMENT (IOC) LEFT 6.60 00:43.8;  Surgeon: Eulogio Bear, MD;  Location: [place];  Service: Ophthalmology;  Laterality: Left;   CATARACT EXTRACTION W/PHACO Right 10/04/2021   Procedure: CATARACT EXTRACTION PHACO AND INTRAOCULAR LENS PLACEMENT (IOC) RIGHT 6.08 00:45.0;  Surgeon: Eulogio Bear, MD;  Location: [place];  Service: Ophthalmology;  Laterality: Right;   COLONOSCOPY WITH PROPOFOL N/A 02/23/2018   Procedure: COLONOSCOPY WITH PROPOFOL;  Surgeon: Manya Silvas, MD;  Location: [facility] ENDOSCOPY;  Service: Endoscopy;  Laterality: N/A;   ESOPHAGOGASTRODUODENOSCOPY N/A 03/15/2021   Procedure: ESOPHAGOGASTRODUODENOSCOPY (EGD);  Surgeon: Lin Landsman, MD;  Location: [facility] ENDOSCOPY;  Service: Gastroenterology;  Laterality: N/A;   KNEE ARTHROPLASTY Right 04/17/2017   Procedure: COMPUTER ASSISTED TOTAL KNEE ARTHROPLASTY;  Surgeon: Dereck Leep, MD;  Location: [facility] ORS;  Service: Orthopedics;  Laterality: Right;   SKIN BIOPSY     VAGINAL HYSTERECTOMY  1994   ovaries not removed   WISDOM TOOTH EXTRACTION      Current Outpatient Medications  Medication Sig Dispense Refill   acetaminophen (TYLENOL) 325 MG tablet  Take 650 mg by mouth 4 (four) times daily as needed.     apixaban (ELIQUIS) 5 MG TABS tablet Take 1 tablet (5 mg total) by mouth 2 (two) times daily. 60 tablet 6    cetirizine (ZYRTEC) 10 MG tablet Take 10 mg by mouth every evening.     diltiazem (CARDIZEM CD) 240 MG 24 hr capsule TAKE 1 CAPSULE (240 MG TOTAL) BY MOUTH DAILY. 90 capsule 1   ferrous sulfate 325 (65 FE) MG tablet Take 325 mg by mouth in the morning.     levothyroxine (SYNTHROID) 75 MCG tablet TAKE 1 TABLET EVERY DAY AT 6:00 AM 90 tablet 1   losartan (COZAAR) 25 MG tablet Take 0.5 tablets (12.5 mg total) by mouth in the morning. 45 tablet 2   magnesium oxide (MAG-OX) 400 MG tablet Take 1 tablet (400 mg total) by mouth 2 (two) times daily. 60 tablet 2   metoprolol tartrate (LOPRESSOR) 100 MG tablet TAKE 1 TABLET TWICE DAILY 180 tablet 0   Multiple Vitamin (MULTIVITAMIN WITH MINERALS) TABS tablet Take 1 tablet by mouth in the morning.     omeprazole (PRILOSEC) 20 MG capsule TAKE 1 CAPSULE TWICE DAILY BEFORE MEALS 180 capsule 1   potassium chloride (MICRO-K) 10 MEQ CR capsule Take 10 mEq with torsemide 20 mg, take 20 Meq with torsemide 40 mg 90 capsule 3   rosuvastatin (CRESTOR) 10 MG tablet Take 1 tablet (10 mg total) by mouth daily. 90 tablet 3   sertraline (ZOLOFT) 50 MG tablet TAKE 1 TABLET DAILY 90 tablet 1   spironolactone (ALDACTONE) 25 MG tablet Take 1 tablet (25 mg total) by mouth daily. 90 tablet 2   torsemide (DEMADEX) 20 MG tablet Weight <205 no torsemide Weight 205-211 take torsemide 20 mg daily and potassium 10 meq Weight >212 take torsemide 40 mg daily with potassium 20 mEq 60 tablet 1   No current facility-administered medications for this visit.     Allergies:   Codeine   Social History:  The patient  reports that she has never smoked. She has never used smokeless tobacco. She reports that she does not drink alcohol and does not use drugs.   Family History:   family history includes Breast cancer (age of onset: 10) in her sister; Cancer in her sister; Diabetes type II in her brother; Heart attack in her father and mother; Heart disease in her father and mother; Hyperlipidemia  in her father; Hypertension in her brother, father, and mother; Ovarian cancer in her paternal aunt; Stroke in her father and mother.    Review of Systems: Review of Systems  Constitutional: Negative.   HENT: Negative.    Respiratory: Negative.    Cardiovascular: Negative.   Gastrointestinal: Negative.   Musculoskeletal: Negative.   Neurological: Negative.   Psychiatric/Behavioral: Negative.    All other systems reviewed and are negative.    PHYSICAL EXAM: VS:  BP 118/70 (BP Location: Left Arm, Patient Position: Sitting, Cuff Size: Large)   Pulse (!) 57   Ht '5\' 3"'$  (1.6 m)   Wt 211 lb 8 oz (95.9 kg)   SpO2 97%   BMI 37.47 kg/m  , BMI Body mass index is 37.47 kg/m. Constitutional:  oriented to person, place, and time. No distress.  Obese HENT:  Head: Grossly normal Eyes:  no discharge. No scleral icterus.  Neck: No JVD, no carotid bruits  Cardiovascular: Regular rate and rhythm, no murmurs appreciated Pulmonary/Chest: Clear to auscultation bilaterally, no wheezes or rails Abdominal: Soft.  no distension.  no tenderness.  Musculoskeletal: Normal range of motion Neurological:  normal muscle tone. Coordination normal. No atrophy Skin: Skin warm and dry Psychiatric: normal affect, pleasant  Recent Labs: 03/19/2021: Magnesium 2.0 03/30/2021: Platelets 316.0 04/02/2021: Hemoglobin 10.3 09/14/2021: ALT 16; BUN 28; Creatinine, Ser 1.15; Potassium 4.2; Sodium 140; TSH 1.09    Lipid Panel Lab Results  Component Value Date   CHOL 214 (H) 09/14/2021   HDL 75.00 09/14/2021   LDLCALC 114 (H) 09/14/2021   TRIG 124.0 09/14/2021    Wt Readings from Last 3 Encounters:  11/29/21 211 lb 8 oz (95.9 kg)  10/04/21 209 lb 7 oz (95 kg)  09/14/21 209 lb 12.8 oz (95.2 kg)     ASSESSMENT AND PLAN:  Problem List Items Addressed This Visit       Cardiology Problems   Hypertension   Hypercholesterolemia   Atrial fibrillation (HCC) - Primary   Acute on chronic diastolic CHF  (congestive heart failure) ([place])   Pulmonary embolism (HCC)   Other Visit Diagnoses     Dyspnea, unspecified type         Persistent atrial fibrillation Maintaining normal sinus rhythm Strip heart cardioversion continue Eliquis 5 twice daily, Toprol tartrate, diltiazem ER 240 No medication changes made  Chronic diastolic CHF Appears relatively euvolemic Recommend she follow the regimen as below Weight <205   no torsemide Weight 205-211   take torsemide 20 mg daily and potassium 10 meq Weight >212  take torsemide 40 mg daily with potassium 20 mEq  Essential hypertension Blood pressure is well controlled on today's visit. No changes made to the medications.  Hiatal hernia Gerd sx   Total encounter time more than 30 minutes  Greater than 50% was spent in counseling and coordination of care with the patient    Signed, Esmond Plants, M.D., Ph.D. [place], [place]

## 2021-11-29 ENCOUNTER — Other Ambulatory Visit: Payer: Self-pay | Admitting: Internal Medicine

## 2021-11-29 ENCOUNTER — Ambulatory Visit: Payer: Medicare HMO | Admitting: Cardiovascular Disease

## 2021-11-29 ENCOUNTER — Encounter: Payer: Self-pay | Admitting: Cardiovascular Disease

## 2021-11-29 VITALS — BP 118/70 | HR 57 | Ht 63.0 in | Wt 211.5 lb

## 2021-11-29 DIAGNOSIS — E7800 Pure hypercholesterolemia, unspecified: Secondary | ICD-10-CM

## 2021-11-29 DIAGNOSIS — R0600 Dyspnea, unspecified: Secondary | ICD-10-CM

## 2021-11-29 DIAGNOSIS — I2699 Other pulmonary embolism without acute cor pulmonale: Secondary | ICD-10-CM | POA: Diagnosis not present

## 2021-11-29 DIAGNOSIS — I4819 Other persistent atrial fibrillation: Secondary | ICD-10-CM | POA: Diagnosis not present

## 2021-11-29 DIAGNOSIS — I10 Essential (primary) hypertension: Secondary | ICD-10-CM

## 2021-11-29 DIAGNOSIS — I5033 Acute on chronic diastolic (congestive) heart failure: Secondary | ICD-10-CM

## 2021-11-29 NOTE — Patient Instructions (Signed)
Medication Instructions:  Weight <205   no torsemide Weight 205-211   take torsemide 20 mg daily and potassium 10 meq Weight >212  take torsemide 40 mg daily with potassium 20 mEq  If you need a refill on your cardiac medications before your next appointment, please call your pharmacy.   Lab work: No new labs needed  Testing/Procedures: No new testing needed  Follow-Up: At [facility], you and your health needs are our priority.  As part of our continuing mission to provide you with exceptional heart care, we have created designated Provider Care Teams.  These Care Teams include your primary Cardiologist (physician) and Advanced Practice Providers (APPs -  Physician Assistants and Nurse Practitioners) who all work together to provide you with the care you need, when you need it.  You will need a follow up appointment in 12 months  Providers on your designated Care Team:   Murray Hodgkins, NP Christell Faith, PA-C Cadence Kathlen Mody, [place]  COVID-19 Vaccine Information can be found at: [email or web address] For questions related to vaccine distribution or appointments, please email [email or web address] or call [phone].

## 2021-12-27 ENCOUNTER — Other Ambulatory Visit: Payer: Self-pay | Admitting: Internal Medicine

## 2022-01-17 ENCOUNTER — Encounter: Payer: Self-pay | Admitting: Internal Medicine

## 2022-01-17 ENCOUNTER — Ambulatory Visit (INDEPENDENT_AMBULATORY_CARE_PROVIDER_SITE_OTHER): Payer: Medicare HMO | Admitting: Internal Medicine

## 2022-01-17 ENCOUNTER — Ambulatory Visit (INDEPENDENT_AMBULATORY_CARE_PROVIDER_SITE_OTHER): Payer: Medicare HMO

## 2022-01-17 VITALS — BP 130/80 | HR 60 | Temp 98.8°F | Ht 63.0 in | Wt 210.6 lb

## 2022-01-17 DIAGNOSIS — I4819 Other persistent atrial fibrillation: Secondary | ICD-10-CM

## 2022-01-17 DIAGNOSIS — G4733 Obstructive sleep apnea (adult) (pediatric): Secondary | ICD-10-CM

## 2022-01-17 DIAGNOSIS — K449 Diaphragmatic hernia without obstruction or gangrene: Secondary | ICD-10-CM | POA: Diagnosis not present

## 2022-01-17 DIAGNOSIS — M8588 Other specified disorders of bone density and structure, other site: Secondary | ICD-10-CM | POA: Diagnosis not present

## 2022-01-17 DIAGNOSIS — E039 Hypothyroidism, unspecified: Secondary | ICD-10-CM | POA: Diagnosis not present

## 2022-01-17 DIAGNOSIS — E7800 Pure hypercholesterolemia, unspecified: Secondary | ICD-10-CM | POA: Diagnosis not present

## 2022-01-17 DIAGNOSIS — R739 Hyperglycemia, unspecified: Secondary | ICD-10-CM | POA: Diagnosis not present

## 2022-01-17 DIAGNOSIS — M545 Low back pain, unspecified: Secondary | ICD-10-CM

## 2022-01-17 DIAGNOSIS — K219 Gastro-esophageal reflux disease without esophagitis: Secondary | ICD-10-CM

## 2022-01-17 DIAGNOSIS — K2100 Gastro-esophageal reflux disease with esophagitis, without bleeding: Secondary | ICD-10-CM

## 2022-01-17 DIAGNOSIS — Z23 Encounter for immunization: Secondary | ICD-10-CM | POA: Diagnosis not present

## 2022-01-17 DIAGNOSIS — Z0000 Encounter for general adult medical examination without abnormal findings: Secondary | ICD-10-CM | POA: Diagnosis not present

## 2022-01-17 DIAGNOSIS — D509 Iron deficiency anemia, unspecified: Secondary | ICD-10-CM

## 2022-01-17 DIAGNOSIS — D649 Anemia, unspecified: Secondary | ICD-10-CM

## 2022-01-17 DIAGNOSIS — K257 Chronic gastric ulcer without hemorrhage or perforation: Secondary | ICD-10-CM

## 2022-01-17 DIAGNOSIS — I10 Essential (primary) hypertension: Secondary | ICD-10-CM

## 2022-01-17 LAB — HEPATIC FUNCTION PANEL
ALT: 16 U/L (ref 0–35)
AST: 18 U/L (ref 0–37)
Albumin: 4.3 g/dL (ref 3.5–5.2)
Alkaline Phosphatase: 61 U/L (ref 39–117)
Bilirubin, Direct: 0.1 mg/dL (ref 0.0–0.3)
Total Bilirubin: 0.7 mg/dL (ref 0.2–1.2)
Total Protein: 6.6 g/dL (ref 6.0–8.3)

## 2022-01-17 LAB — URINALYSIS, ROUTINE W REFLEX MICROSCOPIC
Bilirubin Urine: NEGATIVE
Hgb urine dipstick: NEGATIVE
Nitrite: NEGATIVE
Specific Gravity, Urine: 1.01 (ref 1.000–1.030)
Total Protein, Urine: NEGATIVE
Urine Glucose: NEGATIVE
Urobilinogen, UA: 0.2 (ref 0.0–1.0)
pH: 6 (ref 5.0–8.0)

## 2022-01-17 LAB — BASIC METABOLIC PANEL
BUN: 26 mg/dL — ABNORMAL HIGH (ref 6–23)
CO2: 26 mEq/L (ref 19–32)
Calcium: 9.7 mg/dL (ref 8.4–10.5)
Chloride: 101 mEq/L (ref 96–112)
Creatinine, Ser: 1.47 mg/dL — ABNORMAL HIGH (ref 0.40–1.20)
GFR: 34.58 mL/min — ABNORMAL LOW (ref >60.00)
Glucose, Bld: 102 mg/dL — ABNORMAL HIGH (ref 70–99)
Potassium: 4.7 mEq/L (ref 3.5–5.1)
Sodium: 141 mEq/L (ref 135–145)

## 2022-01-17 LAB — LIPID PANEL
Cholesterol: 180 mg/dL (ref 0–200)
HDL: 66.2 mg/dL (ref >39.00)
LDL Cholesterol: 93 mg/dL (ref 0–99)
NonHDL: 113.71
Total CHOL/HDL Ratio: 3
Triglycerides: 103 mg/dL (ref 0.0–149.0)
VLDL: 20.6 mg/dL (ref 0.0–40.0)

## 2022-01-17 LAB — TSH: TSH: 2.87 u[IU]/mL (ref 0.35–5.50)

## 2022-01-17 LAB — HEMOGLOBIN A1C: Hgb A1c MFr Bld: 5.9 % (ref 4.6–6.5)

## 2022-01-17 NOTE — Progress Notes (Unsigned)
Patient ID: Amanda Browning, female   DOB: 01/16/1946, 76 y.o.   MRN: [id number]   Subjective:    Patient ID: Amanda Browning, female    DOB: 12/01/1945, 76 y.o.   MRN: [id number]   Patient here for physical exam.    HPI Here for her physical exam. Saw Dr Rockey Situ 11/29/21 - f/u afib.  Continues on eliquis, toprol and diltiazem.  Was given parameters for weight and diuretic.  Is doing well with this regimen.  Averages 2-3x/week - 77m tablet with potassium. No other changes made.  Taking omeprazole. Having some increased issues with swallowing.  Food will hang up and then feel like it is expanding.  Has a large hiatal hernia.  Appt made with Dr TAlice Reichertfor 02/24/22 - pts request - to discuss.  Also due colonoscopy.  Discussed eating small meals and taking small bites.  Using CPAP.  No chest pain or sob reported.  No increased heart rate or palpitations.  Low back pain.  Notices more when standing for a long period of time and when very active.  Has been an issue for a while, but worsened recently - this week. Discussed benefiber - to regulate bowels.      Past Medical History:  Diagnosis Date   Anemia    Chicken pox    Colon polyps    Complication of anesthesia    doesn't take much anesthesia to put pt to sleep, slow to wake up   DDD (degenerative disc disease), cervical 02/05/2013   C5-C6 and C6-C7   GERD (gastroesophageal reflux disease)    Heart murmur    History of atrial fibrillation    History of chicken pox    Hypercholesterolemia    Hyperlipidemia    Hypertension    Hypothyroidism    Migraines    PONV (postoperative nausea and vomiting)    Sleep apnea    Squamous cell skin cancer    Past Surgical History:  Procedure Laterality Date   BREAST BIOPSY Right 2012   stereotactic   CARDIOVERSION N/A 04/29/2021   Procedure: CARDIOVERSION;  Surgeon: GMinna Merritts MD;  Location: [facility];  Service: Cardiovascular;  Laterality: N/A;   CATARACT EXTRACTION W/PHACO Left 09/06/2021    Procedure: CATARACT EXTRACTION PHACO AND INTRAOCULAR LENS PLACEMENT (IOC) LEFT 6.60 00:43.8;  Surgeon: KEulogio Bear MD;  [facility]  Service: Ophthalmology;  Laterality: Left;   CATARACT EXTRACTION W/PHACO Right 10/04/2021   Procedure: CATARACT EXTRACTION PHACO AND INTRAOCULAR LENS PLACEMENT (IOC) RIGHT 6.08 00:45.0;  Surgeon: KEulogio Bear MD;  Location: [facility]  Service: Ophthalmology;  Laterality: Right;   COLONOSCOPY WITH PROPOFOL N/A 02/23/2018   Procedure: COLONOSCOPY WITH PROPOFOL;  Surgeon: EManya Silvas MD;  [facility]  Service: Endoscopy;  Laterality: N/A;   ESOPHAGOGASTRODUODENOSCOPY N/A 03/15/2021   Procedure: ESOPHAGOGASTRODUODENOSCOPY (EGD);  Surgeon: VLin Landsman MD;  [facility]  Service: Gastroenterology;  Laterality: N/A;   KNEE ARTHROPLASTY Right 04/17/2017   Procedure: COMPUTER ASSISTED TOTAL KNEE ARTHROPLASTY;  Surgeon: HDereck Leep MD;  Location: [facility];  Service: Orthopedics;  Laterality: Right;   SKIN BIOPSY     VAGINAL HYSTERECTOMY  1994   ovaries not removed   WISDOM TOOTH EXTRACTION     Family History  Problem Relation Age of Onset   Heart disease Mother    Stroke Mother    Hypertension Mother    Heart attack Mother    Heart disease Father  enlarged heart   Stroke Father    Hypertension Father    Hyperlipidemia Father    Heart attack Father    Ovarian cancer Paternal Aunt    Breast cancer Sister 65   Cancer Sister        Breast Cancer   Diabetes type II Brother    Hypertension Brother    Colon cancer Neg Hx    Basal cell carcinoma Neg Hx    Melanoma Neg Hx    Squamous cell carcinoma Neg Hx    Social History   Socioeconomic History   Marital status: Divorced    Spouse name: Not on file   Number of children: 2   Years of education: 12   Highest education level: High school graduate  Occupational History   Not on file  Tobacco Use   Smoking  status: Never   Smokeless tobacco: Never  Vaping Use   Vaping Use: Never used  Substance and Sexual Activity   Alcohol use: No    Alcohol/week: 0.0 standard drinks of alcohol   Drug use: No   Sexual activity: Never  Other Topics Concern   Not on file  Social History Narrative   Full Code   Alcohol - none   Never smoker and no smokeless tobacco   2 daughters   Social Determinants of Health   Financial Resource Strain: Low Risk  (07/28/2017)   Overall Financial Resource Strain (CARDIA)    Difficulty of Paying Living Expenses: Not hard at all  Food Insecurity: No Food Insecurity (07/28/2017)   Hunger Vital Sign    Worried About Running Out of Food in the Last Year: Never true    Ran Out of Food in the Last Year: Never true  Transportation Needs: No Transportation Needs (07/28/2017)   PRAPARE - Hydrologist (Medical): No    Lack of Transportation (Non-Medical): No  Physical Activity: Insufficiently Active (07/28/2017)   Exercise Vital Sign    Days of Exercise per Week: 4 days    Minutes of Exercise per Session: 30 min  Stress: No Stress Concern Present (07/28/2017)   [place]    Feeling of Stress : Not at all  Social Connections: Not on file     Review of Systems  Constitutional:  Negative for appetite change and unexpected weight change.  HENT:  Negative for congestion and sinus pressure.   Respiratory:  Negative for cough, chest tightness and shortness of breath.   Cardiovascular:  Negative for chest pain, palpitations and leg swelling.  Gastrointestinal:  Negative for nausea and vomiting.       Problems with swallowing as outlined.   Genitourinary:  Negative for difficulty urinating and dysuria.  Musculoskeletal:  Positive for back pain. Negative for myalgias and neck pain.  Skin:  Negative for color change and rash.  Neurological:  Negative for dizziness, light-headedness and  headaches.  Psychiatric/Behavioral:  Negative for agitation and dysphoric mood.        Objective:     BP 130/80 (BP Location: Left Arm, Patient Position: Sitting, Cuff Size: Large)   Pulse 60   Temp 98.8 F (37.1 C) (Oral)   Ht 5' 3" (1.6 m)   Wt 210 lb 9.6 oz (95.5 kg)   SpO2 95%   BMI 37.31 kg/m  Wt Readings from Last 3 Encounters:  01/17/22 210 lb 9.6 oz (95.5 kg)  11/29/21 211 lb 8 oz (95.9 kg)  10/04/21  209 lb 7 oz (95 kg)    Physical Exam Vitals reviewed.  Constitutional:      General: She is not in acute distress.    Appearance: Normal appearance. She is well-developed.  HENT:     Head: Normocephalic and atraumatic.     Right Ear: External ear normal.     Left Ear: External ear normal.  Eyes:     General: No scleral icterus.       Right eye: No discharge.        Left eye: No discharge.     Conjunctiva/sclera: Conjunctivae normal.  Neck:     Thyroid: No thyromegaly.  Cardiovascular:     Rate and Rhythm: Normal rate and regular rhythm.  Pulmonary:     Effort: No tachypnea, accessory muscle usage or respiratory distress.     Breath sounds: Normal breath sounds. No decreased breath sounds or wheezing.  Chest:  Breasts:    Right: No inverted nipple, mass, nipple discharge or tenderness (no axillary adenopathy).     Left: No inverted nipple, mass, nipple discharge or tenderness (no axilarry adenopathy).  Abdominal:     General: Bowel sounds are normal.     Palpations: Abdomen is soft.     Tenderness: There is no abdominal tenderness.  Musculoskeletal:        General: No swelling or tenderness.     Cervical back: Neck supple.  Lymphadenopathy:     Cervical: No cervical adenopathy.  Skin:    Findings: No erythema or rash.  Neurological:     Mental Status: She is alert and oriented to person, place, and time.  Psychiatric:        Mood and Affect: Mood normal.        Behavior: Behavior normal.      Outpatient Encounter Medications as of 01/17/2022   Medication Sig   acetaminophen (TYLENOL) 325 MG tablet Take 650 mg by mouth 4 (four) times daily as needed.   apixaban (ELIQUIS) 5 MG TABS tablet Take 1 tablet (5 mg total) by mouth 2 (two) times daily.   cetirizine (ZYRTEC) 10 MG tablet Take 10 mg by mouth every evening.   diltiazem (CARDIZEM CD) 240 MG 24 hr capsule TAKE 1 CAPSULE (240 MG TOTAL) BY MOUTH DAILY.   ferrous sulfate 325 (65 FE) MG tablet Take 325 mg by mouth in the morning.   levothyroxine (SYNTHROID) 75 MCG tablet TAKE 1 TABLET EVERY DAY AT 6:00 AM   losartan (COZAAR) 25 MG tablet TAKE 1/2 TABLET EVERY MORNING   magnesium oxide (MAG-OX) 400 MG tablet Take 1 tablet (400 mg total) by mouth 2 (two) times daily.   metoprolol tartrate (LOPRESSOR) 100 MG tablet TAKE 1 TABLET TWICE DAILY   Multiple Vitamin (MULTIVITAMIN WITH MINERALS) TABS tablet Take 1 tablet by mouth in the morning.   omeprazole (PRILOSEC) 20 MG capsule TAKE 1 CAPSULE TWICE DAILY BEFORE MEALS   potassium chloride (MICRO-K) 10 MEQ CR capsule Take 10 mEq with torsemide 20 mg, take 20 Meq with torsemide 40 mg   rosuvastatin (CRESTOR) 10 MG tablet Take 1 tablet (10 mg total) by mouth daily.   sertraline (ZOLOFT) 50 MG tablet TAKE 1 TABLET DAILY   spironolactone (ALDACTONE) 25 MG tablet TAKE 1 TABLET EVERY DAY   torsemide (DEMADEX) 20 MG tablet Weight <205 no torsemide Weight 205-211 take torsemide 20 mg daily and potassium 10 meq Weight >212 take torsemide 40 mg daily with potassium 20 mEq   No facility-administered encounter medications on file  as of 01/17/2022.     Lab Results  Component Value Date   WBC 11.4 (H) 03/30/2021   HGB 10.3 (L) 04/02/2021   HCT 32.4 (L) 04/02/2021   PLT 316.0 03/30/2021   GLUCOSE 102 (H) 01/17/2022   CHOL 180 01/17/2022   TRIG 103.0 01/17/2022   HDL 66.20 01/17/2022   LDLDIRECT 131.0 03/19/2013   LDLCALC 93 01/17/2022   ALT 16 01/17/2022   AST 18 01/17/2022   NA 141 01/17/2022   K 4.7 01/17/2022   CL 101 01/17/2022    CREATININE 1.47 (H) 01/17/2022   BUN 26 (H) 01/17/2022   CO2 26 01/17/2022   TSH 2.87 01/17/2022   INR 1.2 03/12/2021   HGBA1C 5.9 01/17/2022       Assessment & Plan:   Problem List Items Addressed This Visit     Anemia    Follow cbc.       Atrial fibrillation ([place])    On eliquis.  Continues on diltiazem and metoprolol.        Cameron ulcer, chronic    Found on EGD.  PPI bid.  Upper symptoms appear to be controlled. Arrange f/u with GI.  On eliquis now for recent PE and afib.  Has f/u with Dr Alice Reichert.       GERD (gastroesophageal reflux disease)    Continue prilosec.  No upper symptoms.        Health care maintenance    Physical today 01/17/22.  Mammogram 04/16/21 - Birads I.  Colonoscopy 02/2018 - recommended f/u in 5 years.       Hiatal hernia   Hiatal hernia with GERD and esophagitis    Found to have large hiatal hernia and ulcer with no active bleeding.  Saw GI.  Recommended PPI bid.  Discussed with GI.  Had recommended surgery evaluation. Pt request f/u with GI to discuss. Has appt with Dr Alice Reichert.       Hypercholesterolemia    On crestor.  Low cholesterol diet and exercise.  Follow lipid panel and liver function tests.       Relevant Orders   Lipid panel (Completed)   Hepatic function panel (Completed)   Hyperglycemia    Low carb diet and exercise.  Follow met b and a1c.   Lab Results  Component Value Date   HGBA1C 5.9 01/17/2022       Relevant Orders   Hemoglobin A1c (Completed)   Hypertension - Primary    Currently on cardizem and lopressor. Takes Torsemide as outlined.  Blood pressure controlled.  Check metabolic panel.       Relevant Orders   Basic metabolic panel (Completed)   Hypothyroidism    On synthroid.  Follow tsh.       Relevant Orders   TSH (Completed)   Iron deficiency anemia    S/p venofer.  Follow cbc.        Low back pain    Pain as outlined.  Given a persistent intermittent issue, check L-S spine xray.  Further treatment  pending results.       Relevant Orders   DG Lumbar Spine 2-3 Views (Completed)   Urinalysis, Routine w reflex microscopic (Completed)   OSA (obstructive sleep apnea)    Moderate to severe OSA.  Seeing pulmonary.  Recommended CPAP (autoset 5-15).        Other Visit Diagnoses     Need for immunization against influenza       Relevant Orders   Flu Vaccine QUAD High Dose(Fluad) (  Completed)        Einar Pheasant, MD

## 2022-01-17 NOTE — Assessment & Plan Note (Signed)
Physical today 01/17/22.  Mammogram 04/16/21 - Birads I.  Colonoscopy 02/2018 - recommended f/u in 5 years.

## 2022-01-18 ENCOUNTER — Other Ambulatory Visit: Payer: Self-pay | Admitting: Internal Medicine

## 2022-01-18 DIAGNOSIS — R911 Solitary pulmonary nodule: Secondary | ICD-10-CM

## 2022-01-18 NOTE — Progress Notes (Signed)
Order placed for cxr.  

## 2022-01-20 ENCOUNTER — Encounter: Payer: Self-pay | Admitting: Internal Medicine

## 2022-01-20 NOTE — Assessment & Plan Note (Signed)
Found on EGD.  PPI bid.  Upper symptoms appear to be controlled. Arrange f/u with GI.  On eliquis now for recent PE and afib.  Has f/u with Dr Alice Reichert.

## 2022-01-20 NOTE — Assessment & Plan Note (Signed)
Continue prilosec.  No upper symptoms.

## 2022-01-20 NOTE — Assessment & Plan Note (Signed)
On crestor.  Low cholesterol diet and exercise.  Follow lipid panel and liver function tests.   

## 2022-01-20 NOTE — Assessment & Plan Note (Signed)
Follow cbc.  

## 2022-01-20 NOTE — Assessment & Plan Note (Signed)
On synthroid.  Follow tsh.   

## 2022-01-20 NOTE — Assessment & Plan Note (Signed)
On eliquis.  Continues on diltiazem and metoprolol.   

## 2022-01-20 NOTE — Assessment & Plan Note (Signed)
Pain as outlined.  Given a persistent intermittent issue, check L-S spine xray.  Further treatment pending results.

## 2022-01-20 NOTE — Assessment & Plan Note (Signed)
Moderate to severe OSA.  Seeing pulmonary.  Recommended CPAP (autoset 5-15).   

## 2022-01-20 NOTE — Assessment & Plan Note (Signed)
S/p venofer.  Follow cbc.   

## 2022-01-20 NOTE — Assessment & Plan Note (Signed)
Found to have large hiatal hernia and ulcer with no active bleeding.  Saw GI.  Recommended PPI bid.  Discussed with GI.  Had recommended surgery evaluation. Pt request f/u with GI to discuss. Has appt with Dr Alice Reichert.

## 2022-01-20 NOTE — Assessment & Plan Note (Signed)
Currently on cardizem and lopressor. Takes Torsemide as outlined.  Blood pressure controlled.  Check metabolic panel.  

## 2022-01-20 NOTE — Assessment & Plan Note (Signed)
Low carb diet and exercise.  Follow met b and a1c.   Lab Results  Component Value Date   HGBA1C 5.9 01/17/2022

## 2022-01-27 ENCOUNTER — Telehealth: Payer: Self-pay | Admitting: Internal Medicine

## 2022-01-27 DIAGNOSIS — R944 Abnormal results of kidney function studies: Secondary | ICD-10-CM

## 2022-01-27 NOTE — Telephone Encounter (Signed)
Patient has a lab appt 02/02/2022, there are no orders in.

## 2022-01-28 NOTE — Telephone Encounter (Signed)
Order placed for f/u lab.   

## 2022-01-28 NOTE — Addendum Note (Signed)
Addended by: Alisa Graff on: 01/28/2022 03:44 AM   Modules accepted: Orders

## 2022-02-02 ENCOUNTER — Other Ambulatory Visit (INDEPENDENT_AMBULATORY_CARE_PROVIDER_SITE_OTHER): Payer: Medicare HMO

## 2022-02-02 DIAGNOSIS — R944 Abnormal results of kidney function studies: Secondary | ICD-10-CM | POA: Diagnosis not present

## 2022-02-02 LAB — BASIC METABOLIC PANEL
BUN: 18 mg/dL (ref 6–23)
CO2: 32 mEq/L (ref 19–32)
Calcium: 9.6 mg/dL (ref 8.4–10.5)
Chloride: 101 mEq/L (ref 96–112)
Creatinine, Ser: 1.11 mg/dL (ref 0.40–1.20)
GFR: 48.42 mL/min — ABNORMAL LOW (ref >60.00)
Glucose, Bld: 111 mg/dL — ABNORMAL HIGH (ref 70–99)
Potassium: 4.7 mEq/L (ref 3.5–5.1)
Sodium: 139 mEq/L (ref 135–145)

## 2022-02-03 ENCOUNTER — Other Ambulatory Visit: Payer: Medicare HMO

## 2022-02-03 ENCOUNTER — Ambulatory Visit (INDEPENDENT_AMBULATORY_CARE_PROVIDER_SITE_OTHER): Payer: Medicare HMO

## 2022-02-03 DIAGNOSIS — R911 Solitary pulmonary nodule: Secondary | ICD-10-CM | POA: Diagnosis not present

## 2022-02-03 DIAGNOSIS — K449 Diaphragmatic hernia without obstruction or gangrene: Secondary | ICD-10-CM | POA: Diagnosis not present

## 2022-02-24 DIAGNOSIS — K529 Noninfective gastroenteritis and colitis, unspecified: Secondary | ICD-10-CM | POA: Diagnosis not present

## 2022-02-24 DIAGNOSIS — Z86010 Personal history of colonic polyps: Secondary | ICD-10-CM | POA: Diagnosis not present

## 2022-02-24 DIAGNOSIS — K591 Functional diarrhea: Secondary | ICD-10-CM | POA: Diagnosis not present

## 2022-02-24 DIAGNOSIS — D500 Iron deficiency anemia secondary to blood loss (chronic): Secondary | ICD-10-CM | POA: Diagnosis not present

## 2022-02-24 DIAGNOSIS — K219 Gastro-esophageal reflux disease without esophagitis: Secondary | ICD-10-CM | POA: Diagnosis not present

## 2022-02-25 ENCOUNTER — Telehealth: Payer: Self-pay | Admitting: Cardiovascular Disease

## 2022-02-25 NOTE — Telephone Encounter (Signed)
   Pre-operative Risk Assessment    Patient Name: Amanda Browning  DOB: [birth year] MRN: [id number]      Request for Surgical Clearance    Procedure:   Colon/EGD  Date of Surgery:  Clearance 06/15/22                                 Surgeon:  not indicated  Surgeon's Group or Practice Name:  [facility]  Phone number:  [phone] Fax number:  [phone]   Type of Clearance Requested:   - Pharmacy:  Hold Apixaban (Eliquis) instructions    Type of Anesthesia:  Not Indicated   Additional requests/questions:    Manfred Arch   02/25/2022, 9:58 AM

## 2022-03-01 NOTE — Telephone Encounter (Signed)
   Patient Name: Amanda Browning  DOB: [birth year] MRN: [id number]  Primary Cardiologist: None  Chart reviewed as part of pre-operative protocol coverage. Pre-op clearance already addressed by colleagues in earlier phone notes. To summarize recommendations:  Patient with diagnosis of atrial fibriliation on Eliquis for anticoagulation.     Procedure: Colon/EGD  Date of procedure:  06/15/22        CHA2DS2-VASc Score = 5   This indicates a 7.2% annual risk of stroke. The patient's score is based upon: CHF History: 1 HTN History: 1 Diabetes History: 0 Stroke History: 0 Vascular Disease History: 0 Age Score: 2 Gender Score: 1     CrCl 47 ml/min (Adj BW) (SrCr 1.11 02/02/2022) Platelet count 198 (02/24/2022)     Per office protocol, patient can hold Eliquis for 1 days prior to procedure.   Patient will not need bridging with Lovenox (enoxaparin) around procedure      Medical clearance not requested.   Will route this bundled recommendation to requesting provider via Epic fax function and remove from pre-op pool. Please call with questions.  Elgie Collard, PA-C 03/01/2022, 12:57 PM

## 2022-03-01 NOTE — Telephone Encounter (Signed)
Patient with diagnosis of atrial fibriliation on Eliquis for anticoagulation.    Procedure: Colon/EGD  Date of procedure:  06/15/22      CHA2DS2-VASc Score = 5   This indicates a 7.2% annual risk of stroke. The patient's score is based upon: CHF History: 1 HTN History: 1 Diabetes History: 0 Stroke History: 0 Vascular Disease History: 0 Age Score: 2 Gender Score: 1    CrCl 47 ml/min (Adj BW) (SrCr 1.11 02/02/2022) Platelet count 198 (02/24/2022)   Per office protocol, patient can hold Eliquis for 1 days prior to procedure.   Patient will not need bridging with Lovenox (enoxaparin) around procedure.  **This guidance is not considered finalized until pre-operative APP has relayed final recommendations.**

## 2022-03-18 ENCOUNTER — Other Ambulatory Visit: Payer: Self-pay | Admitting: Internal Medicine

## 2022-03-18 DIAGNOSIS — Z1231 Encounter for screening mammogram for malignant neoplasm of breast: Secondary | ICD-10-CM

## 2022-03-23 ENCOUNTER — Other Ambulatory Visit: Payer: Self-pay

## 2022-03-23 ENCOUNTER — Ambulatory Visit: Payer: Medicare HMO | Admitting: Anesthesiology

## 2022-03-23 ENCOUNTER — Ambulatory Visit
Admission: RE | Admit: 2022-03-23 | Discharge: 2022-03-23 | Disposition: A | Discharge status: Home / Self Care | Payer: Medicare HMO | Attending: Internal Medicine | Admitting: Internal Medicine

## 2022-03-23 ENCOUNTER — Encounter
Admission: RE | Disposition: A | Discharge status: Home / Self Care | Payer: Self-pay | Source: Home / Self Care | Attending: Internal Medicine

## 2022-03-23 ENCOUNTER — Encounter: Payer: Self-pay | Admitting: Internal Medicine

## 2022-03-23 DIAGNOSIS — D500 Iron deficiency anemia secondary to blood loss (chronic): Secondary | ICD-10-CM | POA: Insufficient documentation

## 2022-03-23 DIAGNOSIS — K640 First degree hemorrhoids: Secondary | ICD-10-CM | POA: Insufficient documentation

## 2022-03-23 DIAGNOSIS — K5289 Other specified noninfective gastroenteritis and colitis: Secondary | ICD-10-CM | POA: Diagnosis not present

## 2022-03-23 DIAGNOSIS — I5033 Acute on chronic diastolic (congestive) heart failure: Secondary | ICD-10-CM | POA: Diagnosis not present

## 2022-03-23 DIAGNOSIS — K2980 Duodenitis without bleeding: Secondary | ICD-10-CM | POA: Diagnosis not present

## 2022-03-23 DIAGNOSIS — K2960 Other gastritis without bleeding: Secondary | ICD-10-CM | POA: Diagnosis not present

## 2022-03-23 DIAGNOSIS — K591 Functional diarrhea: Secondary | ICD-10-CM | POA: Diagnosis not present

## 2022-03-23 DIAGNOSIS — K2970 Gastritis, unspecified, without bleeding: Secondary | ICD-10-CM | POA: Diagnosis not present

## 2022-03-23 DIAGNOSIS — K648 Other hemorrhoids: Secondary | ICD-10-CM | POA: Diagnosis not present

## 2022-03-23 DIAGNOSIS — K3189 Other diseases of stomach and duodenum: Secondary | ICD-10-CM | POA: Diagnosis not present

## 2022-03-23 DIAGNOSIS — K2990 Gastroduodenitis, unspecified, without bleeding: Secondary | ICD-10-CM | POA: Diagnosis not present

## 2022-03-23 DIAGNOSIS — K449 Diaphragmatic hernia without obstruction or gangrene: Secondary | ICD-10-CM | POA: Diagnosis not present

## 2022-03-23 DIAGNOSIS — I110 Hypertensive heart disease with heart failure: Secondary | ICD-10-CM | POA: Diagnosis not present

## 2022-03-23 DIAGNOSIS — K5730 Diverticulosis of large intestine without perforation or abscess without bleeding: Secondary | ICD-10-CM | POA: Diagnosis not present

## 2022-03-23 HISTORY — PX: COLONOSCOPY: SHX5424

## 2022-03-23 HISTORY — PX: ESOPHAGOGASTRODUODENOSCOPY: SHX5428

## 2022-03-23 SURGERY — COLONOSCOPY
Anesthesia: General

## 2022-03-23 MED ORDER — PROPOFOL 500 MG/50ML IV EMUL
INTRAVENOUS | Status: DC | PRN
  Administered 2022-03-23: 75 ug/kg/min via INTRAVENOUS

## 2022-03-23 MED ORDER — ONDANSETRON HCL 4 MG/2ML IJ SOLN
INTRAMUSCULAR | Status: AC
  Filled 2022-03-23: qty 2

## 2022-03-23 MED ORDER — ONDANSETRON HCL 4 MG/2ML IJ SOLN
INTRAMUSCULAR | Status: DC | PRN
  Administered 2022-03-23: 4 mg via INTRAVENOUS

## 2022-03-23 MED ORDER — PROPOFOL 10 MG/ML IV BOLUS
INTRAVENOUS | Status: DC | PRN
  Administered 2022-03-23: 50 mg via INTRAVENOUS
  Administered 2022-03-23: 20 mg via INTRAVENOUS

## 2022-03-23 MED ORDER — LIDOCAINE HCL (CARDIAC) PF 100 MG/5ML IV SOSY
PREFILLED_SYRINGE | INTRAVENOUS | Status: DC | PRN
  Administered 2022-03-23: 50 mg via INTRAVENOUS

## 2022-03-23 MED ORDER — SUCCINYLCHOLINE CHLORIDE 200 MG/10ML IV SOSY
PREFILLED_SYRINGE | INTRAVENOUS | Status: AC
  Filled 2022-03-23: qty 10

## 2022-03-23 MED ORDER — LIDOCAINE HCL (PF) 2 % IJ SOLN
INTRAMUSCULAR | Status: AC
  Filled 2022-03-23: qty 5

## 2022-03-23 MED ORDER — ALBUTEROL SULFATE HFA 108 (90 BASE) MCG/ACT IN AERS
INHALATION_SPRAY | RESPIRATORY_TRACT | Status: AC
  Filled 2022-03-23: qty 6.7

## 2022-03-23 MED ORDER — GLYCOPYRROLATE 0.2 MG/ML IJ SOLN
INTRAMUSCULAR | Status: DC | PRN
  Administered 2022-03-23: .2 mg via INTRAVENOUS

## 2022-03-23 MED ORDER — DEXMEDETOMIDINE HCL IN NACL 80 MCG/20ML IV SOLN
INTRAVENOUS | Status: AC
  Filled 2022-03-23: qty 20

## 2022-03-23 MED ORDER — PHENYLEPHRINE 80 MCG/ML (10ML) SYRINGE FOR IV PUSH (FOR BLOOD PRESSURE SUPPORT)
PREFILLED_SYRINGE | INTRAVENOUS | Status: AC
  Filled 2022-03-23: qty 10

## 2022-03-23 MED ORDER — SODIUM CHLORIDE 0.9 % IV SOLN: INTRAVENOUS | Status: DC

## 2022-03-23 MED ORDER — GLYCOPYRROLATE 0.2 MG/ML IJ SOLN
INTRAMUSCULAR | Status: AC
  Filled 2022-03-23: qty 1

## 2022-03-23 NOTE — Anesthesia Preprocedure Evaluation (Addendum)
Anesthesia Evaluation  Patient identified by MRN, date of birth, ID band Patient awake    Reviewed: Allergy & Precautions, NPO status , Patient's Chart, lab work & pertinent test results  History of Anesthesia Complications (+) PONV and history of anesthetic complications  Airway Mallampati: III  TM Distance: >3 FB Neck ROM: full    Dental  (+) Teeth Intact   Pulmonary sleep apnea  Hx of PE 03/2021, on Eliquis Last dose Sunday    Pulmonary exam normal        Cardiovascular hypertension, + dysrhythmias Atrial Fibrillation      Neuro/Psych  Headaches  Neuromuscular disease (DDD)  negative psych ROS   GI/Hepatic Neg liver ROS, hiatal hernia, PUD,GERD  Medicated,,  Endo/Other  Hypothyroidism    Renal/GU negative Renal ROS  negative genitourinary   Musculoskeletal  (+) Arthritis ,    Abdominal   Peds  Hematology  (+) Blood dyscrasia, anemia   Anesthesia Other Findings Past Medical History: No date: Anemia No date: Chicken pox No date: Colon polyps No date: Complication of anesthesia     Comment:  doesn't take much anesthesia to put pt to sleep, slow to              wake up 02/05/2013: DDD (degenerative disc disease), cervical     Comment:  C5-C6 and C6-C7 No date: GERD (gastroesophageal reflux disease) No date: Heart murmur No date: History of atrial fibrillation No date: History of chicken pox No date: Hypercholesterolemia No date: Hyperlipidemia No date: Hypertension No date: Hypothyroidism No date: Migraines No date: PONV (postoperative nausea and vomiting) No date: Sleep apnea No date: Squamous cell skin cancer  Past Surgical History: 2012: BREAST BIOPSY; Right     Comment:  stereotactic 04/29/2021: CARDIOVERSION; N/A     Comment:  Procedure: CARDIOVERSION;  Surgeon: [name],               MD;  Location: [facility];  Service: Cardiovascular;                Laterality: N/A; 09/06/2021:  CATARACT EXTRACTION W/PHACO; Left     Comment:  Procedure: CATARACT EXTRACTION PHACO AND INTRAOCULAR               LENS PLACEMENT (IOC) LEFT 6.60 00:43.8;  Surgeon: Eulogio Bear, MD;  Location: [place];                Service: Ophthalmology;  Laterality: Left; 10/04/2021: CATARACT EXTRACTION W/PHACO; Right     Comment:  Procedure: CATARACT EXTRACTION PHACO AND INTRAOCULAR               LENS PLACEMENT (IOC) RIGHT 6.08 00:45.0;  Surgeon: Eulogio Bear, MD;  Location: [place];                Service: Ophthalmology;  Laterality: Right; 02/23/2018: COLONOSCOPY WITH PROPOFOL; N/A     Comment:  Procedure: COLONOSCOPY WITH PROPOFOL;  Surgeon: Manya Silvas, MD;  Location: [facility];  Service:               Endoscopy;  Laterality: N/A; 03/15/2021: ESOPHAGOGASTRODUODENOSCOPY; N/A     Comment:  Procedure: ESOPHAGOGASTRODUODENOSCOPY (EGD);  Surgeon:  Lin Landsman, MD;  Location: [facility];                Service: Gastroenterology;  Laterality: N/A; 04/17/2017: KNEE ARTHROPLASTY; Right     Comment:  Procedure: COMPUTER ASSISTED TOTAL KNEE ARTHROPLASTY;                Surgeon: Dereck Leep, MD;  Location: [facility];                Service: Orthopedics;  Laterality: Right; No date: SKIN BIOPSY 1994: VAGINAL HYSTERECTOMY     Comment:  ovaries not removed No date: WISDOM TOOTH EXTRACTION  BMI    Body Mass Index: 37.20 kg/m      Reproductive/Obstetrics negative OB ROS                             Anesthesia Physical Anesthesia Plan  ASA: 3  Anesthesia Plan: General   Post-op Pain Management: Minimal or no pain anticipated   Induction: Intravenous  PONV Risk Score and Plan: Propofol infusion and TIVA  Airway Management Planned: Natural Airway and Nasal Cannula  Additional Equipment:   Intra-op Plan:   Post-operative Plan:   Informed Consent: I have  reviewed the patients History and Physical, chart, labs and discussed the procedure including the risks, benefits and alternatives for the proposed anesthesia with the patient or authorized representative who has indicated his/her understanding and acceptance.     Dental Advisory Given  Plan Discussed with: Anesthesiologist, CRNA and Surgeon  Anesthesia Plan Comments: (Patient consented for risks of anesthesia including but not limited to:  - adverse reactions to medications - risk of airway placement if required - damage to eyes, teeth, lips or other oral mucosa - nerve damage due to positioning  - sore throat or hoarseness - Damage to heart, brain, nerves, lungs, other parts of body or loss of life  Patient voiced understanding.)        Anesthesia Quick Evaluation

## 2022-03-23 NOTE — Transfer of Care (Signed)
Immediate Anesthesia Transfer of Care Note  Patient: Amanda Browning  Procedure(s) Performed: COLONOSCOPY ESOPHAGOGASTRODUODENOSCOPY (EGD)  Patient Location: PACU  Anesthesia Type:General  Level of Consciousness: sedated  Airway & Oxygen Therapy: Patient Spontanous Breathing and Patient connected to nasal cannula oxygen  Post-op Assessment: Report given to RN and Post -op Vital signs reviewed and stable  Post vital signs: Reviewed and stable  Last Vitals:  Vitals Value Taken Time  BP 98/46 03/23/22 1509  Temp 35.8 C 03/23/22 1509  Pulse 57 03/23/22 1511  Resp 17 03/23/22 1511  SpO2 99 % 03/23/22 1511  Vitals shown include unvalidated device data.  Last Pain:  Vitals:   03/23/22 1509  TempSrc: Temporal  PainSc: Asleep         Complications: No notable events documented.

## 2022-03-23 NOTE — Interval H&P Note (Signed)
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  The various methods of treatment have been discussed with the patient and family. After consideration of risks, benefits and other options for treatment, the patient has consented to  Procedure(s): COLONOSCOPY (N/A) ESOPHAGOGASTRODUODENOSCOPY (EGD) (N/A) as a surgical intervention.  The patient's history has been reviewed, patient examined, no change in status, stable for surgery.  I have reviewed the patient's chart and labs.  Questions were answered to the patient's satisfaction.     [place], [place]

## 2022-03-23 NOTE — Op Note (Signed)
[facility] Gastroenterology Patient Name: Amanda Browning Procedure Date: 03/23/2022 2:22 PM MRN: [id number] Account #: [id number] Date of Birth: 07-05-1945 Admit Type: Outpatient Age: 76 Room: [facility] ENDO ROOM 2 Gender: Female Note Status: Finalized Instrument Name: Upper Endoscope [id number] Procedure:             Upper GI endoscopy Indications:           Iron deficiency anemia secondary to chronic blood                         loss, Suspected gastro-esophageal reflux disease Providers:             Benay Pike. Alice Reichert MD, MD Referring MD:          Einar Pheasant, MD (Referring MD) Medicines:             Propofol per Anesthesia Complications:         No immediate complications. Procedure:             Pre-Anesthesia Assessment:                        - The risks and benefits of the procedure and the                         sedation options and risks were discussed with the                         patient. All questions were answered and informed                         consent was obtained.                        - Patient identification and proposed procedure were                         verified prior to the procedure by the nurse. The                         procedure was verified in the procedure room.                        - ASA Grade Assessment: III - A patient with severe                         systemic disease.                        - After reviewing the risks and benefits, the patient                         was deemed in satisfactory condition to undergo the                         procedure.                        After obtaining informed consent, the endoscope was  passed under direct vision. Throughout the procedure,                         the patient's blood pressure, pulse, and oxygen                         saturations were monitored continuously. The Endoscope                         was introduced through the mouth, and  advanced to the                         third part of duodenum. The upper GI endoscopy was                         accomplished without difficulty. The patient tolerated                         the procedure well. Findings:      The esophagus was normal.      Patchy mild inflammation characterized by erythema was found in the       gastric antrum. One biopsy was obtained with cold forceps for       Helicobacter pylori testing in the gastric antrum, as well as one biopsy       in the gastric body.      A 6 cm hiatal hernia was present.      Patchy mildly erythematous mucosa without active bleeding and with no       stigmata of bleeding was found in the first portion of the duodenum and       in the second portion of the duodenum. Biopsies were taken with a cold       forceps for histology.      The third portion of the duodenum and fourth portion of the duodenum       were normal.      The exam was otherwise without abnormality. Impression:            - Normal esophagus.                        - Gastritis.                        - 6 cm hiatal hernia.                        - Erythematous duodenopathy. Biopsied.                        - Normal third portion of the duodenum and fourth                         portion of the duodenum.                        - The examination was otherwise normal.                        - Biopsies performed in the gastric antrum and in the  gastric body. Recommendation:        - Await pathology results.                        - Proceed with colonoscopy Procedure Code(s):     --- Professional ---                        [phone], Esophagogastroduodenoscopy, flexible,                         transoral; with biopsy, single or multiple Diagnosis Code(s):     --- Professional ---                        D50.0, Iron deficiency anemia secondary to blood loss                         (chronic)                        K31.89, Other diseases of  stomach and duodenum                        K44.9, Diaphragmatic hernia without obstruction or                         gangrene                        K29.70, Gastritis, unspecified, without bleeding CPT copyright 2022 American Medical Association. All rights reserved. The codes documented in this report are preliminary and upon coder review may  be revised to meet current compliance requirements. Efrain Sella MD, MD 03/23/2022 2:47:43 PM This report has been signed electronically. Number of Addenda: 0 Note Initiated On: 03/23/2022 2:22 PM Estimated Blood Loss:  Estimated blood loss was minimal.      [facility]

## 2022-03-23 NOTE — Op Note (Addendum)
[facility] Gastroenterology Patient Name: Amanda Browning Procedure Date: 03/23/2022 2:21 PM MRN: [id number] Account #: [id number] Date of Birth: 04-14-1946 Admit Type: Outpatient Age: 76 Room: [facility] ENDO ROOM 2 Gender: Female Note Status: Supervisor Override Instrument Name: Jasper Riling [id number] Procedure:             Colonoscopy Indications:           Iron deficiency anemia, Diarrhea Providers:             Benay Pike. Alice Reichert MD, MD Referring MD:          Einar Pheasant, MD (Referring MD) Complications:         No immediate complications. Procedure:             Pre-Anesthesia Assessment:                        - The risks and benefits of the procedure and the                         sedation options and risks were discussed with the                         patient. All questions were answered and informed                         consent was obtained.                        - Patient identification and proposed procedure were                         verified prior to the procedure by the nurse. The                         procedure was verified in the procedure room.                        - ASA Grade Assessment: III - A patient with severe                         systemic disease.                        - After reviewing the risks and benefits, the patient                         was deemed in satisfactory condition to undergo the                         procedure.                        After obtaining informed consent, the colonoscope was                         passed under direct vision. Throughout the procedure,                         the patient's blood pressure, pulse, and oxygen  saturations were monitored continuously. The                         Colonoscope was introduced through the anus and                         advanced to the the terminal ileum, with                         identification of the ileocecal valve. The  colonoscopy                         was somewhat difficult due to restricted mobility of                         the colon, a redundant colon and a tortuous colon.                         Successful completion of the procedure was aided by                         straightening and shortening the scope to obtain bowel                         loop reduction. The patient tolerated the procedure                         well. The quality of the bowel preparation was good                         except the cecum was poor. The quality of the bowel                         preparation was good except the descending colon was                         fair. The quality of the bowel preparation was good                         except the ascending colon was fair. The terminal                         ileum and the ileocecal valve were photographed. Findings:      A few large-mouthed diverticula were found in the sigmoid colon.      Non-bleeding internal hemorrhoids were found during retroflexion. The       hemorrhoids were Grade I (internal hemorrhoids that do not prolapse).      A moderate amount of stool was found in the transverse colon, in the       ascending colon and in the cecum, interfering with visualization. Lavage       of the area was performed using a moderate amount of sterile water,       resulting in clearance with fair visualization.      The exam was otherwise without abnormality.      The terminal ileum appeared normal.      The exam was otherwise without abnormality. Impression:            -  Diverticulosis in the sigmoid colon.                        - Non-bleeding internal hemorrhoids.                        - Stool in the transverse colon, in the ascending                         colon and in the cecum.                        - The examination was otherwise normal.                        - The examined portion of the ileum was normal.                        - The examination was  otherwise normal.                        - No specimens collected. Recommendation:        - Await pathology results from EGD, also performed                         today.                        - Patient has a contact number available for                         emergencies. The signs and symptoms of potential                         delayed complications were discussed with the patient.                         Return to normal activities tomorrow. Written                         discharge instructions were provided to the patient.                        - Resume previous diet.                        - Continue present medications.                        - Await pathology results.                        - Return to GI office in 3 months.                        - The findings and recommendations were discussed with                         the patient.                        - The  findings and recommendations were discussed with                         the patient. Procedure Code(s):     --- Professional ---                        [phone], Colonoscopy, flexible; diagnostic, including                         collection of specimen(s) by brushing or washing, when                         performed (separate procedure) Diagnosis Code(s):     --- Professional ---                        K57.30, Diverticulosis of large intestine without                         perforation or abscess without bleeding                        K64.0, First degree hemorrhoids CPT copyright 2022 American Medical Association. All rights reserved. The codes documented in this report are preliminary and upon coder review may  be revised to meet current compliance requirements. Efrain Sella MD, MD 03/23/2022 3:12:52 PM This report has been signed electronically. Number of Addenda: 0 Note Initiated On: 03/23/2022 2:21 PM Scope Withdrawal Time: 0 hours 4 minutes 8 seconds  Total Procedure Duration: 0 hours 13 minutes 47  seconds  Estimated Blood Loss:  Estimated blood loss: none. Estimated blood loss: none.      [facility]

## 2022-03-23 NOTE — H&P (Signed)
Outpatient short stay form Pre-procedure 03/23/2022 2:22 PM Amanda Browning, M.D.  Primary Physician: Einar Pheasant, M.D.  Reason for visit:  Iron deficiency anemia, functional diarrhea  History of present illness:  76 y/o female presents with persistent, recurrent watery diarrhea with no blood. Iron indices are low suggesting iron deficiency. Denies involuntary weight loss.    Current Facility-Administered Medications:    0.9 %  sodium chloride infusion, , Intravenous, Continuous, [place], Benay Pike, MD, Last Rate: 20 mL/hr at 03/23/22 1345, New Bag at 03/23/22 1345  Medications Prior to Admission  Medication Sig Dispense Refill Last Dose   cetirizine (ZYRTEC) 10 MG tablet Take 10 mg by mouth every evening.   03/22/2022   diltiazem (CARDIZEM CD) 240 MG 24 hr capsule TAKE 1 CAPSULE (240 MG TOTAL) BY MOUTH DAILY. 90 capsule 1 03/23/2022   ferrous sulfate 325 (65 FE) MG tablet Take 325 mg by mouth in the morning.   Past Week   levothyroxine (SYNTHROID) 75 MCG tablet TAKE 1 TABLET EVERY DAY AT 6:00 AM 90 tablet 1 03/23/2022   losartan (COZAAR) 25 MG tablet TAKE 1/2 TABLET EVERY MORNING 45 tablet 2 03/23/2022   magnesium oxide (MAG-OX) 400 MG tablet Take 1 tablet (400 mg total) by mouth 2 (two) times daily. 60 tablet 2 03/23/2022   metoprolol tartrate (LOPRESSOR) 100 MG tablet TAKE 1 TABLET TWICE DAILY 180 tablet 0 03/23/2022   Multiple Vitamin (MULTIVITAMIN WITH MINERALS) TABS tablet Take 1 tablet by mouth in the morning.   03/23/2022   omeprazole (PRILOSEC) 20 MG capsule TAKE 1 CAPSULE TWICE DAILY BEFORE MEALS 180 capsule 1 03/23/2022   potassium chloride (MICRO-K) 10 MEQ CR capsule Take 10 mEq with torsemide 20 mg, take 20 Meq with torsemide 40 mg 90 capsule 3 03/22/2022   rosuvastatin (CRESTOR) 10 MG tablet Take 1 tablet (10 mg total) by mouth daily. 90 tablet 3 03/22/2022   sertraline (ZOLOFT) 50 MG tablet TAKE 1 TABLET DAILY 90 tablet 1 03/22/2022   spironolactone (ALDACTONE) 25 MG tablet TAKE 1  TABLET EVERY DAY 90 tablet 2 03/23/2022   torsemide (DEMADEX) 20 MG tablet Weight <205 no torsemide Weight 205-211 take torsemide 20 mg daily and potassium 10 meq Weight >212 take torsemide 40 mg daily with potassium 20 mEq 60 tablet 1 Past Week   acetaminophen (TYLENOL) 325 MG tablet Take 650 mg by mouth 4 (four) times daily as needed.      apixaban (ELIQUIS) 5 MG TABS tablet Take 1 tablet (5 mg total) by mouth 2 (two) times daily. 60 tablet 6 03/20/2022 at 1700     Allergies  Allergen Reactions   Codeine Other (See Comments)    Altered mental status- "Cannot function"     Past Medical History:  Diagnosis Date   Anemia    Chicken pox    Colon polyps    Complication of anesthesia    doesn't take much anesthesia to put pt to sleep, slow to wake up   DDD (degenerative disc disease), cervical 02/05/2013   C5-C6 and C6-C7   GERD (gastroesophageal reflux disease)    Heart murmur    History of atrial fibrillation    History of chicken pox    Hypercholesterolemia    Hyperlipidemia    Hypertension    Hypothyroidism    Migraines    PONV (postoperative nausea and vomiting)    Sleep apnea    Squamous cell skin cancer     Review of systems:  Otherwise negative.  Physical Exam  Gen: Alert, oriented. Appears stated age.  HEENT: [place]/AT. PERRLA. Lungs: CTA, no wheezes. CV: RR nl S1, S2. Abd: soft, benign, no masses. BS+ Ext: No edema. Pulses 2+    Planned procedures: Proceed with EGD and colonoscopy. The patient understands the nature of the planned procedure, indications, risks, alternatives and potential complications including but not limited to bleeding, infection, perforation, damage to internal organs and possible oversedation/side effects from anesthesia. The patient agrees and gives consent to proceed.  Please refer to procedure notes for findings, recommendations and patient disposition/instructions.     [facility] [place]. Alice Browning, M.D. Gastroenterology 03/23/2022  2:22  PM

## 2022-03-24 ENCOUNTER — Encounter: Payer: Self-pay | Admitting: Internal Medicine

## 2022-03-24 NOTE — Anesthesia Postprocedure Evaluation (Signed)
Anesthesia Post Note  Patient: Amanda Browning  Procedure(s) Performed: COLONOSCOPY ESOPHAGOGASTRODUODENOSCOPY (EGD)  Patient location during evaluation: PACU Anesthesia Type: General Level of consciousness: awake and alert Pain management: pain level controlled Vital Signs Assessment: post-procedure vital signs reviewed and stable Respiratory status: spontaneous breathing, nonlabored ventilation and respiratory function stable Cardiovascular status: blood pressure returned to baseline and stable Postop Assessment: no apparent nausea or vomiting Anesthetic complications: no   No notable events documented.   Last Vitals:  Vitals:   03/23/22 1529 03/23/22 1539  BP: 124/63 139/64  Pulse: (!) 54 (!) 53  Resp: 20 15  Temp:    SpO2: 94% 100%    Last Pain:  Vitals:   03/23/22 1539  TempSrc:   PainSc: 0-No pain                 [place]

## 2022-03-25 LAB — SURGICAL PATHOLOGY

## 2022-03-30 DIAGNOSIS — H1132 Conjunctival hemorrhage, left eye: Secondary | ICD-10-CM | POA: Diagnosis not present

## 2022-03-30 DIAGNOSIS — H43813 Vitreous degeneration, bilateral: Secondary | ICD-10-CM | POA: Diagnosis not present

## 2022-04-20 ENCOUNTER — Ambulatory Visit
Admission: RE | Admit: 2022-04-20 | Discharge: 2022-04-20 | Disposition: A | Discharge status: Home / Self Care | Payer: Medicare HMO | Source: Ambulatory Visit | Attending: Internal Medicine | Admitting: Internal Medicine

## 2022-04-20 DIAGNOSIS — Z1231 Encounter for screening mammogram for malignant neoplasm of breast: Secondary | ICD-10-CM | POA: Diagnosis not present

## 2022-04-21 ENCOUNTER — Other Ambulatory Visit: Payer: Self-pay | Admitting: Internal Medicine

## 2022-04-21 DIAGNOSIS — R928 Other abnormal and inconclusive findings on diagnostic imaging of breast: Secondary | ICD-10-CM

## 2022-04-21 NOTE — Progress Notes (Signed)
Order placed for f/u right breast mammogram and ultrasound  

## 2022-05-04 ENCOUNTER — Ambulatory Visit
Admission: RE | Admit: 2022-05-04 | Discharge: 2022-05-04 | Disposition: A | Discharge status: Home / Self Care | Payer: Medicare HMO | Source: Ambulatory Visit | Attending: Internal Medicine | Admitting: Internal Medicine

## 2022-05-04 DIAGNOSIS — R928 Other abnormal and inconclusive findings on diagnostic imaging of breast: Secondary | ICD-10-CM | POA: Insufficient documentation

## 2022-05-04 DIAGNOSIS — R921 Mammographic calcification found on diagnostic imaging of breast: Secondary | ICD-10-CM | POA: Diagnosis not present

## 2022-05-19 ENCOUNTER — Telehealth: Payer: Self-pay

## 2022-05-19 ENCOUNTER — Ambulatory Visit (INDEPENDENT_AMBULATORY_CARE_PROVIDER_SITE_OTHER): Payer: Medicare HMO | Admitting: Internal Medicine

## 2022-05-19 ENCOUNTER — Encounter: Payer: Self-pay | Admitting: Internal Medicine

## 2022-05-19 VITALS — BP 122/70 | HR 70 | Temp 97.9°F | Resp 16 | Ht 63.0 in | Wt 216.0 lb

## 2022-05-19 DIAGNOSIS — R739 Hyperglycemia, unspecified: Secondary | ICD-10-CM | POA: Diagnosis not present

## 2022-05-19 DIAGNOSIS — E039 Hypothyroidism, unspecified: Secondary | ICD-10-CM | POA: Diagnosis not present

## 2022-05-19 DIAGNOSIS — I4819 Other persistent atrial fibrillation: Secondary | ICD-10-CM | POA: Diagnosis not present

## 2022-05-19 DIAGNOSIS — K219 Gastro-esophageal reflux disease without esophagitis: Secondary | ICD-10-CM

## 2022-05-19 DIAGNOSIS — K449 Diaphragmatic hernia without obstruction or gangrene: Secondary | ICD-10-CM

## 2022-05-19 DIAGNOSIS — R350 Frequency of micturition: Secondary | ICD-10-CM

## 2022-05-19 DIAGNOSIS — D509 Iron deficiency anemia, unspecified: Secondary | ICD-10-CM

## 2022-05-19 DIAGNOSIS — I10 Essential (primary) hypertension: Secondary | ICD-10-CM | POA: Diagnosis not present

## 2022-05-19 DIAGNOSIS — D649 Anemia, unspecified: Secondary | ICD-10-CM

## 2022-05-19 DIAGNOSIS — E7800 Pure hypercholesterolemia, unspecified: Secondary | ICD-10-CM

## 2022-05-19 DIAGNOSIS — R0602 Shortness of breath: Secondary | ICD-10-CM

## 2022-05-19 DIAGNOSIS — K2100 Gastro-esophageal reflux disease with esophagitis, without bleeding: Secondary | ICD-10-CM

## 2022-05-19 DIAGNOSIS — G4733 Obstructive sleep apnea (adult) (pediatric): Secondary | ICD-10-CM

## 2022-05-19 LAB — BASIC METABOLIC PANEL
BUN: 23 mg/dL (ref 6–23)
CO2: 34 mEq/L — ABNORMAL HIGH (ref 19–32)
Calcium: 10 mg/dL (ref 8.4–10.5)
Chloride: 97 mEq/L (ref 96–112)
Creatinine, Ser: 1.36 mg/dL — ABNORMAL HIGH (ref 0.40–1.20)
GFR: 37.87 mL/min — ABNORMAL LOW (ref >60.00)
Glucose, Bld: 93 mg/dL (ref 70–99)
Potassium: 4.8 mEq/L (ref 3.5–5.1)
Sodium: 141 mEq/L (ref 135–145)

## 2022-05-19 LAB — LIPID PANEL
Cholesterol: 204 mg/dL — ABNORMAL HIGH (ref 0–200)
HDL: 71.3 mg/dL (ref >39.00)
LDL Cholesterol: 105 mg/dL — ABNORMAL HIGH (ref 0–99)
NonHDL: 133.03
Total CHOL/HDL Ratio: 3
Triglycerides: 139 mg/dL (ref 0.0–149.0)
VLDL: 27.8 mg/dL (ref 0.0–40.0)

## 2022-05-19 LAB — HEPATIC FUNCTION PANEL
ALT: 19 U/L (ref 0–35)
AST: 21 U/L (ref 0–37)
Albumin: 4.5 g/dL (ref 3.5–5.2)
Alkaline Phosphatase: 81 U/L (ref 39–117)
Bilirubin, Direct: 0.1 mg/dL (ref 0.0–0.3)
Total Bilirubin: 0.8 mg/dL (ref 0.2–1.2)
Total Protein: 6.9 g/dL (ref 6.0–8.3)

## 2022-05-19 LAB — HEMOGLOBIN A1C: Hgb A1c MFr Bld: 5.9 % (ref 4.6–6.5)

## 2022-05-19 LAB — TSH: TSH: 1.75 u[IU]/mL (ref 0.35–5.50)

## 2022-05-19 NOTE — Telephone Encounter (Signed)
Medication Samples have been provided to the patient.  Drug name: Eliquis        Strength: 5 mg         Qty: 2 boxes   LOT: [id number] / [id number]    Exp.Date: 08/2023 / 07/2023  Dosing instructions: Take 1 tablet by mouth twice daily.  The patient has been instructed regarding the correct time, dose, and frequency of taking this medication, including desired effects and most common side effects.   Amanda Browning 10:05 AM 05/19/2022

## 2022-05-19 NOTE — Progress Notes (Signed)
Subjective:    Patient ID: Amanda Browning, female    DOB: September 16, 1945, 77 y.o.   MRN: [id number]  Patient here for  Chief Complaint  Patient presents with   Medical Management of Chronic Issues    HPI Here to follow up regarding afib, GERD, hypertension, hypothyroidism, hypercholesterolemia and hyperglycemia.  States her swallowing is stable.  Eating.  Weight is up.  She feels this is more related to her appetite.  Discussed her fluid pills and if weighing.  She has not been taking her diuretic as regular.  Reports she has been having problems with urination and incontinence.  Does not take as often.  Has noticed some intermittent sob.  Did take two fluid pills yesterday.  No cough or congestion.  Does feel her heart is stable.  Increased urinary frequency and nocturia.     Past Medical History:  Diagnosis Date   Anemia    Chicken pox    Colon polyps    Complication of anesthesia    doesn't take much anesthesia to put pt to sleep, slow to wake up   DDD (degenerative disc disease), cervical 02/05/2013   C5-C6 and C6-C7   GERD (gastroesophageal reflux disease)    Heart murmur    History of atrial fibrillation    History of chicken pox    Hypercholesterolemia    Hyperlipidemia    Hypertension    Hypothyroidism    Migraines    PONV (postoperative nausea and vomiting)    Sleep apnea    Squamous cell skin cancer    Past Surgical History:  Procedure Laterality Date   BREAST BIOPSY Right 2012   stereotactic   CARDIOVERSION N/A 04/29/2021   Procedure: CARDIOVERSION;  Surgeon: [name], MD;  Location: [facility];  Service: Cardiovascular;  Laterality: N/A;   CATARACT EXTRACTION W/PHACO Left 09/06/2021   Procedure: CATARACT EXTRACTION PHACO AND INTRAOCULAR LENS PLACEMENT (IOC) LEFT 6.60 00:43.8;  Surgeon: Eulogio Bear, MD;  Location: [place];  Service: Ophthalmology;  Laterality: Left;   CATARACT EXTRACTION W/PHACO Right 10/04/2021   Procedure: CATARACT  EXTRACTION PHACO AND INTRAOCULAR LENS PLACEMENT (IOC) RIGHT 6.08 00:45.0;  Surgeon: Eulogio Bear, MD;  Location: [place];  Service: Ophthalmology;  Laterality: Right;   COLONOSCOPY N/A 03/23/2022   Procedure: COLONOSCOPY;  Surgeon: Toledo, Benay Pike, MD;  Location: [facility];  Service: Gastroenterology;  Laterality: N/A;   COLONOSCOPY WITH PROPOFOL N/A 02/23/2018   Procedure: COLONOSCOPY WITH PROPOFOL;  Surgeon: Manya Silvas, MD;  Location: [facility];  Service: Endoscopy;  Laterality: N/A;   ESOPHAGOGASTRODUODENOSCOPY N/A 03/15/2021   Procedure: ESOPHAGOGASTRODUODENOSCOPY (EGD);  Surgeon: Lin Landsman, MD;  Location: [facility] ENDOSCOPY;  Service: Gastroenterology;  Laterality: N/A;   ESOPHAGOGASTRODUODENOSCOPY N/A 03/23/2022   Procedure: ESOPHAGOGASTRODUODENOSCOPY (EGD);  Surgeon: Toledo, Benay Pike, MD;  Location: [facility]OS[facility];  Service: Gastroenterology;  Laterality: N/A;   KNEE ARTHROPLASTY Right 04/17/2017   Procedure: COMPUTER ASSISTED TOTAL KNEE ARTHROPLASTY;  Surgeon: Dereck Leep, MD;  Location: [facility];  Service: Orthopedics;  Laterality: Right;   SKIN BIOPSY     VAGINAL HYSTERECTOMY  1994   ovaries not removed   WISDOM TOOTH EXTRACTION     Family History  Problem Relation Age of Onset   Heart disease Mother    Stroke Mother    Hypertension Mother    Heart attack Mother    Heart disease Father        enlarged heart   Stroke Father  Hypertension Father    Hyperlipidemia Father    Heart attack Father    Ovarian cancer Paternal Aunt    Breast cancer Sister 29   Cancer Sister        Breast Cancer   Diabetes type II Brother    Hypertension Brother    Colon cancer Neg Hx    Basal cell carcinoma Neg Hx    Melanoma Neg Hx    Squamous cell carcinoma Neg Hx    Social History   Socioeconomic History   Marital status: Divorced    Spouse name: Not on file   Number of children: 2   Years of education: 12   Highest education level: High  school graduate  Occupational History   Not on file  Tobacco Use   Smoking status: Never   Smokeless tobacco: Never  Vaping Use   Vaping Use: Never used  Substance and Sexual Activity   Alcohol use: No    Alcohol/week: 0.0 standard drinks of alcohol   Drug use: No   Sexual activity: Never  Other Topics Concern   Not on file  Social History Narrative   Full Code   Alcohol - none   Never smoker and no smokeless tobacco   2 daughters   Social Determinants of Health   Financial Resource Strain: Low Risk  (07/28/2017)   Overall Financial Resource Strain (CARDIA)    Difficulty of Paying Living Expenses: Not hard at all  Food Insecurity: No Food Insecurity (07/28/2017)   Hunger Vital Sign    Worried About Running Out of Food in the Last Year: Never true    Ran Out of Food in the Last Year: Never true  Transportation Needs: No Transportation Needs (07/28/2017)   PRAPARE - Hydrologist (Medical): No    Lack of Transportation (Non-Medical): No  Physical Activity: Insufficiently Active (07/28/2017)   Exercise Vital Sign    Days of Exercise per Week: 4 days    Minutes of Exercise per Session: 30 min  Stress: No Stress Concern Present (07/28/2017)   [place]    Feeling of Stress : Not at all  Social Connections: Not on file     Review of Systems  Constitutional:  Negative for appetite change and unexpected weight change.  HENT:  Negative for congestion and sinus pressure.   Respiratory:  Negative for cough and chest tightness.        Some sob with exertion as outlined.   Cardiovascular:  Negative for chest pain and palpitations.       No increased swelling.   Gastrointestinal:  Negative for abdominal pain, diarrhea, nausea and vomiting.  Genitourinary:  Positive for frequency.       Nocturia.  Incontinence.   Musculoskeletal:  Negative for joint swelling and myalgias.  Skin:  Negative  for color change and rash.  Neurological:  Negative for dizziness and headaches.  Psychiatric/Behavioral:  Negative for agitation and dysphoric mood.        Objective:     BP 122/70   Pulse 70   Temp 97.9 F (36.6 C)   Resp 16   Ht '5\' 3"'$  (1.6 m)   Wt 216 lb (98 kg)   SpO2 98%   BMI 38.26 kg/m  Wt Readings from Last 3 Encounters:  05/19/22 216 lb (98 kg)  03/23/22 210 lb (95.3 kg)  01/17/22 210 lb 9.6 oz (95.5 kg)    Physical Exam  Vitals reviewed.  Constitutional:      General: She is not in acute distress.    Appearance: Normal appearance.  HENT:     Head: Normocephalic and atraumatic.     Right Ear: External ear normal.     Left Ear: External ear normal.  Eyes:     General: No scleral icterus.       Right eye: No discharge.        Left eye: No discharge.     Conjunctiva/sclera: Conjunctivae normal.  Neck:     Thyroid: No thyromegaly.  Cardiovascular:     Rate and Rhythm: Normal rate and regular rhythm.  Pulmonary:     Effort: No respiratory distress.     Breath sounds: Normal breath sounds. No wheezing.  Abdominal:     General: Bowel sounds are normal.     Palpations: Abdomen is soft.     Tenderness: There is no abdominal tenderness.  Musculoskeletal:        General: No tenderness.     Cervical back: Neck supple. No tenderness.     Comments: No increased swelling.   Lymphadenopathy:     Cervical: No cervical adenopathy.  Skin:    Findings: No erythema or rash.  Neurological:     Mental Status: She is alert.  Psychiatric:        Mood and Affect: Mood normal.        Behavior: Behavior normal.      Outpatient Encounter Medications as of 05/19/2022  Medication Sig   acetaminophen (TYLENOL) 325 MG tablet Take 650 mg by mouth 4 (four) times daily as needed.   apixaban (ELIQUIS) 5 MG TABS tablet Take 1 tablet (5 mg total) by mouth 2 (two) times daily.   cetirizine (ZYRTEC) 10 MG tablet Take 10 mg by mouth every evening.   diltiazem (CARDIZEM CD) 240 MG  24 hr capsule TAKE 1 CAPSULE (240 MG TOTAL) BY MOUTH DAILY.   ferrous sulfate 325 (65 FE) MG tablet Take 325 mg by mouth in the morning.   levothyroxine (SYNTHROID) 75 MCG tablet TAKE 1 TABLET EVERY DAY AT 6:00 AM   losartan (COZAAR) 25 MG tablet TAKE 1/2 TABLET EVERY MORNING   magnesium oxide (MAG-OX) 400 MG tablet Take 1 tablet (400 mg total) by mouth 2 (two) times daily.   metoprolol tartrate (LOPRESSOR) 100 MG tablet TAKE 1 TABLET TWICE DAILY   Multiple Vitamin (MULTIVITAMIN WITH MINERALS) TABS tablet Take 1 tablet by mouth in the morning.   omeprazole (PRILOSEC) 20 MG capsule TAKE 1 CAPSULE TWICE DAILY BEFORE MEALS   potassium chloride (MICRO-K) 10 MEQ CR capsule Take 10 mEq with torsemide 20 mg, take 20 Meq with torsemide 40 mg   sertraline (ZOLOFT) 50 MG tablet TAKE 1 TABLET DAILY   spironolactone (ALDACTONE) 25 MG tablet TAKE 1 TABLET EVERY DAY   torsemide (DEMADEX) 20 MG tablet Weight <205 no torsemide Weight 205-211 take torsemide 20 mg daily and potassium 10 meq Weight >212 take torsemide 40 mg daily with potassium 20 mEq   [DISCONTINUED] rosuvastatin (CRESTOR) 10 MG tablet Take 1 tablet (10 mg total) by mouth daily.   No facility-administered encounter medications on file as of 05/19/2022.     Lab Results  Component Value Date   WBC 11.4 (H) 03/30/2021   HGB 10.3 (L) 04/02/2021   HCT 32.4 (L) 04/02/2021   PLT 316.0 03/30/2021   GLUCOSE 93 05/19/2022   CHOL 204 (H) 05/19/2022   TRIG 139.0 05/19/2022   HDL  71.30 05/19/2022   LDLDIRECT 131.0 03/19/2013   LDLCALC 105 (H) 05/19/2022   ALT 19 05/19/2022   AST 21 05/19/2022   NA 141 05/19/2022   K 4.8 05/19/2022   CL 97 05/19/2022   CREATININE 1.36 (H) 05/19/2022   BUN 23 05/19/2022   CO2 34 (H) 05/19/2022   TSH 1.75 05/19/2022   INR 1.2 03/12/2021   HGBA1C 5.9 05/19/2022    MM DIAG BREAST TOMO UNI RIGHT  Result Date: 05/04/2022 CLINICAL DATA:  77 year old female presenting as a recall from screening for possible right  breast distortion and calcifications. EXAM: DIGITAL DIAGNOSTIC UNILATERAL RIGHT MAMMOGRAM WITH TOMOSYNTHESIS TECHNIQUE: Right digital diagnostic mammography and breast tomosynthesis was performed. COMPARISON:  Previous exam(s). ACR Breast Density Category b: There are scattered areas of fibroglandular density. FINDINGS: Spot compression tomosynthesis and spot 2D magnification views as well as full true lateral tomosynthesis views of the right breast were performed for a questioned area of distortion best seen on MLO view in the central breast as well as for calcifications in the lower inner breast. On the additional imaging the possible distortion resolves and is most consistent with normal overlapping tissue. The calcifications in the lower inner left breast have been stable dating back to at least 2021, consistent with a benign process. No suspicious linear or branching forms. IMPRESSION: 1. Resolution of the questioned distortion on screening mammogram in the right breast. 2. The calcifications in the lower inner right breast have been stable dating back to at least 2021 consistent with a benign process. RECOMMENDATION: Screening mammogram in one year.(Code:SM-B-01Y) I have discussed the findings and recommendations with the patient. If applicable, a reminder letter will be sent to the patient regarding the next appointment. BI-RADS CATEGORY  2: Benign. Electronically Signed   By: Audie Pinto M.D.   On: 05/04/2022 15:49      Assessment & Plan:  Hyperglycemia Assessment & Plan: Low carb diet and exercise.  Follow met b and a1c.   Lab Results  Component Value Date   HGBA1C 5.9 05/19/2022    Orders: -     Hemoglobin A1c  Hypercholesterolemia Assessment & Plan: On crestor.  Low cholesterol diet and exercise.  Follow lipid panel and liver function tests.   Orders: -     Lipid panel -     Hepatic function panel -     TSH  Primary hypertension Assessment & Plan: Currently on cardizem and  lopressor. Takes Torsemide as outlined.  Blood pressure controlled.  Check metabolic panel.   Orders: -     Basic metabolic panel  Persistent atrial fibrillation [facility]) Assessment & Plan: On eliquis.  Continues on diltiazem and metoprolol.    Orders: -     AMB Referral to Pharmacy Medication Management  Anemia, unspecified type Assessment & Plan: Follow cbc.    Gastroesophageal reflux disease, unspecified whether esophagitis present Assessment & Plan: Continue prilosec.  No upper symptoms.  Swallowing stable.    Hiatal hernia with GERD and esophagitis Assessment & Plan: Found to have large hiatal hernia and ulcer with no active bleeding.  Saw GI.  Recommended PPI bid. Swallowing stable.    Hypothyroidism, unspecified type Assessment & Plan: On synthroid.  Follow tsh.    Iron deficiency anemia, unspecified iron deficiency anemia type Assessment & Plan: S/p venofer.  Follow cbc.     OSA (obstructive sleep apnea) Assessment & Plan: Moderate to severe OSA.  Seeing pulmonary.  Recommended CPAP (autoset 5-15).     SOB (shortness  of breath) on exertion Assessment & Plan: Reports occasional sob with exertion. Reports has not been taking her torsemide as directed.  Discussed the need to weight herself.  Take torsemide if gains 3 pounds in 1 day or 5 pounds in one week.  Cardiology is following as well.  Avoid foods with increased sodium.  Took tow torsemide yesterday.  No increased sob today.  Having urinary issues. Discussed further evaluation with urology, especially given this is affecting her taking her torsemide.  Follow metabolic panel.    Urinary frequency Assessment & Plan: Increased urinary frequency - day and night.  Some incontinence. She feels she is not emptying her bladder fully.  Discussed further w/up and evaluation.  Will refer to urology.    Orders: -     Ambulatory referral to Urology     Einar Pheasant, MD

## 2022-05-20 ENCOUNTER — Telehealth: Payer: Self-pay

## 2022-05-20 ENCOUNTER — Other Ambulatory Visit: Payer: Self-pay

## 2022-05-20 DIAGNOSIS — N289 Disorder of kidney and ureter, unspecified: Secondary | ICD-10-CM

## 2022-05-20 MED ORDER — ROSUVASTATIN CALCIUM 20 MG PO TABS: 20.0000 mg | ORAL_TABLET | Freq: Every day | ORAL | 3 refills | Status: DC

## 2022-05-20 NOTE — Progress Notes (Signed)
   Care Guide Note  05/20/2022 Name: Amanda Browning MRN: [id number] DOB: 06-18-1945  Referred by: Einar Pheasant, MD Reason for referral : No chief complaint on file.   Amanda Browning is a 77 y.o. year old female who is a primary care patient of Einar Pheasant, MD. Amanda Browning was referred to the pharmacist for assistance related to HTN and DM.    Successful contact was made with the patient to discuss pharmacy services including being ready for the pharmacist to call at least 5 minutes before the scheduled appointment time, to have medication bottles and any blood sugar or blood pressure readings ready for review. The patient agreed to meet with the pharmacist via with the pharmacist via telephone visit on (date/time).  06/15/2022  [name], [place], [place] [address] Direct Dial: [phone] [email or web address]

## 2022-05-22 ENCOUNTER — Encounter: Payer: Self-pay | Admitting: Internal Medicine

## 2022-05-22 DIAGNOSIS — R350 Frequency of micturition: Secondary | ICD-10-CM | POA: Insufficient documentation

## 2022-05-22 NOTE — Assessment & Plan Note (Signed)
Follow cbc.  

## 2022-05-22 NOTE — Assessment & Plan Note (Signed)
S/p venofer.  Follow cbc.

## 2022-05-22 NOTE — Assessment & Plan Note (Signed)
Low carb diet and exercise.  Follow met b and a1c.   Lab Results  Component Value Date   HGBA1C 5.9 05/19/2022

## 2022-05-22 NOTE — Assessment & Plan Note (Signed)
Moderate to severe OSA.  Seeing pulmonary.  Recommended CPAP (autoset 5-15).

## 2022-05-22 NOTE — Assessment & Plan Note (Signed)
On synthroid.  Follow tsh.   

## 2022-05-22 NOTE — Assessment & Plan Note (Signed)
Continue prilosec.  No upper symptoms.  Swallowing stable.

## 2022-05-22 NOTE — Assessment & Plan Note (Signed)
Reports occasional sob with exertion. Reports has not been taking her torsemide as directed.  Discussed the need to weight herself.  Take torsemide if gains 3 pounds in 1 day or 5 pounds in one week.  Cardiology is following as well.  Avoid foods with increased sodium.  Took tow torsemide yesterday.  No increased sob today.  Having urinary issues. Discussed further evaluation with urology, especially given this is affecting her taking her torsemide.  Follow metabolic panel.

## 2022-05-22 NOTE — Assessment & Plan Note (Signed)
On crestor.  Low cholesterol diet and exercise.  Follow lipid panel and liver function tests.   

## 2022-05-22 NOTE — Assessment & Plan Note (Signed)
Currently on cardizem and lopressor. Takes Torsemide as outlined.  Blood pressure controlled.  Check metabolic panel.

## 2022-05-22 NOTE — Assessment & Plan Note (Signed)
Found to have large hiatal hernia and ulcer with no active bleeding.  Saw GI.  Recommended PPI bid. Swallowing stable.

## 2022-05-22 NOTE — Assessment & Plan Note (Signed)
Increased urinary frequency - day and night.  Some incontinence. She feels she is not emptying her bladder fully.  Discussed further w/up and evaluation.  Will refer to urology.

## 2022-05-22 NOTE — Assessment & Plan Note (Signed)
On eliquis.  Continues on diltiazem and metoprolol.

## 2022-05-30 ENCOUNTER — Encounter: Payer: Self-pay | Admitting: Internal Medicine

## 2022-05-30 ENCOUNTER — Encounter: Payer: Self-pay | Admitting: Urology

## 2022-05-30 ENCOUNTER — Ambulatory Visit: Payer: Medicare HMO | Admitting: Urology

## 2022-05-30 ENCOUNTER — Other Ambulatory Visit: Payer: Self-pay | Admitting: Internal Medicine

## 2022-05-30 VITALS — BP 136/82 | HR 59 | Ht 63.5 in | Wt 215.0 lb

## 2022-05-30 DIAGNOSIS — R350 Frequency of micturition: Secondary | ICD-10-CM | POA: Diagnosis not present

## 2022-05-30 DIAGNOSIS — N3281 Overactive bladder: Secondary | ICD-10-CM | POA: Diagnosis not present

## 2022-05-30 LAB — URINALYSIS, COMPLETE
Bilirubin, UA: NEGATIVE
Nitrite, UA: NEGATIVE
RBC, UA: NEGATIVE
Specific Gravity, UA: 1.025 (ref 1.005–1.030)
Urobilinogen, Ur: 1 mg/dL (ref 0.2–1.0)
pH, UA: 7 (ref 5.0–7.5)

## 2022-05-30 LAB — MICROSCOPIC EXAMINATION: Epithelial Cells (non renal): >10 /hpf — AB (ref 0–10)

## 2022-05-30 LAB — BLADDER SCAN AMB NON-IMAGING: Scan Result: 1

## 2022-05-30 NOTE — Patient Instructions (Signed)

## 2022-05-30 NOTE — Progress Notes (Signed)
05/30/22 12:26 PM   Amanda Browning 08/30/1945 [id number]  CC: Overactive bladder  HPI: I saw Amanda Browning today for overactive bladder symptoms of urgency, frequency, nocturia, and urge incontinence.  She denies any significant stress incontinence.  She has never tried any medications for this.  She drinks primarily water and diet Sprite during the day.  She voids about every 2 hours overnight.  She denies any dysuria or gross hematuria.  She has had the symptoms for about a year.  She takes torsemide 1 or 2 times a week that exacerbates her urinary frequency.  Urinalysis today contaminated with greater than 10 squamous cells, 6-10 WBC, 0-2 RBC, many bacteria, consistent with contamination.  PVR normal at 32m.  She had a normal CT abdomen and pelvis in November 2022.  PMH: Past Medical History:  Diagnosis Date   Anemia    Chicken pox    Colon polyps    Complication of anesthesia    doesn't take much anesthesia to put pt to sleep, slow to wake up   DDD (degenerative disc disease), cervical 02/05/2013   C5-C6 and C6-C7   GERD (gastroesophageal reflux disease)    Heart murmur    History of atrial fibrillation    History of chicken pox    Hypercholesterolemia    Hyperlipidemia    Hypertension    Hypothyroidism    Migraines    PONV (postoperative nausea and vomiting)    Sleep apnea    Squamous cell skin cancer     Surgical History: Past Surgical History:  Procedure Laterality Date   BREAST BIOPSY Right 2012   stereotactic   CARDIOVERSION N/A 04/29/2021   Procedure: CARDIOVERSION;  Surgeon: GMinna Merritts MD;  Location: [facility];  Service: Cardiovascular;  Laterality: N/A;   CATARACT EXTRACTION W/PHACO Left 09/06/2021   Procedure: CATARACT EXTRACTION PHACO AND INTRAOCULAR LENS PLACEMENT (IOC) LEFT 6.60 00:43.8;  Surgeon: KEulogio Bear MD;  Location: [place]  Service: Ophthalmology;  Laterality: Left;   CATARACT EXTRACTION W/PHACO Right 10/04/2021    Procedure: CATARACT EXTRACTION PHACO AND INTRAOCULAR LENS PLACEMENT (IOC) RIGHT 6.08 00:45.0;  Surgeon: KEulogio Bear MD;  [place]  Service: Ophthalmology;  Laterality: Right;   COLONOSCOPY N/A 03/23/2022   Procedure: COLONOSCOPY;  Surgeon: Toledo, TBenay Pike MD;  Location: [facility];  Service: Gastroenterology;  Laterality: N/A;   COLONOSCOPY WITH PROPOFOL N/A 02/23/2018   Procedure: COLONOSCOPY WITH PROPOFOL;  Surgeon: EManya Silvas MD;  [facility]  Service: Endoscopy;  Laterality: N/A;   ESOPHAGOGASTRODUODENOSCOPY N/A 03/15/2021   Procedure: ESOPHAGOGASTRODUODENOSCOPY (EGD);  Surgeon: VLin Landsman MD;  [facility]  Service: Gastroenterology;  Laterality: N/A;   ESOPHAGOGASTRODUODENOSCOPY N/A 03/23/2022   Procedure: ESOPHAGOGASTRODUODENOSCOPY (EGD);  Surgeon: Toledo, TBenay Pike MD;  Location: [facility];  Service: Gastroenterology;  Laterality: N/A;   KNEE ARTHROPLASTY Right 04/17/2017   Procedure: COMPUTER ASSISTED TOTAL KNEE ARTHROPLASTY;  Surgeon: HDereck Leep MD;  Location: [facility];  Service: Orthopedics;  Laterality: Right;   SKIN BIOPSY     VAGINAL HYSTERECTOMY  1994   ovaries not removed   WISDOM TOOTH EXTRACTION      Family History: Family History  Problem Relation Age of Onset   Heart disease Mother    Stroke Mother    Hypertension Mother    Heart attack Mother    Heart disease Father        enlarged heart   Stroke Father    Hypertension Father  Hyperlipidemia Father    Heart attack Father    Ovarian cancer Paternal Aunt    Breast cancer Sister 57   Cancer Sister        Breast Cancer   Diabetes type II Brother    Hypertension Brother    Colon cancer Neg Hx    Basal cell carcinoma Neg Hx    Melanoma Neg Hx    Squamous cell carcinoma Neg Hx     Social History:  reports that she has never smoked. She has never been exposed to tobacco smoke. She has never used smokeless tobacco. She  reports that she does not drink alcohol and does not use drugs.  Physical Exam: BP 136/82   Pulse (!) 59   Ht 5' 3.5" (1.613 m)   Wt 215 lb (97.5 kg)   BMI 37.49 kg/m    Constitutional:  Alert and oriented, No acute distress. Cardiovascular: No clubbing, cyanosis, or edema. Respiratory: Normal respiratory effort, no increased work of breathing. GI: Abdomen is soft, nontender, nondistended, no abdominal masses  Pertinent Imaging: I have personally viewed and interpreted the CT scan from November 2022 showing no urologic abnormalities.  Assessment & Plan:   77 year old female with overactive bladder symptoms of urgency, frequency, nocturia, and urge incontinence.  No stress incontinence.  We discussed that overactive bladder (OAB) is not a disease, but is a symptom complex that is generally not life-threatening.  Symptoms typically include urinary urgency, frequency, and urge incontinence.  There are numerous treatment options, however there are risks and benefits with both medical and surgical management.  First-line treatment is behavioral therapies including bladder training, pelvic floor muscle training, and fluid management.  Second line treatments include oral antimuscarinics(Ditropan er, Trospium) and beta-3 agonist (Mybetriq). There is typically a period of medication trial (4-8 weeks) to find the optimal therapy and dosing. If symptoms are bothersome despite the above management, third line options include intra-detrusor botox, peripheral tibial nerve stimulation (PTNS), and interstim (SNS). These are more invasive treatments with higher side effect profile, but may improve quality of life for patients with severe OAB symptoms.   Recommended a trial of Gemtesa, with her age would avoid anticholinergics, samples given RTC 6 weeks symptom check  [name], MD 05/30/2022  [place] [address], [place], [place] [address] [phone]

## 2022-06-03 ENCOUNTER — Other Ambulatory Visit (INDEPENDENT_AMBULATORY_CARE_PROVIDER_SITE_OTHER): Payer: Medicare HMO

## 2022-06-03 DIAGNOSIS — N289 Disorder of kidney and ureter, unspecified: Secondary | ICD-10-CM

## 2022-06-03 LAB — BASIC METABOLIC PANEL
BUN: 22 mg/dL (ref 6–23)
CO2: 31 mEq/L (ref 19–32)
Calcium: 9.5 mg/dL (ref 8.4–10.5)
Chloride: 103 mEq/L (ref 96–112)
Creatinine, Ser: 1.17 mg/dL (ref 0.40–1.20)
GFR: 45.35 mL/min — ABNORMAL LOW (ref >60.00)
Glucose, Bld: 91 mg/dL (ref 70–99)
Potassium: 4.5 mEq/L (ref 3.5–5.1)
Sodium: 141 mEq/L (ref 135–145)

## 2022-06-15 ENCOUNTER — Other Ambulatory Visit: Payer: Medicare HMO | Admitting: Pharmacist

## 2022-06-15 ENCOUNTER — Telehealth: Payer: Self-pay

## 2022-06-15 MED ORDER — DAPAGLIFLOZIN PROPANEDIOL 10 MG PO TABS: 10.0000 mg | ORAL_TABLET | Freq: Every day | ORAL | 11 refills | Status: DC

## 2022-06-15 NOTE — Progress Notes (Signed)
06/15/2022 Name: CHALEE Browning MRN: [id number] DOB: [birth year]  Chief Complaint  Patient presents with   Medication Management   Hypertension    Amanda Browning is a 77 y.o. year old female who presented for a telephone visit.   They were referred to the pharmacist by their PCP for assistance in managing medication access.   Subjective:  Care Team: Primary Care Provider: Einar Pheasant, MD ; Next Scheduled Visit: 09/19/22  Medication Access/Adherence  Current Pharmacy:  [place], [address] [place] [address] Phone: [phone] Fax: [phone]  [place], [place] [address] [place] [address] Phone: [phone] Fax: [phone]   Patient reports affordability concerns with their medications: Yes  Patient reports access/transportation concerns to their pharmacy: No  Patient reports adherence concerns with their medications:  No    Hyperlipidemia/ASCVD Risk Reduction  Current lipid lowering medications: rosuvastatin 20 mg mg daily   Heart Failure:  Current medications:  ACEi/ARB/ARNI: losartan 12.5 mg daily  SGLT2i: none Beta blocker: metoprolol tartrate 100 mg twice daily Mineralocorticoid Receptor Antagonist: spironolactone 25 mg daily Diuretic regimen: torsemide 20 mg + potassium 10 mEq as needed - taking about twice weekly  Atrial Fibrillation:  Current medications: Rate Control: diltiazem 240 mg daily, metoprolol tartrate 100 mg twice daily Anticoagulation Regimen: Eliquis 5 mg twice daily   Depression/Anxiety:  Current medications: sertraline - patient reports she takes 25 mg daily (1/2 tablet)  Overactive Bladder: Current medications: reports Logan Bores has been extremely beneficial on the days she does not have to take torsemide. Has been taking samples from Urology  Objective:  Lab Results  Component Value Date   HGBA1C  5.9 05/19/2022    Lab Results  Component Value Date   CREATININE 1.17 06/03/2022   BUN 22 06/03/2022   NA 141 06/03/2022   K 4.5 06/03/2022   CL 103 06/03/2022   CO2 31 06/03/2022    Lab Results  Component Value Date   CHOL 204 (H) 05/19/2022   HDL 71.30 05/19/2022   LDLCALC 105 (H) 05/19/2022   LDLDIRECT 131.0 03/19/2013   TRIG 139.0 05/19/2022   CHOLHDL 3 05/19/2022    Medications Reviewed Today     Reviewed by Osker Mason, RPH-CPP (Pharmacist) on 06/15/22 at 1311  Med List Status: <None>   Medication Order Taking? Sig Documenting Provider Last Dose Status Informant  acetaminophen (TYLENOL) 325 MG tablet [id number] Yes Take 650 mg by mouth 4 (four) times daily as needed. [provider] Taking Active Family Member  apixaban (ELIQUIS) 5 MG TABS tablet [id number] Yes Take 1 tablet (5 mg total) by mouth 2 (two) times daily. [name], MD Taking Active   cetirizine (ZYRTEC) 10 MG tablet [id number] Yes Take 10 mg by mouth every evening. [provider] Taking Active Family Member  diltiazem (CARDIZEM CD) 240 MG 24 hr capsule [id number] Yes TAKE 1 CAPSULE (240 MG TOTAL) BY MOUTH DAILY. Einar Pheasant, MD Taking Active   ferrous sulfate 325 (65 FE) MG tablet [id number] Yes Take 325 mg by mouth in the morning. [provider] Taking Active Family Member  levothyroxine (SYNTHROID) 75 MCG tablet [id number] Yes TAKE 1 TABLET EVERY DAY AT 6:00 AM Einar Pheasant, MD Taking Active   losartan (COZAAR) 25 MG tablet [id number] Yes TAKE 1/2 TABLET EVERY MORNING Einar Pheasant, MD Taking Active   magnesium oxide (MAG-OX) 400 MG tablet [id number] Yes Take  1 tablet (400 mg total) by mouth 2 (two) times daily. Einar Pheasant, MD Taking Active Family Member  metoprolol tartrate (LOPRESSOR) 100 MG tablet [id number] Yes TAKE 1 TABLET TWICE DAILY Einar Pheasant, MD Taking Active   Multiple Vitamin (MULTIVITAMIN WITH MINERALS) TABS tablet [id number] Yes Take 1 tablet  by mouth in the morning. [provider] Taking Active Family Member  omeprazole (PRILOSEC) 20 MG capsule [id number] Yes TAKE 1 CAPSULE TWICE DAILY BEFORE MEALS Einar Pheasant, MD Taking Active   potassium chloride (MICRO-K) 10 MEQ CR capsule [id number] Yes Take 10 mEq with torsemide 20 mg, take 20 Meq with torsemide 40 mg [name], MD Taking Active            Med Note [name], [name]   Wed Jun 15, 2022  1:09 PM) 10 mEq if she takes torsemide  rosuvastatin (CRESTOR) 20 MG tablet [id number] Yes Take 1 tablet (20 mg total) by mouth daily. Einar Pheasant, MD Taking Active   sertraline (ZOLOFT) 50 MG tablet [id number] Yes TAKE 1 TABLET DAILY Einar Pheasant, MD Taking Active            Med Note [name] Jun 15, 2022  1:09 PM) Taking 25 mg daily  spironolactone (ALDACTONE) 25 MG tablet [id number] Yes TAKE 1 TABLET EVERY DAY Einar Pheasant, MD Taking Active   torsemide (DEMADEX) 20 MG tablet [id number] Yes Weight <205 no torsemide Weight 205-211 take torsemide 20 mg daily and potassium 10 meq Weight >212 take torsemide 40 mg daily with potassium 20 mEq [name], MD Taking Active   Vibegron [facility]) 75 MG TABS [id number] Yes Take 75 mg by mouth daily. [provider] Taking Active               Assessment/Plan:   Reviewed income. Patient may   Hyperlipidemia/ASCVD Risk Reduction: - Currently controlled given age - Recommend to continue current regimen at this time   Heart Failure: - Currently appropriately managed but opportunity for optimization - Recommend to start SGLT2 for CKD and HF.  - Meets financial criteria for [place] patient assistance program through [employer]. Will collaborate with provider, CPhT, and patient to pursue assistance in case patient's application for Medicare [place] is denied   Atrial Fibrillation: - Currently controlled - Recommend to continue current regimen  - Meets financial criteria  for Eliquis patient assistance program through [employer]. Will collaborate with provider, CPhT, and patient to pursue assistance in case patient's application for Medicare [place] is denied  Depression/Anxiety: - Currently controlled - Recommend to continue current regime  Overactive Bladder: - Currently controlled on Gemtesa, though no patient assistance available. Patient will call her insurance to determine current copay and discuss with Urology at follow up, however, hope for approved [place] application to reduce copay   Follow Up Plan: pending LIS application  Catie Hedwig Morton, PharmD, [place], [facility] [phone]

## 2022-06-15 NOTE — Progress Notes (Signed)
Pt daughter advised samples ready for pick up

## 2022-06-15 NOTE — Patient Instructions (Addendum)
Amanda Browning,   We applied for Medicare Low Income Subsidy ("Extra Help") today. A letter from [employer] should be mailed to you in ~4-6 weeks with the determination.   If you are approved, your medication copays will be no more than $4.50/90 day supply for generics and ~$11.20/90 day supply for brand name medications.   If denied, we will need to submit that proof of denial with the application for patient assistance for Eliquis and Farxiga.   [name] is a medication that protects your heart and your kidneys- it reduces your risk of a heart failure exacerbation AND reduces your risk of worsening kidney function. We can apply for assistance from the manufacturer in case you are denied for Medicare [name].   Please reach out with any questions.   Thanks!  Catie Hedwig Morton, PharmD, [place], [facility] [phone]

## 2022-06-15 NOTE — Telephone Encounter (Signed)
Osker Mason, RPH-CPP  Zacarias Pontes, CMA That would be wonderful! Can you ask Carolee Rota to reach out to the [employer] sample person to see if y'all can get more?  Catie       Previous Messages    ----- Message ----- From: Zacarias Pontes, CMA Sent: 06/15/2022   1:51 PM EST To: Osker Mason, RPH-CPP   We have 2 boxes of '10mg'$  left. Do you want me to put aside for pt daughter to pick up ?  ----- Message ----- From: Osker Mason, RPH-CPP Sent: 06/15/2022   1:44 PM EST To: Zacarias Pontes, CMA  Do we have any Farxiga 10 mg samples?  Catie        Medication Samples have been provided to the patient.  Drug name: [name]       Strength: '10mg'$         Qty: 2  LOT: [id number]   Exp.Date: 12/16/2024  Dosing instructions: 1 tablet daily  The patient has been instructed regarding the correct time, dose, and frequency of taking this medication, including desired effects and most common side effects.   Zacarias Pontes 3:04 PM 06/15/2022

## 2022-06-16 ENCOUNTER — Telehealth: Payer: Self-pay | Admitting: Internal Medicine

## 2022-06-16 NOTE — Telephone Encounter (Signed)
Patient called the office to see if office had any apixaban (ELIQUIS) 5 MG TABS tablet samples.

## 2022-06-16 NOTE — Telephone Encounter (Signed)
Amanda Browning is working on getting her Patient Assistance for eliquis. Ok to give samples?

## 2022-06-16 NOTE — Telephone Encounter (Signed)
Ok

## 2022-06-16 NOTE — Telephone Encounter (Signed)
Will pull samples and place up front for pick up. Daughter says she will pick up tomorrow.

## 2022-06-17 NOTE — Telephone Encounter (Signed)
Samples of Eliquis 5 mg pulled for patient. Labeled and placed up front with her Farxiga samples for her daughter [name] to pick up.  Medication Samples have been provided to the patient.  Drug name: Eliquis        Strength: 5 mg         Qty: 2 boxes (14 tablets per box)   LOT: [id number]   Exp.Date: 06/2023 Dosing instructions: 1 tablet by mouth twice daily  The patient has been instructed regarding the correct time, dose, and frequency of taking this medication, including desired effects and most common side effects.   Harriet Pho 8:13 AM 06/17/2022

## 2022-06-20 ENCOUNTER — Other Ambulatory Visit: Payer: Self-pay | Admitting: Internal Medicine

## 2022-06-21 ENCOUNTER — Telehealth: Payer: Self-pay

## 2022-06-21 NOTE — Progress Notes (Signed)
Yes I will mail pt portion

## 2022-06-21 NOTE — Telephone Encounter (Signed)
Mailed PAP application for FARXIGA(AZ&ME) AND ELIQUIS (BMS) to patient home and I included she will need OOP expense for ELIQUIS(BMS) . I will fax PCP pages when I receive pt pages.   Sandre Kitty Rx Patient Advocate

## 2022-06-21 NOTE — Telephone Encounter (Signed)
error 

## 2022-07-01 ENCOUNTER — Telehealth: Payer: Self-pay | Admitting: Internal Medicine

## 2022-07-01 MED ORDER — DAPAGLIFLOZIN PROPANEDIOL 10 MG PO TABS: 10.0000 mg | ORAL_TABLET | Freq: Every day | ORAL | 0 refills | Status: DC

## 2022-07-01 NOTE — Addendum Note (Signed)
Addended by: Nanci Pina on: 07/01/2022 10:12 AM   Modules accepted: Orders

## 2022-07-01 NOTE — Telephone Encounter (Signed)
Patient will run out of her FARXIGA on Tuesday on 07/05/2022. Is it possible to prescribe a few pills until she receives her FARXIGA in the mail?  Pharmacy is [facility], [place].

## 2022-07-01 NOTE — Telephone Encounter (Signed)
Sent 30 day supply to pharmacy local as requested.Notified patient DPR.

## 2022-07-11 NOTE — Progress Notes (Unsigned)
07/12/2022 10:54 AM   Amanda Browning 16-Jan-1946 [id number]  Referring provider: Einar Pheasant, [id number] [place],  [place] [address]  Urological history: 1. Urge incontinence/OAB -Contributing factors of age, vaginal atrophy, diuretics, sleep apnea, hysterectomy, hypertension, congestive heart failure, obesity, anxiety and pelvic floor prolapse -Gemtesa 75 mg daily   Chief Complaint  Patient presents with   Over Active Bladder    HPI: Amanda Browning is a 77 y.o. female who presents today for 6-week follow-up after trial of Gemtesa 75 mg daily.  She is having 1-7 daytime voids, nocturia 1-2 and mild urge to urinate.  She does have urge incontinence.  She leaks 2 times daily.  She wears absorbent pads daily.  She does not engage in toilet mapping or fluid restriction.  She states the Gemtesa samples have made a big difference.  She is reduced her nocturia from twice a night to once a night, she has more time to make it to the restroom and she has had an over 50% reduction in her incontinent episodes.  Patient denies any modifying or aggravating factors.  Patient denies any gross hematuria, dysuria or suprapubic/flank pain.  Patient denies any fevers, chills, nausea or vomiting.    PVR 73 mL    PMH: Past Medical History:  Diagnosis Date   Anemia    Chicken pox    Colon polyps    Complication of anesthesia    doesn't take much anesthesia to put pt to sleep, slow to wake up   DDD (degenerative disc disease), cervical 02/05/2013   C5-C6 and C6-C7   GERD (gastroesophageal reflux disease)    Heart murmur    History of atrial fibrillation    History of chicken pox    Hypercholesterolemia    Hyperlipidemia    Hypertension    Hypothyroidism    Migraines    PONV (postoperative nausea and vomiting)    Sleep apnea    Squamous cell skin cancer     Surgical History: Past Surgical History:  Procedure Laterality Date   BREAST BIOPSY  Right 2012   stereotactic   CARDIOVERSION N/A 04/29/2021   Procedure: CARDIOVERSION;  Surgeon: [name], MD;  Location: [facility];  Service: Cardiovascular;  Laterality: N/A;   CATARACT EXTRACTION W/PHACO Left 09/06/2021   Procedure: CATARACT EXTRACTION PHACO AND INTRAOCULAR LENS PLACEMENT (IOC) LEFT 6.60 00:43.8;  Surgeon: Eulogio Bear, MD;  Location: [place];  Service: Ophthalmology;  Laterality: Left;   CATARACT EXTRACTION W/PHACO Right 10/04/2021   Procedure: CATARACT EXTRACTION PHACO AND INTRAOCULAR LENS PLACEMENT (IOC) RIGHT 6.08 00:45.0;  Surgeon: Eulogio Bear, MD;  Location: [place];  Service: Ophthalmology;  Laterality: Right;   COLONOSCOPY N/A 03/23/2022   Procedure: COLONOSCOPY;  Surgeon: Toledo, Benay Pike, MD;  Location: [facility] ENDOSCOPY;  Service: Gastroenterology;  Laterality: N/A;   COLONOSCOPY WITH PROPOFOL N/A 02/23/2018   Procedure: COLONOSCOPY WITH PROPOFOL;  Surgeon: Manya Silvas, MD;  [facility] END[facility];  Service: Endoscopy;  Laterality: N/A;   ESOPHAGOGASTRODUODENOSCOPY N/A 03/15/2021   Procedure: ESOPHAGOGASTRODUODENOSCOPY (EGD);  Surgeon: Lin Landsman, MD;  Location: [facility] ENDOSCOPY;  Service: Gastroenterology;  Laterality: N/A;   ESOPHAGOGASTRODUODENOSCOPY N/A 03/23/2022   Procedure: ESOPHAGOGASTRODUODENOSCOPY (EGD);  Surgeon: Toledo, Benay Pike, MD;  Location: [facility] ENDOSCOPY;  Service: Gastroenterology;  Laterality: N/A;   KNEE ARTHROPLASTY Right 04/17/2017   Procedure: COMPUTER ASSISTED TOTAL KNEE ARTHROPLASTY;  Surgeon: Dereck Leep, MD;  Location: [facility];  Service: Orthopedics;  Laterality:  Right;   SKIN BIOPSY     VAGINAL HYSTERECTOMY  1994   ovaries not removed   WISDOM TOOTH EXTRACTION      Home Medications:  Allergies as of 07/12/2022       Reactions   Codeine Other (See Comments)   Altered mental status- "Cannot function"        Medication List        Accurate as of July 12, 2022 10:54 AM.  If you have any questions, ask your nurse or doctor.          acetaminophen 325 MG tablet Commonly known as: TYLENOL Take 650 mg by mouth 4 (four) times daily as needed.   apixaban 5 MG Tabs tablet Commonly known as: ELIQUIS Take 1 tablet (5 mg total) by mouth 2 (two) times daily.   cetirizine 10 MG tablet Commonly known as: ZYRTEC Take 10 mg by mouth every evening.   dapagliflozin propanediol 10 MG Tabs tablet Commonly known as: Farxiga Take 1 tablet (10 mg total) by mouth daily before breakfast.   diltiazem 240 MG 24 hr capsule Commonly known as: CARDIZEM CD TAKE 1 CAPSULE (240 MG TOTAL) BY MOUTH DAILY.   ferrous sulfate 325 (65 FE) MG tablet Take 325 mg by mouth in the morning.   Gemtesa 75 MG Tabs Generic drug: Vibegron Take 75 mg by mouth daily. What changed: Another medication with the same name was added. Make sure you understand how and when to take each. Changed by: Zara Council, PA-C   Gemtesa 75 MG Tabs Generic drug: Vibegron Take 1 tablet (75 mg total) by mouth daily. What changed: You were already taking a medication with the same name, and this prescription was added. Make sure you understand how and when to take each. Changed by: Zara Council, PA-C   levothyroxine 75 MCG tablet Commonly known as: SYNTHROID TAKE 1 TABLET EVERY DAY AT 6AM   losartan 25 MG tablet Commonly known as: COZAAR TAKE 1/2 TABLET EVERY MORNING   magnesium oxide 400 MG tablet Commonly known as: MAG-OX Take 1 tablet (400 mg total) by mouth 2 (two) times daily.   metoprolol tartrate 100 MG tablet Commonly known as: LOPRESSOR TAKE 1 TABLET TWICE DAILY   multivitamin with minerals Tabs tablet Take 1 tablet by mouth in the morning.   omeprazole 20 MG capsule Commonly known as: PRILOSEC TAKE 1 CAPSULE TWICE DAILY BEFORE MEALS   potassium chloride 10 MEQ CR capsule Commonly known as: MICRO-K Take 10 mEq with torsemide 20 mg, take 20 Meq with torsemide 40 mg    rosuvastatin 20 MG tablet Commonly known as: Crestor Take 1 tablet (20 mg total) by mouth daily.   sertraline 50 MG tablet Commonly known as: ZOLOFT TAKE 1 TABLET DAILY   spironolactone 25 MG tablet Commonly known as: ALDACTONE TAKE 1 TABLET EVERY DAY   torsemide 20 MG tablet Commonly known as: DEMADEX Weight <205 no torsemide Weight 205-211 take torsemide 20 mg daily and potassium 10 meq Weight >212 take torsemide 40 mg daily with potassium 20 mEq        Allergies:  Allergies  Allergen Reactions   Codeine Other (See Comments)    Altered mental status- "Cannot function"    Family History: Family History  Problem Relation Age of Onset   Heart disease Mother    Stroke Mother    Hypertension Mother    Heart attack Mother    Heart disease Father        enlarged heart  Stroke Father    Hypertension Father    Hyperlipidemia Father    Heart attack Father    Ovarian cancer Paternal Aunt    Breast cancer Sister 13   Cancer Sister        Breast Cancer   Diabetes type II Brother    Hypertension Brother    Colon cancer Neg Hx    Basal cell carcinoma Neg Hx    Melanoma Neg Hx    Squamous cell carcinoma Neg Hx     Social History:  reports that she has never smoked. She has never been exposed to tobacco smoke. She has never used smokeless tobacco. She reports that she does not drink alcohol and does not use drugs.  ROS: Pertinent ROS in HPI  Physical Exam: BP 130/80   Ht 5\' 3"  (1.6 m)   Wt 220 lb 4.8 oz (99.9 kg)   BMI 39.02 kg/m   Constitutional:  Well nourished. Alert and oriented, No acute distress. HEENT: [place] AT, moist mucus membranes.  Trachea midline Cardiovascular: No clubbing, cyanosis, or edema. Respiratory: Normal respiratory effort, no increased work of breathing. Neurologic: Grossly intact, no focal deficits, moving all 4 extremities. Psychiatric: Normal mood and affect.    Laboratory Data: Lab Results  Component Value Date   CREATININE 1.17  06/03/2022    Lab Results  Component Value Date   HGBA1C 5.9 05/19/2022    Lab Results  Component Value Date   TSH 1.75 05/19/2022       Component Value Date/Time   CHOL 204 (H) 05/19/2022 1003   HDL 71.30 05/19/2022 1003   CHOLHDL 3 05/19/2022 1003   VLDL 27.8 05/19/2022 1003   LDLCALC 105 (H) 05/19/2022 1003   [place] 95 01/20/2017 1616    Lab Results  Component Value Date   AST 21 05/19/2022   Lab Results  Component Value Date   ALT 19 05/19/2022    Urinalysis Component     Latest Ref Rng 05/30/2022  Specific Gravity, UA     1.005 - 1.030  1.025   pH, UA     5.0 - 7.5  7.0   Color, UA     Yellow  Yellow   Appearance Ur     Clear  Hazy !   Leukocytes,UA     Negative  Trace !   Protein,UA     Negative/Trace  1+ !   Glucose, UA     Negative  Trace !   Ketones, UA     Negative  1+ !   RBC, UA     Negative  Negative   Bilirubin, UA     Negative  Negative   Urobilinogen, Ur     0.2 - 1.0 mg/dL 1.0   Nitrite, UA     Negative  Negative   Microscopic Examination See below:     Legend: ! Abnormal  Component     Latest Ref Rng 05/30/2022  WBC, UA     0 - 5 /hpf 6-10 !   RBC, Urine     0 - 2 /hpf 0-2   Epithelial Cells (non renal)     0 - 10 /hpf >10 !   Casts     None seen /lpf Present !   Cast Type     N/A  Granular casts !   Bacteria, UA     None seen/Few  Many !     Legend: ! Abnormal I have reviewed the labs.   Pertinent Imaging:  07/12/22 10:35  Scan Result 41ml    Assessment & Plan:    1. Urge incontinence/OAB -At goal with [facility] -Prescription sent to pharmacy -I will give samples to bridge the gap until she can get her prescription filled    Return in about 3 months (around 10/12/2022) for PVR and OAB questionnaire.  These notes generated with voice recognition software. I apologize for typographical errors.  [place], [place] [address]  [place], [place]  [address] [phone]

## 2022-07-12 ENCOUNTER — Encounter: Payer: Self-pay | Admitting: Urology

## 2022-07-12 ENCOUNTER — Ambulatory Visit: Payer: Medicare HMO | Admitting: Urology

## 2022-07-12 VITALS — BP 130/80 | Ht 63.0 in | Wt 220.3 lb

## 2022-07-12 DIAGNOSIS — N3941 Urge incontinence: Secondary | ICD-10-CM | POA: Diagnosis not present

## 2022-07-12 DIAGNOSIS — N3281 Overactive bladder: Secondary | ICD-10-CM

## 2022-07-12 LAB — BLADDER SCAN AMB NON-IMAGING

## 2022-07-12 MED ORDER — GEMTESA 75 MG PO TABS: 75.0000 mg | ORAL_TABLET | Freq: Every day | ORAL | 3 refills | Status: DC

## 2022-07-12 NOTE — Telephone Encounter (Signed)
I have received pt pages of PAP FOR ELIQUIS(BMS) AND FARXIGA(AZ&ME). I am faxing provider pages to [facility] office [phone]. Please review, sign and fax to [phone] ATTENTION Argenis Kumari L.   Sandre Kitty Rx Patient Advocate [phone]

## 2022-07-14 ENCOUNTER — Telehealth: Payer: Self-pay | Admitting: Urology

## 2022-07-14 NOTE — Telephone Encounter (Signed)
Patient's daughter notified to pick up samples.

## 2022-07-14 NOTE — Telephone Encounter (Signed)
Please let Mrs. Swarr that I left Gemtesa samples for her up front.

## 2022-07-15 ENCOUNTER — Other Ambulatory Visit: Payer: Medicare HMO | Admitting: Pharmacist

## 2022-07-15 MED ORDER — DAPAGLIFLOZIN PROPANEDIOL 10 MG PO TABS: 10.0000 mg | ORAL_TABLET | Freq: Every day | ORAL | 1 refills | Status: DC

## 2022-07-15 NOTE — Progress Notes (Signed)
Care Coordination Call  Received call from patient's daughter, [name]. She notes that [place] has been approved for Medicare Extra Help, as brand copays are now $11.20. This copay will be the same for 30 or 90 day supplies, so it will always be in the patient's best fiscal interest to fill for 90 day supplies.  Explained program to [place]. Patient will likely be denied for Eliquis and Farxiga assistance given Extra Help approval. However, Extra Help will apply to all medications, including Gemtesa, so this will be a more helpful option in the long run.   Will notify Dr. Rockey Situ to send Eliquis to [facility] for a 90 day supply and Zara Council to send [facility] for a 90 day supply.   Catie Hedwig Morton, PharmD, [place], [facility] [phone]

## 2022-07-17 ENCOUNTER — Other Ambulatory Visit: Payer: Self-pay | Admitting: Urology

## 2022-07-17 DIAGNOSIS — N3941 Urge incontinence: Secondary | ICD-10-CM

## 2022-07-17 DIAGNOSIS — N3281 Overactive bladder: Secondary | ICD-10-CM

## 2022-07-17 MED ORDER — GEMTESA 75 MG PO TABS: 75.0000 mg | ORAL_TABLET | Freq: Every day | ORAL | 3 refills | Status: DC

## 2022-07-18 ENCOUNTER — Telehealth: Payer: Self-pay | Admitting: *Deleted

## 2022-07-18 ENCOUNTER — Other Ambulatory Visit: Payer: Self-pay | Admitting: Internal Medicine

## 2022-07-18 MED ORDER — APIXABAN 5 MG PO TABS: 5.0000 mg | ORAL_TABLET | Freq: Two times a day (BID) | ORAL | 1 refills | Status: DC

## 2022-07-18 NOTE — Telephone Encounter (Signed)
-----   Message from [name], MD sent at 07/15/2022  6:12 PM EDT -----  ----- Message ----- From: Osker Mason, RPH-CPP Sent: 07/15/2022  11:38 AM EDT To: [name], MD  Hi Dr. Rockey Situ,   This patient was approved for Medicare Extra Help; brand copays will now be $11.20 whether a 30 or 90 day supply. Could you please send her Eliquis prescription to [facility] for a 90 day supply?  Thanks!  Catie Hedwig Morton, PharmD, [place], [facility] [phone]

## 2022-07-18 NOTE — Telephone Encounter (Signed)
Refill has been sent.  °

## 2022-07-27 NOTE — Telephone Encounter (Signed)
Patient has been approve for extra help so I have discard pap for  application PAP FOR ELIQUIS(BMS) AND FARXIGA(AZ&ME)   Melanee Spry CPhT Rx Patient Advocate [phone]   Please be advised

## 2022-08-15 ENCOUNTER — Other Ambulatory Visit: Payer: Self-pay | Admitting: Internal Medicine

## 2022-09-19 ENCOUNTER — Encounter: Payer: Self-pay | Admitting: Internal Medicine

## 2022-09-19 ENCOUNTER — Ambulatory Visit (INDEPENDENT_AMBULATORY_CARE_PROVIDER_SITE_OTHER): Payer: Medicare HMO | Admitting: Internal Medicine

## 2022-09-19 VITALS — BP 132/76 | HR 67 | Temp 98.0°F | Resp 16 | Ht 63.0 in | Wt 219.0 lb

## 2022-09-19 DIAGNOSIS — K219 Gastro-esophageal reflux disease without esophagitis: Secondary | ICD-10-CM | POA: Diagnosis not present

## 2022-09-19 DIAGNOSIS — I10 Essential (primary) hypertension: Secondary | ICD-10-CM | POA: Diagnosis not present

## 2022-09-19 DIAGNOSIS — G4733 Obstructive sleep apnea (adult) (pediatric): Secondary | ICD-10-CM

## 2022-09-19 DIAGNOSIS — I4819 Other persistent atrial fibrillation: Secondary | ICD-10-CM | POA: Diagnosis not present

## 2022-09-19 DIAGNOSIS — R739 Hyperglycemia, unspecified: Secondary | ICD-10-CM | POA: Diagnosis not present

## 2022-09-19 DIAGNOSIS — D649 Anemia, unspecified: Secondary | ICD-10-CM

## 2022-09-19 DIAGNOSIS — E7800 Pure hypercholesterolemia, unspecified: Secondary | ICD-10-CM | POA: Diagnosis not present

## 2022-09-19 DIAGNOSIS — K2100 Gastro-esophageal reflux disease with esophagitis, without bleeding: Secondary | ICD-10-CM

## 2022-09-19 DIAGNOSIS — K449 Diaphragmatic hernia without obstruction or gangrene: Secondary | ICD-10-CM

## 2022-09-19 DIAGNOSIS — E039 Hypothyroidism, unspecified: Secondary | ICD-10-CM | POA: Diagnosis not present

## 2022-09-19 DIAGNOSIS — N3281 Overactive bladder: Secondary | ICD-10-CM

## 2022-09-19 DIAGNOSIS — F439 Reaction to severe stress, unspecified: Secondary | ICD-10-CM

## 2022-09-19 LAB — CBC WITH DIFFERENTIAL/PLATELET
Basophils Absolute: 0.1 10*3/uL (ref 0.0–0.1)
Basophils Relative: 0.9 % (ref 0.0–3.0)
Eosinophils Absolute: 0.3 10*3/uL (ref 0.0–0.7)
Eosinophils Relative: 4.6 % (ref 0.0–5.0)
HCT: 40.8 % (ref 36.0–46.0)
Hemoglobin: 13.1 g/dL (ref 12.0–15.0)
Lymphocytes Relative: 30.5 % (ref 12.0–46.0)
Lymphs Abs: 1.7 10*3/uL (ref 0.7–4.0)
MCHC: 32 g/dL (ref 30.0–36.0)
MCV: 102.3 fl — ABNORMAL HIGH (ref 78.0–100.0)
Monocytes Absolute: 0.6 10*3/uL (ref 0.1–1.0)
Monocytes Relative: 10.7 % (ref 3.0–12.0)
Neutro Abs: 3 10*3/uL (ref 1.4–7.7)
Neutrophils Relative %: 53.3 % (ref 43.0–77.0)
Platelets: 214 10*3/uL (ref 150.0–400.0)
RBC: 3.99 Mil/uL (ref 3.87–5.11)
RDW: 12.3 % (ref 11.5–15.5)
WBC: 5.5 10*3/uL (ref 4.0–10.5)

## 2022-09-19 LAB — LIPID PANEL
Cholesterol: 161 mg/dL (ref 0–200)
HDL: 68.1 mg/dL (ref >39.00)
LDL Cholesterol: 75 mg/dL (ref 0–99)
NonHDL: 92.61
Total CHOL/HDL Ratio: 2
Triglycerides: 87 mg/dL (ref 0.0–149.0)
VLDL: 17.4 mg/dL (ref 0.0–40.0)

## 2022-09-19 LAB — BASIC METABOLIC PANEL
BUN: 18 mg/dL (ref 6–23)
CO2: 25 mEq/L (ref 19–32)
Calcium: 9.4 mg/dL (ref 8.4–10.5)
Chloride: 104 mEq/L (ref 96–112)
Creatinine, Ser: 1.21 mg/dL — ABNORMAL HIGH (ref 0.40–1.20)
GFR: 43.47 mL/min — ABNORMAL LOW (ref >60.00)
Glucose, Bld: 98 mg/dL (ref 70–99)
Potassium: 4.6 mEq/L (ref 3.5–5.1)
Sodium: 139 mEq/L (ref 135–145)

## 2022-09-19 LAB — HEPATIC FUNCTION PANEL
ALT: 17 U/L (ref 0–35)
AST: 20 U/L (ref 0–37)
Albumin: 4.2 g/dL (ref 3.5–5.2)
Alkaline Phosphatase: 60 U/L (ref 39–117)
Bilirubin, Direct: 0.1 mg/dL (ref 0.0–0.3)
Total Bilirubin: 0.5 mg/dL (ref 0.2–1.2)
Total Protein: 6.8 g/dL (ref 6.0–8.3)

## 2022-09-19 LAB — HEMOGLOBIN A1C: Hgb A1c MFr Bld: 5.4 % (ref 4.6–6.5)

## 2022-09-19 MED ORDER — SPIRONOLACTONE 25 MG PO TABS: 25.0000 mg | ORAL_TABLET | Freq: Every day | ORAL | 2 refills | Status: DC

## 2022-09-19 MED ORDER — DAPAGLIFLOZIN PROPANEDIOL 10 MG PO TABS: 10.0000 mg | ORAL_TABLET | Freq: Every day | ORAL | 1 refills | Status: DC

## 2022-09-19 NOTE — Progress Notes (Signed)
Subjective:    Patient ID: Amanda Browning, female    DOB: 1945/06/14, 77 y.o.   MRN: [id number]  Patient here for  Chief Complaint  Patient presents with   Medical Management of Chronic Issues    HPI Here for follow up regarding afib, GERD, hypertension, hypothyroidism, hypercholesterolemia and hyperglycemia. Saw urology - urge incontinence - gemtesa.  Has helped some.  She has had issues recently with her back - right low back.  Also pain going down right leg.  Xray 01/2022 - Diffuse osteopenia. Lumbar spine scoliosis concave left. 3 mm retrolisthesis L2 on L3 and L3 on L4 again noted. 7 mm anterolisthesis L5 on S1 again noted. Diffuse multilevel degenerative change. No acute bony abnormality identified.  She has been seeing a [facility].  Back pain is better now. No pain now.  No chest pain or sob reported.  No cough or congestion.  No abdominal pain or bowel change reported.     Past Medical History:  Diagnosis Date   Anemia    Chicken pox    Colon polyps    Complication of anesthesia    doesn't take much anesthesia to put pt to sleep, slow to wake up   DDD (degenerative disc disease), cervical 02/05/2013   C5-C6 and C6-C7   GERD (gastroesophageal reflux disease)    Heart murmur    History of atrial fibrillation    History of chicken pox    Hypercholesterolemia    Hyperlipidemia    Hypertension    Hypothyroidism    Migraines    PONV (postoperative nausea and vomiting)    Sleep apnea    Squamous cell skin cancer    Past Surgical History:  Procedure Laterality Date   BREAST BIOPSY Right 2012   stereotactic   CARDIOVERSION N/A 04/29/2021   Procedure: CARDIOVERSION;  Surgeon: Antonieta Iba, MD;  Location: [facility];  Service: Cardiovascular;  Laterality: N/A;   CATARACT EXTRACTION W/PHACO Left 09/06/2021   Procedure: CATARACT EXTRACTION PHACO AND INTRAOCULAR LENS PLACEMENT (IOC) LEFT 6.60 00:43.8;  Surgeon: Nevada Crane, MD;  Location: [facility];   Service: Ophthalmology;  Laterality: Left;   CATARACT EXTRACTION W/PHACO Right 10/04/2021   Procedure: CATARACT EXTRACTION PHACO AND INTRAOCULAR LENS PLACEMENT (IOC) RIGHT 6.08 00:45.0;  Surgeon: Nevada Crane, MD;  Location: [facility];  Service: Ophthalmology;  Laterality: Right;   COLONOSCOPY N/A 03/23/2022   Procedure: COLONOSCOPY;  Surgeon: Toledo, Boykin Nearing, MD;  Location: [facility] ENDOSCOPY;  Service: Gastroenterology;  Laterality: N/A;   COLONOSCOPY WITH PROPOFOL N/A 02/23/2018   Procedure: COLONOSCOPY WITH PROPOFOL;  Surgeon: Scot Jun, MD;  Location: [facility] ENDOSCOPY;  Service: Endoscopy;  Laterality: N/A;   ESOPHAGOGASTRODUODENOSCOPY N/A 03/15/2021   Procedure: ESOPHAGOGASTRODUODENOSCOPY (EGD);  Surgeon: Toney Reil, MD;  Location: [facility] END[facility];  Service: Gastroenterology;  Laterality: N/A;   ESOPHAGOGASTRODUODENOSCOPY N/A 03/23/2022   Procedure: ESOPHAGOGASTRODUODENOSCOPY (EGD);  Surgeon: Toledo, Boykin Nearing, MD;  Location: [facility] ENDOSCOPY;  Service: Gastroenterology;  Laterality: N/A;   KNEE ARTHROPLASTY Right 04/17/2017   Procedure: COMPUTER ASSISTED TOTAL KNEE ARTHROPLASTY;  Surgeon: Donato Heinz, MD;  Location: [facility] ORS;  Service: Orthopedics;  Laterality: Right;   SKIN BIOPSY     VAGINAL HYSTERECTOMY  1994   ovaries not removed   WISDOM TOOTH EXTRACTION     Family History  Problem Relation Age of Onset   Heart disease Mother    Stroke Mother    Hypertension Mother    Heart attack Mother  Heart disease Father        enlarged heart   Stroke Father    Hypertension Father    Hyperlipidemia Father    Heart attack Father    Ovarian cancer Paternal Aunt    Breast cancer Sister 33   Cancer Sister        Breast Cancer   Diabetes type II Brother    Hypertension Brother    Colon cancer Neg Hx    Basal cell carcinoma Neg Hx    Melanoma Neg Hx    Squamous cell carcinoma Neg Hx    Social History   Socioeconomic History   Marital status: Divorced     Spouse name: Not on file   Number of children: 2   Years of education: 12   Highest education level: High school graduate  Occupational History   Not on file  Tobacco Use   Smoking status: Never    Passive exposure: Never   Smokeless tobacco: Never  Vaping Use   Vaping Use: Never used  Substance and Sexual Activity   Alcohol use: No    Alcohol/week: 0.0 standard drinks of alcohol   Drug use: No   Sexual activity: Never  Other Topics Concern   Not on file  Social History Narrative   Full Code   Alcohol - none   Never smoker and no smokeless tobacco   2 daughters   Social Determinants of Health   Financial Resource Strain: Low Risk  (07/28/2017)   Overall Financial Resource Strain (CARDIA)    Difficulty of Paying Living Expenses: Not hard at all  Food Insecurity: No Food Insecurity (07/28/2017)   Hunger Vital Sign    Worried About Running Out of Food in the Last Year: Never true    Ran Out of Food in the Last Year: Never true  Transportation Needs: No Transportation Needs (07/28/2017)   PRAPARE - Administrator, Civil Service (Medical): No    Lack of Transportation (Non-Medical): No  Physical Activity: Insufficiently Active (07/28/2017)   Exercise Vital Sign    Days of Exercise per Week: 4 days    Minutes of Exercise per Session: 30 min  Stress: No Stress Concern Present (07/28/2017)   [employer] of Occupational Health - Occupational Stress Questionnaire    Feeling of Stress : Not at all  Social Connections: Not on file     Review of Systems  Constitutional:  Negative for appetite change and unexpected weight change.  HENT:  Negative for congestion and sinus pressure.   Respiratory:  Negative for cough, chest tightness and shortness of breath.   Cardiovascular:  Negative for chest pain, palpitations and leg swelling.  Gastrointestinal:  Negative for abdominal pain, diarrhea, nausea and vomiting.  Genitourinary:  Negative for dysuria.        Urinary symptoms better as outlined.   Musculoskeletal:        Back and leg pain as outlined.   Skin:  Negative for color change and rash.  Neurological:  Negative for dizziness and headaches.  Psychiatric/Behavioral:  Negative for agitation and dysphoric mood.        Objective:     BP 132/76   Pulse 67   Temp 98 F (36.7 C)   Resp 16   Ht 5\' 3"  (1.6 m)   Wt 219 lb (99.3 kg)   SpO2 98%   BMI 38.79 kg/m  Wt Readings from Last 3 Encounters:  09/19/22 219 lb (99.3 kg)  07/12/22 220  lb 4.8 oz (99.9 kg)  05/30/22 215 lb (97.5 kg)    Physical Exam Vitals reviewed.  Constitutional:      General: She is not in acute distress.    Appearance: Normal appearance.  HENT:     Head: Normocephalic and atraumatic.     Right Ear: External ear normal.     Left Ear: External ear normal.  Eyes:     General: No scleral icterus.       Right eye: No discharge.        Left eye: No discharge.     Conjunctiva/sclera: Conjunctivae normal.  Neck:     Thyroid: No thyromegaly.  Cardiovascular:     Rate and Rhythm: Normal rate and regular rhythm.  Pulmonary:     Effort: No respiratory distress.     Breath sounds: Normal breath sounds. No wheezing.  Abdominal:     General: Bowel sounds are normal.     Palpations: Abdomen is soft.     Tenderness: There is no abdominal tenderness.  Musculoskeletal:        General: No swelling or tenderness.     Cervical back: Neck supple. No tenderness.  Lymphadenopathy:     Cervical: No cervical adenopathy.  Skin:    Findings: No erythema or rash.  Neurological:     Mental Status: She is alert.  Psychiatric:        Mood and Affect: Mood normal.        Behavior: Behavior normal.      Outpatient Encounter Medications as of 09/19/2022  Medication Sig   acetaminophen (TYLENOL) 325 MG tablet Take 650 mg by mouth 4 (four) times daily as needed.   apixaban (ELIQUIS) 5 MG TABS tablet Take 1 tablet (5 mg total) by mouth 2 (two) times daily.   cetirizine  (ZYRTEC) 10 MG tablet Take 10 mg by mouth every evening.   dapagliflozin propanediol (FARXIGA) 10 MG TABS tablet Take 1 tablet (10 mg total) by mouth daily before breakfast.   diltiazem (CARDIZEM CD) 240 MG 24 hr capsule TAKE 1 CAPSULE EVERY DAY   ferrous sulfate 325 (65 FE) MG tablet Take 325 mg by mouth in the morning.   levothyroxine (SYNTHROID) 75 MCG tablet TAKE 1 TABLET EVERY DAY AT 6AM   losartan (COZAAR) 25 MG tablet TAKE 1/2 TABLET EVERY MORNING   magnesium oxide (MAG-OX) 400 MG tablet Take 1 tablet (400 mg total) by mouth 2 (two) times daily.   metoprolol tartrate (LOPRESSOR) 100 MG tablet TAKE 1 TABLET TWICE DAILY   Multiple Vitamin (MULTIVITAMIN WITH MINERALS) TABS tablet Take 1 tablet by mouth in the morning.   omeprazole (PRILOSEC) 20 MG capsule TAKE 1 CAPSULE TWICE DAILY BEFORE MEALS   potassium chloride (MICRO-K) 10 MEQ CR capsule Take 10 mEq with torsemide 20 mg, take 20 Meq with torsemide 40 mg   rosuvastatin (CRESTOR) 20 MG tablet Take 1 tablet (20 mg total) by mouth daily.   sertraline (ZOLOFT) 50 MG tablet TAKE 1 TABLET DAILY   spironolactone (ALDACTONE) 25 MG tablet Take 1 tablet (25 mg total) by mouth daily.   torsemide (DEMADEX) 20 MG tablet Weight <205 no torsemide Weight 205-211 take torsemide 20 mg daily and potassium 10 meq Weight >212 take torsemide 40 mg daily with potassium 20 mEq   Vibegron (GEMTESA) 75 MG TABS Take 1 tablet (75 mg total) by mouth daily.   Vibegron (GEMTESA) 75 MG TABS Take 1 tablet (75 mg total) by mouth daily.   [DISCONTINUED] dapagliflozin  propanediol (FARXIGA) 10 MG TABS tablet Take 1 tablet (10 mg total) by mouth daily before breakfast.   [DISCONTINUED] spironolactone (ALDACTONE) 25 MG tablet TAKE 1 TABLET EVERY DAY   No facility-administered encounter medications on file as of 09/19/2022.     Lab Results  Component Value Date   WBC 5.5 09/19/2022   HGB 13.1 09/19/2022   HCT 40.8 09/19/2022   PLT 214.0 09/19/2022   GLUCOSE 98 09/19/2022    CHOL 161 09/19/2022   TRIG 87.0 09/19/2022   HDL 68.10 09/19/2022   LDLDIRECT 131.0 03/19/2013   LDLCALC 75 09/19/2022   ALT 17 09/19/2022   AST 20 09/19/2022   NA 139 09/19/2022   K 4.6 09/19/2022   CL 104 09/19/2022   CREATININE 1.21 (H) 09/19/2022   BUN 18 09/19/2022   CO2 25 09/19/2022   TSH 1.75 05/19/2022   INR 1.2 03/12/2021   HGBA1C 5.4 09/19/2022    MM DIAG BREAST TOMO UNI RIGHT  Result Date: 05/04/2022 CLINICAL DATA:  77 year old female presenting as a recall from screening for possible right breast distortion and calcifications. EXAM: DIGITAL DIAGNOSTIC UNILATERAL RIGHT MAMMOGRAM WITH TOMOSYNTHESIS TECHNIQUE: Right digital diagnostic mammography and breast tomosynthesis was performed. COMPARISON:  Previous exam(s). ACR Breast Density Category b: There are scattered areas of fibroglandular density. FINDINGS: Spot compression tomosynthesis and spot 2D magnification views as well as full true lateral tomosynthesis views of the right breast were performed for a questioned area of distortion best seen on MLO view in the central breast as well as for calcifications in the lower inner breast. On the additional imaging the possible distortion resolves and is most consistent with normal overlapping tissue. The calcifications in the lower inner left breast have been stable dating back to at least 2021, consistent with a benign process. No suspicious linear or branching forms. IMPRESSION: 1. Resolution of the questioned distortion on screening mammogram in the right breast. 2. The calcifications in the lower inner right breast have been stable dating back to at least 2021 consistent with a benign process. RECOMMENDATION: Screening mammogram in one year.(Code:[id number]-B-01Y) I have discussed the findings and recommendations with the patient. If applicable, a reminder letter will be sent to the patient regarding the next appointment. BI-RADS CATEGORY  2: Benign. Electronically Signed   By: Emmaline Kluver M.D.   On: 05/04/2022 15:49      Assessment & Plan:  Persistent atrial fibrillation [facility]) Assessment & Plan: On eliquis.  Continues on diltiazem and metoprolol.     Hypercholesterolemia Assessment & Plan: On crestor.  Low cholesterol diet and exercise.  Follow lipid panel and liver function tests.   Orders: -     CBC with Differential/Platelet -     Hepatic function panel -     Lipid panel  Hyperglycemia Assessment & Plan: Low carb diet and exercise.  Follow met b and a1c.   Lab Results  Component Value Date   HGBA1C 5.4 09/19/2022    Orders: -     Hemoglobin A1c  Primary hypertension Assessment & Plan: Currently on cardizem and lopressor. Takes Torsemide as outlined.  Blood pressure controlled.  Check metabolic panel.   Orders: -     Basic metabolic panel  Anemia, unspecified type Assessment & Plan: Follow cbc.    Gastroesophageal reflux disease, unspecified whether esophagitis present Assessment & Plan: Continue prilosec.  No upper symptoms.  Swallowing stable.    Hiatal hernia with GERD and esophagitis Assessment & Plan: Found to have large hiatal hernia  and ulcer with no active bleeding.  Saw GI.  Recommended PPI bid. Swallowing stable.    Hypothyroidism, unspecified type Assessment & Plan: On synthroid.  Follow tsh.    OAB (overactive bladder) Assessment & Plan: Dr Richardo Hanks - PVR 1cc.  Recommended Gemtesa.  06/2022 - reevaluation - recommended continue gemtesa.  Has helped.    OSA (obstructive sleep apnea) Assessment & Plan: Moderate to severe OSA.  Seeing pulmonary.  Recommended CPAP (autoset 5-15).     Stress Assessment & Plan: Doing well.  Continue zoloft.    Other orders -     Dapagliflozin Propanediol; Take 1 tablet (10 mg total) by mouth daily before breakfast.  Dispense: 90 tablet; Refill: 1 -     Spironolactone; Take 1 tablet (25 mg total) by mouth daily.  Dispense: 90 tablet; Refill: 2     Dale Wade, MD

## 2022-09-25 ENCOUNTER — Encounter: Payer: Self-pay | Admitting: Internal Medicine

## 2022-09-25 NOTE — Assessment & Plan Note (Signed)
On synthroid.  Follow tsh.   

## 2022-09-25 NOTE — Assessment & Plan Note (Signed)
Moderate to severe OSA.  Seeing pulmonary.  Recommended CPAP (autoset 5-15).   

## 2022-09-25 NOTE — Assessment & Plan Note (Signed)
On crestor.  Low cholesterol diet and exercise.  Follow lipid panel and liver function tests.   

## 2022-09-25 NOTE — Assessment & Plan Note (Signed)
Follow cbc.  

## 2022-09-25 NOTE — Assessment & Plan Note (Signed)
Currently on cardizem and lopressor. Takes Torsemide as outlined.  Blood pressure controlled.  Check metabolic panel.  

## 2022-09-25 NOTE — Assessment & Plan Note (Signed)
Low carb diet and exercise.  Follow met b and a1c.   Lab Results  Component Value Date   HGBA1C 5.4 09/19/2022

## 2022-09-25 NOTE — Assessment & Plan Note (Signed)
Continue prilosec.  No upper symptoms.  Swallowing stable.  

## 2022-09-25 NOTE — Assessment & Plan Note (Signed)
On eliquis.  Continues on diltiazem and metoprolol.   

## 2022-09-25 NOTE — Assessment & Plan Note (Signed)
Dr Richardo Hanks - PVR 1cc.  Recommended Gemtesa.  06/2022 - reevaluation - recommended continue gemtesa.  Has helped.

## 2022-09-25 NOTE — Assessment & Plan Note (Signed)
Doing well.  Continue zoloft.  

## 2022-09-25 NOTE — Assessment & Plan Note (Signed)
Found to have large hiatal hernia and ulcer with no active bleeding.  Saw GI.  Recommended PPI bid. Swallowing stable.  

## 2022-10-07 DIAGNOSIS — D0472 Carcinoma in situ of skin of left lower limb, including hip: Secondary | ICD-10-CM | POA: Diagnosis not present

## 2022-10-07 DIAGNOSIS — D2272 Melanocytic nevi of left lower limb, including hip: Secondary | ICD-10-CM | POA: Diagnosis not present

## 2022-10-07 DIAGNOSIS — D485 Neoplasm of uncertain behavior of skin: Secondary | ICD-10-CM | POA: Diagnosis not present

## 2022-10-07 DIAGNOSIS — D2261 Melanocytic nevi of right upper limb, including shoulder: Secondary | ICD-10-CM | POA: Diagnosis not present

## 2022-10-07 DIAGNOSIS — L570 Actinic keratosis: Secondary | ICD-10-CM | POA: Diagnosis not present

## 2022-10-07 DIAGNOSIS — L821 Other seborrheic keratosis: Secondary | ICD-10-CM | POA: Diagnosis not present

## 2022-10-07 DIAGNOSIS — D2271 Melanocytic nevi of right lower limb, including hip: Secondary | ICD-10-CM | POA: Diagnosis not present

## 2022-10-07 DIAGNOSIS — C44319 Basal cell carcinoma of skin of other parts of face: Secondary | ICD-10-CM | POA: Diagnosis not present

## 2022-10-07 DIAGNOSIS — D225 Melanocytic nevi of trunk: Secondary | ICD-10-CM | POA: Diagnosis not present

## 2022-10-07 DIAGNOSIS — R208 Other disturbances of skin sensation: Secondary | ICD-10-CM | POA: Diagnosis not present

## 2022-10-07 DIAGNOSIS — D2262 Melanocytic nevi of left upper limb, including shoulder: Secondary | ICD-10-CM | POA: Diagnosis not present

## 2022-10-12 ENCOUNTER — Ambulatory Visit: Payer: Medicare HMO | Admitting: Urology

## 2022-10-31 ENCOUNTER — Other Ambulatory Visit: Payer: Self-pay | Admitting: Internal Medicine

## 2022-11-01 DIAGNOSIS — M9904 Segmental and somatic dysfunction of sacral region: Secondary | ICD-10-CM | POA: Diagnosis not present

## 2022-11-01 DIAGNOSIS — M461 Sacroiliitis, not elsewhere classified: Secondary | ICD-10-CM | POA: Diagnosis not present

## 2022-11-01 DIAGNOSIS — M9903 Segmental and somatic dysfunction of lumbar region: Secondary | ICD-10-CM | POA: Diagnosis not present

## 2022-11-01 DIAGNOSIS — M5441 Lumbago with sciatica, right side: Secondary | ICD-10-CM | POA: Diagnosis not present

## 2022-11-02 NOTE — Progress Notes (Signed)
11/03/2022 10:53 AM   Amanda Browning 03-12-1946 [id number]  Referring provider: Dale Max, MD [address] [place],  [place] [address]  Urological history: 1. Urge incontinence/OAB -Contributing factors of age, vaginal atrophy, diuretics, sleep apnea, hysterectomy, hypertension, congestive heart failure, obesity, anxiety and pelvic floor prolapse -Gemtesa 75 mg daily   Chief Complaint  Patient presents with   Follow-up    HPI: Amanda Browning is a 77 y.o. female who presents today for 3 month follow up.   Previous records reviewed.  She is having 1-7 daytime urinations, 1-2 episodes of nocturia with a mild urge to urinate.  She has urge incontinence 1-2 times daily.  She wears 2 panty liners daily.  She does restrict fluid intake.  She does not engage in toilet mapping.  Patient denies any modifying or aggravating factors.  Patient denies any recent UTI's, gross hematuria, dysuria or suprapubic/flank pain.  Patient denies any fevers, chills, nausea or vomiting.    PVR 0 mL  PMH: Past Medical History:  Diagnosis Date   Anemia    Chicken pox    Colon polyps    Complication of anesthesia    doesn't take much anesthesia to put pt to sleep, slow to wake up   DDD (degenerative disc disease), cervical 02/05/2013   C5-C6 and C6-C7   GERD (gastroesophageal reflux disease)    Heart murmur    History of atrial fibrillation    History of chicken pox    Hypercholesterolemia    Hyperlipidemia    Hypertension    Hypothyroidism    Migraines    PONV (postoperative nausea and vomiting)    Sleep apnea    Squamous cell skin cancer     Surgical History: Past Surgical History:  Procedure Laterality Date   BREAST BIOPSY Right 2012   stereotactic   CARDIOVERSION N/A 04/29/2021   Procedure: CARDIOVERSION;  Surgeon: Antonieta Iba, MD;  Location: [facility] ORS;  Service: Cardiovascular;  Laterality: N/A;   CATARACT EXTRACTION W/PHACO Left 09/06/2021    Procedure: CATARACT EXTRACTION PHACO AND INTRAOCULAR LENS PLACEMENT (IOC) LEFT 6.60 00:43.8;  Surgeon: Nevada Crane, MD;  Location: [facility];  Service: Ophthalmology;  Laterality: Left;   CATARACT EXTRACTION W/PHACO Right 10/04/2021   Procedure: CATARACT EXTRACTION PHACO AND INTRAOCULAR LENS PLACEMENT (IOC) RIGHT 6.08 00:45.0;  Surgeon: Nevada Crane, MD;  Location: [facility];  Service: Ophthalmology;  Laterality: Right;   COLONOSCOPY N/A 03/23/2022   Procedure: COLONOSCOPY;  Surgeon: Toledo, Boykin Nearing, MD;  Location: [facility] ENDOSCOPY;  Service: Gastroenterology;  Laterality: N/A;   COLONOSCOPY WITH PROPOFOL N/A 02/23/2018   Procedure: COLONOSCOPY WITH PROPOFOL;  Surgeon: Scot Jun, MD;  Location: [facility] ENDOSCOPY;  Service: Endoscopy;  Laterality: N/A;   ESOPHAGOGASTRODUODENOSCOPY N/A 03/15/2021   Procedure: ESOPHAGOGASTRODUODENOSCOPY (EGD);  Surgeon: Toney Reil, MD;  Location: [facility] END[facility];  Service: Gastroenterology;  Laterality: N/A;   ESOPHAGOGASTRODUODENOSCOPY N/A 03/23/2022   Procedure: ESOPHAGOGASTRODUODENOSCOPY (EGD);  Surgeon: Toledo, Boykin Nearing, MD;  Location: [facility] ENDOSCOPY;  Service: Gastroenterology;  Laterality: N/A;   KNEE ARTHROPLASTY Right 04/17/2017   Procedure: COMPUTER ASSISTED TOTAL KNEE ARTHROPLASTY;  Surgeon: Donato Heinz, MD;  Location: [facility] ORS;  Service: Orthopedics;  Laterality: Right;   SKIN BIOPSY     VAGINAL HYSTERECTOMY  1994   ovaries not removed   WISDOM TOOTH EXTRACTION      Home Medications:  Allergies as of 11/03/2022       Reactions   Codeine Other (  See Comments)   Altered mental status- "Cannot function"        Medication List        Accurate as of November 03, 2022 10:53 AM. If you have any questions, ask your nurse or doctor.          acetaminophen 325 MG tablet Commonly known as: TYLENOL Take 650 mg by mouth 4 (four) times daily as needed.   apixaban 5 MG Tabs tablet Commonly known as:  ELIQUIS Take 1 tablet (5 mg total) by mouth 2 (two) times daily.   cetirizine 10 MG tablet Commonly known as: ZYRTEC Take 10 mg by mouth every evening.   dapagliflozin propanediol 10 MG Tabs tablet Commonly known as: Farxiga Take 1 tablet (10 mg total) by mouth daily before breakfast.   diltiazem 240 MG 24 hr capsule Commonly known as: CARDIZEM CD TAKE 1 CAPSULE EVERY DAY   ferrous sulfate 325 (65 FE) MG tablet Take 325 mg by mouth in the morning.   Gemtesa 75 MG Tabs Generic drug: Vibegron Take 1 tablet (75 mg total) by mouth daily.   Gemtesa 75 MG Tabs Generic drug: Vibegron Take 1 tablet (75 mg total) by mouth daily.   levothyroxine 75 MCG tablet Commonly known as: SYNTHROID TAKE 1 TABLET EVERY DAY AT 6AM   losartan 25 MG tablet Commonly known as: COZAAR TAKE 1/2 TABLET EVERY MORNING   magnesium oxide 400 MG tablet Commonly known as: MAG-OX Take 1 tablet (400 mg total) by mouth 2 (two) times daily.   metoprolol tartrate 100 MG tablet Commonly known as: LOPRESSOR TAKE 1 TABLET TWICE DAILY   multivitamin with minerals Tabs tablet Take 1 tablet by mouth in the morning.   omeprazole 20 MG capsule Commonly known as: PRILOSEC TAKE 1 CAPSULE TWICE DAILY BEFORE MEALS   potassium chloride 10 MEQ CR capsule Commonly known as: MICRO-K Take 10 mEq with torsemide 20 mg, take 20 Meq with torsemide 40 mg   rosuvastatin 20 MG tablet Commonly known as: Crestor Take 1 tablet (20 mg total) by mouth daily.   sertraline 50 MG tablet Commonly known as: ZOLOFT TAKE 1 TABLET DAILY   spironolactone 25 MG tablet Commonly known as: ALDACTONE Take 1 tablet (25 mg total) by mouth daily.   torsemide 20 MG tablet Commonly known as: DEMADEX Weight <205 no torsemide Weight 205-211 take torsemide 20 mg daily and potassium 10 meq Weight >212 take torsemide 40 mg daily with potassium 20 mEq        Allergies:  Allergies  Allergen Reactions   Codeine Other (See Comments)     Altered mental status- "Cannot function"    Family History: Family History  Problem Relation Age of Onset   Heart disease Mother    Stroke Mother    Hypertension Mother    Heart attack Mother    Heart disease Father        enlarged heart   Stroke Father    Hypertension Father    Hyperlipidemia Father    Heart attack Father    Ovarian cancer Paternal Aunt    Breast cancer Sister 20   Cancer Sister        Breast Cancer   Diabetes type II Brother    Hypertension Brother    Colon cancer Neg Hx    Basal cell carcinoma Neg Hx    Melanoma Neg Hx    Squamous cell carcinoma Neg Hx     Social History:  reports that she has never smoked.  She has never been exposed to tobacco smoke. She has never used smokeless tobacco. She reports that she does not drink alcohol and does not use drugs.  ROS: Pertinent ROS in HPI  Physical Exam: Ht 5\' 3"  (1.6 m)   Wt 202 lb (91.6 kg)   BMI 35.78 kg/m   Constitutional:  Well nourished. Alert and oriented, No acute distress. HEENT:  AT, moist mucus membranes.  Trachea midline Cardiovascular: No clubbing, cyanosis, or edema. Respiratory: Normal respiratory effort, no increased work of breathing. Neurologic: Grossly intact, no focal deficits, moving all 4 extremities. Psychiatric: Normal mood and affect.    Laboratory Data: Lab Results  Component Value Date   CREATININE 1.21 (H) 09/19/2022    Lab Results  Component Value Date   HGBA1C 5.4 09/19/2022    Lab Results  Component Value Date   TSH 1.75 05/19/2022       Component Value Date/Time   CHOL 161 09/19/2022 1352   HDL 68.10 09/19/2022 1352   CHOLHDL 2 09/19/2022 1352   VLDL 17.4 09/19/2022 1352   LDLCALC 75 09/19/2022 1352   LDLCALC 95 01/20/2017 1616    Lab Results  Component Value Date   AST 20 09/19/2022   Lab Results  Component Value Date   ALT 17 09/19/2022  I have reviewed the labs.   Pertinent Imaging:  11/03/22 10:44  Scan Result 0 ml     Assessment  & Plan:    1. Urge incontinence/OAB -no bothersome urinary issues  -At goal with Gemtesa   Return in about 1 year (around 11/03/2023) for PVR and OAB questionnaire.  These notes generated with voice recognition software. I apologize for typographical errors.  Cloretta Ned  [facility] [address]  [address] [place], [place] [address] [phone]

## 2022-11-03 ENCOUNTER — Ambulatory Visit: Payer: Medicare HMO | Admitting: Urology

## 2022-11-03 ENCOUNTER — Encounter: Payer: Self-pay | Admitting: Urology

## 2022-11-03 VITALS — Ht 63.0 in | Wt 202.0 lb

## 2022-11-03 DIAGNOSIS — N3941 Urge incontinence: Secondary | ICD-10-CM

## 2022-11-03 LAB — BLADDER SCAN AMB NON-IMAGING: Scan Result: 0

## 2022-11-11 DIAGNOSIS — C44319 Basal cell carcinoma of skin of other parts of face: Secondary | ICD-10-CM | POA: Diagnosis not present

## 2022-11-11 DIAGNOSIS — D2339 Other benign neoplasm of skin of other parts of face: Secondary | ICD-10-CM | POA: Diagnosis not present

## 2022-11-16 DIAGNOSIS — D224 Melanocytic nevi of scalp and neck: Secondary | ICD-10-CM | POA: Diagnosis not present

## 2022-11-16 DIAGNOSIS — L905 Scar conditions and fibrosis of skin: Secondary | ICD-10-CM | POA: Diagnosis not present

## 2022-11-16 DIAGNOSIS — D0472 Carcinoma in situ of skin of left lower limb, including hip: Secondary | ICD-10-CM | POA: Diagnosis not present

## 2022-12-20 ENCOUNTER — Other Ambulatory Visit: Payer: Self-pay | Admitting: Cardiovascular Disease

## 2022-12-20 DIAGNOSIS — I4819 Other persistent atrial fibrillation: Secondary | ICD-10-CM

## 2022-12-20 NOTE — Telephone Encounter (Signed)
Refill request

## 2022-12-20 NOTE — Telephone Encounter (Signed)
Prescription refill request for Eliquis received. Indication: Afib  Last office visit: 11/29/21 Mariah Milling)  Scr: 1.21 (09/19/22)  Age: 77 Weight: 91.6kg  Office visit overdue. Pt has scheduled appt with Dr Mariah Milling on 01/24/23. Refill sent.

## 2022-12-27 DIAGNOSIS — M9904 Segmental and somatic dysfunction of sacral region: Secondary | ICD-10-CM | POA: Diagnosis not present

## 2022-12-27 DIAGNOSIS — M461 Sacroiliitis, not elsewhere classified: Secondary | ICD-10-CM | POA: Diagnosis not present

## 2022-12-27 DIAGNOSIS — M9903 Segmental and somatic dysfunction of lumbar region: Secondary | ICD-10-CM | POA: Diagnosis not present

## 2022-12-27 DIAGNOSIS — M5441 Lumbago with sciatica, right side: Secondary | ICD-10-CM | POA: Diagnosis not present

## 2023-01-05 DIAGNOSIS — H26493 Other secondary cataract, bilateral: Secondary | ICD-10-CM | POA: Diagnosis not present

## 2023-01-05 DIAGNOSIS — I10 Essential (primary) hypertension: Secondary | ICD-10-CM | POA: Diagnosis not present

## 2023-01-05 DIAGNOSIS — H43813 Vitreous degeneration, bilateral: Secondary | ICD-10-CM | POA: Diagnosis not present

## 2023-01-05 DIAGNOSIS — H35033 Hypertensive retinopathy, bilateral: Secondary | ICD-10-CM | POA: Diagnosis not present

## 2023-01-23 NOTE — Progress Notes (Unsigned)
Cardiology Office Note  Date:  01/24/2023   ID:  Amanda, Browning Sep 10, 1945, MRN [id number]  PCP:  Amanda Millersport, MD   Chief Complaint  Patient presents with   Follow-up    No new cardiac issues or concerns but some shortness of breath and wheezing    HPI:  Amanda Browning is a 77 y.o. female with a hx of  hypertension,  hyperlipidemia  [facility] for GI bleed,  A. fib RVR , acute diastolic CHF Started on Eliquis March 26, 2021 Torsemide dose decreased for renal dysfunction Admission to [facility] diastolic CHF requiring IV Lasix, discharged on torsemide 40 daily, BNP 411, had 11 L diuresis 15 pounds down Baseline weight 205 pounds Echocardiogram on 04/21/21 showed new RV dilation and systolic dysfunction.  CTA showed acute right lower lobe pulmonary embolism. Large hiatal hernia EF 55 to 60%, moderately dilated left atrium, RVSP 46 Who presents for routine follow-up of her diastolic CHF, afib  LOV 8//23 Cardioversion 04/29/21  In follow-up today, daughter on the phone Daughter concerned about increased wheezing and shortness of breath on exertion [name] retired last month from [employer] Was doing 10,000 steps a day, now very sedentary, no regular walking program  Started on farxiga 3-4 months ago, At that time cut back on her torsemide now only takes it very sparingly Denies significant leg swelling or abdominal distention  [place], does not feel she has a place to walk  Reports GERD symptoms relatively well-controlled on omeprazole twice daily Has large hiatal hernia  Denies tachypalpitations concerning for paroxysmal atrial fibrillation  Lab work reviewed Total cholesterol 161 LDL 75 A1c 5.4  EKG personally reviewed by myself on todays visit EKG Interpretation Date/Time:  Tuesday January 24 2023 10:46:28 EDT Ventricular Rate:  56 PR Interval:  244 QRS Duration:  82 QT Interval:  442 QTC  Calculation: 426 R Axis:   -10  Text Interpretation: Sinus bradycardia with 1st degree A-V block When compared with ECG of 29-Apr-2021 08:00, No significant change was found Confirmed by Julien Nordmann [phone]) on 01/24/2023 10:55:58 AM    Large hiatal hernia on CT 11/22 Wondering whether to have surgery  Prior records from November 2022 reviewed GI bleeding 4 days prior to admission symptoms of hypotensive, weakness Hemoglobin 7.2 on arrival, was transfused EGD November 28, ulcers noted, no active bleeding, started on PPI twice daily   PMH:   has a past medical history of Anemia, Chicken pox, Colon polyps, Complication of anesthesia, DDD (degenerative disc disease), cervical (02/05/2013), GERD (gastroesophageal reflux disease), Heart murmur, History of atrial fibrillation, History of chicken pox, Hypercholesterolemia, Hyperlipidemia, Hypertension, Hypothyroidism, Migraines, PONV (postoperative nausea and vomiting), Sleep apnea, and Squamous cell skin cancer.  PSH:    Past Surgical History:  Procedure Laterality Date   BREAST BIOPSY Right 2012   stereotactic   CARDIOVERSION N/A 04/29/2021   Procedure: CARDIOVERSION;  Surgeon: Antonieta Iba, MD;  Location: [facility] ORS;  Service: Cardiovascular;  Laterality: N/A;   CATARACT EXTRACTION W/PHACO Left 09/06/2021   Procedure: CATARACT EXTRACTION PHACO AND INTRAOCULAR LENS PLACEMENT (IOC) LEFT 6.60 00:43.8;  Surgeon: Nevada Crane, MD;  Location: [facility];  Service: Ophthalmology;  Laterality: Left;   CATARACT EXTRACTION W/PHACO Right 10/04/2021   Procedure: CATARACT EXTRACTION PHACO AND INTRAOCULAR LENS PLACEMENT (IOC) RIGHT 6.08 00:45.0;  Surgeon: Nevada Crane, MD;  Location: [facility];  Service: Ophthalmology;  Laterality: Right;   COLONOSCOPY N/A 03/23/2022  Procedure: COLONOSCOPY;  Surgeon: Toledo, Boykin Nearing, MD;  Location: [facility] ENDOSCOPY;  Service: Gastroenterology;  Laterality: N/A;   COLONOSCOPY WITH  PROPOFOL N/A 02/23/2018   Procedure: COLONOSCOPY WITH PROPOFOL;  Surgeon: Scot Jun, MD;  Location: [facility];  Service: Endoscopy;  Laterality: N/A;   ESOPHAGOGASTRODUODENOSCOPY N/A 03/15/2021   Procedure: ESOPHAGOGASTRODUODENOSCOPY (EGD);  Surgeon: Toney Reil, MD;  Location: [facility] ENDOSCOPY;  Service: Gastroenterology;  Laterality: N/A;   ESOPHAGOGASTRODUODENOSCOPY N/A 03/23/2022   Procedure: ESOPHAGOGASTRODUODENOSCOPY (EGD);  Surgeon: Toledo, Boykin Nearing, MD;  Location: [facility] ENDOSCOPY;  Service: Gastroenterology;  Laterality: N/A;   KNEE ARTHROPLASTY Right 04/17/2017   Procedure: COMPUTER ASSISTED TOTAL KNEE ARTHROPLASTY;  Surgeon: Donato Heinz, MD;  Location: [facility] ORS;  Service: Orthopedics;  Laterality: Right;   SKIN BIOPSY     VAGINAL HYSTERECTOMY  1994   ovaries not removed   WISDOM TOOTH EXTRACTION      Current Outpatient Medications  Medication Sig Dispense Refill   acetaminophen (TYLENOL) 325 MG tablet Take 650 mg by mouth 4 (four) times daily as needed.     apixaban (ELIQUIS) 5 MG TABS tablet TAKE 1 TABLET TWICE DAILY 180 tablet 1   cetirizine (ZYRTEC) 10 MG tablet Take 10 mg by mouth every evening.     dapagliflozin propanediol (FARXIGA) 10 MG TABS tablet Take 1 tablet (10 mg total) by mouth daily before breakfast. 90 tablet 1   diltiazem (CARDIZEM CD) 240 MG 24 hr capsule TAKE 1 CAPSULE EVERY DAY 90 capsule 3   ferrous sulfate 325 (65 FE) MG tablet Take 325 mg by mouth in the morning.     levothyroxine (SYNTHROID) 75 MCG tablet TAKE 1 TABLET EVERY DAY AT 6AM 90 tablet 3   losartan (COZAAR) 25 MG tablet TAKE 1/2 TABLET EVERY MORNING 45 tablet 3   magnesium oxide (MAG-OX) 400 MG tablet Take 1 tablet (400 mg total) by mouth 2 (two) times daily. 60 tablet 2   metoprolol tartrate (LOPRESSOR) 100 MG tablet TAKE 1 TABLET TWICE DAILY 180 tablet 3   Multiple Vitamin (MULTIVITAMIN WITH MINERALS) TABS tablet Take 1 tablet by mouth in the morning.     omeprazole  (PRILOSEC) 20 MG capsule TAKE 1 CAPSULE TWICE DAILY BEFORE MEALS 180 capsule 3   potassium chloride (MICRO-K) 10 MEQ CR capsule Take 10 mEq with torsemide 20 mg, take 20 Meq with torsemide 40 mg 90 capsule 3   rosuvastatin (CRESTOR) 20 MG tablet Take 1 tablet (20 mg total) by mouth daily. 90 tablet 3   sertraline (ZOLOFT) 50 MG tablet TAKE 1 TABLET DAILY 90 tablet 1   spironolactone (ALDACTONE) 25 MG tablet Take 1 tablet (25 mg total) by mouth daily. 90 tablet 2   torsemide (DEMADEX) 20 MG tablet Weight <205 no torsemide Weight 205-211 take torsemide 20 mg daily and potassium 10 meq Weight >212 take torsemide 40 mg daily with potassium 20 mEq 60 tablet 1   Vibegron (GEMTESA) 75 MG TABS Take 1 tablet (75 mg total) by mouth daily. 30 tablet 3   Vibegron (GEMTESA) 75 MG TABS Take 1 tablet (75 mg total) by mouth daily. 90 tablet 3   No current facility-administered medications for this visit.     Allergies:   Codeine   Social History:  The patient  reports that she has never smoked. She has never been exposed to tobacco smoke. She has never used smokeless tobacco. She reports that she does not drink alcohol and does not use drugs.   Family History:  family history includes Breast cancer (age of onset: 86) in her sister; Cancer in her sister; Diabetes type II in her brother; Heart attack in her father and mother; Heart disease in her father and mother; Hyperlipidemia in her father; Hypertension in her brother, father, and mother; Ovarian cancer in her paternal aunt; Stroke in her father and mother.    Review of Systems: Review of Systems  Constitutional: Negative.   HENT: Negative.    Respiratory: Negative.    Cardiovascular: Negative.   Gastrointestinal: Negative.   Musculoskeletal: Negative.   Neurological: Negative.   Psychiatric/Behavioral: Negative.    All other systems reviewed and are negative.   PHYSICAL EXAM: VS:  BP 122/70 (BP Location: Left Arm, Patient Position: Sitting,  Cuff Size: Large)   Pulse (!) 56   Ht 5\' 3"  (1.6 m)   Wt 214 lb 3.2 oz (97.2 kg)   SpO2 91%   BMI 37.94 kg/m  , BMI Body mass index is 37.94 kg/m. Constitutional:  oriented to person, place, and time. No distress.  HENT:  Head: Grossly normal Eyes:  no discharge. No scleral icterus.  Neck: No JVD, no carotid bruits  Cardiovascular: Regular rate and rhythm, no murmurs appreciated Pulmonary/Chest: Clear to auscultation bilaterally, no wheezes or rails Abdominal: Soft.  no distension.  no tenderness.  Musculoskeletal: Normal range of motion Neurological:  normal muscle tone. Coordination normal. No atrophy Skin: Skin warm and dry Psychiatric: normal affect, pleasant  Recent Labs: 05/19/2022: TSH 1.75 09/19/2022: ALT 17; BUN 18; Creatinine, Ser 1.21; Hemoglobin 13.1; Platelets 214.0; Potassium 4.6; Sodium 139    Lipid Panel Lab Results  Component Value Date   CHOL 161 09/19/2022   HDL 68.10 09/19/2022   LDLCALC 75 09/19/2022   TRIG 87.0 09/19/2022    Wt Readings from Last 3 Encounters:  01/24/23 214 lb 3.2 oz (97.2 kg)  11/03/22 202 lb (91.6 kg)  09/19/22 219 lb (99.3 kg)     ASSESSMENT AND PLAN:  Problem List Items Addressed This Visit       Cardiology Problems   Hypertension   Hypercholesterolemia   Atrial fibrillation (HCC) - Primary   Relevant Orders   EKG 12-Lead (Completed)   Other Visit Diagnoses     Acute on chronic diastolic CHF (congestive heart failure) (HCC)       Dyspnea, unspecified type          Persistent atrial fibrillation Prior cardioversion Maintaining normal sinus rhythm continue Eliquis 5 twice daily, metoprolol tartrate 100 twice daily, diltiazem ER 240  Chronic diastolic CHF Daughter concerned for shortness of breath and occasional wheezing Continue Farxiga, restart torsemide 20 mg twice a week with potassium 10 And extra potassium as needed for weight gain, ankle swelling, abdominal distention  Essential hypertension Blood  pressure is well controlled on today's visit. No changes made to the medications.  Hiatal hernia Reports GERD symptoms well-controlled on omeprazole 20 twice daily   Total encounter time more than 40 minutes  Greater than 50% was spent in counseling and coordination of care with the patient    Signed, Dossie Arbour, M.D., Ph.D. [facility] [place], [place] [phone]

## 2023-01-24 ENCOUNTER — Ambulatory Visit: Payer: Medicare HMO | Attending: Cardiovascular Disease | Admitting: Cardiovascular Disease

## 2023-01-24 ENCOUNTER — Encounter: Payer: Self-pay | Admitting: Cardiovascular Disease

## 2023-01-24 VITALS — BP 122/70 | HR 56 | Ht 63.0 in | Wt 214.2 lb

## 2023-01-24 DIAGNOSIS — I4819 Other persistent atrial fibrillation: Secondary | ICD-10-CM | POA: Diagnosis not present

## 2023-01-24 DIAGNOSIS — I5033 Acute on chronic diastolic (congestive) heart failure: Secondary | ICD-10-CM | POA: Diagnosis not present

## 2023-01-24 DIAGNOSIS — R0600 Dyspnea, unspecified: Secondary | ICD-10-CM

## 2023-01-24 DIAGNOSIS — I10 Essential (primary) hypertension: Secondary | ICD-10-CM | POA: Diagnosis not present

## 2023-01-24 DIAGNOSIS — Z79899 Other long term (current) drug therapy: Secondary | ICD-10-CM

## 2023-01-24 DIAGNOSIS — E7800 Pure hypercholesterolemia, unspecified: Secondary | ICD-10-CM

## 2023-01-24 MED ORDER — POTASSIUM CHLORIDE ER 10 MEQ PO CPCR: ORAL_CAPSULE | ORAL | 3 refills | Status: AC

## 2023-01-24 MED ORDER — TORSEMIDE 20 MG PO TABS: ORAL_TABLET | ORAL | 3 refills | Status: AC

## 2023-01-24 NOTE — Patient Instructions (Addendum)
Medication Instructions:  Please restart torsemide 20 mg with potassium 10 meq twice a week and as needed for shortness of breath  If you need a refill on your cardiac medications before your next appointment, please call your pharmacy.   Lab work: Your provider would like for you to return in 1 month to have the following labs drawn: BMP.   Please go to [facility] [address] ([facility] [address], [place] [address] You do not need an appointment.  They are open from 7:30 am-4 pm.  Lunch from 1:00 pm- 2:00 pm You will not need to be fasting.   You may also go to any of these [facility] locations:  [facility]  - [address] [employer] [address] Chief Technology Officer)      Testing/Procedures: No new testing needed  Follow-Up: At [facility], you and your health needs are our priority.  As part of our continuing mission to provide you with exceptional heart care, we have created designated Provider Care Teams.  These Care Teams include your primary Cardiologist (physician) and Advanced Practice Providers (APPs -  Physician Assistants and Nurse Practitioners) who all work together to provide you with the care you need, when you need it.  You will need a follow up appointment in 6 months  Providers on your designated Care Team:   Nicolasa Ducking, NP Eula Listen, PA-C Cadence Fransico Michael, [place]  COVID-19 Vaccine Information can be found at: [email or web address] For questions related to vaccine distribution or appointments, please email [email or web address] or call [phone].

## 2023-01-26 ENCOUNTER — Encounter: Payer: Self-pay | Admitting: Internal Medicine

## 2023-01-26 ENCOUNTER — Ambulatory Visit (INDEPENDENT_AMBULATORY_CARE_PROVIDER_SITE_OTHER): Payer: Medicare HMO | Admitting: Internal Medicine

## 2023-01-26 VITALS — BP 126/70 | HR 60 | Temp 97.9°F | Resp 16 | Ht 63.0 in | Wt 215.0 lb

## 2023-01-26 DIAGNOSIS — Z0000 Encounter for general adult medical examination without abnormal findings: Secondary | ICD-10-CM

## 2023-01-26 DIAGNOSIS — E7800 Pure hypercholesterolemia, unspecified: Secondary | ICD-10-CM

## 2023-01-26 DIAGNOSIS — Z23 Encounter for immunization: Secondary | ICD-10-CM | POA: Diagnosis not present

## 2023-01-26 DIAGNOSIS — I10 Essential (primary) hypertension: Secondary | ICD-10-CM

## 2023-01-26 DIAGNOSIS — D509 Iron deficiency anemia, unspecified: Secondary | ICD-10-CM

## 2023-01-26 DIAGNOSIS — I4819 Other persistent atrial fibrillation: Secondary | ICD-10-CM | POA: Diagnosis not present

## 2023-01-26 DIAGNOSIS — E039 Hypothyroidism, unspecified: Secondary | ICD-10-CM | POA: Diagnosis not present

## 2023-01-26 DIAGNOSIS — K449 Diaphragmatic hernia without obstruction or gangrene: Secondary | ICD-10-CM | POA: Diagnosis not present

## 2023-01-26 DIAGNOSIS — K219 Gastro-esophageal reflux disease without esophagitis: Secondary | ICD-10-CM

## 2023-01-26 DIAGNOSIS — G4733 Obstructive sleep apnea (adult) (pediatric): Secondary | ICD-10-CM

## 2023-01-26 DIAGNOSIS — R739 Hyperglycemia, unspecified: Secondary | ICD-10-CM | POA: Diagnosis not present

## 2023-01-26 DIAGNOSIS — K2100 Gastro-esophageal reflux disease with esophagitis, without bleeding: Secondary | ICD-10-CM

## 2023-01-26 LAB — LIPID PANEL
Cholesterol: 172 mg/dL (ref 0–200)
HDL: 65.8 mg/dL (ref >39.00)
LDL Cholesterol: 85 mg/dL (ref 0–99)
NonHDL: 106.25
Total CHOL/HDL Ratio: 3
Triglycerides: 105 mg/dL (ref 0.0–149.0)
VLDL: 21 mg/dL (ref 0.0–40.0)

## 2023-01-26 LAB — HEPATIC FUNCTION PANEL
ALT: 25 U/L (ref 0–35)
AST: 20 U/L (ref 0–37)
Albumin: 4.3 g/dL (ref 3.5–5.2)
Alkaline Phosphatase: 75 U/L (ref 39–117)
Bilirubin, Direct: 0.1 mg/dL (ref 0.0–0.3)
Total Bilirubin: 1 mg/dL (ref 0.2–1.2)
Total Protein: 6.6 g/dL (ref 6.0–8.3)

## 2023-01-26 LAB — BASIC METABOLIC PANEL
BUN: 26 mg/dL — ABNORMAL HIGH (ref 6–23)
CO2: 32 meq/L (ref 19–32)
Calcium: 10.2 mg/dL (ref 8.4–10.5)
Chloride: 100 meq/L (ref 96–112)
Creatinine, Ser: 1.45 mg/dL — ABNORMAL HIGH (ref 0.40–1.20)
GFR: 34.9 mL/min — ABNORMAL LOW (ref >60.00)
Glucose, Bld: 99 mg/dL (ref 70–99)
Potassium: 4.7 meq/L (ref 3.5–5.1)
Sodium: 139 meq/L (ref 135–145)

## 2023-01-26 LAB — HEMOGLOBIN A1C: Hgb A1c MFr Bld: 6.1 % (ref 4.6–6.5)

## 2023-01-26 NOTE — Assessment & Plan Note (Signed)
Moderate to severe OSA.  Seeing pulmonary.  Recommended CPAP (autoset 5-15).   

## 2023-01-26 NOTE — Assessment & Plan Note (Signed)
On crestor.  Low cholesterol diet and exercise.  Follow lipid panel and liver function tests.

## 2023-01-26 NOTE — Assessment & Plan Note (Signed)
Continue prilosec.  No upper symptoms.  Swallowing stable.

## 2023-01-26 NOTE — Assessment & Plan Note (Signed)
S/p venofer.  Follow cbc.   

## 2023-01-26 NOTE — Progress Notes (Signed)
Subjective:    Patient ID: Amanda Browning, female    DOB: 07/16/1945, 77 y.o.   MRN: [id number]  Patient here for  Chief Complaint  Patient presents with   Annual Exam    HPI Here for a physical exam.  Was just evaluated by cardiology 01/24/23.  Concern regarding wheezing and sob on exertion.  More sedentary.  Persistent afib - maintaining SR.  Recommended to continue eliquis, metoprolol and diltiazem. Regarding the sob, recommended to continue farxiga and restart torsemide twice a week. She started taking the torsemide.  Breathing better.  No increased sob currently.  Feeling better.  Walked this am. Discussed the need to monitor weight. F/u with urology 11/03/22 - urge incontinence/OAB. Recommended to continue gemtesa. No abdominal pain or bowel change reported.  Retired 06/2022.    Past Medical History:  Diagnosis Date   Anemia    Chicken pox    Colon polyps    Complication of anesthesia    doesn't take much anesthesia to put pt to sleep, slow to wake up   DDD (degenerative disc disease), cervical 02/05/2013   C5-C6 and C6-C7   GERD (gastroesophageal reflux disease)    Heart murmur    History of atrial fibrillation    History of chicken pox    Hypercholesterolemia    Hyperlipidemia    Hypertension    Hypothyroidism    Migraines    PONV (postoperative nausea and vomiting)    Sleep apnea    Squamous cell skin cancer    Past Surgical History:  Procedure Laterality Date   BREAST BIOPSY Right 2012   stereotactic   CARDIOVERSION N/A 04/29/2021   Procedure: CARDIOVERSION;  Surgeon: Antonieta Iba, MD;  Location: [facility] ORS;  Service: Cardiovascular;  Laterality: N/A;   CATARACT EXTRACTION W/PHACO Left 09/06/2021   Procedure: CATARACT EXTRACTION PHACO AND INTRAOCULAR LENS PLACEMENT (IOC) LEFT 6.60 00:43.8;  Surgeon: Nevada Crane, MD;  Location: [facility];  Service: Ophthalmology;  Laterality: Left;   CATARACT EXTRACTION W/PHACO Right 10/04/2021   Procedure:  CATARACT EXTRACTION PHACO AND INTRAOCULAR LENS PLACEMENT (IOC) RIGHT 6.08 00:45.0;  Surgeon: Nevada Crane, MD;  Location: [facility];  Service: Ophthalmology;  Laterality: Right;   COLONOSCOPY N/A 03/23/2022   Procedure: COLONOSCOPY;  Surgeon: Toledo, Boykin Nearing, MD;  Location: [facility] ENDOSCOPY;  Service: Gastroenterology;  Laterality: N/A;   COLONOSCOPY WITH PROPOFOL N/A 02/23/2018   Procedure: COLONOSCOPY WITH PROPOFOL;  Surgeon: Scot Jun, MD;  Location: [facility] ENDOSCOPY;  Service: Endoscopy;  Laterality: N/A;   ESOPHAGOGASTRODUODENOSCOPY N/A 03/15/2021   Procedure: ESOPHAGOGASTRODUODENOSCOPY (EGD);  Surgeon: Toney Reil, MD;  Location: [facility];  Service: Gastroenterology;  Laterality: N/A;   ESOPHAGOGASTRODUODENOSCOPY N/A 03/23/2022   Procedure: ESOPHAGOGASTRODUODENOSCOPY (EGD);  Surgeon: Toledo, Boykin Nearing, MD;  Location: [facility] ENDOSCOPY;  Service: Gastroenterology;  Laterality: N/A;   KNEE ARTHROPLASTY Right 04/17/2017   Procedure: COMPUTER ASSISTED TOTAL KNEE ARTHROPLASTY;  Surgeon: Donato Heinz, MD;  Location: [facility]S;  Service: Orthopedics;  Laterality: Right;   SKIN BIOPSY     VAGINAL HYSTERECTOMY  1994   ovaries not removed   WISDOM TOOTH EXTRACTION     Family History  Problem Relation Age of Onset   Heart disease Mother    Stroke Mother    Hypertension Mother    Heart attack Mother    Heart disease Father        enlarged heart   Stroke Father    Hypertension Father    Hyperlipidemia  Father    Heart attack Father    Ovarian cancer Paternal Aunt    Breast cancer Sister 49   Cancer Sister        Breast Cancer   Diabetes type II Brother    Hypertension Brother    Colon cancer Neg Hx    Basal cell carcinoma Neg Hx    Melanoma Neg Hx    Squamous cell carcinoma Neg Hx    Social History   Socioeconomic History   Marital status: Divorced    Spouse name: Not on file   Number of children: 2   Years of education: 12   Highest education  level: High school graduate  Occupational History   Not on file  Tobacco Use   Smoking status: Never    Passive exposure: Never   Smokeless tobacco: Never  Vaping Use   Vaping status: Never Used  Substance and Sexual Activity   Alcohol use: No    Alcohol/week: 0.0 standard drinks of alcohol   Drug use: No   Sexual activity: Never  Other Topics Concern   Not on file  Social History Narrative   Full Code   Alcohol - none   Never smoker and no smokeless tobacco   2 daughters   Social Determinants of Health   Financial Resource Strain: Low Risk  (04/19/2021)   Received from [facility]   Overall Financial Resource Strain (CARDIA)    Difficulty of Paying Living Expenses: Not very hard  Food Insecurity: No Food Insecurity (04/19/2021)   Received from [facility], [facility]   Hunger Vital Sign    Worried About Running Out of Food in the Last Year: Never true    Ran Out of Food in the Last Year: Never true  Transportation Needs: No Transportation Needs (04/19/2021)   Received from [facility]   PRAPARE - Transportation    Lack of Transportation (Medical): No    Lack of Transportation (Non-Medical): No  Physical Activity: Insufficiently Active (07/28/2017)   Exercise Vital Sign    Days of Exercise per Week: 4 days    Minutes of Exercise per Session: 30 min  Stress: No Stress Concern Present (07/28/2017)   [employer] of Occupational Health - Occupational Stress Questionnaire    Feeling of Stress : Not at all  Social Connections: Not on file     Review of Systems  Constitutional:  Negative for appetite change and unexpected weight change.  HENT:  Negative for congestion, sinus pressure and sore throat.   Eyes:  Negative for pain and visual disturbance.  Respiratory:  Negative for cough and chest tightness.        Breathing better.   Cardiovascular:  Negative for chest pain, palpitations and leg swelling.   Gastrointestinal:  Negative for abdominal pain, diarrhea, nausea and vomiting.  Genitourinary:  Negative for difficulty urinating and dysuria.  Musculoskeletal:  Negative for joint swelling and myalgias.  Skin:  Negative for color change and rash.  Neurological:  Negative for dizziness and headaches.  Hematological:  Negative for adenopathy. Does not bruise/bleed easily.  Psychiatric/Behavioral:  Negative for agitation and dysphoric mood.        Objective:     BP 126/70   Pulse 60   Temp 97.9 F (36.6 C)   Resp 16   Ht 5\' 3"  (1.6 m)   Wt 215 lb (97.5 kg)   SpO2 97%   BMI 38.09 kg/m  Wt Readings from Last 3 Encounters:  01/26/23 215 lb (97.5 kg)  01/24/23 214 lb 3.2 oz (97.2 kg)  11/03/22 202 lb (91.6 kg)    Physical Exam   Outpatient Encounter Medications as of 01/26/2023  Medication Sig   acetaminophen (TYLENOL) 325 MG tablet Take 650 mg by mouth 4 (four) times daily as needed.   apixaban (ELIQUIS) 5 MG TABS tablet TAKE 1 TABLET TWICE DAILY   cetirizine (ZYRTEC) 10 MG tablet Take 10 mg by mouth every evening.   dapagliflozin propanediol (FARXIGA) 10 MG TABS tablet Take 1 tablet (10 mg total) by mouth daily before breakfast.   diltiazem (CARDIZEM CD) 240 MG 24 hr capsule TAKE 1 CAPSULE EVERY DAY   ferrous sulfate 325 (65 FE) MG tablet Take 325 mg by mouth in the morning.   levothyroxine (SYNTHROID) 75 MCG tablet TAKE 1 TABLET EVERY DAY AT 6AM   losartan (COZAAR) 25 MG tablet TAKE 1/2 TABLET EVERY MORNING   magnesium oxide (MAG-OX) 400 MG tablet Take 1 tablet (400 mg total) by mouth 2 (two) times daily.   metoprolol tartrate (LOPRESSOR) 100 MG tablet TAKE 1 TABLET TWICE DAILY   Multiple Vitamin (MULTIVITAMIN WITH MINERALS) TABS tablet Take 1 tablet by mouth in the morning.   omeprazole (PRILOSEC) 20 MG capsule TAKE 1 CAPSULE TWICE DAILY BEFORE MEALS   potassium chloride (MICRO-K) 10 MEQ CR capsule Take 1 tablet (10 Meq) twice weekly with Torsemide. May take 1 extra  tablet weekly as needed with Torsemide.   rosuvastatin (CRESTOR) 20 MG tablet Take 1 tablet (20 mg total) by mouth daily.   sertraline (ZOLOFT) 50 MG tablet TAKE 1 TABLET DAILY   spironolactone (ALDACTONE) 25 MG tablet Take 1 tablet (25 mg total) by mouth daily.   torsemide (DEMADEX) 20 MG tablet Take Torsemide 1 tablet ( 20 MG) twice weekly. May take 1 extra tablet ( 20 MG ) once weekly as needed for shortness of breath.   Vibegron (GEMTESA) 75 MG TABS Take 1 tablet (75 mg total) by mouth daily.   Vibegron (GEMTESA) 75 MG TABS Take 1 tablet (75 mg total) by mouth daily.   No facility-administered encounter medications on file as of 01/26/2023.     Lab Results  Component Value Date   WBC 5.5 09/19/2022   HGB 13.1 09/19/2022   HCT 40.8 09/19/2022   PLT 214.0 09/19/2022   GLUCOSE 99 01/26/2023   CHOL 172 01/26/2023   TRIG 105.0 01/26/2023   HDL 65.80 01/26/2023   LDLDIRECT 131.0 03/19/2013   LDLCALC 85 01/26/2023   ALT 25 01/26/2023   AST 20 01/26/2023   NA 139 01/26/2023   K 4.7 01/26/2023   CL 100 01/26/2023   CREATININE 1.45 (H) 01/26/2023   BUN 26 (H) 01/26/2023   CO2 32 01/26/2023   TSH 1.75 05/19/2022   INR 1.2 03/12/2021   HGBA1C 6.1 01/26/2023    MM DIAG BREAST TOMO UNI RIGHT  Result Date: 05/04/2022 CLINICAL DATA:  77 year old female presenting as a recall from screening for possible right breast distortion and calcifications. EXAM: DIGITAL DIAGNOSTIC UNILATERAL RIGHT MAMMOGRAM WITH TOMOSYNTHESIS TECHNIQUE: Right digital diagnostic mammography and breast tomosynthesis was performed. COMPARISON:  Previous exam(s). ACR Breast Density Category b: There are scattered areas of fibroglandular density. FINDINGS: Spot compression tomosynthesis and spot 2D magnification views as well as full true lateral tomosynthesis views of the right breast were performed for a questioned area of distortion best seen on MLO view in the central breast as well as  for calcifications in the lower  inner breast. On the additional imaging the possible distortion resolves and is most consistent with normal overlapping tissue. The calcifications in the lower inner left breast have been stable dating back to at least 2021, consistent with a benign process. No suspicious linear or branching forms. IMPRESSION: 1. Resolution of the questioned distortion on screening mammogram in the right breast. 2. The calcifications in the lower inner right breast have been stable dating back to at least 2021 consistent with a benign process. RECOMMENDATION: Screening mammogram in one year.(Code:SM-B-01Y) I have discussed the findings and recommendations with the patient. If applicable, a reminder letter will be sent to the patient regarding the next appointment. BI-RADS CATEGORY  2: Benign. Electronically Signed   By: Emmaline Kluver M.D.   On: 05/04/2022 15:49      Assessment & Plan:  Routine general medical examination at a health care facility  Hypercholesterolemia Assessment & Plan: On crestor.  Low cholesterol diet and exercise.  Follow lipid panel and liver function tests.   Orders: -     Lipid panel -     Basic metabolic panel -     Hepatic function panel  Hyperglycemia Assessment & Plan: Low carb diet and exercise.  Follow met b and a1c.   Lab Results  Component Value Date   HGBA1C 6.1 01/26/2023    Orders: -     Hemoglobin A1c  Health care maintenance Assessment & Plan: Physical today 01/26/23. Mammogram 04/20/22 - Birads 0. F/u right breast mammogram 05/04/22 - Birads II. Recommend continue annual screening. Colonoscopy 03/23/22 - Diverticulosis in the sigmoid colon. Non-bleeding internal hemorrhoids. Stool in the transverse colon, in the ascending colon and in the cecum. Pathology - negative microscopic colitis. Repeat colonoscopy one year    Need for influenza vaccination -     Flu Vaccine Trivalent High Dose (Fluad)  Persistent atrial fibrillation (HCC) Assessment & Plan: On eliquis.   Continues on diltiazem and metoprolol.  Appears to be in SR today.    Gastroesophageal reflux disease, unspecified whether esophagitis present Assessment & Plan: Continue prilosec.  No upper symptoms.  Swallowing stable.    Hiatal hernia with GERD and esophagitis Assessment & Plan: Found to have large hiatal hernia and ulcer with no active bleeding.  Saw GI.  Recommended PPI bid. Swallowing stable.    Primary hypertension Assessment & Plan: Currently on cardizem and lopressor. Restart Torsemide as directed..  Blood pressure controlled.  Follow metabolic panel.    Hypothyroidism, unspecified type Assessment & Plan: On synthroid.  Follow tsh.    OSA (obstructive sleep apnea) Assessment & Plan: Moderate to severe OSA.  Seeing pulmonary.  Recommended CPAP (autoset 5-15).     Iron deficiency anemia, unspecified iron deficiency anemia type Assessment & Plan: S/p venofer.  Follow cbc.        Dale [place], MD

## 2023-01-26 NOTE — Assessment & Plan Note (Signed)
Physical today 01/26/23. Mammogram 04/20/22 - Birads 0. F/u right breast mammogram 05/04/22 - Birads II. Recommend continue annual screening. Colonoscopy 03/23/22 - Diverticulosis in the sigmoid colon. Non-bleeding internal hemorrhoids. Stool in the transverse colon, in the ascending colon and in the cecum. Pathology - negative microscopic colitis. Repeat colonoscopy one year

## 2023-01-26 NOTE — Assessment & Plan Note (Addendum)
On eliquis.  Continues on diltiazem and metoprolol.  Appears to be in SR today.

## 2023-01-26 NOTE — Assessment & Plan Note (Signed)
Found to have large hiatal hernia and ulcer with no active bleeding.  Saw GI.  Recommended PPI bid. Swallowing stable.  

## 2023-01-26 NOTE — Assessment & Plan Note (Signed)
Currently on cardizem and lopressor. Restart Torsemide as directed..  Blood pressure controlled.  Follow metabolic panel.

## 2023-01-26 NOTE — Assessment & Plan Note (Signed)
Low carb diet and exercise.  Follow met b and a1c.   Lab Results  Component Value Date   HGBA1C 6.1 01/26/2023

## 2023-01-26 NOTE — Assessment & Plan Note (Signed)
On synthroid.  Follow tsh.   

## 2023-02-01 ENCOUNTER — Telehealth: Payer: Self-pay

## 2023-02-01 DIAGNOSIS — R944 Abnormal results of kidney function studies: Secondary | ICD-10-CM

## 2023-02-01 NOTE — Telephone Encounter (Signed)
Pt needs a non fasting lab appt BMP only in 2 weeks when she returns call. Lab ordered.

## 2023-02-13 ENCOUNTER — Telehealth: Payer: Self-pay | Admitting: Pharmacist

## 2023-02-13 ENCOUNTER — Encounter: Payer: Self-pay | Admitting: Pharmacist

## 2023-02-13 NOTE — Telephone Encounter (Signed)
Received voicemail from patient wanting to discus re-enrollment. Will send to Lillia Abed to outreach.

## 2023-02-15 ENCOUNTER — Encounter: Payer: Self-pay | Admitting: Pharmacist

## 2023-02-15 NOTE — Progress Notes (Signed)
PCP office received voicemail from patient wanting to discus re-enrollment. My Chart message sent to patient on 02/08/23 regarding MAP Re-enrollment: Has not been opened at this time.   Hello [name],    I am the clinical pharmacist at Dale Elmo, MD's office. We received a message regarding assistance with re-enrollment in medication assistance for 2025.    It looks like you were approved for Medicare Extra Help early in the year, so I do not believe that you are receiving medications from any manufacturer programs, though please do correct me if this is not accurate.    Regarding Medicare Extra Help program, according to [facility], you should not have to re-enroll in this once you are approve. Medicare automatically re-enrolls people in Extra Help if they meet the income and resource limits for the following year. People who qualify for Extra Help automatically will receive a purple letter in the mail. If you don't receive a notice, you'll keep your Extra Help and drug plan for the following year.   If you received any other notice, or received a letter stating that you no longer qualify for Extra Help, please let us know.    Please let me know if you have additional questions.  You may reply directly to this message, or you may give me a call on my personal office line: [phone]   Called patient today and spoke with daughter who is already aware that re-enrollment is not needed. They will reach out if any further questions/concerns arise.   Amanda Browning, PharmD Clinical Pharmacist [facility] [phone]

## 2023-02-23 ENCOUNTER — Encounter: Payer: Self-pay | Admitting: Emergency Medicine

## 2023-02-23 ENCOUNTER — Other Ambulatory Visit: Payer: Self-pay | Admitting: Internal Medicine

## 2023-03-06 ENCOUNTER — Telehealth: Payer: Self-pay | Admitting: *Deleted

## 2023-03-06 NOTE — Telephone Encounter (Signed)
Called to request patient come in for labs   From Jani Gravel, RN To Patia, [place] and Delivered 02/20/2023  8:06 AM     Good Morning,   Dr. Mariah Milling would like for you to return in 1 month from your appointment on 01/24/23 to have the following labs drawn: BMP.    Please go to [facility] [address] ([facility] [address], [place] [address] You do not need an appointment.  They are open from 7:30 am-4 pm.  Lunch from 1:00 pm- 2:00 pm You will not need to be fasting.

## 2023-03-06 NOTE — Telephone Encounter (Signed)
Left message with gentleman who answered the phone and requested that patient please give [place] a call back.

## 2023-03-07 DIAGNOSIS — M9903 Segmental and somatic dysfunction of lumbar region: Secondary | ICD-10-CM | POA: Diagnosis not present

## 2023-03-07 DIAGNOSIS — M9904 Segmental and somatic dysfunction of sacral region: Secondary | ICD-10-CM | POA: Diagnosis not present

## 2023-03-07 DIAGNOSIS — M5441 Lumbago with sciatica, right side: Secondary | ICD-10-CM | POA: Diagnosis not present

## 2023-03-07 DIAGNOSIS — Z79899 Other long term (current) drug therapy: Secondary | ICD-10-CM | POA: Diagnosis not present

## 2023-03-07 DIAGNOSIS — M461 Sacroiliitis, not elsewhere classified: Secondary | ICD-10-CM | POA: Diagnosis not present

## 2023-03-08 LAB — BASIC METABOLIC PANEL
BUN/Creatinine Ratio: 15 (ref 12–28)
BUN: 21 mg/dL (ref 8–27)
CO2: 26 mmol/L (ref 20–29)
Calcium: 9.9 mg/dL (ref 8.7–10.3)
Chloride: 101 mmol/L (ref 96–106)
Creatinine, Ser: 1.37 mg/dL — ABNORMAL HIGH (ref 0.57–1.00)
Glucose: 60 mg/dL — ABNORMAL LOW (ref 70–99)
Potassium: 5.2 mmol/L (ref 3.5–5.2)
Sodium: 143 mmol/L (ref 134–144)
eGFR: 40 mL/min/{1.73_m2} — ABNORMAL LOW (ref >59)

## 2023-03-09 NOTE — Telephone Encounter (Signed)
Patient had labs done and no further needs.

## 2023-03-13 ENCOUNTER — Encounter: Payer: Self-pay | Admitting: Emergency Medicine

## 2023-03-27 ENCOUNTER — Telehealth: Payer: Self-pay

## 2023-03-27 MED ORDER — SERTRALINE HCL 50 MG PO TABS: ORAL_TABLET | ORAL | 1 refills | Status: DC

## 2023-03-27 NOTE — Telephone Encounter (Signed)
Prescription Request  03/27/2023  LOV: Visit date not found  What is the name of the medication or equipment? sertraline (ZOLOFT) 50 MG tablet  Have you contacted your pharmacy to request a refill? No   Which pharmacy would you like this sent to?  [facility] - [place], [place] - [address] [place] [address] Phone: [phone] Fax: [phone]    Patient notified that their request is being sent to the clinical staff for review and that they should receive a response within 2 business days.   Please advise at Mobile [phone] (mobile)  Patient states she has enough of this medication left for five days.  Patient states her dose changed back from a half pill to a whole pill, so she has run out.

## 2023-03-27 NOTE — Telephone Encounter (Signed)
Rx refilled. Daughter aware

## 2023-04-03 ENCOUNTER — Other Ambulatory Visit: Payer: Self-pay | Admitting: Internal Medicine

## 2023-04-10 ENCOUNTER — Other Ambulatory Visit: Payer: Self-pay | Admitting: Internal Medicine

## 2023-04-24 ENCOUNTER — Other Ambulatory Visit: Payer: Self-pay | Admitting: Internal Medicine

## 2023-05-26 ENCOUNTER — Other Ambulatory Visit: Payer: Self-pay | Admitting: Urology

## 2023-05-26 ENCOUNTER — Other Ambulatory Visit: Payer: Self-pay | Admitting: Internal Medicine

## 2023-05-26 ENCOUNTER — Other Ambulatory Visit: Payer: Self-pay | Admitting: Cardiovascular Disease

## 2023-05-26 DIAGNOSIS — I4819 Other persistent atrial fibrillation: Secondary | ICD-10-CM

## 2023-05-26 NOTE — Telephone Encounter (Signed)
 Prescription refill request for Eliquis  received. Indication:afib Last office visit:10/24 Scr:1.37  11/24 Age: 78 Weight:97.5  kg  Prescription refilled

## 2023-06-09 ENCOUNTER — Other Ambulatory Visit: Payer: Self-pay | Admitting: Internal Medicine

## 2023-06-19 ENCOUNTER — Other Ambulatory Visit: Payer: Self-pay | Admitting: Internal Medicine

## 2023-06-21 DIAGNOSIS — D485 Neoplasm of uncertain behavior of skin: Secondary | ICD-10-CM | POA: Diagnosis not present

## 2023-06-21 DIAGNOSIS — L821 Other seborrheic keratosis: Secondary | ICD-10-CM | POA: Diagnosis not present

## 2023-06-21 DIAGNOSIS — L570 Actinic keratosis: Secondary | ICD-10-CM | POA: Diagnosis not present

## 2023-06-21 DIAGNOSIS — Z85828 Personal history of other malignant neoplasm of skin: Secondary | ICD-10-CM | POA: Diagnosis not present

## 2023-06-21 DIAGNOSIS — Z872 Personal history of diseases of the skin and subcutaneous tissue: Secondary | ICD-10-CM | POA: Diagnosis not present

## 2023-06-21 DIAGNOSIS — L820 Inflamed seborrheic keratosis: Secondary | ICD-10-CM | POA: Diagnosis not present

## 2023-06-21 DIAGNOSIS — Z08 Encounter for follow-up examination after completed treatment for malignant neoplasm: Secondary | ICD-10-CM | POA: Diagnosis not present

## 2023-06-22 ENCOUNTER — Other Ambulatory Visit: Payer: Self-pay | Admitting: Internal Medicine

## 2023-06-22 DIAGNOSIS — Z1231 Encounter for screening mammogram for malignant neoplasm of breast: Secondary | ICD-10-CM

## 2023-07-06 ENCOUNTER — Ambulatory Visit
Admission: RE | Admit: 2023-07-06 | Discharge: 2023-07-06 | Disposition: A | Discharge status: Home / Self Care | Source: Ambulatory Visit | Attending: Internal Medicine | Admitting: Internal Medicine

## 2023-07-06 DIAGNOSIS — Z1231 Encounter for screening mammogram for malignant neoplasm of breast: Secondary | ICD-10-CM | POA: Insufficient documentation

## 2023-07-27 ENCOUNTER — Ambulatory Visit: Attending: Medical | Admitting: Medical

## 2023-07-27 ENCOUNTER — Encounter: Payer: Self-pay | Admitting: Medical

## 2023-07-27 VITALS — BP 120/70 | HR 57 | Ht 63.0 in | Wt 217.8 lb

## 2023-07-27 DIAGNOSIS — Z79899 Other long term (current) drug therapy: Secondary | ICD-10-CM

## 2023-07-27 DIAGNOSIS — I4819 Other persistent atrial fibrillation: Secondary | ICD-10-CM | POA: Diagnosis not present

## 2023-07-27 DIAGNOSIS — I5032 Chronic diastolic (congestive) heart failure: Secondary | ICD-10-CM | POA: Diagnosis not present

## 2023-07-27 DIAGNOSIS — I10 Essential (primary) hypertension: Secondary | ICD-10-CM | POA: Diagnosis not present

## 2023-07-27 NOTE — Progress Notes (Signed)
 Cardiology Office Note:  .   Date:  07/27/2023  ID:  Amanda, Browning [birth year], MRN [id number] PCP: Amanda [place], MD  [facility] Providers Cardiologist:  None     History of Present Illness: .   Amanda Browning is a 78 y.o. female with a history of hypertension, hyperlipidemia, history of GI bleed in 2022, A-fib RVR, acute diastolic CHF, history of PE who presents for 78-month follow-up.  Echo in November 2022 showed EF 55 to 60%, mild LVH, normal RV SF, mild MR.  Patient was admitted to [facility] for acute heart failure in 2023.  Echo showed EF greater than 55%, moderate TR, reduced systolic function.  Patient has a history of A-fib with prior cardioversion in January 2023.  Patient is on Eliquis 5 mg twice daily.  Patient was last seen in October 2024 and was in normal sinus rhythm.  Today, the patient reports she is overall doing well. She has chronic SOB that is unchanged. She denies chest pain, lower leg edema. She takes lasix as needed. She denies heart racing or palpitations. She says diet could be better.    Studies Reviewed: Marland Kitchen   EKG Interpretation Date/Time:  Thursday July 27 2023 11:25:06 EDT Ventricular Rate:  57 PR Interval:  232 QRS Duration:  78 QT Interval:  444 QTC Calculation: 432 R Axis:   19  Text Interpretation: Sinus bradycardia with 1st degree A-V block Low voltage QRS Cannot rule out Anterior infarct , age undetermined When compared with ECG of 24-Jan-2023 10:46, No significant change was found Confirmed by Fransico Michael, Zephyr Sausedo ([id number]) on 07/27/2023 11:30:39 AM    Echo 2022 1. Left ventricular ejection fraction, by estimation, is 55 to 60%. The  left ventricle has normal function. The left ventricle has no regional  wall motion abnormalities. There is mild left ventricular hypertrophy.  Left ventricular diastolic parameters  are indeterminate.   2. Right ventricular systolic function is normal. The right ventricular  size is normal. There is  moderately elevated pulmonary artery systolic  pressure. The estimated right ventricular systolic pressure is 46.5 mmHg.   3. Left atrial size was moderately dilated.   4. Right atrial size was moderately dilated.   5. The mitral valve is normal in structure. Mild mitral valve  regurgitation. No evidence of mitral stenosis.   6. Tricuspid valve regurgitation is moderate.   7. The aortic valve is normal in structure. Aortic valve regurgitation is  not visualized. No aortic stenosis is present.        Physical Exam:   VS:  BP 120/70 (BP Location: Left Arm, Patient Position: Sitting, Cuff Size: Large)   Pulse (!) 57   Ht 5\' 3"  (1.6 m)   Wt 217 lb 12.8 oz (98.8 kg)   SpO2 97%   BMI 38.58 kg/m    Wt Readings from Last 3 Encounters:  07/27/23 217 lb 12.8 oz (98.8 kg)  01/26/23 215 lb (97.5 kg)  01/24/23 214 lb 3.2 oz (97.2 kg)    GEN: Well nourished, well developed in no acute distress NECK: No JVD; No carotid bruits CARDIAC: RRR, no murmurs, rubs, gallops RESPIRATORY:  Clear to auscultation without rales, wheezing or rhonchi  ABDOMEN: Soft, non-tender, non-distended EXTREMITIES:  No edema; No deformity   ASSESSMENT AND PLAN: .    Persistent Afib The patient is in sinus bradycardia on EKG. Continue Diltiazem 240mg  daily and Lopressor 100mg  BID for rate control. Continue Eliquis 5mg  BID for stroke ppx.   Chronic diastolic  CHF Echo in 2022 showed LVEF 55-60%, no WMA, mild LVH, mild MR. The patient is euvolemic on exam. She takes Torsemide twice weekly for swelling. Continue Losartan 12.5mg  daily, Lopressor 100mg  BID, spironolactone 25mg  daily, Farxiga 10mg  daily, Torsemide 20mg  PRN swelling.   HTN BP is normal today, continue current medications.   Dispo: Follow-up in 6 months  Signed, Natavia Sublette David Stall, PA-C

## 2023-07-27 NOTE — Patient Instructions (Signed)
 Medication Instructions:  NO CHANGES  *If you need a refill on your cardiac medications before your next appointment, please call your pharmacy*  Lab Work: BMET today   If you have labs (blood work) drawn today and your tests are completely normal, you will receive your results only by: MyChart Message (if you have MyChart) OR A paper copy in the mail If you have any lab test that is abnormal or we need to change your treatment, we will call you to review the results.   Follow-Up: At [facility], you and your health needs are our priority.  As part of our continuing mission to provide you with exceptional heart care, our providers are all part of one team.  This team includes your primary Cardiologist (physician) and Advanced Practice Providers or APPs (Physician Assistants and Nurse Practitioners) who all work together to provide you with the care you need, when you need it.  Your next appointment:   6 months with Dr. Mariah Milling

## 2023-07-28 LAB — BASIC METABOLIC PANEL WITH GFR
BUN/Creatinine Ratio: 14 (ref 12–28)
BUN: 24 mg/dL (ref 8–27)
CO2: 25 mmol/L (ref 20–29)
Calcium: 9.8 mg/dL (ref 8.7–10.3)
Chloride: 100 mmol/L (ref 96–106)
Creatinine, Ser: 1.75 mg/dL — ABNORMAL HIGH (ref 0.57–1.00)
Glucose: 87 mg/dL (ref 70–99)
Potassium: 5.3 mmol/L — ABNORMAL HIGH (ref 3.5–5.2)
Sodium: 143 mmol/L (ref 134–144)
eGFR: 30 mL/min/{1.73_m2} — ABNORMAL LOW (ref >59)

## 2023-08-10 DIAGNOSIS — Z860101 Personal history of adenomatous and serrated colon polyps: Secondary | ICD-10-CM | POA: Diagnosis not present

## 2023-08-10 DIAGNOSIS — K449 Diaphragmatic hernia without obstruction or gangrene: Secondary | ICD-10-CM | POA: Diagnosis not present

## 2023-08-10 DIAGNOSIS — K529 Noninfective gastroenteritis and colitis, unspecified: Secondary | ICD-10-CM | POA: Diagnosis not present

## 2023-08-10 DIAGNOSIS — K219 Gastro-esophageal reflux disease without esophagitis: Secondary | ICD-10-CM | POA: Diagnosis not present

## 2023-08-15 ENCOUNTER — Encounter: Payer: Self-pay | Admitting: Nurse Practitioner

## 2023-08-15 ENCOUNTER — Ambulatory Visit (INDEPENDENT_AMBULATORY_CARE_PROVIDER_SITE_OTHER): Admitting: Nurse Practitioner

## 2023-08-15 ENCOUNTER — Telehealth: Payer: Self-pay

## 2023-08-15 VITALS — BP 136/90 | HR 56 | Temp 98.0°F | Ht 63.0 in | Wt 217.2 lb

## 2023-08-15 DIAGNOSIS — R062 Wheezing: Secondary | ICD-10-CM | POA: Insufficient documentation

## 2023-08-15 DIAGNOSIS — J40 Bronchitis, not specified as acute or chronic: Secondary | ICD-10-CM | POA: Diagnosis not present

## 2023-08-15 MED ORDER — AMOXICILLIN-POT CLAVULANATE 875-125 MG PO TABS: 1.0000 | ORAL_TABLET | Freq: Two times a day (BID) | ORAL | 0 refills | Status: DC

## 2023-08-15 MED ORDER — IPRATROPIUM-ALBUTEROL 0.5-2.5 (3) MG/3ML IN SOLN
3.0000 mL | Freq: Once | RESPIRATORY_TRACT | Status: AC
  Administered 2023-08-15: 3 mL via RESPIRATORY_TRACT

## 2023-08-15 MED ORDER — BENZONATATE 100 MG PO CAPS: 100.0000 mg | ORAL_CAPSULE | Freq: Three times a day (TID) | ORAL | 0 refills | Status: DC | PRN

## 2023-08-15 MED ORDER — PREDNISONE 20 MG PO TABS: 40.0000 mg | ORAL_TABLET | Freq: Every day | ORAL | 0 refills | Status: AC

## 2023-08-15 NOTE — Assessment & Plan Note (Signed)
-  Will start on prednisone  and Augmentin. -Chest X-ray pending.

## 2023-08-15 NOTE — Progress Notes (Signed)
 Established Patient Office Visit  Subjective:  Patient ID: Amanda Browning, female    DOB: 1945/10/23  Age: 78 y.o. MRN: [id number]  CC:  Chief Complaint  Patient presents with   Cough    Patient says she was out working in her yard a couple of weeks ago. Patient says she has been had a real bad cough since then. Patient says she has tried Tussin DM for cough, Cold and Flu, and old prescriptions of Tessalon Pearles, but nothing seems to help, as it only helped a little bit.    Discussed the use of a AI scribe software for clinical note transcription with the patient, who gave verbal consent to proceed.  HPI Amanda Browning is a 78 year old female who presents with a persistent cough and congestion.  She has been experiencing a persistent cough for about two weeks, which is worse during the daytime and sometimes productive of greenish phlegm. No fever is present, but the cough has led to back pain.   She has chronic SOB states no acute change. No fatigue, fever, ear pain, or sinus pressure.   She has been using nasal spray and left over tessalon perls.     Past Medical History:  Diagnosis Date   Anemia    Chicken pox    Colon polyps    Complication of anesthesia    doesn't take much anesthesia to put pt to sleep, slow to wake up   DDD (degenerative disc disease), cervical 02/05/2013   C5-C6 and C6-C7   GERD (gastroesophageal reflux disease)    Heart murmur    History of atrial fibrillation    History of chicken pox    Hypercholesterolemia    Hyperlipidemia    Hypertension    Hypothyroidism    Migraines    PONV (postoperative nausea and vomiting)    Sleep apnea    Squamous cell skin cancer     Past Surgical History:  Procedure Laterality Date   BREAST BIOPSY Right 2012   stereotactic   CARDIOVERSION N/A 04/29/2021   Procedure: CARDIOVERSION;  Surgeon: Devorah Fonder, MD;  Location: [facility] ORS;  Service: Cardiovascular;  Laterality: N/A;   CATARACT EXTRACTION  W/PHACO Left 09/06/2021   Procedure: CATARACT EXTRACTION PHACO AND INTRAOCULAR LENS PLACEMENT (IOC) LEFT 6.60 00:43.8;  Surgeon: Rosa College, MD;  Location: [facility];  Service: Ophthalmology;  Laterality: Left;   CATARACT EXTRACTION W/PHACO Right 10/04/2021   Procedure: CATARACT EXTRACTION PHACO AND INTRAOCULAR LENS PLACEMENT (IOC) RIGHT 6.08 00:45.0;  Surgeon: Rosa College, MD;  Location: [facility];  Service: Ophthalmology;  Laterality: Right;   COLONOSCOPY N/A 03/23/2022   Procedure: COLONOSCOPY;  Surgeon: Toledo, Alphonsus Jeans, MD;  Location: [facility] ENDOSCOPY;  Service: Gastroenterology;  Laterality: N/A;   COLONOSCOPY WITH PROPOFOL  N/A 02/23/2018   Procedure: COLONOSCOPY WITH PROPOFOL ;  Surgeon: Cassie Click, MD;  Location: [facility] ENDOSCOPY;  Service: Endoscopy;  Laterality: N/A;   ESOPHAGOGASTRODUODENOSCOPY N/A 03/15/2021   Procedure: ESOPHAGOGASTRODUODENOSCOPY (EGD);  Surgeon: Selena Daily, MD;  Location: [facility] ENDOSCOPY;  Service: Gastroenterology;  Laterality: N/A;   ESOPHAGOGASTRODUODENOSCOPY N/A 03/23/2022   Procedure: ESOPHAGOGASTRODUODENOSCOPY (EGD);  Surgeon: Toledo, Alphonsus Jeans, MD;  Location: [facility] ENDOSCOPY;  Service: Gastroenterology;  Laterality: N/A;   KNEE ARTHROPLASTY Right 04/17/2017   Procedure: COMPUTER ASSISTED TOTAL KNEE ARTHROPLASTY;  Surgeon: Arlyne Lame, MD;  Location: [facility] ORS;  Service: Orthopedics;  Laterality: Right;   SKIN BIOPSY     VAGINAL HYSTERECTOMY  1994  ovaries not removed   WISDOM TOOTH EXTRACTION      Family History  Problem Relation Age of Onset   Heart disease Mother    Stroke Mother    Hypertension Mother    Heart attack Mother    Heart disease Father        enlarged heart   Stroke Father    Hypertension Father    Hyperlipidemia Father    Heart attack Father    Ovarian cancer Paternal Aunt    Breast cancer Sister 37   Cancer Sister        Breast Cancer   Diabetes type II Brother    Hypertension  Brother    Colon cancer Neg Hx    Basal cell carcinoma Neg Hx    Melanoma Neg Hx    Squamous cell carcinoma Neg Hx     Social History   Socioeconomic History   Marital status: Divorced    Spouse name: Not on file   Number of children: 2   Years of education: 12   Highest education level: High school graduate  Occupational History   Not on file  Tobacco Use   Smoking status: Never    Passive exposure: Never   Smokeless tobacco: Never  Vaping Use   Vaping status: Never Used  Substance and Sexual Activity   Alcohol use: No    Alcohol/week: 0.0 standard drinks of alcohol   Drug use: No   Sexual activity: Never  Other Topics Concern   Not on file  Social History Narrative   Full Code   Alcohol - none   Never smoker and no smokeless tobacco   2 daughters   Social Drivers of Corporate investment banker Strain: Low Risk  (04/19/2021)   Received from [facility], [facility]   Overall Financial Resource Strain (CARDIA)    Difficulty of Paying Living Expenses: Not very hard  Food Insecurity: No Food Insecurity (04/19/2021)   Received from [facility], [facility]   Hunger Vital Sign    Worried About Running Out of Food in the Last Year: Never true    Ran Out of Food in the Last Year: Never true  Transportation Needs: No Transportation Needs (04/19/2021)   [facility] from [facility], [facility]   PRAPARE - Transportation    Lack of Transportation (Medical): No    Lack of Transportation (Non-Medical): No  Physical Activity: Insufficiently Active (07/28/2017)   Exercise Vital Sign    Days of Exercise per Week: 4 days    Minutes of Exercise per Session: 30 min  Stress: No Stress Concern Present (07/28/2017)   [employer] of Occupational Health - Occupational Stress Questionnaire    Feeling of Stress : Not at all  Social Connections: Not on file  Intimate Partner Violence: Not on file     Outpatient Medications Prior to Visit  Medication Sig  Dispense Refill   acetaminophen  (TYLENOL ) 325 MG tablet Take 650 mg by mouth 4 (four) times daily as needed.     cetirizine  (ZYRTEC ) 10 MG tablet Take 10 mg by mouth every evening.     diltiazem  (CARDIZEM  CD) 240 MG 24 hr capsule TAKE 1 CAPSULE EVERY DAY 90 capsule 1   ELIQUIS  5 MG TABS tablet TAKE 1 TABLET TWICE DAILY 180 tablet 3   FARXIGA  10 MG TABS tablet TAKE 1 TABLET EVERY DAY BEFORE BREAKFAST 90 tablet 3   ferrous sulfate  325 (65 FE) MG tablet  Take 325 mg by mouth in the morning.     levothyroxine  (SYNTHROID ) 75 MCG tablet TAKE 1 TABLET EVERY DAY AT 6AM 90 tablet 3   losartan  (COZAAR ) 25 MG tablet TAKE 1/2 TABLET EVERY MORNING 45 tablet 3   magnesium  oxide (MAG-OX) 400 MG tablet Take 1 tablet (400 mg total) by mouth 2 (two) times daily. 60 tablet 2   metoprolol  tartrate (LOPRESSOR ) 100 MG tablet TAKE 1 TABLET TWICE DAILY 180 tablet 3   Multiple Vitamin (MULTIVITAMIN WITH MINERALS) TABS tablet Take 1 tablet by mouth in the morning.     omeprazole  (PRILOSEC) 40 MG capsule Take 40 mg by mouth.     potassium chloride  (MICRO-K ) 10 MEQ CR capsule Take 1 tablet (10 Meq) twice weekly with Torsemide . May take 1 extra tablet weekly as needed with Torsemide . 36 capsule 3   rosuvastatin  (CRESTOR ) 20 MG tablet TAKE 1 TABLET EVERY DAY 90 tablet 3   sertraline  (ZOLOFT ) 50 MG tablet TAKE 1 TABLET DAILY 90 tablet 1   spironolactone  (ALDACTONE ) 25 MG tablet TAKE 1 TABLET EVERY DAY 90 tablet 1   torsemide  (DEMADEX ) 20 MG tablet Take Torsemide  1 tablet ( 20 MG) twice weekly. May take 1 extra tablet ( 20 MG ) once weekly as needed for shortness of breath. 36 tablet 3   Vibegron  (GEMTESA ) 75 MG TABS Take 1 tablet (75 mg total) by mouth daily. 30 tablet 3   Vibegron  (GEMTESA ) 75 MG TABS Take 1 tablet (75 mg total) by mouth daily. 90 tablet 3   Vibegron  (GEMTESA ) 75 MG TABS TAKE 1 TABLET EVERY DAY 90 tablet 2   omeprazole  (PRILOSEC) 20 MG capsule TAKE 1 CAPSULE TWICE DAILY BEFORE MEALS 180 capsule 3   No  facility-administered medications prior to visit.    Allergies  Allergen Reactions   Codeine Other (See Comments)    Altered mental status- "Cannot function"    ROS Review of Systems  Respiratory:  Positive for cough.    Negative unless indicated in HPI.    Objective:    Physical Exam Constitutional:      Appearance: Normal appearance.  HENT:     Right Ear: Tympanic membrane normal. Tympanic membrane is not erythematous.     Left Ear: Tympanic membrane normal. Tympanic membrane is not erythematous.     Nose:     Right Turbinates: Not enlarged.     Left Turbinates: Not enlarged.     Right Sinus: No maxillary sinus tenderness or frontal sinus tenderness.     Left Sinus: No maxillary sinus tenderness or frontal sinus tenderness.     Mouth/Throat:     Mouth: Mucous membranes are moist.     Pharynx: Oropharynx is clear. Postnasal drip present.  Cardiovascular:     Rate and Rhythm: Normal rate and regular rhythm.  Pulmonary:     Effort: Pulmonary effort is normal.     Breath sounds: No stridor. Wheezing present.  Neurological:     General: No focal deficit present.     Mental Status: She is alert and oriented to person, place, and time. Mental status is at baseline.  Psychiatric:        Mood and Affect: Mood normal.        Behavior: Behavior normal.        Thought Content: Thought content normal.        Judgment: Judgment normal.     BP (!) 136/90   Pulse (!) 56   Temp 98 F (36.7 C) (  Oral)   Ht 5\' 3"  (1.6 m)   Wt 217 lb 3.2 oz (98.5 kg)   SpO2 94%   BMI 38.48 kg/m  Wt Readings from Last 3 Encounters:  08/15/23 217 lb 3.2 oz (98.5 kg)  07/27/23 217 lb 12.8 oz (98.8 kg)  01/26/23 215 lb (97.5 kg)     Health Maintenance  Topic Date Due   COVID-19 Vaccine (1) Never done   DTaP/Tdap/Td (1 - Tdap) Never done   Zoster Vaccines- Shingrix (1 of 2) Never done   Medicare Annual Wellness (AWV)  07/29/2018   INFLUENZA VACCINE  11/17/2023   MAMMOGRAM  07/05/2024    Pneumonia Vaccine 19+ Years old  Completed   DEXA SCAN  Completed   Hepatitis C Screening  Completed   HPV VACCINES  Aged Out   Meningococcal B Vaccine  Aged Out   Colonoscopy  Discontinued    There are no preventive care reminders to display for this patient.  Lab Results  Component Value Date   TSH 1.75 05/19/2022   Lab Results  Component Value Date   WBC 5.5 09/19/2022   HGB 13.1 09/19/2022   HCT 40.8 09/19/2022   MCV 102.3 (H) 09/19/2022   PLT 214.0 09/19/2022   Lab Results  Component Value Date   NA 143 07/27/2023   K 5.3 (H) 07/27/2023   CO2 25 07/27/2023   GLUCOSE 87 07/27/2023   BUN 24 07/27/2023   CREATININE 1.75 (H) 07/27/2023   BILITOT 1.0 01/26/2023   ALKPHOS 75 01/26/2023   AST 20 01/26/2023   ALT 25 01/26/2023   PROT 6.6 01/26/2023   ALBUMIN 4.3 01/26/2023   CALCIUM  9.8 07/27/2023   ANIONGAP 7 03/19/2021   EGFR 30 (L) 07/27/2023   GFR 34.90 (L) 01/26/2023   Lab Results  Component Value Date   CHOL 172 01/26/2023   Lab Results  Component Value Date   HDL 65.80 01/26/2023   Lab Results  Component Value Date   LDLCALC 85 01/26/2023   Lab Results  Component Value Date   TRIG 105.0 01/26/2023   Lab Results  Component Value Date   CHOLHDL 3 01/26/2023   Lab Results  Component Value Date   HGBA1C 6.1 01/26/2023      Assessment & Plan:  Bronchitis Assessment & Plan: Cough with wheezing likely due to respiratory infection exacerbated by allergies. Duoneb administer in the office. Saturation 95%, with walking saturation remain stable at 95%. -Will treat with Augmentin, prednisone  and tesslon perles.  -Continue zyrtec . - Recommend OTC plain Mucinex tablets for congestion. -Chest X-ray pending. -She will let us  posted about the symptoms.   Wheezing Assessment & Plan: -Will start on prednisone  and Augmentin. -Chest X-ray pending.    Orders: -     Ipratropium-Albuterol  -     DG Chest 2 View  Other orders -     Amoxicillin-Pot  Clavulanate; Take 1 tablet by mouth 2 (two) times daily.  Dispense: 20 tablet; Refill: 0 -     predniSONE ; Take 2 tablets (40 mg total) by mouth daily with breakfast for 5 days.  Dispense: 10 tablet; Refill: 0 -     Benzonatate; Take 1 capsule (100 mg total) by mouth 3 (three) times daily as needed.  Dispense: 30 capsule; Refill: 0    Follow-up: No follow-ups on file.   Myrth Dahan, NP

## 2023-08-15 NOTE — Telephone Encounter (Unsigned)
 Copied from CRM [id number]. Topic: General - Other >> Aug 15, 2023 10:11 AM Felizardo Hotter wrote: Reason for CRM: Pt called needs something sent in to pharmacy for cough [facility] pharmacy [address] [place], [place], [place] [address] [phone]. Please call pt when sent.

## 2023-08-15 NOTE — Patient Instructions (Signed)
 Start prednisone  once a day and  Augmentin twice a day, take probiotics to reduce GI side effect. Take plain Mucinex and continue zyrtec . Incresae fluid intake and rest. Use steam and humidifier.

## 2023-08-15 NOTE — Assessment & Plan Note (Addendum)
 Cough with wheezing likely due to respiratory infection exacerbated by allergies. Duoneb administer in the office. Saturation 95%, with walking saturation remain stable at 95%. -Will treat with Augmentin, prednisone  and tesslon perles.  -Continue zyrtec . - Recommend OTC plain Mucinex tablets for congestion. -Chest X-ray pending. -She will let us  posted about the symptoms.

## 2023-08-15 NOTE — Telephone Encounter (Signed)
 Called patient. She has been having cough and congestion for several days. Daughter thinks she has bronchitis. Offered appt with Dr Geralyn Knee tomorrow but pt would rather be seen today. Scheduled acute visit with NP. Confirmed no acute sob, fever, chills, etc

## 2023-08-15 NOTE — Telephone Encounter (Signed)
 Copied from CRM [id number]. Topic: General - Other >> Aug 15, 2023 10:11 AM Felizardo Hotter wrote: Reason for CRM: Pt called needs something sent in to pharmacy for cough [facility] pharmacy [address] [place], [place], [place] [address] [phone]. Please call pt when sent.

## 2023-08-16 ENCOUNTER — Other Ambulatory Visit

## 2023-08-16 ENCOUNTER — Telehealth: Payer: Self-pay

## 2023-08-16 ENCOUNTER — Telehealth: Payer: Self-pay | Admitting: Nurse Practitioner

## 2023-08-16 NOTE — Telephone Encounter (Signed)
 LM for patient. She is scheduled here for xray today but we do not have xray in our office the rest of this week. She needs to go to out patient imaging to have her xray done today per Dr Geralyn Knee.  [facility] [address] [place], [place] [address] Phone: [phone] Hours: 8-5

## 2023-08-16 NOTE — Addendum Note (Signed)
 Addended by: Victorino Grates D on: 08/16/2023 08:51 AM   Modules accepted: Orders

## 2023-08-18 ENCOUNTER — Ambulatory Visit (INDEPENDENT_AMBULATORY_CARE_PROVIDER_SITE_OTHER): Admitting: Internal Medicine

## 2023-08-18 VITALS — BP 120/70 | HR 60 | Temp 97.9°F | Resp 16 | Ht 63.0 in | Wt 217.6 lb

## 2023-08-18 DIAGNOSIS — R739 Hyperglycemia, unspecified: Secondary | ICD-10-CM | POA: Diagnosis not present

## 2023-08-18 DIAGNOSIS — R051 Acute cough: Secondary | ICD-10-CM | POA: Diagnosis not present

## 2023-08-18 DIAGNOSIS — E039 Hypothyroidism, unspecified: Secondary | ICD-10-CM

## 2023-08-18 DIAGNOSIS — K219 Gastro-esophageal reflux disease without esophagitis: Secondary | ICD-10-CM

## 2023-08-18 DIAGNOSIS — D649 Anemia, unspecified: Secondary | ICD-10-CM

## 2023-08-18 DIAGNOSIS — I4819 Other persistent atrial fibrillation: Secondary | ICD-10-CM | POA: Diagnosis not present

## 2023-08-18 DIAGNOSIS — G4733 Obstructive sleep apnea (adult) (pediatric): Secondary | ICD-10-CM | POA: Diagnosis not present

## 2023-08-18 DIAGNOSIS — I10 Essential (primary) hypertension: Secondary | ICD-10-CM | POA: Diagnosis not present

## 2023-08-18 DIAGNOSIS — R0602 Shortness of breath: Secondary | ICD-10-CM

## 2023-08-18 DIAGNOSIS — D509 Iron deficiency anemia, unspecified: Secondary | ICD-10-CM

## 2023-08-18 DIAGNOSIS — F439 Reaction to severe stress, unspecified: Secondary | ICD-10-CM | POA: Diagnosis not present

## 2023-08-18 DIAGNOSIS — E7800 Pure hypercholesterolemia, unspecified: Secondary | ICD-10-CM | POA: Diagnosis not present

## 2023-08-18 NOTE — Progress Notes (Unsigned)
 Subjective:    Patient ID: Amanda Browning, female    DOB: [birth year], 78 y.o.   MRN: [id number]  Patient here for  Chief Complaint  Patient presents with  . Medical Management of Chronic Issues    HPI Here for a scheduled follow up - follow up regarding afib, hypercholesterolemia, hyperglycemia, GERD and hypertension. Had f/u with cardiology 07/27/23. Recommended to continue diltiazem , lopressor , losartan , spironolactone , farxiga  and eliquis . Takes torsemide  prn. Had f/u with GI 08/10/23 - recommended continuing omeprazole  - increased to 40mg  q day. Recently evaluated for increased cough. Was seen 08/15/23 - reported two weeks - persistent cough - productive of green mucus. Was given duoneb in the office. Rx given for augmentin , prednisone  and tessalon  perles. CXR ordered.    Past Medical History:  Diagnosis Date  . Anemia   . Chicken pox   . Colon polyps   . Complication of anesthesia    doesn't take much anesthesia to put pt to sleep, slow to wake up  . DDD (degenerative disc disease), cervical 02/05/2013   C5-C6 and C6-C7  . GERD (gastroesophageal reflux disease)   . Heart murmur   . History of atrial fibrillation   . History of chicken pox   . Hypercholesterolemia   . Hyperlipidemia   . Hypertension   . Hypothyroidism   . Migraines   . PONV (postoperative nausea and vomiting)   . Sleep apnea   . Squamous cell skin cancer    Past Surgical History:  Procedure Laterality Date  . BREAST BIOPSY Right 2012   stereotactic  . CARDIOVERSION N/A 04/29/2021   Procedure: CARDIOVERSION;  Surgeon: Devorah Fonder, MD;  Location: [facility] ORS;  Service: Cardiovascular;  Laterality: N/A;  . CATARACT EXTRACTION W/PHACO Left 09/06/2021   Procedure: CATARACT EXTRACTION PHACO AND INTRAOCULAR LENS PLACEMENT (IOC) LEFT 6.60 00:43.8;  Surgeon: Rosa College, MD;  Location: [facility];  Service: Ophthalmology;  Laterality: Left;  . CATARACT EXTRACTION W/PHACO Right 10/04/2021    Procedure: CATARACT EXTRACTION PHACO AND INTRAOCULAR LENS PLACEMENT (IOC) RIGHT 6.08 00:45.0;  Surgeon: Rosa College, MD;  Location: [facility];  Service: Ophthalmology;  Laterality: Right;  . COLONOSCOPY N/A 03/23/2022   Procedure: COLONOSCOPY;  Surgeon: Toledo, Alphonsus Jeans, MD;  Location: [facility] ENDOSCOPY;  Service: Gastroenterology;  Laterality: N/A;  . COLONOSCOPY WITH PROPOFOL  N/A 02/23/2018   Procedure: COLONOSCOPY WITH PROPOFOL ;  Surgeon: Cassie Click, MD;  Location: [facility] END[facility]COPY;  Service: Endoscopy;  Laterality: N/A;  . ESOPHAGOGASTRODUODENOSCOPY N/A 03/15/2021   Procedure: ESOPHAGOGASTRODUODENOSCOPY (EGD);  Surgeon: Selena Daily, MD;  Location: [facility];  Service: Gastroenterology;  Laterality: N/A;  . ESOPHAGOGASTRODUODENOSCOPY N/A 03/23/2022   Procedure: ESOPHAGOGASTRODUODENOSCOPY (EGD);  Surgeon: Toledo, Alphonsus Jeans, MD;  Location: [facility] ENDOSCOPY;  Service: Gastroenterology;  Laterality: N/A;  . KNEE ARTHROPLASTY Right 04/17/2017   Procedure: COMPUTER ASSISTED TOTAL KNEE ARTHROPLASTY;  Surgeon: Arlyne Lame, MD;  Location: [facility] ORS;  Service: Orthopedics;  Laterality: Right;  . SKIN BIOPSY    . VAGINAL HYSTERECTOMY  1994   ovaries not removed  . WISDOM TOOTH EXTRACTION     Family History  Problem Relation Age of Onset  . Heart disease Mother   . Stroke Mother   . Hypertension Mother   . Heart attack Mother   . Heart disease Father        enlarged heart  . Stroke Father   . Hypertension Father   . Hyperlipidemia Father   . Heart attack Father   .  Ovarian cancer Paternal Aunt   . Breast cancer Sister 55  . Cancer Sister        Breast Cancer  . Diabetes type II Brother   . Hypertension Brother   . Colon cancer Neg Hx   . Basal cell carcinoma Neg Hx   . Melanoma Neg Hx   . Squamous cell carcinoma Neg Hx    Social History   Socioeconomic History  . Marital status: Divorced    Spouse name: Not on file  . Number of children: 2  . Years  of education: 47  . Highest education level: High school graduate  Occupational History  . Not on file  Tobacco Use  . Smoking status: Never    Passive exposure: Never  . Smokeless tobacco: Never  Vaping Use  . Vaping status: Never Used  Substance and Sexual Activity  . Alcohol use: No    Alcohol/week: 0.0 standard drinks of alcohol  . Drug use: No  . Sexual activity: Never  Other Topics Concern  . Not on file  Social History Narrative   Full Code   Alcohol - none   Never smoker and no smokeless tobacco   2 daughters   Social Drivers of Corporate investment banker Strain: Low Risk  (04/19/2021)   Received [facility]   Overall Financial Resource Strain (CARDIA)   . Difficulty of Paying Living Expenses: Not very hard  Food Insecurity: No Food Insecurity (04/19/2021)   [facility]   Hunger Vital Sign   . Worried About Programme researcher, broadcasting/film/video in the Last Year: Never true   . Ran Out of Food in the Last Year: Never true  Transportation Needs: No Transportation Needs (04/19/2021)   Received from [facility], [facility]   [facility] - Transportation   . Lack of Transportation (Medical): No   . Lack of Transportation (Non-Medical): No  Physical Activity: Insufficiently Active (07/28/2017)   Exercise Vital Sign   . Days of Exercise per Week: 4 days   . Minutes of Exercise per Session: 30 min  Stress: No Stress Concern Present (07/28/2017)   [employer] of Occupational Health - Occupational Stress Questionnaire   . Feeling of Stress : Not at all  Social Connections: Not on file     Review of Systems     Objective:     BP 120/70   Pulse 60   Temp 97.9 F (36.6 C)   Resp 16   Ht 5\' 3"  (1.6 m)   Wt 217 lb 9.6 oz (98.7 kg)   SpO2 97%   BMI 38.55 kg/m  Wt Readings from Last 3 Encounters:  08/18/23 217 lb 9.6 oz (98.7 kg)  08/15/23 217 lb 3.2 oz (98.5 kg)  07/27/23 217 lb 12.8 oz (98.8 kg)     Physical Exam  {Perform Simple Foot Exam  Perform Detailed exam:1} {Insert foot Exam (Optional):30965}   Outpatient Encounter Medications as of 08/18/2023  Medication Sig  . acetaminophen  (TYLENOL ) 325 MG tablet Take 650 mg by mouth 4 (four) times daily as needed.  . amoxicillin -clavulanate (AUGMENTIN ) 875-125 MG tablet Take 1 tablet by mouth 2 (two) times daily.  . benzonatate  (TESSALON  PERLES) 100 MG capsule Take 1 capsule (100 mg total) by mouth 3 (three) times daily as needed.  . cetirizine  (ZYRTEC ) 10 MG tablet Take 10 mg by mouth every evening.  . diltiazem  (CARDIZEM  CD) 240 MG 24 hr  capsule TAKE 1 CAPSULE EVERY DAY  . ELIQUIS  5 MG TABS tablet TAKE 1 TABLET TWICE DAILY  . FARXIGA  10 MG TABS tablet TAKE 1 TABLET EVERY DAY BEFORE BREAKFAST  . ferrous sulfate  325 (65 FE) MG tablet Take 325 mg by mouth in the morning.  . levothyroxine  (SYNTHROID ) 75 MCG tablet TAKE 1 TABLET EVERY DAY AT 6AM  . losartan  (COZAAR ) 25 MG tablet TAKE 1/2 TABLET EVERY MORNING  . magnesium  oxide (MAG-OX) 400 MG tablet Take 1 tablet (400 mg total) by mouth 2 (two) times daily.  . metoprolol  tartrate (LOPRESSOR ) 100 MG tablet TAKE 1 TABLET TWICE DAILY  . Multiple Vitamin (MULTIVITAMIN WITH MINERALS) TABS tablet Take 1 tablet by mouth in the morning.  . omeprazole  (PRILOSEC) 40 MG capsule Take 40 mg by mouth.  . potassium chloride  (MICRO-K ) 10 MEQ CR capsule Take 1 tablet (10 Meq) twice weekly with Torsemide . May take 1 extra tablet weekly as needed with Torsemide .  . predniSONE  (DELTASONE ) 20 MG tablet Take 2 tablets (40 mg total) by mouth daily with breakfast for 5 days.  . rosuvastatin  (CRESTOR ) 20 MG tablet TAKE 1 TABLET EVERY DAY  . sertraline  (ZOLOFT ) 50 MG tablet TAKE 1 TABLET DAILY  . spironolactone  (ALDACTONE ) 25 MG tablet TAKE 1 TABLET EVERY DAY  . torsemide  (DEMADEX ) 20 MG tablet Take Torsemide  1 tablet ( 20 MG) twice weekly. May take 1 extra tablet ( 20 MG ) once weekly as needed for shortness of  breath.  . Vibegron  (GEMTESA ) 75 MG TABS TAKE 1 TABLET EVERY DAY  . [DISCONTINUED] Vibegron  (GEMTESA ) 75 MG TABS Take 1 tablet (75 mg total) by mouth daily.  . [DISCONTINUED] Vibegron  (GEMTESA ) 75 MG TABS Take 1 tablet (75 mg total) by mouth daily.   No facility-administered encounter medications on file as of 08/18/2023.     Lab Results  Component Value Date   WBC 5.5 09/19/2022   HGB 13.1 09/19/2022   HCT 40.8 09/19/2022   PLT 214.0 09/19/2022   GLUCOSE 87 07/27/2023   CHOL 172 01/26/2023   TRIG 105.0 01/26/2023   HDL 65.80 01/26/2023   LDLDIRECT 131.0 03/19/2013   LDLCALC 85 01/26/2023   ALT 25 01/26/2023   AST 20 01/26/2023   NA 143 07/27/2023   K 5.3 (H) 07/27/2023   CL 100 07/27/2023   CREATININE 1.75 (H) 07/27/2023   BUN 24 07/27/2023   CO2 25 07/27/2023   TSH 1.75 05/19/2022   INR 1.2 03/12/2021   HGBA1C 6.1 01/26/2023    MM 3D SCREENING MAMMOGRAM BILATERAL BREAST Result Date: 07/10/2023 CLINICAL DATA:  Screening. EXAM: DIGITAL SCREENING BILATERAL MAMMOGRAM WITH TOMOSYNTHESIS AND CAD TECHNIQUE: Bilateral screening digital craniocaudal and mediolateral oblique mammograms were obtained. Bilateral screening digital breast tomosynthesis was performed. The images were evaluated with computer-aided detection. COMPARISON:  None available. ACR Breast Density Category a: The breasts are almost entirely fatty. FINDINGS: There are no findings suspicious for malignancy. IMPRESSION: No mammographic evidence of malignancy. A result letter of this screening mammogram will be mailed directly to the patient. RECOMMENDATION: Screening mammogram in one year. (Code:SM-B-01Y) BI-RADS CATEGORY  1: Negative. Electronically Signed   By: Dina  Arceo M.D.   On: 07/10/2023 10:38       Assessment & Plan:  Persistent atrial fibrillation (HCC)  Hypercholesterolemia  Hypothyroidism, unspecified type  Primary hypertension  Hyperglycemia     Dellar Fenton, MD

## 2023-08-19 ENCOUNTER — Encounter: Payer: Self-pay | Admitting: Internal Medicine

## 2023-08-19 LAB — CBC WITH DIFFERENTIAL/PLATELET
Absolute Lymphocytes: 1049 {cells}/uL (ref 850–3900)
Absolute Monocytes: 254 {cells}/uL (ref 200–950)
Basophils Absolute: 21 {cells}/uL (ref 0–200)
Basophils Relative: 0.2 %
Eosinophils Absolute: 0 {cells}/uL — ABNORMAL LOW (ref 15–500)
Eosinophils Relative: 0 %
HCT: 46 % — ABNORMAL HIGH (ref 35.0–45.0)
Hemoglobin: 15.2 g/dL (ref 11.7–15.5)
MCH: 32.8 pg (ref 27.0–33.0)
MCHC: 33 g/dL (ref 32.0–36.0)
MCV: 99.4 fL (ref 80.0–100.0)
MPV: 9.8 fL (ref 7.5–12.5)
Monocytes Relative: 2.4 %
Neutro Abs: 9275 {cells}/uL — ABNORMAL HIGH (ref 1500–7800)
Neutrophils Relative %: 87.5 %
Platelets: 195 10*3/uL (ref 140–400)
RBC: 4.63 10*6/uL (ref 3.80–5.10)
RDW: 11.8 % (ref 11.0–15.0)
Total Lymphocyte: 9.9 %
WBC: 10.6 10*3/uL (ref 3.8–10.8)

## 2023-08-19 LAB — BASIC METABOLIC PANEL WITH GFR
BUN/Creatinine Ratio: 14 (calc) (ref 6–22)
BUN: 18 mg/dL (ref 7–25)
CO2: 25 mmol/L (ref 20–32)
Calcium: 9.7 mg/dL (ref 8.6–10.4)
Chloride: 102 mmol/L (ref 98–110)
Creat: 1.27 mg/dL — ABNORMAL HIGH (ref 0.60–1.00)
Glucose, Bld: 128 mg/dL — ABNORMAL HIGH (ref 65–99)
Potassium: 4.5 mmol/L (ref 3.5–5.3)
Sodium: 137 mmol/L (ref 135–146)
eGFR: 44 mL/min/{1.73_m2} — ABNORMAL LOW (ref >=60)

## 2023-08-19 LAB — LIPID PANEL
Cholesterol: 187 mg/dL (ref <200)
HDL: 72 mg/dL (ref >=50)
LDL Cholesterol (Calc): 94 mg/dL
Non-HDL Cholesterol (Calc): 115 mg/dL (ref <130)
Total CHOL/HDL Ratio: 2.6 (calc) (ref <5.0)
Triglycerides: 112 mg/dL (ref <150)

## 2023-08-19 LAB — HEPATIC FUNCTION PANEL
AG Ratio: 1.7 (calc) (ref 1.0–2.5)
ALT: 21 U/L (ref 6–29)
AST: 20 U/L (ref 10–35)
Albumin: 4.4 g/dL (ref 3.6–5.1)
Alkaline phosphatase (APISO): 67 U/L (ref 37–153)
Bilirubin, Direct: 0.1 mg/dL (ref 0.0–0.2)
Globulin: 2.6 g/dL (ref 1.9–3.7)
Indirect Bilirubin: 0.6 mg/dL (ref 0.2–1.2)
Total Bilirubin: 0.7 mg/dL (ref 0.2–1.2)
Total Protein: 7 g/dL (ref 6.1–8.1)

## 2023-08-19 LAB — HEMOGLOBIN A1C
Hgb A1c MFr Bld: 5.9 % — ABNORMAL HIGH (ref <5.7)
Mean Plasma Glucose: 123 mg/dL
eAG (mmol/L): 6.8 mmol/L

## 2023-08-19 LAB — TSH: TSH: 0.37 m[IU]/L — ABNORMAL LOW (ref 0.40–4.50)

## 2023-08-19 NOTE — Assessment & Plan Note (Signed)
 Low carb diet and exercise. Follow met b and A1c.   Lab Results  Component Value Date   HGBA1C 5.9 (H) 08/18/2023

## 2023-08-19 NOTE — Assessment & Plan Note (Signed)
 Continued sob with exertion. Has seen cardiology. Cardiology felt - stable. Discussed pulmonary evaluation given persistent sob. Also history of sleep apnea. Arrange f/u.

## 2023-08-19 NOTE — Assessment & Plan Note (Signed)
 On eliquis .  Continues on diltiazem  and metoprolol .  Appears to be in SR today. Stable. No changes.

## 2023-08-19 NOTE — Assessment & Plan Note (Signed)
 Cough and congestion improved. Exam as outlined. Continue augmentin  and prednisone .  Call with update. Wants to hold on cxr.

## 2023-08-19 NOTE — Assessment & Plan Note (Signed)
 On crestor.  Low cholesterol diet and exercise.  Follow lipid panel and liver function tests.

## 2023-08-19 NOTE — Assessment & Plan Note (Signed)
 Currently on cardizem  and lopressor . Also taking losartan  and aldactone .  Follow pressures. Follow metabolic panel.

## 2023-08-19 NOTE — Assessment & Plan Note (Signed)
 Follow cbc.

## 2023-08-19 NOTE — Assessment & Plan Note (Signed)
 Overall appears to be doing well. Continue zoloft .

## 2023-08-19 NOTE — Assessment & Plan Note (Signed)
Has seen pulmonary.  CPAP.

## 2023-08-19 NOTE — Assessment & Plan Note (Signed)
No upper symptoms reported.  Continue prilosec.  

## 2023-08-19 NOTE — Assessment & Plan Note (Signed)
On synthroid.  Follow tsh.   

## 2023-08-19 NOTE — Assessment & Plan Note (Signed)
S/p venofer.  Follow cbc.   

## 2023-08-22 ENCOUNTER — Other Ambulatory Visit: Payer: Self-pay

## 2023-08-22 DIAGNOSIS — E039 Hypothyroidism, unspecified: Secondary | ICD-10-CM

## 2023-08-22 MED ORDER — LEVOTHYROXINE SODIUM 50 MCG PO TABS: 50.0000 ug | ORAL_TABLET | Freq: Every day | ORAL | 0 refills | Status: DC

## 2023-08-29 ENCOUNTER — Ambulatory Visit: Admitting: Internal Medicine

## 2023-08-29 ENCOUNTER — Ambulatory Visit: Payer: Self-pay

## 2023-08-29 ENCOUNTER — Encounter: Payer: Self-pay | Admitting: Internal Medicine

## 2023-08-29 VITALS — BP 128/74 | HR 65 | Temp 98.0°F | Resp 16 | Ht 63.0 in | Wt 220.0 lb

## 2023-08-29 DIAGNOSIS — R300 Dysuria: Secondary | ICD-10-CM

## 2023-08-29 DIAGNOSIS — I4891 Unspecified atrial fibrillation: Secondary | ICD-10-CM | POA: Diagnosis not present

## 2023-08-29 DIAGNOSIS — I10 Essential (primary) hypertension: Secondary | ICD-10-CM | POA: Diagnosis not present

## 2023-08-29 LAB — POCT URINALYSIS DIPSTICK
Bilirubin, UA: NEGATIVE
Blood, UA: NEGATIVE
Glucose, UA: POSITIVE — AB
Ketones, UA: NEGATIVE
Leukocytes, UA: NEGATIVE
Nitrite, UA: NEGATIVE
Protein, UA: NEGATIVE
Spec Grav, UA: 1.01 (ref 1.010–1.025)
Urobilinogen, UA: 0.2 U/dL
pH, UA: 7 (ref 5.0–8.0)

## 2023-08-29 LAB — URINALYSIS, MICROSCOPIC ONLY: RBC / HPF: NONE SEEN (ref 0–?)

## 2023-08-29 MED ORDER — AMOXICILLIN-POT CLAVULANATE 500-125 MG PO TABS
1.0000 | ORAL_TABLET | Freq: Two times a day (BID) | ORAL | 0 refills | Status: DC
Start: 2023-08-29 — End: 2023-10-25

## 2023-08-29 NOTE — Progress Notes (Signed)
 Subjective:    Patient ID: Amanda Browning, female    DOB: 22-Dec-1945, 78 y.o.   MRN: [id number]  Patient here for  Chief Complaint  Patient presents with   Urinary Tract Infection    Urinary Tract Infection  Associated symptoms include urgency. Pertinent negatives include no hematuria, nausea or vomiting.   Work in appt. Work in with concerns regarding possible UTI. Reports symptoms started Saturday 08/26/23. Noticed some increased urgency. Also urine hesitancy. Some dysuria. No hematuria. Had back pain previously for two weeks. She related this to working out in her yard. Some abdominal pressure. No vaginal symptoms. No vomiting. Started taking augmentin  (that she had left over) on Sunday 08/27/23. Has taken for the last 2.5 days. symptoms have improved.    Past Medical History:  Diagnosis Date   Anemia    Chicken pox    Colon polyps    Complication of anesthesia    doesn't take much anesthesia to put pt to sleep, slow to wake up   DDD (degenerative disc disease), cervical 02/05/2013   C5-C6 and C6-C7   GERD (gastroesophageal reflux disease)    Heart murmur    History of atrial fibrillation    History of chicken pox    Hypercholesterolemia    Hyperlipidemia    Hypertension    Hypothyroidism    Migraines    PONV (postoperative nausea and vomiting)    Sleep apnea    Squamous cell skin cancer    Past Surgical History:  Procedure Laterality Date   BREAST BIOPSY Right 2012   stereotactic   CARDIOVERSION N/A 04/29/2021   Procedure: CARDIOVERSION;  Surgeon: Devorah Fonder, MD;  Location: [facility] ORS;  Service: Cardiovascular;  Laterality: N/A;   CATARACT EXTRACTION W/PHACO Left 09/06/2021   Procedure: CATARACT EXTRACTION PHACO AND INTRAOCULAR LENS PLACEMENT (IOC) LEFT 6.60 00:43.8;  Surgeon: Rosa College, MD;  Location: [facility];  Service: Ophthalmology;  Laterality: Left;   CATARACT EXTRACTION W/PHACO Right 10/04/2021   Procedure: CATARACT EXTRACTION PHACO  AND INTRAOCULAR LENS PLACEMENT (IOC) RIGHT 6.08 00:45.0;  Surgeon: Rosa College, MD;  Location: [facility];  Service: Ophthalmology;  Laterality: Right;   COLONOSCOPY N/A 03/23/2022   Procedure: COLONOSCOPY;  Surgeon: Toledo, Alphonsus Jeans, MD;  Location: [facility] ENDOSCOPY;  Service: Gastroenterology;  Laterality: N/A;   COLONOSCOPY WITH PROPOFOL  N/A 02/23/2018   Procedure: COLONOSCOPY WITH PROPOFOL ;  Surgeon: Cassie Click, MD;  Location: [facility] ENDOSCOPY;  Service: Endoscopy;  Laterality: N/A;   ESOPHAGOGASTRODUODENOSCOPY N/A 03/15/2021   Procedure: ESOPHAGOGASTRODUODENOSCOPY (EGD);  Surgeon: Selena Daily, MD;  Location: [facility] ENDOSCOPY;  Service: Gastroenterology;  Laterality: N/A;   ESOPHAGOGASTRODUODENOSCOPY N/A 03/23/2022   Procedure: ESOPHAGOGASTRODUODENOSCOPY (EGD);  Surgeon: Toledo, Alphonsus Jeans, MD;  Location: [facility] ENDOSCOPY;  Service: Gastroenterology;  Laterality: N/A;   KNEE ARTHROPLASTY Right 04/17/2017   Procedure: COMPUTER ASSISTED TOTAL KNEE ARTHROPLASTY;  Surgeon: Arlyne Lame, MD;  Location: [facility] ORS;  Service: Orthopedics;  Laterality: Right;   SKIN BIOPSY     VAGINAL HYSTERECTOMY  1994   ovaries not removed   WISDOM TOOTH EXTRACTION     Family History  Problem Relation Age of Onset   Heart disease Mother    Stroke Mother    Hypertension Mother    Heart attack Mother    Heart disease Father        enlarged heart   Stroke Father    Hypertension Father    Hyperlipidemia Father    Heart attack Father  Ovarian cancer Paternal Aunt    Breast cancer Sister 36   Cancer Sister        Breast Cancer   Diabetes type II Brother    Hypertension Brother    Colon cancer Neg Hx    Basal cell carcinoma Neg Hx    Melanoma Neg Hx    Squamous cell carcinoma Neg Hx    Social History   Socioeconomic History   Marital status: Divorced    Spouse name: Not on file   Number of children: 2   Years of education: 12   Highest education level: High school graduate   Occupational History   Not on file  Tobacco Use   Smoking status: Never    Passive exposure: Never   Smokeless tobacco: Never  Vaping Use   Vaping status: Never Used  Substance and Sexual Activity   Alcohol use: No    Alcohol/week: 0.0 standard drinks of alcohol   Drug use: No   Sexual activity: Never  Other Topics Concern   Not on file  Social History Narrative   Full Code   Alcohol - none   Never smoker and no smokeless tobacco   2 daughters   Social Drivers of Corporate investment banker Strain: Low Risk  (04/19/2021)   Received from [facility]   Overall Financial Resource Strain (CARDIA)    Difficulty of Paying Living Expenses: Not very hard  Food Insecurity: No Food Insecurity (04/19/2021)   Received from [facility], [facility]   Hunger Vital Sign    Worried About Running Out of Food in the Last Year: Never true    Ran Out of Food in the Last Year: Never true  Transportation Needs: No Transportation Needs (04/19/2021)   Received from [facility], [facility]   [facility] - Transportation    Lack of Transportation (Medical): No    Lack of Transportation (Non-Medical): No  Physical Activity: Insufficiently Active (07/28/2017)   Exercise Vital Sign    Days of Exercise per Week: 4 days    Minutes of Exercise per Session: 30 min  Stress: No Stress Concern Present (07/28/2017)   [employer] of Occupational Health - Occupational Stress Questionnaire    Feeling of Stress : Not at all  Social Connections: Not on file     Review of Systems  Constitutional:  Negative for appetite change and unexpected weight change.  HENT:  Negative for congestion and sinus pressure.   Respiratory:  Negative for cough, chest tightness and shortness of breath.   Cardiovascular:  Negative for chest pain and palpitations.       No increased swelling.   Gastrointestinal:  Negative for abdominal pain, diarrhea, nausea and vomiting.  Genitourinary:   Positive for dysuria and urgency. Negative for difficulty urinating and hematuria.  Musculoskeletal:  Positive for back pain.  Skin:  Negative for color change and rash.  Neurological:  Negative for dizziness and headaches.  Psychiatric/Behavioral:  Negative for agitation.        Objective:     BP 128/74   Pulse 65   Temp 98 F (36.7 C)   Resp 16   Ht 5\' 3"  (1.6 m)   Wt 220 lb (99.8 kg)   SpO2 97%   BMI 38.97 kg/m  Wt Readings from Last 3 Encounters:  08/29/23 220 lb (99.8 kg)  08/18/23 217 lb 9.6 oz (98.7 kg)  08/15/23 217 lb 3.2 oz (98.5 kg)  Physical Exam Vitals reviewed.  Constitutional:      General: She is not in acute distress.    Appearance: Normal appearance.  HENT:     Head: Normocephalic and atraumatic.     Right Ear: External ear normal.     Left Ear: External ear normal.     Mouth/Throat:     Pharynx: No oropharyngeal exudate or posterior oropharyngeal erythema.  Eyes:     General: No scleral icterus.       Right eye: No discharge.        Left eye: No discharge.     Conjunctiva/sclera: Conjunctivae normal.  Neck:     Thyroid : No thyromegaly.  Cardiovascular:     Rate and Rhythm: Normal rate and regular rhythm.  Pulmonary:     Effort: No respiratory distress.     Breath sounds: Normal breath sounds. No wheezing.  Abdominal:     General: Bowel sounds are normal.     Palpations: Abdomen is soft.     Tenderness: There is no abdominal tenderness.  Musculoskeletal:        General: No swelling or tenderness.     Cervical back: Neck supple. No tenderness.  Lymphadenopathy:     Cervical: No cervical adenopathy.  Skin:    Findings: No erythema or rash.  Neurological:     Mental Status: She is alert.  Psychiatric:        Mood and Affect: Mood normal.        Behavior: Behavior normal.         Outpatient Encounter Medications as of 08/29/2023  Medication Sig   amoxicillin -clavulanate (AUGMENTIN ) 500-125 MG tablet Take 1 tablet by mouth 2  (two) times daily.   acetaminophen  (TYLENOL ) 325 MG tablet Take 650 mg by mouth 4 (four) times daily as needed.   benzonatate  (TESSALON  PERLES) 100 MG capsule Take 1 capsule (100 mg total) by mouth 3 (three) times daily as needed.   cetirizine  (ZYRTEC ) 10 MG tablet Take 10 mg by mouth every evening.   diltiazem  (CARDIZEM  CD) 240 MG 24 hr capsule TAKE 1 CAPSULE EVERY DAY   ELIQUIS  5 MG TABS tablet TAKE 1 TABLET TWICE DAILY   FARXIGA  10 MG TABS tablet TAKE 1 TABLET EVERY DAY BEFORE BREAKFAST   ferrous sulfate  325 (65 FE) MG tablet Take 325 mg by mouth in the morning.   levothyroxine  (SYNTHROID ) 50 MCG tablet Take 1 tablet (50 mcg total) by mouth daily.   levothyroxine  (SYNTHROID ) 75 MCG tablet TAKE 1 TABLET EVERY DAY AT 6AM   losartan  (COZAAR ) 25 MG tablet TAKE 1/2 TABLET EVERY MORNING   magnesium  oxide (MAG-OX) 400 MG tablet Take 1 tablet (400 mg total) by mouth 2 (two) times daily.   metoprolol  tartrate (LOPRESSOR ) 100 MG tablet TAKE 1 TABLET TWICE DAILY   Multiple Vitamin (MULTIVITAMIN WITH MINERALS) TABS tablet Take 1 tablet by mouth in the morning.   omeprazole  (PRILOSEC) 40 MG capsule Take 40 mg by mouth.   potassium chloride  (MICRO-K ) 10 MEQ CR capsule Take 1 tablet (10 Meq) twice weekly with Torsemide . May take 1 extra tablet weekly as needed with Torsemide .   rosuvastatin  (CRESTOR ) 20 MG tablet TAKE 1 TABLET EVERY DAY   sertraline  (ZOLOFT ) 50 MG tablet TAKE 1 TABLET DAILY   spironolactone  (ALDACTONE ) 25 MG tablet TAKE 1 TABLET EVERY DAY   torsemide  (DEMADEX ) 20 MG tablet Take Torsemide  1 tablet ( 20 MG) twice weekly. May take 1 extra tablet ( 20 MG ) once weekly as  needed for shortness of breath.   Vibegron  (GEMTESA ) 75 MG TABS TAKE 1 TABLET EVERY DAY   [DISCONTINUED] amoxicillin -clavulanate (AUGMENTIN ) 875-125 MG tablet Take 1 tablet by mouth 2 (two) times daily.   No facility-administered encounter medications on file as of 08/29/2023.     Lab Results  Component Value Date   WBC  10.6 08/18/2023   HGB 15.2 08/18/2023   HCT 46.0 (H) 08/18/2023   PLT 195 08/18/2023   GLUCOSE 128 (H) 08/18/2023   CHOL 187 08/18/2023   TRIG 112 08/18/2023   HDL 72 08/18/2023   LDLDIRECT 131.0 03/19/2013   LDLCALC 94 08/18/2023   ALT 21 08/18/2023   AST 20 08/18/2023   NA 137 08/18/2023   K 4.5 08/18/2023   CL 102 08/18/2023   CREATININE 1.27 (H) 08/18/2023   BUN 18 08/18/2023   CO2 25 08/18/2023   TSH 0.37 (L) 08/18/2023   INR 1.2 03/12/2021   HGBA1C 5.9 (H) 08/18/2023    MM 3D SCREENING MAMMOGRAM BILATERAL BREAST Result Date: 07/10/2023 CLINICAL DATA:  Screening. EXAM: DIGITAL SCREENING BILATERAL MAMMOGRAM WITH TOMOSYNTHESIS AND CAD TECHNIQUE: Bilateral screening digital craniocaudal and mediolateral oblique mammograms were obtained. Bilateral screening digital breast tomosynthesis was performed. The images were evaluated with computer-aided detection. COMPARISON:  None available. ACR Breast Density Category a: The breasts are almost entirely fatty. FINDINGS: There are no findings suspicious for malignancy. IMPRESSION: No mammographic evidence of malignancy. A result letter of this screening mammogram will be mailed directly to the patient. RECOMMENDATION: Screening mammogram in one year. (Code:SM-B-01Y) BI-RADS CATEGORY  1: Negative. Electronically Signed   By: Dina  Arceo M.D.   On: 07/10/2023 10:38       Assessment & Plan:  Dysuria Assessment & Plan: Symptoms appear to be c/w UTI. She started on augmentin . Symptoms improved. Will check urine. Cutlure may not reveal infection given she has been on abx for 2.5 days. Continue augmentin . Await culture results. Follow. Call with update.   Orders: -     POCT urinalysis dipstick -     Urine Microscopic -     Urine Culture  Atrial fibrillation, unspecified type (HCC) Assessment & Plan: On eliquis .  Continues on diltiazem  and metoprolol .  Appears to be in SR today. Stable. No changes.    Primary hypertension Assessment &  Plan: Currently on cardizem  and lopressor . Also taking losartan  and aldactone .  Follow pressures. Follow metabolic panel.    Other orders -     Amoxicillin -Pot Clavulanate; Take 1 tablet by mouth 2 (two) times daily.  Dispense: 6 tablet; Refill: 0     Dellar Fenton, MD

## 2023-08-29 NOTE — Telephone Encounter (Signed)
  Chief Complaint: urinary tract symptoms  Symptoms: burning, retention  Disposition: [] ED /[] Urgent Care (no appt availability in office) / [] Appointment(In office/virtual)/ []  [facility]/ [] Home Care/ [] Refused Recommended Disposition /[]  Mobile Bus/ [x]  Follow-up with PCP Additional Notes: Pt calling with UTI symptoms of retention, burning, mild pain.  Pt was seen in office on 4/29 and 5/2. Pt was prescribed Augmentin  but stopped taking once she felt better. On Saturday the UTI showed up and pt decided to restart Augmentin . Pt has one dose left. Pt is requesting a new medication or more be called in without another office visit if possible. No appt made at this time. Pain is rated 2 or 3. Pt is increasing water and cranberry juice intake. If pt needs to be seen, please call and schedule. If calling in medication, pt wants to send to new pharmacy but didn't know name at this time. Please advise.              Copied from CRM [id number]. Topic: Clinical - Red Word Triage >> Aug 29, 2023 11:25 AM Kita Perish H wrote: [facility] that prompted transfer to Nurse Triage: UTI burning and stomach pain, urge to urinate and only a few drops coming out Reason for Disposition  Urination is difficult to start (i.e., hesitancy) or straining  Answer Assessment - Initial Assessment Questions 1. SYMPTOM: "What's the main symptom you're concerned about?" (e.g., frequency, incontinence)     Retention, burning  2. ONSET: "When did the    start?"     Saturday  3. PAIN: "Is there any pain?" If Yes, ask: "How bad is it?" (Scale: 1-10; mild, moderate, severe)     "Not that bad  2 or 3" 4. CAUSE: "What do you think is causing the symptoms?"     Not sure 5. OTHER SYMPTOMS: "Do you have any other symptoms?" (e.g., blood in urine, fever, flank pain, pain with urination)     Denies  Protocols used: Urinary Symptoms-A-AH

## 2023-08-29 NOTE — Telephone Encounter (Signed)
 Pt scheduled with Dr Lorin Picket

## 2023-08-30 ENCOUNTER — Ambulatory Visit: Payer: Self-pay | Admitting: Internal Medicine

## 2023-08-30 LAB — URINE CULTURE
MICRO NUMBER:: 16448899
Result:: NO GROWTH
SPECIMEN QUALITY:: ADEQUATE

## 2023-09-04 ENCOUNTER — Encounter: Payer: Self-pay | Admitting: Internal Medicine

## 2023-09-04 DIAGNOSIS — R300 Dysuria: Secondary | ICD-10-CM | POA: Insufficient documentation

## 2023-09-04 NOTE — Assessment & Plan Note (Signed)
 On eliquis .  Continues on diltiazem  and metoprolol .  Appears to be in SR today. Stable. No changes.

## 2023-09-04 NOTE — Assessment & Plan Note (Signed)
 Symptoms appear to be c/w UTI. She started on augmentin . Symptoms improved. Will check urine. Cutlure may not reveal infection given she has been on abx for 2.5 days. Continue augmentin . Await culture results. Follow. Call with update.

## 2023-09-04 NOTE — Assessment & Plan Note (Signed)
 Currently on cardizem  and lopressor . Also taking losartan  and aldactone .  Follow pressures. Follow metabolic panel.

## 2023-09-14 ENCOUNTER — Other Ambulatory Visit: Payer: Self-pay | Admitting: Internal Medicine

## 2023-09-19 ENCOUNTER — Other Ambulatory Visit: Payer: Self-pay | Admitting: Internal Medicine

## 2023-09-27 ENCOUNTER — Other Ambulatory Visit: Payer: Self-pay | Admitting: Internal Medicine

## 2023-09-27 NOTE — Telephone Encounter (Signed)
 Copied from CRM [id number]. Topic: Clinical - Medication Refill >> Sep 27, 2023  2:53 PM Earnestine Goes B wrote: Medication: levothyroxine  (SYNTHROID ) 50 MCG tablet  Has the patient contacted their pharmacy? Yes (Agent: If no, request that the patient contact the pharmacy for the refill. If patient does not wish to contact the pharmacy document the reason why and proceed with request.) (Agent: If yes, when and what did the pharmacy advise?)  This is the patient's preferred pharmacy:  [facility] - [place], [place] - [address] [place] [address] Phone: [phone] Fax: [phone]   Is this the correct pharmacy for this prescription? Yes If no, delete pharmacy and type the correct one.   Has the prescription been filled recently? Yes  Is the patient out of the medication? Yes  Has the patient been seen for an appointment in the last year OR does the patient have an upcoming appointment? Yes  Can we respond through MyChart? Yes  Agent: Please be advised that Rx refills may take up to 3 business days. We ask that you follow-up with your pharmacy.

## 2023-09-28 MED ORDER — LEVOTHYROXINE SODIUM 50 MCG PO TABS: 50.0000 ug | ORAL_TABLET | Freq: Every day | ORAL | 2 refills | Status: DC

## 2023-10-05 ENCOUNTER — Other Ambulatory Visit (INDEPENDENT_AMBULATORY_CARE_PROVIDER_SITE_OTHER)

## 2023-10-05 ENCOUNTER — Ambulatory Visit: Payer: Self-pay | Admitting: Internal Medicine

## 2023-10-05 DIAGNOSIS — E039 Hypothyroidism, unspecified: Secondary | ICD-10-CM

## 2023-10-05 LAB — TSH: TSH: 4.11 u[IU]/mL (ref 0.35–5.50)

## 2023-10-12 ENCOUNTER — Ambulatory Visit: Admitting: Internal Medicine

## 2023-10-12 ENCOUNTER — Encounter: Payer: Self-pay | Admitting: Internal Medicine

## 2023-10-12 VITALS — BP 120/68 | HR 75 | Temp 98.3°F | Ht 63.0 in | Wt 217.8 lb

## 2023-10-12 DIAGNOSIS — R5381 Other malaise: Secondary | ICD-10-CM

## 2023-10-12 DIAGNOSIS — J4520 Mild intermittent asthma, uncomplicated: Secondary | ICD-10-CM | POA: Diagnosis not present

## 2023-10-12 DIAGNOSIS — Z6838 Body mass index (BMI) 38.0-38.9, adult: Secondary | ICD-10-CM | POA: Diagnosis not present

## 2023-10-12 DIAGNOSIS — G4733 Obstructive sleep apnea (adult) (pediatric): Secondary | ICD-10-CM

## 2023-10-12 DIAGNOSIS — E669 Obesity, unspecified: Secondary | ICD-10-CM

## 2023-10-12 MED ORDER — ALBUTEROL SULFATE HFA 108 (90 BASE) MCG/ACT IN AERS
2.0000 | INHALATION_SPRAY | Freq: Four times a day (QID) | RESPIRATORY_TRACT | 2 refills | Status: AC | PRN
Start: 2023-10-12 — End: ?

## 2023-10-12 NOTE — Patient Instructions (Signed)
 Plan to use albuterol  as needed, can use 2 puffs  prior to activity  Excellent Job A+ GOLD STAR!!  Continue CPAP as prescribed  Patient Instructions Continue to use CPAP every night, minimum of 4-6 hours a night.  Change equipment every 30 days or as directed by DME.  Wash your tubing with warm soap and water daily, hang to dry. Wash humidifier portion weekly. Use bottled, distilled water and change daily   Be aware of reduced alertness and do not drive or operate heavy machinery if experiencing this or drowsiness.  Exercise encouraged, as tolerated. Encouraged proper weight management.  Important to get eight or more hours of sleep  Limiting the use of the computer and television before bedtime.  Decrease naps during the day, so night time sleep will become enhanced.  Limit caffeine, and sleep deprivation.    Avoid Allergens and Irritants Avoid secondhand smoke Avoid SICK contacts Recommend  Masking  when appropriate Recommend Keep up-to-date with vaccinations Recommend weight loss

## 2023-10-12 NOTE — Progress Notes (Signed)
 Synopsis 78 year old female seen for sleep consult April 26, 2021 for daytime sleepiness found to have moderate to severe sleep apnea Medical history significant for GI bleed, atrial fibrillation, hyperlipidemia, GERD, hiatal hernia and hypertension  TEST/EVENTS :  [facility] sleep study June 01, 2021-AHI 25.2/hour SPO2 low at 80%, supine AHI 58.3/hour   Chief complaint Follow-up assessment for sleep apnea assessment for shortness of breath   HPI Assessment of sleep apnea  Discussed sleep data and reviewed with patient.  Encouraged proper weight management.  Discussed driving precautions and its relationship with hypersomnolence.  Discussed sleep hygiene, and benefits of a fixed sleep waked time.  The importance of getting eight or more hours of sleep discussed with patient.  Discussed limiting the use of the computer and television before bedtime.  Decrease naps during the day, so night time sleep will become enhanced.  Limit caffeine, and sleep deprivation.   Patient uses and benefits from therapy Using CPAP nightly and with naps Pressure setting is comfortable and is sleeping well. Auto CPAP 5-15 AHI reduced to 9 Under percent compliance for days and greater than 4 hours  Assessment of shortness of breath Mainly with exertion Likely related to morbid obesity and deconditioned state No shortness of breath at rest Plan for albuterol  as needed No exacerbation at this time No evidence of heart failure at this time No evidence or signs of infection at this time No respiratory distress No fevers, chills, nausea, vomiting, diarrhea No evidence of lower extremity edema No evidence hemoptysis    Allergies  Allergen Reactions   Codeine Other (See Comments)    Altered mental status- Cannot function    Immunization History  Administered Date(s) Administered   Fluad Quad(high Dose 65+) 02/12/2019, 01/07/2020, 01/12/2021, 01/17/2022   Fluad Trivalent(High Dose 65+)  01/26/2023   Influenza Split 04/03/2012   Influenza, High Dose Seasonal PF 01/20/2017, 01/31/2018   Influenza,inj,Quad PF,6+ Mos 01/15/2013, 01/23/2014, 12/10/2014   Pneumococcal Conjugate-13 01/23/2014   Pneumococcal Polysaccharide-23 01/15/2013    Past Medical History:  Diagnosis Date   Anemia    Chicken pox    Colon polyps    Complication of anesthesia    doesn't take much anesthesia to put pt to sleep, slow to wake up   DDD (degenerative disc disease), cervical 02/05/2013   C5-C6 and C6-C7   GERD (gastroesophageal reflux disease)    Heart murmur    History of atrial fibrillation    History of chicken pox    Hypercholesterolemia    Hyperlipidemia    Hypertension    Hypothyroidism    Migraines    PONV (postoperative nausea and vomiting)    Sleep apnea    Squamous cell skin cancer     Tobacco History: Social History   Tobacco Use  Smoking Status Never   Passive exposure: Never  Smokeless Tobacco Never   Counseling given: Not Answered   Outpatient Medications Prior to Visit  Medication Sig Dispense Refill   acetaminophen  (TYLENOL ) 325 MG tablet Take 650 mg by mouth 4 (four) times daily as needed.     benzonatate  (TESSALON  PERLES) 100 MG capsule Take 1 capsule (100 mg total) by mouth 3 (three) times daily as needed. 30 capsule 0   cetirizine  (ZYRTEC ) 10 MG tablet Take 10 mg by mouth every evening.     diltiazem  (CARDIZEM  CD) 240 MG 24 hr capsule TAKE 1 CAPSULE EVERY DAY 90 capsule 1   ELIQUIS  5 MG TABS tablet TAKE 1 TABLET TWICE DAILY 180 tablet 3  FARXIGA  10 MG TABS tablet TAKE 1 TABLET EVERY DAY BEFORE BREAKFAST 90 tablet 3   ferrous sulfate  325 (65 FE) MG tablet Take 325 mg by mouth in the morning.     levothyroxine  (SYNTHROID ) 50 MCG tablet Take 1 tablet (50 mcg total) by mouth daily. 30 tablet 2   losartan  (COZAAR ) 25 MG tablet TAKE 1/2 TABLET EVERY MORNING 45 tablet 3   magnesium  oxide (MAG-OX) 400 MG tablet Take 1 tablet (400 mg total) by mouth 2 (two)  times daily. 60 tablet 2   metoprolol  tartrate (LOPRESSOR ) 100 MG tablet TAKE 1 TABLET TWICE DAILY 180 tablet 3   Multiple Vitamin (MULTIVITAMIN WITH MINERALS) TABS tablet Take 1 tablet by mouth in the morning.     omeprazole  (PRILOSEC) 20 MG capsule Take 20 mg by mouth 2 (two) times daily.     potassium chloride  (MICRO-K ) 10 MEQ CR capsule Take 1 tablet (10 Meq) twice weekly with Torsemide . May take 1 extra tablet weekly as needed with Torsemide . 36 capsule 3   rosuvastatin  (CRESTOR ) 20 MG tablet TAKE 1 TABLET EVERY DAY 90 tablet 3   sertraline  (ZOLOFT ) 50 MG tablet TAKE 1 TABLET EVERY DAY 90 tablet 3   spironolactone  (ALDACTONE ) 25 MG tablet TAKE 1 TABLET EVERY DAY 90 tablet 1   torsemide  (DEMADEX ) 20 MG tablet Take Torsemide  1 tablet ( 20 MG) twice weekly. May take 1 extra tablet ( 20 MG ) once weekly as needed for shortness of breath. 36 tablet 3   Vibegron  (GEMTESA ) 75 MG TABS TAKE 1 TABLET EVERY DAY 90 tablet 2   amoxicillin -clavulanate (AUGMENTIN ) 500-125 MG tablet Take 1 tablet by mouth 2 (two) times daily. (Patient not taking: Reported on 10/12/2023) 6 tablet 0   omeprazole  (PRILOSEC) 40 MG capsule Take 40 mg by mouth.     No facility-administered medications prior to visit.     BP 120/68 (BP Location: Right Arm, Patient Position: Sitting, Cuff Size: Large)   Pulse 75   Temp 98.3 F (36.8 C) (Oral)   Ht 5' 3 (1.6 m)   Wt 217 lb 12.8 oz (98.8 kg)   SpO2 91%   BMI 38.58 kg/m      Review of Systems: Gen:  Denies  fever, sweats, chills weight loss  HEENT: Denies blurred vision, double vision, ear pain, eye pain, hearing loss, nose bleeds, sore throat Cardiac:  No dizziness, chest pain or heaviness, chest tightness,edema, No JVD Resp:   No cough, -sputum production, +shortness of breath,-wheezing, -hemoptysis,  Other:  All other systems negative   Physical Examination:   General Appearance: No distress  EYES PERRLA, EOM intact.   NECK Supple, No JVD Pulmonary: normal  breath sounds, No wheezing.  CardiovascularNormal S1,S2.  No m/r/g.   Abdomen: Benign, Soft, non-tender. Neurology UE/LE 5/5 strength, no focal deficits Ext pulses intact, cap refill intact ALL OTHER ROS ARE NEGATIVE    Assessment & Plan:   78 year old pleasant white female seen today for underlying diagnosis of moderate severe sleep apnea in the setting of morbid obesity deconditioned state with underlying intermittent reactive airways disease and shortness of breath   Assessment of OSA-recommend referral to mask desensitization evaluation Previous AHI 25, 58 Continue CPAP as prescribed  Excellent compliance report Reviewed compliance report in detail with patient Patient definitely benefits the use of CPAP therapy as prescribed Using CPAP nightly and with naps Pressure setting is comfortable and is sleeping well. CPAP prescription 5-15 AHI reduced to 9  No evidence of acute heart  failure at this time No respiratory distress No fevers, chills, nausea, vomiting, diarrhea No evidence hemoptysis  Patient Instructions Continue to use CPAP every night, minimum of 4-6 hours a night.  Change equipment every 30 days or as directed by DME.  Wash your tubing with warm soap and water daily, hang to dry. Wash humidifier portion weekly. Use bottled, distilled water and change daily   Be aware of reduced alertness and do not drive or operate heavy machinery if experiencing this or drowsiness.  Exercise encouraged, as tolerated. Encouraged proper weight management.  Important to get eight or more hours of sleep  Limiting the use of the computer and television before bedtime.  Decrease naps during the day, so night time sleep will become enhanced.  Limit caffeine, and sleep deprivation.  HTN, stroke, uncontrolled diabetes and heart failure are potential risk factors.  Risk of untreated sleep apnea including cardiac arrhthymias, stroke, DM, pulm HTN.     Shortness of breath likely  related to morbid obesity deconditioned state and intermittent reactive airways disease Recommend albuterol  as needed and prior to exertion Avoid Allergens and Irritants Avoid secondhand smoke Avoid SICK contacts Recommend  Masking  when appropriate Recommend Keep up-to-date with vaccinations  Morbid obesity (HCC) Morbid obesity with associated sleep apnea. Healthy weight loss discussed    MEDICATION ADJUSTMENTS/LABS AND TESTS ORDERED: Continue CPAP Albuterol  as needed Recommend weight loss`   CURRENT MEDICATIONS REVIEWED AT LENGTH WITH PATIENT TODAY   Patient  satisfied with Plan of action and management. All questions answered   Follow up 6 months   I spent a total of 45 minutes dedicated to the care of this patient on the date of this encounter to include pre-visit review of records, face-to-face time with the patient discussing conditions above, post visit ordering of testing, clinical documentation with the electronic health record, making appropriate referrals as documented, and communicating necessary information to the patient's healthcare team.    The Patient requires high complexity decision making for assessment and support, frequent evaluation and titration of therapies, application of advanced monitoring technologies and extensive interpretation of multiple databases.  Patient satisfied with Plan of action and management. All questions answered    Nickolas Alm Cellar, M.D.  [facility]  Medical Director [facility] Medical Director [facility] Cardio-Pulmonary Department

## 2023-10-25 ENCOUNTER — Ambulatory Visit: Payer: Self-pay

## 2023-10-25 ENCOUNTER — Ambulatory Visit (INDEPENDENT_AMBULATORY_CARE_PROVIDER_SITE_OTHER)

## 2023-10-25 VITALS — BP 136/80 | HR 54 | Temp 97.8°F | Resp 20 | Ht 63.0 in | Wt 218.4 lb

## 2023-10-25 DIAGNOSIS — M545 Low back pain, unspecified: Secondary | ICD-10-CM | POA: Diagnosis not present

## 2023-10-25 DIAGNOSIS — R110 Nausea: Secondary | ICD-10-CM | POA: Diagnosis not present

## 2023-10-25 DIAGNOSIS — N1832 Chronic kidney disease, stage 3b: Secondary | ICD-10-CM | POA: Insufficient documentation

## 2023-10-25 DIAGNOSIS — K219 Gastro-esophageal reflux disease without esophagitis: Secondary | ICD-10-CM

## 2023-10-25 LAB — CBC WITH DIFFERENTIAL/PLATELET
Basophils Absolute: 0 K/uL (ref 0.0–0.1)
Basophils Relative: 0.6 % (ref 0.0–3.0)
Eosinophils Absolute: 0.2 K/uL (ref 0.0–0.7)
Eosinophils Relative: 4.4 % (ref 0.0–5.0)
HCT: 43.8 % (ref 36.0–46.0)
Hemoglobin: 14.6 g/dL (ref 12.0–15.0)
Lymphocytes Relative: 28 % (ref 12.0–46.0)
Lymphs Abs: 1.6 K/uL (ref 0.7–4.0)
MCHC: 33.2 g/dL (ref 30.0–36.0)
MCV: 98.8 fl (ref 78.0–100.0)
Monocytes Absolute: 0.7 K/uL (ref 0.1–1.0)
Monocytes Relative: 12.1 % — ABNORMAL HIGH (ref 3.0–12.0)
Neutro Abs: 3.1 K/uL (ref 1.4–7.7)
Neutrophils Relative %: 54.9 % (ref 43.0–77.0)
Platelets: 174 K/uL (ref 150.0–400.0)
RBC: 4.43 Mil/uL (ref 3.87–5.11)
RDW: 12.2 % (ref 11.5–15.5)
WBC: 5.6 K/uL (ref 4.0–10.5)

## 2023-10-25 LAB — POC URINALSYSI DIPSTICK (AUTOMATED)
Bilirubin, UA: NEGATIVE
Blood, UA: NEGATIVE
Glucose, UA: POSITIVE — AB
Ketones, UA: NEGATIVE
Leukocytes, UA: NEGATIVE
Nitrite, UA: NEGATIVE
Protein, UA: NEGATIVE
Spec Grav, UA: 1.015 (ref 1.010–1.025)
Urobilinogen, UA: 0.2 U/dL
pH, UA: 7.5 (ref 5.0–8.0)

## 2023-10-25 LAB — COMPREHENSIVE METABOLIC PANEL WITH GFR
ALT: 21 U/L (ref 0–35)
AST: 21 U/L (ref 0–37)
Albumin: 4.3 g/dL (ref 3.5–5.2)
Alkaline Phosphatase: 59 U/L (ref 39–117)
BUN: 15 mg/dL (ref 6–23)
CO2: 35 meq/L — ABNORMAL HIGH (ref 19–32)
Calcium: 9.7 mg/dL (ref 8.4–10.5)
Chloride: 100 meq/L (ref 96–112)
Creatinine, Ser: 1.4 mg/dL — ABNORMAL HIGH (ref 0.40–1.20)
GFR: 36.21 mL/min — ABNORMAL LOW (ref >60.00)
Glucose, Bld: 94 mg/dL (ref 70–99)
Potassium: 5 meq/L (ref 3.5–5.1)
Sodium: 139 meq/L (ref 135–145)
Total Bilirubin: 0.8 mg/dL (ref 0.2–1.2)
Total Protein: 6.7 g/dL (ref 6.0–8.3)

## 2023-10-25 MED ORDER — OMEPRAZOLE 40 MG PO CPDR: 40.0000 mg | DELAYED_RELEASE_CAPSULE | Freq: Two times a day (BID) | ORAL | 0 refills | Status: DC

## 2023-10-25 NOTE — Progress Notes (Signed)
 Please let the patient know her lab results from this morning shows slightly worsened kidney function from her baseline. It also shows she is dehydrated. Recommend increasing daily water intake to 40-50 oz per day. I also recommend patient establish care with a kidney doctor or nephrologist for chronic kidney disease. If patient is agreeable I will put a referral to nephrologist. If she would rather discuss this with her PCP that's okay. She also needs repeat kidney function in one week to make sure it's getting better.   Thank you,  Luke Shade, MD

## 2023-10-25 NOTE — Patient Instructions (Addendum)
 Take Omeprazole  40 mg, twice a day for one month then once a day daily. Please take this in an empty stomach for better absorption. Do not eat food for 30 min after taking Omeprazole .   Please hold off on taking iron  tablets for one month to see if that helps in improving nausea. Recommend more bland diet:  Include: Lean proteins (such as chicken, [place], and fish), low-fat dairy, refined grains (like white bread and rice), cooked vegetables, and soft fruits like bananas and applesauce. Avoid: Spicy foods, high-fat and fried foods, caffeinated and alcoholic beverages, raw vegetables, and whole grains.  Follow up with GI if symptoms persists.   I also recommend if you develop stomach pain, vomiting, fever, chills please go to the nearest ED.   I am also checking labs to see your kidney function, liver function is good. If this is abnormal I will reach out to you with recommendations.

## 2023-10-25 NOTE — Progress Notes (Signed)
 Acute Office Visit  Subjective:    Patient ID: Amanda Browning, female    DOB: 1946/01/15, 78 y.o.   MRN: [id number]  Chief Complaint  Patient presents with   Nausea    In the am but not enough to take medication X 2-3 months   Back Pain    Lower Right side X 2-3 months   Patient is in today for Discussed the use of AI scribe software for clinical note transcription with the patient, who gave verbal consent to proceed.  HPI History of Present Illness Amanda Browning is a 78 year old female who presents for following acute concerns:   - Nausea, GERD:  She has experienced persistent nausea for approximately two months, occurring mostly every morning upon waking up. She associates the onset of her symptoms with the treatment of a urinary tract infection with Augmentin , although she is unsure if the antibiotic is the cause back in 08/2023. No vomiting is reported, but she describes a sensation of bloating in her stomach. No recent changes in bowel habits, such as diarrhea or constipation,blood in stool, black tarry stool and no history of stomach surgeries. During the review of symptoms, no difficulty swallowing, vertigo, ringing in the ears, vision changes, chest pain, palpitations, fever, chills, or unintentional weight loss, urinary frequency, urgency, dysuria.   She experiences intermittent right back pain, particularly when engaging in strenuous activities such as sitting on a lawnmower for extended periods. The pain is not consistently associated with the nausea. She does not take any pain medication for her back pain and avoids ibuprofen due to previous advice.  Her past medical history includes a hiatal hernia for which she takes 40 mg of omeprazole  daily, typically in the morning on an empty stomach. She has undergone an endoscopy within the last year. She also has a history of atrial fibrillation for which she takes Eliquis .  She is also taking iron  supplement daily.    ROS As  per HPI    Objective:    BP 136/80   Pulse (!) 54   Temp 97.8 F (36.6 C)   Resp 20   Ht 5' 3 (1.6 m)   Wt 218 lb 6 oz (99.1 kg)   SpO2 97%   BMI 38.68 kg/m    Physical Exam Constitutional:      Appearance: Normal appearance. She is not ill-appearing.  HENT:     Head: Normocephalic and atraumatic.     Right Ear: Tympanic membrane normal.     Left Ear: Tympanic membrane normal.     Mouth/Throat:     Mouth: Mucous membranes are moist.  Neck:     Thyroid : No thyroid  mass or thyroid  tenderness.  Cardiovascular:     Rate and Rhythm: Normal rate.     Heart sounds: No murmur heard. Pulmonary:     Effort: Pulmonary effort is normal.     Breath sounds: Normal breath sounds.  Abdominal:     General: Abdomen is protuberant. Bowel sounds are normal.     Palpations: Abdomen is soft.     Tenderness: There is no abdominal tenderness. Negative signs include Murphy's sign.  Musculoskeletal:     Cervical back: Neck supple. No rigidity.     Right lower leg: No edema.     Left lower leg: No edema.  Skin:    General: Skin is warm.  Neurological:     Mental Status: She is alert and oriented to person, place, and time.  Psychiatric:  Mood and Affect: Mood normal.        Behavior: Behavior normal.     Results for orders placed or performed in visit on 10/25/23  POCT Urinalysis Dipstick (Automated)  Result Value Ref Range   Color, UA Yellow    Clarity, UA clear    Glucose, UA Positive (A) Negative   Bilirubin, UA Negative    Ketones, UA Negative    Spec Grav, UA 1.015 1.010 - 1.025   Blood, UA Negative    pH, UA 7.5 5.0 - 8.0   Protein, UA Negative Negative   Urobilinogen, UA 0.2 0.2 or 1.0 E.U./dL   Nitrite, UA Negative    Leukocytes, UA Negative Negative      Assessment & Plan:  Nausea Assessment & Plan: Possible GI etiology due to reflux, medication s/e and hiatal hernia. - Hold iron  supplements for one to two month. Keep symptom journal.  - Increase  omeprazole  from 40 mg  once daily to 40 mg twice daily for one month, then reduce to once daily dose.  - Consult GI specialist if symptoms persist.  - Adopt bland diet, avoid spicy, high-fat, fried foods. - Monitor for red flag symptoms and seek immediate medical care: vomiting, severe abdominal pain, fever, chills, blood in stool, black tarry stool. - Order labs: CBC, CMP.   Orders: -     CBC with Differential/Platelet -     Comprehensive metabolic panel with GFR  Gastroesophageal reflux disease, unspecified whether esophagitis present -     Omeprazole ; Take 1 capsule (40 mg total) by mouth in the morning and at bedtime.  Dispense: 120 capsule; Refill: 0  Right-sided low back pain without sciatica, unspecified chronicity Assessment & Plan: Chronic, suspect MSK related. POCT UA from today's visit reassuring, discussed result with the patient. Nausea likely not related to chronic low back pain. F/u with PCP if pain worsens.  Orders: -     POCT Urinalysis Dipstick (Automated)   I spent  40 minutes on the day of this face-to-face encounter reviewing the patient's medical and surgical history, current medications, ongoing concerns, and reviewing the assessment and plan with the patient. Additionally, I spent time post-visit ordering and reviewing diagnostics and therapeutics with the patient.  Return for Please keep your appointment with Dr. Glendia for 12/21/23.  Luke Shade, MD

## 2023-10-25 NOTE — Assessment & Plan Note (Signed)
 Possible GI etiology due to reflux, medication s/e and hiatal hernia. - Hold iron  supplements for one to two month. Keep symptom journal.  - Increase omeprazole  from 40 mg  once daily to 40 mg twice daily for one month, then reduce to once daily dose.  - Consult GI specialist if symptoms persist.  - Adopt bland diet, avoid spicy, high-fat, fried foods. - Monitor for red flag symptoms and seek immediate medical care: vomiting, severe abdominal pain, fever, chills, blood in stool, black tarry stool. - Order labs: CBC, CMP.

## 2023-10-25 NOTE — Progress Notes (Signed)
 Discussed during OV.  Jacklin Mascot, MD

## 2023-10-25 NOTE — Assessment & Plan Note (Addendum)
 Chronic, suspect MSK related. POCT UA from today's visit reassuring, discussed result with the patient. Nausea likely not related to chronic low back pain. F/u with PCP if pain worsens.

## 2023-10-25 NOTE — Progress Notes (Signed)
 Referral has been placed per provider and patient was made aware they will be reaching out to get her scheduled.

## 2023-10-26 ENCOUNTER — Other Ambulatory Visit: Payer: Self-pay | Admitting: Internal Medicine

## 2023-11-01 ENCOUNTER — Other Ambulatory Visit (HOSPITAL_BASED_OUTPATIENT_CLINIC_OR_DEPARTMENT_OTHER)

## 2023-11-01 NOTE — Progress Notes (Unsigned)
 11/02/2023 6:00 PM   Amanda Browning [birth year] [id number]  Referring provider: Glendia Shad, MD [address] [place],  [place] [address]  Urological history: 1. Urge incontinence/OAB -Gemtesa  75 mg daily  No chief complaint on file.  HPI: Amanda Browning is a 78 y.o. female who presents today for 12 month follow up.   Previous records reviewed.  Serum creatinine (927974) 1.40, eGFR 36.21  Hbg A1c (08/2023) 5.9   PMH: Past Medical History:  Diagnosis Date   Anemia    Chicken pox    Colon polyps    Complication of anesthesia    doesn't take much anesthesia to put pt to sleep, slow to wake up   DDD (degenerative disc disease), cervical 02/05/2013   C5-C6 and C6-C7   GERD (gastroesophageal reflux disease)    Heart murmur    History of atrial fibrillation    History of chicken pox    Hypercholesterolemia    Hyperlipidemia    Hypertension    Hypothyroidism    Migraines    PONV (postoperative nausea and vomiting)    Sleep apnea    Squamous cell skin cancer     Surgical History: Past Surgical History:  Procedure Laterality Date   BREAST BIOPSY Right 2012   stereotactic   CARDIOVERSION N/A 04/29/2021   Procedure: CARDIOVERSION;  Surgeon: Perla Evalene PARAS, MD;  Location: [facility] OR[facility];  Service: Cardiovascular;  Laterality: N/A;   CATARACT EXTRACTION W/PHACO Left 09/06/2021   Procedure: CATARACT EXTRACTION PHACO AND INTRAOCULAR LENS PLACEMENT (IOC) LEFT 6.60 00:43.8;  Surgeon: Myrna Adine Anes, MD;  Location: [facility];  Service: Ophthalmology;  Laterality: Left;   CATARACT EXTRACTION W/PHACO Right 10/04/2021   Procedure: CATARACT EXTRACTION PHACO AND INTRAOCULAR LENS PLACEMENT (IOC) RIGHT 6.08 00:45.0;  Surgeon: Myrna Adine Anes, MD;  Location: [facility];  Service: Ophthalmology;  Laterality: Right;   COLONOSCOPY N/A 03/23/2022   Procedure: COLONOSCOPY;  Surgeon: Toledo, Ladell MARLA, MD;  Location: [facility] ENDOSCOPY;   Service: Gastroenterology;  Laterality: N/A;   COLONOSCOPY WITH PROPOFOL  N/A 02/23/2018   Procedure: COLONOSCOPY WITH PROPOFOL ;  Surgeon: Viktoria Lamar DASEN, MD;  Location: [facility] ENDOSCOPY;  Service: Endoscopy;  Laterality: N/A;   ESOPHAGOGASTRODUODENOSCOPY N/A 03/15/2021   Procedure: ESOPHAGOGASTRODUODENOSCOPY (EGD);  Surgeon: Unk Corinn Skiff, MD;  Location: [facility] ENDOSCOPY;  Service: Gastroenterology;  Laterality: N/A;   ESOPHAGOGASTRODUODENOSCOPY N/A 03/23/2022   Procedure: ESOPHAGOGASTRODUODENOSCOPY (EGD);  Surgeon: Toledo, Ladell MARLA, MD;  Location: [facility] ENDOSCOPY;  Service: Gastroenterology;  Laterality: N/A;   KNEE ARTHROPLASTY Right 04/17/2017   Procedure: COMPUTER ASSISTED TOTAL KNEE ARTHROPLASTY;  Surgeon: Mardee Lynwood SQUIBB, MD;  Location: [facility] ORS;  Service: Orthopedics;  Laterality: Right;   SKIN BIOPSY     VAGINAL HYSTERECTOMY  1994   ovaries not removed   WISDOM TOOTH EXTRACTION      Home Medications:  Allergies as of 11/02/2023       Reactions   Codeine Other (See Comments)   Altered mental status- Cannot function        Medication List        Accurate as of November 01, 2023  6:00 PM. If you have any questions, ask your nurse or doctor.          acetaminophen  325 MG tablet Commonly known as: TYLENOL  Take 650 mg by mouth 4 (four) times daily as needed.   albuterol  108 (90 Base) MCG/ACT inhaler Commonly known as: VENTOLIN  HFA Inhale 2 puffs into the lungs every 6 (six) hours  as needed for wheezing or shortness of breath.   cetirizine  10 MG tablet Commonly known as: ZYRTEC  Take 10 mg by mouth every evening.   diltiazem  240 MG 24 hr capsule Commonly known as: CARDIZEM  CD TAKE 1 CAPSULE EVERY DAY   Eliquis  5 MG Tabs tablet Generic drug: apixaban  TAKE 1 TABLET TWICE DAILY   Farxiga  10 MG Tabs tablet Generic drug: dapagliflozin  propanediol TAKE 1 TABLET EVERY DAY BEFORE BREAKFAST   ferrous sulfate  325 (65 FE) MG tablet Take 325 mg by mouth in the  morning.   Gemtesa  75 MG Tabs Generic drug: Vibegron  TAKE 1 TABLET EVERY DAY   levothyroxine  50 MCG tablet Commonly known as: SYNTHROID  Take 1 tablet (50 mcg total) by mouth daily.   losartan  25 MG tablet Commonly known as: COZAAR  TAKE 1/2 TABLET EVERY MORNING   magnesium  oxide 400 MG tablet Commonly known as: MAG-OX Take 1 tablet (400 mg total) by mouth 2 (two) times daily.   metoprolol  tartrate 100 MG tablet Commonly known as: LOPRESSOR  TAKE 1 TABLET TWICE DAILY   multivitamin with minerals Tabs tablet Take 1 tablet by mouth in the morning.   omeprazole  40 MG capsule Commonly known as: PRILOSEC Take 1 capsule (40 mg total) by mouth in the morning and at bedtime.   potassium chloride  10 MEQ CR capsule Commonly known as: MICRO-K  Take 1 tablet (10 Meq) twice weekly with Torsemide . May take 1 extra tablet weekly as needed with Torsemide .   rosuvastatin  20 MG tablet Commonly known as: CRESTOR  TAKE 1 TABLET EVERY DAY   sertraline  50 MG tablet Commonly known as: ZOLOFT  TAKE 1 TABLET EVERY DAY   spironolactone  25 MG tablet Commonly known as: ALDACTONE  TAKE 1 TABLET EVERY DAY   torsemide  20 MG tablet Commonly known as: DEMADEX  Take Torsemide  1 tablet ( 20 MG) twice weekly. May take 1 extra tablet ( 20 MG ) once weekly as needed for shortness of breath.        Allergies:  Allergies  Allergen Reactions   Codeine Other (See Comments)    Altered mental status- Cannot function    Family History: Family History  Problem Relation Age of Onset   Heart disease Mother    Stroke Mother    Hypertension Mother    Heart attack Mother    Heart disease Father        enlarged heart   Stroke Father    Hypertension Father    Hyperlipidemia Father    Heart attack Father    Ovarian cancer Paternal Aunt    Breast cancer Sister 71   Cancer Sister        Breast Cancer   Diabetes type II Brother    Hypertension Brother    Colon cancer Neg Hx    Basal cell carcinoma  Neg Hx    Melanoma Neg Hx    Squamous cell carcinoma Neg Hx     Social History:  reports that she has never smoked. She has never been exposed to tobacco smoke. She has never used smokeless tobacco. She reports that she does not drink alcohol and does not use drugs.  ROS: Pertinent ROS in HPI  Physical Exam: There were no vitals taken for this visit.  Constitutional:  Well nourished. Alert and oriented, No acute distress. HEENT: [place] AT, moist mucus membranes.  Trachea midline, no masses. Cardiovascular: No clubbing, cyanosis, or edema. Respiratory: Normal respiratory effort, no increased work of breathing. GU: No CVA tenderness.  No bladder fullness or masses.  Recession of labia minora, dry, pale vulvar vaginal mucosa and loss of mucosal ridges and folds.  Normal urethral meatus, no lesions, no prolapse, no discharge.   No urethral masses, tenderness and/or tenderness. No bladder fullness, tenderness or masses. *** vagina mucosa, *** estrogen effect, no discharge, no lesions, *** pelvic support, *** cystocele and *** rectocele noted.  No cervical motion tenderness.  Uterus is freely mobile and non-fixed.  No adnexal/parametria masses or tenderness noted.  Anus and perineum are without rashes or lesions.   ***  Neurologic: Grossly intact, no focal deficits, moving all 4 extremities. Psychiatric: Normal mood and affect.      Laboratory Data See HPI and EPIC  I have reviewed the labs.   Pertinent Imaging: N/A  Assessment & Plan:    1. Urge incontinence/OAB - ***   No follow-ups on file.  These notes generated with voice recognition software. I apologize for typographical errors.  Amanda Browning  [facility] [address]  [address] [place], [place] [address] [phone]

## 2023-11-02 ENCOUNTER — Ambulatory Visit: Payer: Self-pay | Admitting: Urology

## 2023-11-02 ENCOUNTER — Other Ambulatory Visit

## 2023-11-02 ENCOUNTER — Encounter: Payer: Self-pay | Admitting: Urology

## 2023-11-02 ENCOUNTER — Other Ambulatory Visit (INDEPENDENT_AMBULATORY_CARE_PROVIDER_SITE_OTHER)

## 2023-11-02 VITALS — BP 117/71 | HR 76 | Ht 63.0 in | Wt 215.0 lb

## 2023-11-02 DIAGNOSIS — N1832 Chronic kidney disease, stage 3b: Secondary | ICD-10-CM | POA: Diagnosis not present

## 2023-11-02 DIAGNOSIS — N3281 Overactive bladder: Secondary | ICD-10-CM | POA: Diagnosis not present

## 2023-11-02 DIAGNOSIS — N3941 Urge incontinence: Secondary | ICD-10-CM

## 2023-11-02 LAB — BASIC METABOLIC PANEL WITH GFR
BUN: 14 mg/dL (ref 6–23)
CO2: 32 meq/L (ref 19–32)
Calcium: 9.8 mg/dL (ref 8.4–10.5)
Chloride: 103 meq/L (ref 96–112)
Creatinine, Ser: 1.32 mg/dL — ABNORMAL HIGH (ref 0.40–1.20)
GFR: 38.85 mL/min — ABNORMAL LOW (ref >60.00)
Glucose, Bld: 93 mg/dL (ref 70–99)
Potassium: 4.3 meq/L (ref 3.5–5.1)
Sodium: 143 meq/L (ref 135–145)

## 2023-11-02 MED ORDER — GEMTESA 75 MG PO TABS: 1.0000 | ORAL_TABLET | Freq: Every day | ORAL | 3 refills | Status: DC

## 2023-11-06 ENCOUNTER — Ambulatory Visit: Payer: Self-pay

## 2023-11-10 ENCOUNTER — Other Ambulatory Visit: Payer: Self-pay | Admitting: Internal Medicine

## 2023-11-15 ENCOUNTER — Other Ambulatory Visit (HOSPITAL_BASED_OUTPATIENT_CLINIC_OR_DEPARTMENT_OTHER)

## 2023-11-23 ENCOUNTER — Other Ambulatory Visit: Payer: Self-pay | Admitting: Nephrology

## 2023-11-23 DIAGNOSIS — N1832 Chronic kidney disease, stage 3b: Secondary | ICD-10-CM

## 2023-11-23 DIAGNOSIS — R8290 Unspecified abnormal findings in urine: Secondary | ICD-10-CM

## 2023-11-23 DIAGNOSIS — Z1159 Encounter for screening for other viral diseases: Secondary | ICD-10-CM | POA: Diagnosis not present

## 2023-11-23 DIAGNOSIS — E785 Hyperlipidemia, unspecified: Secondary | ICD-10-CM | POA: Diagnosis not present

## 2023-11-23 DIAGNOSIS — I129 Hypertensive chronic kidney disease with stage 1 through stage 4 chronic kidney disease, or unspecified chronic kidney disease: Secondary | ICD-10-CM | POA: Diagnosis not present

## 2023-11-30 ENCOUNTER — Ambulatory Visit
Admission: RE | Admit: 2023-11-30 | Discharge: 2023-11-30 | Disposition: A | Discharge status: Home / Self Care | Source: Ambulatory Visit | Attending: Nephrology | Admitting: Nephrology

## 2023-11-30 DIAGNOSIS — N189 Chronic kidney disease, unspecified: Secondary | ICD-10-CM | POA: Diagnosis not present

## 2023-11-30 DIAGNOSIS — N1832 Chronic kidney disease, stage 3b: Secondary | ICD-10-CM | POA: Insufficient documentation

## 2023-11-30 DIAGNOSIS — R8290 Unspecified abnormal findings in urine: Secondary | ICD-10-CM | POA: Diagnosis not present

## 2023-12-14 DIAGNOSIS — R8290 Unspecified abnormal findings in urine: Secondary | ICD-10-CM | POA: Diagnosis not present

## 2023-12-14 DIAGNOSIS — I129 Hypertensive chronic kidney disease with stage 1 through stage 4 chronic kidney disease, or unspecified chronic kidney disease: Secondary | ICD-10-CM | POA: Diagnosis not present

## 2023-12-14 DIAGNOSIS — N1832 Chronic kidney disease, stage 3b: Secondary | ICD-10-CM | POA: Diagnosis not present

## 2023-12-14 DIAGNOSIS — E785 Hyperlipidemia, unspecified: Secondary | ICD-10-CM | POA: Diagnosis not present

## 2023-12-16 ENCOUNTER — Other Ambulatory Visit: Payer: Self-pay | Admitting: Internal Medicine

## 2023-12-19 DIAGNOSIS — I129 Hypertensive chronic kidney disease with stage 1 through stage 4 chronic kidney disease, or unspecified chronic kidney disease: Secondary | ICD-10-CM | POA: Diagnosis not present

## 2023-12-19 DIAGNOSIS — E785 Hyperlipidemia, unspecified: Secondary | ICD-10-CM | POA: Diagnosis not present

## 2023-12-19 DIAGNOSIS — E875 Hyperkalemia: Secondary | ICD-10-CM | POA: Diagnosis not present

## 2023-12-19 DIAGNOSIS — N1832 Chronic kidney disease, stage 3b: Secondary | ICD-10-CM | POA: Diagnosis not present

## 2023-12-21 ENCOUNTER — Encounter: Payer: Self-pay | Admitting: Internal Medicine

## 2023-12-21 ENCOUNTER — Ambulatory Visit: Admitting: Internal Medicine

## 2023-12-21 VITALS — BP 112/68 | HR 60 | Resp 16 | Ht 63.0 in | Wt 220.0 lb

## 2023-12-21 DIAGNOSIS — E7800 Pure hypercholesterolemia, unspecified: Secondary | ICD-10-CM

## 2023-12-21 DIAGNOSIS — E039 Hypothyroidism, unspecified: Secondary | ICD-10-CM | POA: Diagnosis not present

## 2023-12-21 DIAGNOSIS — D509 Iron deficiency anemia, unspecified: Secondary | ICD-10-CM | POA: Diagnosis not present

## 2023-12-21 DIAGNOSIS — D692 Other nonthrombocytopenic purpura: Secondary | ICD-10-CM

## 2023-12-21 DIAGNOSIS — I10 Essential (primary) hypertension: Secondary | ICD-10-CM | POA: Diagnosis not present

## 2023-12-21 DIAGNOSIS — K449 Diaphragmatic hernia without obstruction or gangrene: Secondary | ICD-10-CM

## 2023-12-21 DIAGNOSIS — G4733 Obstructive sleep apnea (adult) (pediatric): Secondary | ICD-10-CM | POA: Diagnosis not present

## 2023-12-21 DIAGNOSIS — Z23 Encounter for immunization: Secondary | ICD-10-CM

## 2023-12-21 DIAGNOSIS — D649 Anemia, unspecified: Secondary | ICD-10-CM | POA: Diagnosis not present

## 2023-12-21 DIAGNOSIS — I4891 Unspecified atrial fibrillation: Secondary | ICD-10-CM

## 2023-12-21 DIAGNOSIS — N1832 Chronic kidney disease, stage 3b: Secondary | ICD-10-CM

## 2023-12-21 DIAGNOSIS — R739 Hyperglycemia, unspecified: Secondary | ICD-10-CM

## 2023-12-21 DIAGNOSIS — K2100 Gastro-esophageal reflux disease with esophagitis, without bleeding: Secondary | ICD-10-CM

## 2023-12-21 LAB — HEMOGLOBIN A1C: Hgb A1c MFr Bld: 6.3 % (ref 4.6–6.5)

## 2023-12-21 LAB — LIPID PANEL
Cholesterol: 170 mg/dL (ref 0–200)
HDL: 66 mg/dL (ref >39.00)
LDL Cholesterol: 78 mg/dL (ref 0–99)
NonHDL: 104.15
Total CHOL/HDL Ratio: 3
Triglycerides: 132 mg/dL (ref 0.0–149.0)
VLDL: 26.4 mg/dL (ref 0.0–40.0)

## 2023-12-21 LAB — IBC + FERRITIN
Ferritin: 66.5 ng/mL (ref 10.0–291.0)
Iron: 133 ug/dL (ref 42–145)
Saturation Ratios: 36.4 % (ref 20.0–50.0)
TIBC: 365.4 ug/dL (ref 250.0–450.0)
Transferrin: 261 mg/dL (ref 212.0–360.0)

## 2023-12-21 LAB — HEPATIC FUNCTION PANEL
ALT: 19 U/L (ref 0–35)
AST: 19 U/L (ref 0–37)
Albumin: 4.2 g/dL (ref 3.5–5.2)
Alkaline Phosphatase: 59 U/L (ref 39–117)
Bilirubin, Direct: 0.1 mg/dL (ref 0.0–0.3)
Total Bilirubin: 0.7 mg/dL (ref 0.2–1.2)
Total Protein: 6.5 g/dL (ref 6.0–8.3)

## 2023-12-21 LAB — TSH: TSH: 3.01 u[IU]/mL (ref 0.35–5.50)

## 2023-12-21 MED ORDER — OMEPRAZOLE 20 MG PO CPDR: DELAYED_RELEASE_CAPSULE | ORAL | 1 refills | Status: DC

## 2023-12-21 MED ORDER — ROSUVASTATIN CALCIUM 20 MG PO TABS: 20.0000 mg | ORAL_TABLET | Freq: Every day | ORAL | 3 refills | Status: AC

## 2023-12-21 NOTE — Assessment & Plan Note (Signed)
 CPAP.

## 2023-12-21 NOTE — Assessment & Plan Note (Signed)
 Off iron . Recent hgb wnl (12/14/23). Check iron  studies today.

## 2023-12-21 NOTE — Assessment & Plan Note (Signed)
 Recent nausea and abdominal bloating. Nausea is better. Still notices some bloating after eating. Has had two recent episodes - regurgitation of food. No vomiting. Instructed to avoid foods that aggravate. Nausea is better/resolved. Will decrease prilosec to 40mg  in am and 20mg  in pm. Follow. Hopefully can decrease further. Keep appt with GI. Discussed CT scan given persistent bloating. Wants to hold on further scanning at this time follow.

## 2023-12-21 NOTE — Assessment & Plan Note (Signed)
 Low carb diet and exercise. Follow met b and A1c.   Lab Results  Component Value Date   HGBA1C 5.9 (H) 08/18/2023

## 2023-12-21 NOTE — Addendum Note (Signed)
 Addended by: LEARTA PORTO D on: 12/21/2023 02:11 PM   Modules accepted: Orders

## 2023-12-21 NOTE — Assessment & Plan Note (Signed)
 Noticed on exam. On eliquis . Stable.

## 2023-12-21 NOTE — Assessment & Plan Note (Signed)
 On crestor .  Low cholesterol diet and exercise.  Check lipid panel today.

## 2023-12-21 NOTE — Assessment & Plan Note (Signed)
 On thyroid replacement.  Follow tsh.

## 2023-12-21 NOTE — Assessment & Plan Note (Signed)
 On eliquis .  Continues on diltiazem  and metoprolol .  Appears to be in SR today. Stable. No changes in medication today.

## 2023-12-21 NOTE — Assessment & Plan Note (Signed)
 Currently on cardizem  and lopressor . Also taking losartan  and aldactone .  Follow pressures. Follow metabolci panel.

## 2023-12-21 NOTE — Progress Notes (Signed)
 Subjective:    Patient ID: Amanda Browning, female    DOB: [birth year], 78 y.o.   MRN: [id number]  Patient here for  Chief Complaint  Patient presents with   Medical Management of Chronic Issues    HPI Here for a scheduled follow up - follow up regarding afib, hypertension and sleep apnea. Saw nephrology 12/19/23 - stable. Recently saw Dr Abbey. Was having issues with nausea and some bloating. Iron  supplement was held. Omeprazole  dose was adjusted. She is now taking 40mg  bid. Symptoms have improved. Still some bloating after eating. Bowels moving. Has had two episodes where she has regurgitated food. Denies dysphagia. She is scheduled to see GI. Breathing stable. Has not required torsemide .    Past Medical History:  Diagnosis Date   Anemia    Chicken pox    Colon polyps    Complication of anesthesia    doesn't take much anesthesia to put pt to sleep, slow to wake up   DDD (degenerative disc disease), cervical 02/05/2013   C5-C6 and C6-C7   GERD (gastroesophageal reflux disease)    Heart murmur    History of atrial fibrillation    History of chicken pox    Hypercholesterolemia    Hyperlipidemia    Hypertension    Hypothyroidism    Migraines    PONV (postoperative nausea and vomiting)    Sleep apnea    Squamous cell skin cancer    Past Surgical History:  Procedure Laterality Date   BREAST BIOPSY Right 2012   stereotactic   CARDIOVERSION N/A 04/29/2021   Procedure: CARDIOVERSION;  Surgeon: Perla Evalene PARAS, MD;  Location: [facility] OR[facility];  Service: Cardiovascular;  Laterality: N/A;   CATARACT EXTRACTION W/PHACO Left 09/06/2021   Procedure: CATARACT EXTRACTION PHACO AND INTRAOCULAR LENS PLACEMENT (IOC) LEFT 6.60 00:43.8;  Surgeon: Myrna Adine Anes, MD;  Location: [facility];  Service: Ophthalmology;  Laterality: Left;   CATARACT EXTRACTION W/PHACO Right 10/04/2021   Procedure: CATARACT EXTRACTION PHACO AND INTRAOCULAR LENS PLACEMENT (IOC) RIGHT 6.08 00:45.0;  Surgeon: Myrna Adine Anes, MD;  Location: [facility];  Service: Ophthalmology;  Laterality: Right;   COLONOSCOPY N/A 03/23/2022   Procedure: COLONOSCOPY;  Surgeon: Toledo, Ladell MARLA, MD;  Location: [facility] ENDOSCOPY;  Service: Gastroenterology;  Laterality: N/A;   COLONOSCOPY WITH PROPOFOL  N/A 02/23/2018   Procedure: COLONOSCOPY WITH PROPOFOL ;  Surgeon: Viktoria Lamar DASEN, MD;  Location: [facility] ENDOSCOPY;  Service: Endoscopy;  Laterality: N/A;   ESOPHAGOGASTRODUODENOSCOPY N/A 03/15/2021   Procedure: ESOPHAGOGASTRODUODENOSCOPY (EGD);  Surgeon: Unk Corinn Skiff, MD;  Location: [facility] END[facility]COPY;  Service: Gastroenterology;  Laterality: N/A;   ESOPHAGOGASTRODUODENOSCOPY N/A 03/23/2022   Procedure: ESOPHAGOGASTRODUODENOSCOPY (EGD);  Surgeon: Toledo, Ladell MARLA, MD;  Location: [facility] ENDOSCOPY;  Service: Gastroenterology;  Laterality: N/A;   KNEE ARTHROPLASTY Right 04/17/2017   Procedure: COMPUTER ASSISTED TOTAL KNEE ARTHROPLASTY;  Surgeon: Mardee Lynwood SQUIBB, MD;  Location: [facility] OR[facility];  Service: Orthopedics;  Laterality: Right;   SKIN BIOPSY     VAGINAL HYSTERECTOMY  1994   ovaries not removed   WISDOM TOOTH EXTRACTION     Family History  Problem Relation Age of Onset   Heart disease Mother    Stroke Mother    Hypertension Mother    Heart attack Mother    Heart disease Father        enlarged heart   Stroke Father    Hypertension Father    Hyperlipidemia Father    Heart attack Father    Ovarian cancer Paternal Aunt  Breast cancer Sister 61   Cancer Sister        Breast Cancer   Diabetes type II Brother    Hypertension Brother    Colon cancer Neg Hx    Basal cell carcinoma Neg Hx    Melanoma Neg Hx    Squamous cell carcinoma Neg Hx    Social History   Socioeconomic History   Marital status: Divorced    Spouse name: Not on file   Number of children: 2   Years of education: 12   Highest education level: High school graduate  Occupational History   Not on file  Tobacco Use   Smoking status:  Never    Passive exposure: Never   Smokeless tobacco: Never  Vaping Use   Vaping status: Never Used  Substance and Sexual Activity   Alcohol use: No    Alcohol/week: 0.0 standard drinks of alcohol   Drug use: No   Sexual activity: Never  Other Topics Concern   Not on file  Social History Narrative   Full Code   Alcohol - none   Never smoker and no smokeless tobacco   2 daughters   Social Drivers of Corporate investment banker Strain: Low Risk  (04/19/2021)   Received from [facility]   Overall Financial Resource Strain (CARDIA)    Difficulty of Paying Living Expenses: Not very hard  Food Insecurity: No Food Insecurity (04/19/2021)   Received from [facility]   Hunger Vital Sign    Within the past 12 months, you worried that your food would run out before you got the money to buy more.: Never true    Within the past 12 months, the food you bought just didn't last and you didn't have money to get more.: Never true  Transportation Needs: No Transportation Needs (04/19/2021)   Received from [facility]   PRAPARE - Transportation    Lack of Transportation (Medical): No    Lack of Transportation (Non-Medical): No  Physical Activity: Insufficiently Active (07/28/2017)   Exercise Vital Sign    Days of Exercise per Week: 4 days    Minutes of Exercise per Session: 30 min  Stress: No Stress Concern Present (07/28/2017)   [employer] of Occupational Health - Occupational Stress Questionnaire    Feeling of Stress : Not at all  Social Connections: Not on file     Review of Systems  Constitutional:  Negative for appetite change and unexpected weight change.  HENT:  Negative for congestion and sinus pressure.   Respiratory:  Negative for cough, chest tightness and shortness of breath.   Cardiovascular:  Negative for chest pain and palpitations.       No increased leg swelling.   Gastrointestinal:  Positive for nausea. Negative for vomiting.       Abdominal bloating as  outlined.   Genitourinary:  Negative for difficulty urinating and dysuria.  Musculoskeletal:  Negative for joint swelling and myalgias.  Skin:  Negative for color change and rash.  Neurological:  Negative for dizziness and headaches.  Psychiatric/Behavioral:  Negative for agitation and dysphoric mood.        Objective:     BP 112/68   Pulse 60   Resp 16   Ht 5' 3 (1.6 m)   Wt 220 lb (99.8 kg)   SpO2 96%   BMI 38.97 kg/m  Wt Readings from Last 3 Encounters:  12/21/23 220 lb (99.8 kg)  11/02/23 215 lb (97.5 kg)  10/25/23 218 lb 6 oz (99.1 kg)    Physical Exam Vitals reviewed.  Constitutional:      General: She is not in acute distress.    Appearance: Normal appearance.  HENT:     Head: Normocephalic and atraumatic.     Right Ear: External ear normal.     Left Ear: External ear normal.     Mouth/Throat:     Pharynx: No oropharyngeal exudate or posterior oropharyngeal erythema.  Eyes:     General: No scleral icterus.       Right eye: No discharge.        Left eye: No discharge.     Conjunctiva/sclera: Conjunctivae normal.  Neck:     Thyroid : No thyromegaly.  Cardiovascular:     Rate and Rhythm: Normal rate and regular rhythm.  Pulmonary:     Effort: No respiratory distress.     Breath sounds: Normal breath sounds. No wheezing.  Abdominal:     General: Bowel sounds are normal.     Palpations: Abdomen is soft.     Tenderness: There is no abdominal tenderness.  Musculoskeletal:        General: No swelling or tenderness.     Cervical back: Neck supple. No tenderness.  Lymphadenopathy:     Cervical: No cervical adenopathy.  Skin:    Findings: No erythema or rash.  Neurological:     Mental Status: She is alert.  Psychiatric:        Mood and Affect: Mood normal.        Behavior: Behavior normal.         Outpatient Encounter Medications as of 12/21/2023  Medication Sig   omeprazole  (PRILOSEC) 20 MG capsule Take 2 capusles in the am and 1 capsule in the pm.    acetaminophen  (TYLENOL ) 325 MG tablet Take 650 mg by mouth 4 (four) times daily as needed.   albuterol  (VENTOLIN  HFA) 108 (90 Base) MCG/ACT inhaler Inhale 2 puffs into the lungs every 6 (six) hours as needed for wheezing or shortness of breath.   cetirizine  (ZYRTEC ) 10 MG tablet Take 10 mg by mouth every evening.   diltiazem  (CARDIZEM  CD) 240 MG 24 hr capsule TAKE 1 CAPSULE EVERY DAY   ELIQUIS  5 MG TABS tablet TAKE 1 TABLET TWICE DAILY   FARXIGA  10 MG TABS tablet TAKE 1 TABLET EVERY DAY BEFORE BREAKFAST   levothyroxine  (SYNTHROID ) 50 MCG tablet TAKE 1 TABLET EVERY DAY   losartan  (COZAAR ) 25 MG tablet TAKE 1/2 TABLET EVERY MORNING   magnesium  oxide (MAG-OX) 400 MG tablet Take 1 tablet (400 mg total) by mouth 2 (two) times daily.   metoprolol  tartrate (LOPRESSOR ) 100 MG tablet TAKE 1 TABLET TWICE DAILY   Multiple Vitamin (MULTIVITAMIN WITH MINERALS) TABS tablet Take 1 tablet by mouth in the morning.   potassium chloride  (MICRO-K ) 10 MEQ CR capsule Take 1 tablet (10 Meq) twice weekly with Torsemide . May take 1 extra tablet weekly as needed with Torsemide .   rosuvastatin  (CRESTOR ) 20 MG tablet Take 1 tablet (20 mg total) by mouth daily.   sertraline  (ZOLOFT ) 50 MG tablet TAKE 1 TABLET EVERY DAY   spironolactone  (ALDACTONE ) 25 MG tablet TAKE 1 TABLET EVERY DAY   torsemide  (DEMADEX ) 20 MG tablet Take Torsemide  1 tablet ( 20 MG) twice weekly. May take 1 extra tablet ( 20 MG ) once weekly as needed for shortness of breath.   Vibegron  (GEMTESA ) 75 MG TABS Take 1 tablet (75 mg total) by mouth daily.   [  DISCONTINUED] omeprazole  (PRILOSEC) 40 MG capsule Take 1 capsule (40 mg total) by mouth in the morning and at bedtime.   [DISCONTINUED] rosuvastatin  (CRESTOR ) 20 MG tablet TAKE 1 TABLET EVERY DAY   No facility-administered encounter medications on file as of 12/21/2023.     Lab Results  Component Value Date   WBC 5.6 10/25/2023   HGB 14.6 10/25/2023   HCT 43.8 10/25/2023   PLT 174.0 10/25/2023    GLUCOSE 93 11/02/2023   CHOL 187 08/18/2023   TRIG 112 08/18/2023   HDL 72 08/18/2023   LDLDIRECT 131.0 03/19/2013   LDLCALC 94 08/18/2023   ALT 21 10/25/2023   AST 21 10/25/2023   NA 143 11/02/2023   K 4.3 11/02/2023   CL 103 11/02/2023   CREATININE 1.32 (H) 11/02/2023   BUN 14 11/02/2023   CO2 32 11/02/2023   TSH 4.11 10/05/2023   INR 1.2 03/12/2021   HGBA1C 5.9 (H) 08/18/2023    US  RENAL Result Date: 12/14/2023 CLINICAL DATA:  Chronic kidney disease EXAM: RENAL / URINARY TRACT ULTRASOUND COMPLETE COMPARISON:  CT 03/12/2021 FINDINGS: Right Kidney: Renal measurements: 8.4 x 4 x 2.8 cm = volume: 50.4 mL. Echogenicity appears within normal limits. No hydronephrosis. Small cyst at the lower pole measuring 19 mm, no specific imaging follow-up is recommended. Left Kidney: Renal measurements: 9.3 x 5.3 x 4.1 cm = volume: 105.2 mL. Echogenicity is normal. Large exophytic cyst off the upper pole measuring 9 x 8 x 7.4 cm, no specific imaging follow-up is recommended. Bladder: Appears normal for degree of bladder distention. Other: None. IMPRESSION: Negative for hydronephrosis. Electronically Signed   By: Luke Bun M.D.   On: 12/14/2023 20:12       Assessment & Plan:  Atrial fibrillation, unspecified type [facility]) Assessment & Plan: On eliquis .  Continues on diltiazem  and metoprolol .  Appears to be in SR today. Stable. No changes in medication today.    Hypercholesterolemia Assessment & Plan: On crestor .  Low cholesterol diet and exercise.  Check lipid panel today.   Orders: -     Hepatic function panel -     Lipid panel  Hyperglycemia Assessment & Plan: Low carb diet and exercise. Follow met b and A1c.  Lab Results  Component Value Date   HGBA1C 5.9 (H) 08/18/2023     Orders: -     Hemoglobin A1c  Primary hypertension Assessment & Plan: Currently on cardizem  and lopressor . Also taking losartan  and aldactone .  Follow pressures. Follow metabolci panel.    Hypothyroidism,  unspecified type Assessment & Plan: On thyroid  replacement. Follow tsh.   Orders: -     TSH  Anemia, unspecified type -     IBC + Ferritin  Stage 3b chronic kidney disease (HCC) Assessment & Plan: Seeing nephrology. Felt stable. No changes. Continue farxiga  and losartan .    OSA (obstructive sleep apnea) Assessment & Plan: CPAP.    Iron  deficiency anemia, unspecified iron  deficiency anemia type Assessment & Plan: Off iron . Recent hgb wnl (12/14/23). Check iron  studies today.    Hiatal hernia with GERD and esophagitis Assessment & Plan: Recent nausea and abdominal bloating. Nausea is better. Still notices some bloating after eating. Has had two recent episodes - regurgitation of food. No vomiting. Instructed to avoid foods that aggravate. Nausea is better/resolved. Will decrease prilosec to 40mg  in am and 20mg  in pm. Follow. Hopefully can decrease further. Keep appt with GI. Discussed CT scan given persistent bloating. Wants to hold on further scanning at this time  follow.    Senile purpura (HCC) Assessment & Plan: Noticed on exam. On eliquis . Stable.    Other orders -     Rosuvastatin  Calcium ; Take 1 tablet (20 mg total) by mouth daily.  Dispense: 90 tablet; Refill: 3 -     Omeprazole ; Take 2 capusles in the am and 1 capsule in the pm.  Dispense: 270 capsule; Refill: 1     Allena Hamilton, MD

## 2023-12-21 NOTE — Assessment & Plan Note (Signed)
 Seeing nephrology. Felt stable. No changes. Continue farxiga  and losartan .

## 2023-12-22 ENCOUNTER — Ambulatory Visit: Payer: Self-pay | Admitting: Internal Medicine

## 2024-01-15 ENCOUNTER — Other Ambulatory Visit: Payer: Self-pay | Admitting: Urology

## 2024-01-15 ENCOUNTER — Other Ambulatory Visit: Payer: Self-pay | Admitting: Internal Medicine

## 2024-01-15 DIAGNOSIS — N3941 Urge incontinence: Secondary | ICD-10-CM

## 2024-01-15 DIAGNOSIS — N3281 Overactive bladder: Secondary | ICD-10-CM

## 2024-02-07 ENCOUNTER — Other Ambulatory Visit: Payer: Self-pay | Admitting: Internal Medicine

## 2024-02-15 DIAGNOSIS — K219 Gastro-esophageal reflux disease without esophagitis: Secondary | ICD-10-CM | POA: Diagnosis not present

## 2024-02-15 DIAGNOSIS — H52223 Regular astigmatism, bilateral: Secondary | ICD-10-CM | POA: Diagnosis not present

## 2024-02-15 DIAGNOSIS — K529 Noninfective gastroenteritis and colitis, unspecified: Secondary | ICD-10-CM | POA: Diagnosis not present

## 2024-02-15 DIAGNOSIS — I10 Essential (primary) hypertension: Secondary | ICD-10-CM | POA: Diagnosis not present

## 2024-02-15 DIAGNOSIS — H26493 Other secondary cataract, bilateral: Secondary | ICD-10-CM | POA: Diagnosis not present

## 2024-02-15 DIAGNOSIS — K449 Diaphragmatic hernia without obstruction or gangrene: Secondary | ICD-10-CM | POA: Diagnosis not present

## 2024-02-15 DIAGNOSIS — Z860101 Personal history of adenomatous and serrated colon polyps: Secondary | ICD-10-CM | POA: Diagnosis not present

## 2024-02-15 DIAGNOSIS — H524 Presbyopia: Secondary | ICD-10-CM | POA: Diagnosis not present

## 2024-02-15 DIAGNOSIS — H5213 Myopia, bilateral: Secondary | ICD-10-CM | POA: Diagnosis not present

## 2024-02-15 DIAGNOSIS — H35033 Hypertensive retinopathy, bilateral: Secondary | ICD-10-CM | POA: Diagnosis not present

## 2024-02-15 DIAGNOSIS — H43813 Vitreous degeneration, bilateral: Secondary | ICD-10-CM | POA: Diagnosis not present

## 2024-03-09 DIAGNOSIS — I2510 Atherosclerotic heart disease of native coronary artery without angina pectoris: Secondary | ICD-10-CM | POA: Diagnosis not present

## 2024-03-09 DIAGNOSIS — I509 Heart failure, unspecified: Secondary | ICD-10-CM | POA: Diagnosis not present

## 2024-03-09 DIAGNOSIS — I130 Hypertensive heart and chronic kidney disease with heart failure and stage 1 through stage 4 chronic kidney disease, or unspecified chronic kidney disease: Secondary | ICD-10-CM | POA: Diagnosis not present

## 2024-03-09 DIAGNOSIS — Z6838 Body mass index (BMI) 38.0-38.9, adult: Secondary | ICD-10-CM | POA: Diagnosis not present

## 2024-03-09 DIAGNOSIS — N3941 Urge incontinence: Secondary | ICD-10-CM | POA: Diagnosis not present

## 2024-03-09 DIAGNOSIS — E039 Hypothyroidism, unspecified: Secondary | ICD-10-CM | POA: Diagnosis not present

## 2024-03-09 DIAGNOSIS — I429 Cardiomyopathy, unspecified: Secondary | ICD-10-CM | POA: Diagnosis not present

## 2024-03-09 DIAGNOSIS — Z823 Family history of stroke: Secondary | ICD-10-CM | POA: Diagnosis not present

## 2024-03-09 DIAGNOSIS — Z7901 Long term (current) use of anticoagulants: Secondary | ICD-10-CM | POA: Diagnosis not present

## 2024-03-09 DIAGNOSIS — E785 Hyperlipidemia, unspecified: Secondary | ICD-10-CM | POA: Diagnosis not present

## 2024-03-09 DIAGNOSIS — Z79890 Hormone replacement therapy (postmenopausal): Secondary | ICD-10-CM | POA: Diagnosis not present

## 2024-03-09 DIAGNOSIS — N1832 Chronic kidney disease, stage 3b: Secondary | ICD-10-CM | POA: Diagnosis not present

## 2024-03-09 DIAGNOSIS — K219 Gastro-esophageal reflux disease without esophagitis: Secondary | ICD-10-CM | POA: Diagnosis not present

## 2024-03-09 DIAGNOSIS — I4891 Unspecified atrial fibrillation: Secondary | ICD-10-CM | POA: Diagnosis not present

## 2024-03-19 DIAGNOSIS — N1832 Chronic kidney disease, stage 3b: Secondary | ICD-10-CM | POA: Diagnosis not present

## 2024-03-19 DIAGNOSIS — E875 Hyperkalemia: Secondary | ICD-10-CM | POA: Diagnosis not present

## 2024-03-19 DIAGNOSIS — R809 Proteinuria, unspecified: Secondary | ICD-10-CM | POA: Diagnosis not present

## 2024-03-19 DIAGNOSIS — I129 Hypertensive chronic kidney disease with stage 1 through stage 4 chronic kidney disease, or unspecified chronic kidney disease: Secondary | ICD-10-CM | POA: Diagnosis not present

## 2024-03-19 DIAGNOSIS — E785 Hyperlipidemia, unspecified: Secondary | ICD-10-CM | POA: Diagnosis not present

## 2024-03-22 ENCOUNTER — Other Ambulatory Visit: Payer: Self-pay | Admitting: Internal Medicine

## 2024-03-22 NOTE — Telephone Encounter (Signed)
 completed

## 2024-03-28 ENCOUNTER — Ambulatory Visit: Admitting: Internal Medicine

## 2024-03-28 VITALS — BP 120/78 | HR 68 | Temp 98.5°F | Ht 63.0 in | Wt 216.6 lb

## 2024-03-28 DIAGNOSIS — N3281 Overactive bladder: Secondary | ICD-10-CM | POA: Diagnosis not present

## 2024-03-28 DIAGNOSIS — N1832 Chronic kidney disease, stage 3b: Secondary | ICD-10-CM

## 2024-03-28 DIAGNOSIS — K219 Gastro-esophageal reflux disease without esophagitis: Secondary | ICD-10-CM

## 2024-03-28 DIAGNOSIS — E7800 Pure hypercholesterolemia, unspecified: Secondary | ICD-10-CM | POA: Diagnosis not present

## 2024-03-28 DIAGNOSIS — K449 Diaphragmatic hernia without obstruction or gangrene: Secondary | ICD-10-CM

## 2024-03-28 DIAGNOSIS — I4891 Unspecified atrial fibrillation: Secondary | ICD-10-CM

## 2024-03-28 DIAGNOSIS — K2100 Gastro-esophageal reflux disease with esophagitis, without bleeding: Secondary | ICD-10-CM

## 2024-03-28 DIAGNOSIS — I10 Essential (primary) hypertension: Secondary | ICD-10-CM

## 2024-03-28 DIAGNOSIS — R739 Hyperglycemia, unspecified: Secondary | ICD-10-CM | POA: Diagnosis not present

## 2024-03-28 DIAGNOSIS — F439 Reaction to severe stress, unspecified: Secondary | ICD-10-CM

## 2024-03-28 DIAGNOSIS — G4733 Obstructive sleep apnea (adult) (pediatric): Secondary | ICD-10-CM

## 2024-03-28 DIAGNOSIS — Z0000 Encounter for general adult medical examination without abnormal findings: Secondary | ICD-10-CM

## 2024-03-28 DIAGNOSIS — E039 Hypothyroidism, unspecified: Secondary | ICD-10-CM

## 2024-03-28 DIAGNOSIS — Z1231 Encounter for screening mammogram for malignant neoplasm of breast: Secondary | ICD-10-CM

## 2024-03-28 LAB — LIPID PANEL
Cholesterol: 192 mg/dL (ref 0–200)
HDL: 70.8 mg/dL (ref >39.00)
LDL Cholesterol: 98 mg/dL (ref 0–99)
NonHDL: 121.59
Total CHOL/HDL Ratio: 3
Triglycerides: 117 mg/dL (ref 0.0–149.0)
VLDL: 23.4 mg/dL (ref 0.0–40.0)

## 2024-03-28 LAB — HEPATIC FUNCTION PANEL
ALT: 20 U/L (ref 0–35)
AST: 22 U/L (ref 0–37)
Albumin: 4.5 g/dL (ref 3.5–5.2)
Alkaline Phosphatase: 71 U/L (ref 39–117)
Bilirubin, Direct: 0.2 mg/dL (ref 0.0–0.3)
Total Bilirubin: 0.8 mg/dL (ref 0.2–1.2)
Total Protein: 6.9 g/dL (ref 6.0–8.3)

## 2024-03-28 LAB — BASIC METABOLIC PANEL WITH GFR
BUN: 25 mg/dL — ABNORMAL HIGH (ref 6–23)
CO2: 36 meq/L — ABNORMAL HIGH (ref 19–32)
Calcium: 10 mg/dL (ref 8.4–10.5)
Chloride: 98 meq/L (ref 96–112)
Creatinine, Ser: 1.6 mg/dL — ABNORMAL HIGH (ref 0.40–1.20)
GFR: 30.76 mL/min — ABNORMAL LOW (ref >60.00)
Glucose, Bld: 93 mg/dL (ref 70–99)
Potassium: 4.7 meq/L (ref 3.5–5.1)
Sodium: 142 meq/L (ref 135–145)

## 2024-03-28 LAB — HEMOGLOBIN A1C: Hgb A1c MFr Bld: 5.6 % (ref 4.6–6.5)

## 2024-03-28 MED ORDER — OZEMPIC (0.25 OR 0.5 MG/DOSE) 2 MG/3ML ~~LOC~~ SOPN: 0.2500 mg | PEN_INJECTOR | SUBCUTANEOUS | 1 refills | Status: AC

## 2024-03-28 NOTE — Assessment & Plan Note (Signed)
 Physical today 03/28/24. Mammogram 04/20/22 - Birads 0. F/u right breast mammogram 05/04/22 - Birads II. Recommend continue annual screening. Mammogram 07/06/23 - Birads I. Colonoscopy 03/23/22 - Diverticulosis in the sigmoid colon. Non-bleeding internal hemorrhoids. Stool in the transverse colon, in the ascending colon and in the cecum. Pathology - negative microscopic colitis. Repeat colonoscopy one year. Declined.

## 2024-03-28 NOTE — Progress Notes (Unsigned)
 Subjective:    Patient ID: Amanda Browning, female    DOB: 04-Jan-1946, 78 y.o.   MRN: [id number]  Patient here for  Chief Complaint  Patient presents with   Medical Management of Chronic Issues   Annual Exam    HPI Here for a physical exam. Saw nephrology 03/19/24 - f/u CKD 3b- continue losartan , dapagliflozin  and spironolactone . Follow up with Dr Aundria 02/15/24 - f/u GERD - recommended omeprazoloe 40mg  bid. Continues on eliquis  - afib. Continues cpap - OSA.    Past Medical History:  Diagnosis Date   Anemia    Chicken pox    Colon polyps    Complication of anesthesia    doesn't take much anesthesia to put pt to sleep, slow to wake up   DDD (degenerative disc disease), cervical 02/05/2013   C5-C6 and C6-C7   GERD (gastroesophageal reflux disease)    Heart murmur    History of atrial fibrillation    History of chicken pox    Hypercholesterolemia    Hyperlipidemia    Hypertension    Hypothyroidism    Migraines    PONV (postoperative nausea and vomiting)    Sleep apnea    Squamous cell skin cancer    Past Surgical History:  Procedure Laterality Date   BREAST BIOPSY Right 2012   stereotactic   CARDIOVERSION N/A 04/29/2021   Procedure: CARDIOVERSION;  Surgeon: Perla Evalene PARAS, MD;  Location: [facility] ORS;  Service: Cardiovascular;  Laterality: N/A;   CATARACT EXTRACTION W/PHACO Left 09/06/2021   Procedure: CATARACT EXTRACTION PHACO AND INTRAOCULAR LENS PLACEMENT (IOC) LEFT 6.60 00:43.8;  Surgeon: Myrna Adine Anes, MD;  Location: [facility];  Service: Ophthalmology;  Laterality: Left;   CATARACT EXTRACTION W/PHACO Right 10/04/2021   Procedure: CATARACT EXTRACTION PHACO AND INTRAOCULAR LENS PLACEMENT (IOC) RIGHT 6.08 00:45.0;  Surgeon: Myrna Adine Anes, MD;  Location: [facility];  Service: Ophthalmology;  Laterality: Right;   COLONOSCOPY N/A 03/23/2022   Procedure: COLONOSCOPY;  Surgeon: Toledo, Ladell MARLA, MD;  Location: [facility] ENDOSCOPY;  Service:  Gastroenterology;  Laterality: N/A;   COLONOSCOPY WITH PROPOFOL  N/A 02/23/2018   Procedure: COLONOSCOPY WITH PROPOFOL ;  Surgeon: Viktoria Lamar DASEN, MD;  Location: [facility] ENDOSCOPY;  Service: Endoscopy;  Laterality: N/A;   ESOPHAGOGASTRODUODENOSCOPY N/A 03/15/2021   Procedure: ESOPHAGOGASTRODUODENOSCOPY (EGD);  Surgeon: Unk Corinn Skiff, MD;  Location: [facility] ENDOSCOPY;  Service: Gastroenterology;  Laterality: N/A;   ESOPHAGOGASTRODUODENOSCOPY N/A 03/23/2022   Procedure: ESOPHAGOGASTRODUODENOSCOPY (EGD);  Surgeon: Toledo, Ladell MARLA, MD;  Location: [facility] ENDOSCOPY;  Service: Gastroenterology;  Laterality: N/A;   KNEE ARTHROPLASTY Right 04/17/2017   Procedure: COMPUTER ASSISTED TOTAL KNEE ARTHROPLASTY;  Surgeon: Mardee Lynwood SQUIBB, MD;  Location: [facility] ORS;  Service: Orthopedics;  Laterality: Right;   SKIN BIOPSY     VAGINAL HYSTERECTOMY  1994   ovaries not removed   WISDOM TOOTH EXTRACTION     Family History  Problem Relation Age of Onset   Heart disease Mother    Stroke Mother    Hypertension Mother    Heart attack Mother    Heart disease Father        enlarged heart   Stroke Father    Hypertension Father    Hyperlipidemia Father    Heart attack Father    Ovarian cancer Paternal Aunt    Breast cancer Sister 50   Cancer Sister        Breast Cancer   Diabetes type II Brother    Hypertension Brother    Colon cancer  Neg Hx    Basal cell carcinoma Neg Hx    Melanoma Neg Hx    Squamous cell carcinoma Neg Hx    Social History   Socioeconomic History   Marital status: Divorced    Spouse name: Not on file   Number of children: 2   Years of education: 12   Highest education level: High school graduate  Occupational History   Not on file  Tobacco Use   Smoking status: Never    Passive exposure: Never   Smokeless tobacco: Never  Vaping Use   Vaping status: Never Used  Substance and Sexual Activity   Alcohol use: No    Alcohol/week: 0.0 standard drinks of alcohol   Drug use: No    Sexual activity: Never  Other Topics Concern   Not on file  Social History Narrative   Full Code   Alcohol - none   Never smoker and no smokeless tobacco   2 daughters   Social Drivers of Health   Tobacco Use: Low Risk (12/21/2023)   Patient History    Smoking Tobacco Use: Never    Smokeless Tobacco Use: Never    Passive Exposure: Never  Financial Resource Strain: Low Risk (04/19/2021)   Received from [facility]   Overall Financial Resource Strain (CARDIA)    Difficulty of Paying Living Expenses: Not very hard  Food Insecurity: No Food Insecurity (04/19/2021)   Received [facility]   Epic    Within the past 12 months, you worried that your food would run out before you got the money to buy more.: Never true    Within the past 12 months, the food you bought just didn't last and you didn't have money to get more.: Never true  Transportation Needs: No Transportation Needs (04/19/2021)   Received from [facility]   PRAPARE - Transportation    Lack of Transportation (Medical): No    Lack of Transportation (Non-Medical): No  Physical Activity: Not on file  Stress: Not on file  Social Connections: Not on file  Depression (PHQ2-9): Low Risk (12/21/2023)   Depression (PHQ2-9)    PHQ-2 Score: 0  Alcohol Screen: Not on file  Housing: Unknown (08/10/2023)   Received from [facility]   Epic    Unable to Pay for Housing in the Last Year: Not on file    Number of Times Moved in the Last Year: Not on file    At any time in the past 12 months, were you homeless or living in a shelter (including now)?: No  Utilities: Not on file  Health Literacy: Not on file     Review of Systems     Objective:     There were no vitals taken for this visit. Wt Readings from Last 3 Encounters:  12/21/23 220 lb (99.8 kg)  11/02/23 215 lb (97.5 kg)  10/25/23 218 lb 6 oz (99.1 kg)    Physical Exam  {Perform Simple Foot Exam  Perform Detailed exam:1} {Insert foot  Exam (Optional):30965}   Outpatient Encounter Medications as of 03/28/2024  Medication Sig   acetaminophen  (TYLENOL ) 325 MG tablet Take 650 mg by mouth 4 (four) times daily as needed.   albuterol  (VENTOLIN  HFA) 108 (90 Base) MCG/ACT inhaler Inhale 2 puffs into the lungs every 6 (six) hours as needed for wheezing or shortness of breath.   cetirizine  (ZYRTEC ) 10 MG tablet Take 10 mg by mouth every evening.   diltiazem  (CARDIZEM  CD) 240  MG 24 hr capsule TAKE 1 CAPSULE EVERY DAY   ELIQUIS  5 MG TABS tablet TAKE 1 TABLET TWICE DAILY   GEMTESA  75 MG TABS TAKE 1 TABLET EVERY DAY   levothyroxine  (SYNTHROID ) 50 MCG tablet TAKE 1 TABLET EVERY DAY   losartan  (COZAAR ) 25 MG tablet TAKE 1/2 TABLET EVERY MORNING   magnesium  oxide (MAG-OX) 400 MG tablet Take 1 tablet (400 mg total) by mouth 2 (two) times daily.   metoprolol  tartrate (LOPRESSOR ) 100 MG tablet TAKE 1 TABLET TWICE DAILY   Multiple Vitamin (MULTIVITAMIN WITH MINERALS) TABS tablet Take 1 tablet by mouth in the morning.   omeprazole  (PRILOSEC) 40 MG capsule Take 40 mg by mouth.   potassium chloride  (MICRO-K ) 10 MEQ CR capsule Take 1 tablet (10 Meq) twice weekly with Torsemide . May take 1 extra tablet weekly as needed with Torsemide .   rosuvastatin  (CRESTOR ) 20 MG tablet Take 1 tablet (20 mg total) by mouth daily.   Semaglutide,0.25 or 0.5MG /DOS, (OZEMPIC, 0.25 OR 0.5 MG/DOSE,) 2 MG/3ML SOPN Inject 0.25 mg into the skin once a week.   sertraline  (ZOLOFT ) 50 MG tablet TAKE 1 TABLET EVERY DAY   spironolactone  (ALDACTONE ) 25 MG tablet TAKE 1 TABLET EVERY DAY   torsemide  (DEMADEX ) 20 MG tablet Take Torsemide  1 tablet ( 20 MG) twice weekly. May take 1 extra tablet ( 20 MG ) once weekly as needed for shortness of breath.   dapagliflozin  propanediol (FARXIGA ) 10 MG TABS tablet Take 1 tablet (10 mg total) by mouth daily.   omeprazole  (PRILOSEC) 20 MG capsule Take 2 capusles in the am and 1 capsule in the pm.   [DISCONTINUED] dapagliflozin  propanediol  (FARXIGA ) 10 MG TABS tablet TAKE 1 TABLET EVERY DAY BEFORE BREAKFAST   [DISCONTINUED] omeprazole  (PRILOSEC) 20 MG capsule Take 2 capusles in the am and 1 capsule in the pm.   No facility-administered encounter medications on file as of 03/28/2024.     Lab Results  Component Value Date   WBC 5.6 10/25/2023   HGB 14.6 10/25/2023   HCT 43.8 10/25/2023   PLT 174.0 10/25/2023   GLUCOSE 93 11/02/2023   CHOL 170 12/21/2023   TRIG 132.0 12/21/2023   HDL 66.00 12/21/2023   LDLDIRECT 131.0 03/19/2013   LDLCALC 78 12/21/2023   ALT 19 12/21/2023   AST 19 12/21/2023   NA 143 11/02/2023   K 4.3 11/02/2023   CL 103 11/02/2023   CREATININE 1.32 (H) 11/02/2023   BUN 14 11/02/2023   CO2 32 11/02/2023   TSH 3.01 12/21/2023   INR 1.2 03/12/2021   HGBA1C 6.3 12/21/2023    US  RENAL Result Date: 12/14/2023 CLINICAL DATA:  Chronic kidney disease EXAM: RENAL / URINARY TRACT ULTRASOUND COMPLETE COMPARISON:  CT 03/12/2021 FINDINGS: Right Kidney: Renal measurements: 8.4 x 4 x 2.8 cm = volume: 50.4 mL. Echogenicity appears within normal limits. No hydronephrosis. Small cyst at the lower pole measuring 19 mm, no specific imaging follow-up is recommended. Left Kidney: Renal measurements: 9.3 x 5.3 x 4.1 cm = volume: 105.2 mL. Echogenicity is normal. Large exophytic cyst off the upper pole measuring 9 x 8 x 7.4 cm, no specific imaging follow-up is recommended. Bladder: Appears normal for degree of bladder distention. Other: None. IMPRESSION: Negative for hydronephrosis. Electronically Signed   By: Luke Bun M.D.   On: 12/14/2023 20:12       Assessment & Plan:  Health care maintenance Assessment & Plan: Physical today 03/28/24. Mammogram 04/20/22 - Birads 0. F/u right breast mammogram 05/04/22 -  Birads II. Recommend continue annual screening. Mammogram 07/06/23 - Birads I. Colonoscopy 03/23/22 - Diverticulosis in the sigmoid colon. Non-bleeding internal hemorrhoids. Stool in the transverse colon, in the  ascending colon and in the cecum. Pathology - negative microscopic colitis. Repeat colonoscopy one year. Declined.    Hyperglycemia -     Hemoglobin A1c  Hypothyroidism, unspecified type  Hypercholesterolemia -     Lipid panel -     Hepatic function panel  Stage 3b chronic kidney disease (HCC) -     Basic metabolic panel with GFR  Visit for screening mammogram -     3D Screening Mammogram, Left and Right; Future  Other orders -     Dapagliflozin  Propanediol; Take 1 tablet (10 mg total) by mouth daily.  Dispense: 90 tablet; Refill: 1 -     Omeprazole ; Take 2 capusles in the am and 1 capsule in the pm.  Dispense: 270 capsule; Refill: 1 -     Ozempic (0.25 or 0.5 MG/DOSE); Inject 0.25 mg into the skin once a week.  Dispense: 9 mL; Refill: 1     Allena Hamilton, MD

## 2024-04-01 ENCOUNTER — Ambulatory Visit: Payer: Self-pay | Admitting: Internal Medicine

## 2024-04-01 NOTE — Progress Notes (Unsigned)
 Cardiology Office Note  Date:  04/02/2024   ID:  Amanda Browning, Amanda Browning Nov 11, 1945, MRN [id number]  PCP:  Glendia Shad, MD   Chief Complaint  Patient presents with   6 month follow up     Patient denies any cardiac concerns.     HPI:  Amanda Browning is a 78 y.o. female with a hx of  hypertension,  hyperlipidemia  [facility] for GI bleed,  A. fib RVR , acute diastolic CHF Started on Eliquis  March 26, 2021 Torsemide  dose decreased for renal dysfunction Admission to [facility] diastolic CHF requiring IV Lasix , discharged on torsemide  40 daily, BNP 411, had 11 L diuresis 15 pounds down Baseline weight 205 pounds Echocardiogram on 04/21/21 showed new RV dilation and systolic dysfunction.  CTA showed acute right lower lobe pulmonary embolism. Large hiatal hernia EF 55 to 60%, moderately dilated left atrium, RVSP 46 History of cardioversion 2023 Who presents for routine follow-up of her diastolic CHF, afib  LOV with myself 10/24 Seen by one of our providers April 2025 Weight on last clinic visit 217 pounds Weight today 217 pounds  Retired, sedentary since retirement 1 year ago Takes torsemide  PRN Has required less torsemide  since she has started Farxiga  1 year ago Denies significant shortness of breath, no leg swelling no abdominal distention No chest pain on exertion  Denies tachycardia or palpitations concerning for atrial fibrillation  Reports primary care going to start Ozempic   Lab work reviewed Creatinine 1.6 up from 1.3 Hemoglobin A1c 5.6 Cholesterol 192 LDL 98  EKG personally reviewed by myself on todays visit EKG Interpretation Date/Time:  Tuesday April 02 2024 09:01:08 EST Ventricular Rate:  55 PR Interval:  244 QRS Duration:  88 QT Interval:  450 QTC Calculation: 430 R Axis:   4  Text Interpretation: Sinus bradycardia with 1st degree A-V block When compared with ECG of 27-Jul-2023 11:25, No significant change was found Confirmed by  Perla Lye [phone]) on 04/02/2024 9:18:59 AM   Large hiatal hernia on CT 11/22 Wondering whether to have surgery  Prior records from November 2022  GI bleeding 4 days prior to admission symptoms of hypotensive, weakness Hemoglobin 7.2 on arrival, was transfused EGD November 28, ulcers noted, no active bleeding, started on PPI twice daily   PMH:   has a past medical history of Anemia, Chicken pox, Colon polyps, Complication of anesthesia, DDD (degenerative disc disease), cervical (02/05/2013), GERD (gastroesophageal reflux disease), Heart murmur, History of atrial fibrillation, History of chicken pox, Hypercholesterolemia, Hyperlipidemia, Hypertension, Hypothyroidism, Migraines, PONV (postoperative nausea and vomiting), Sleep apnea, and Squamous cell skin cancer.  PSH:    Past Surgical History:  Procedure Laterality Date   BREAST BIOPSY Right 2012   stereotactic   CARDIOVERSION N/A 04/29/2021   Procedure: CARDIOVERSION;  Surgeon: Perla Lye PARAS, MD;  Location: [facility] ORS;  Service: Cardiovascular;  Laterality: N/A;   CATARACT EXTRACTION W/PHACO Left 09/06/2021   Procedure: CATARACT EXTRACTION PHACO AND INTRAOCULAR LENS PLACEMENT (IOC) LEFT 6.60 00:43.8;  Surgeon: Myrna Adine Anes, MD;  Location: [facility];  Service: Ophthalmology;  Laterality: Left;   CATARACT EXTRACTION W/PHACO Right 10/04/2021   Procedure: CATARACT EXTRACTION PHACO AND INTRAOCULAR LENS PLACEMENT (IOC) RIGHT 6.08 00:45.0;  Surgeon: Myrna Adine Anes, MD;  Location: [facility];  Service: Ophthalmology;  Laterality: Right;   COLONOSCOPY N/A 03/23/2022   Procedure: COLONOSCOPY;  Surgeon: Toledo, Ladell MARLA, MD;  Location: [facility] ENDOSCOPY;  Service: Gastroenterology;  Laterality: N/A;   COLONOSCOPY WITH PROPOFOL  N/A 02/23/2018  Procedure: COLONOSCOPY WITH PROPOFOL ;  Surgeon: Viktoria Lamar DASEN, MD;  Location: [facility] ENDOSCOPY;  Service: Endoscopy;  Laterality: N/A;   ESOPHAGOGASTRODUODENOSCOPY N/A  03/15/2021   Procedure: ESOPHAGOGASTRODUODENOSCOPY (EGD);  Surgeon: Unk Corinn Skiff, MD;  Location: [facility] ENDOSCOPY;  Service: Gastroenterology;  Laterality: N/A;   ESOPHAGOGASTRODUODENOSCOPY N/A 03/23/2022   Procedure: ESOPHAGOGASTRODUODENOSCOPY (EGD);  Surgeon: Toledo, Ladell POUR, MD;  Location: [facility] ENDOSCOPY;  Service: Gastroenterology;  Laterality: N/A;   KNEE ARTHROPLASTY Right 04/17/2017   Procedure: COMPUTER ASSISTED TOTAL KNEE ARTHROPLASTY;  Surgeon: Mardee Lynwood SQUIBB, MD;  Location: [facility] ORS;  Service: Orthopedics;  Laterality: Right;   SKIN BIOPSY     VAGINAL HYSTERECTOMY  1994   ovaries not removed   WISDOM TOOTH EXTRACTION      Current Outpatient Medications  Medication Sig Dispense Refill   acetaminophen  (TYLENOL ) 325 MG tablet Take 650 mg by mouth 4 (four) times daily as needed.     albuterol  (VENTOLIN  HFA) 108 (90 Base) MCG/ACT inhaler Inhale 2 puffs into the lungs every 6 (six) hours as needed for wheezing or shortness of breath. 8 g 2   cetirizine  (ZYRTEC ) 10 MG tablet Take 10 mg by mouth every evening.     dapagliflozin  propanediol (FARXIGA ) 10 MG TABS tablet Take 1 tablet (10 mg total) by mouth daily. 90 tablet 1   diltiazem  (CARDIZEM  CD) 240 MG 24 hr capsule TAKE 1 CAPSULE EVERY DAY 90 capsule 3   ELIQUIS  5 MG TABS tablet TAKE 1 TABLET TWICE DAILY 180 tablet 3   GEMTESA  75 MG TABS TAKE 1 TABLET EVERY DAY 90 tablet 3   levothyroxine  (SYNTHROID ) 50 MCG tablet TAKE 1 TABLET EVERY DAY 90 tablet 3   losartan  (COZAAR ) 25 MG tablet TAKE 1/2 TABLET EVERY MORNING 45 tablet 3   magnesium  oxide (MAG-OX) 400 MG tablet Take 1 tablet (400 mg total) by mouth 2 (two) times daily. 60 tablet 2   metoprolol  tartrate (LOPRESSOR ) 100 MG tablet TAKE 1 TABLET TWICE DAILY 180 tablet 3   Multiple Vitamin (MULTIVITAMIN WITH MINERALS) TABS tablet Take 1 tablet by mouth in the morning.     omeprazole  (PRILOSEC) 20 MG capsule Take 2 capusles in the am and 1 capsule in the pm. 270 capsule 1    potassium chloride  (MICRO-K ) 10 MEQ CR capsule Take 1 tablet (10 Meq) twice weekly with Torsemide . May take 1 extra tablet weekly as needed with Torsemide . 36 capsule 3   rosuvastatin  (CRESTOR ) 20 MG tablet Take 1 tablet (20 mg total) by mouth daily. 90 tablet 3   sertraline  (ZOLOFT ) 50 MG tablet TAKE 1 TABLET EVERY DAY 90 tablet 3   spironolactone  (ALDACTONE ) 25 MG tablet TAKE 1 TABLET EVERY DAY 90 tablet 3   torsemide  (DEMADEX ) 20 MG tablet Take Torsemide  1 tablet ( 20 MG) twice weekly. May take 1 extra tablet ( 20 MG ) once weekly as needed for shortness of breath. 36 tablet 3   Semaglutide ,0.25 or 0.5MG /DOS, (OZEMPIC , 0.25 OR 0.5 MG/DOSE,) 2 MG/3ML SOPN Inject 0.25 mg into the skin once a week. (Patient not taking: Reported on 04/02/2024) 9 mL 1   No current facility-administered medications for this visit.    Allergies:   Codeine   Social History:  The patient  reports that she has never smoked. She has never been exposed to tobacco smoke. She has never used smokeless tobacco. She reports that she does not drink alcohol and does not use drugs.   Family History:   family history includes Breast cancer (age  of onset: 50) in her sister; Cancer in her sister; Diabetes type II in her brother; Heart attack in her father and mother; Heart disease in her father and mother; Hyperlipidemia in her father; Hypertension in her brother, father, and mother; Ovarian cancer in her paternal aunt; Stroke in her father and mother.    Review of Systems: Review of Systems  Constitutional: Negative.   HENT: Negative.    Respiratory: Negative.    Cardiovascular: Negative.   Gastrointestinal: Negative.   Musculoskeletal: Negative.   Neurological: Negative.   Psychiatric/Behavioral: Negative.    All other systems reviewed and are negative.   PHYSICAL EXAM: VS:  BP 116/74 (BP Location: Left Arm, Patient Position: Sitting, Cuff Size: Large)   Pulse (!) 55   Ht 5' 3 (1.6 m)   Wt 217 lb 6 oz (98.6 kg)    SpO2 94%   BMI 38.51 kg/m  , BMI Body mass index is 38.51 kg/m. Constitutional:  oriented to person, place, and time. No distress.  HENT:  Head: Grossly normal Eyes:  no discharge. No scleral icterus.  Neck: No JVD, no carotid bruits  Cardiovascular: Regular rate and rhythm, no murmurs appreciated Pulmonary/Chest: Clear to auscultation bilaterally, no wheezes or rails Abdominal: Soft.  no distension.  no tenderness.  Musculoskeletal: Normal range of motion Neurological:  normal muscle tone. Coordination normal. No atrophy Skin: Skin warm and dry Psychiatric: normal affect, pleasant   Recent Labs: 10/25/2023: Hemoglobin 14.6; Platelets 174.0 12/21/2023: TSH 3.01 03/28/2024: ALT 20; BUN 25; Creatinine, Ser 1.60; Potassium 4.7; Sodium 142    Lipid Panel Lab Results  Component Value Date   CHOL 192 03/28/2024   HDL 70.80 03/28/2024   LDLCALC 98 03/28/2024   TRIG 117.0 03/28/2024    Wt Readings from Last 3 Encounters:  04/02/24 217 lb 6 oz (98.6 kg)  12/21/23 220 lb (99.8 kg)  11/02/23 215 lb (97.5 kg)     ASSESSMENT AND PLAN:  Problem List Items Addressed This Visit       Cardiology Problems   Hypertension   Hypercholesterolemia   Atrial fibrillation (HCC) - Primary   Relevant Orders   EKG 12-Lead (Completed)     Other   OSA (obstructive sleep apnea)   Other Visit Diagnoses       Chronic diastolic heart failure (HCC)       Relevant Orders   EKG 12-Lead (Completed)     Essential hypertension       Relevant Orders   EKG 12-Lead (Completed)     Dyspnea, unspecified type           Persistent atrial fibrillation Prior cardioversion Maintaining normal sinus rhythm, continue Eliquis  5 twice daily, metoprolol  tartrate 100 twice daily, diltiazem  ER 240  Chronic diastolic CHF Continue Farxiga ,  torsemide  20 mg daily as needed for ankle swelling or abdominal distention, weight gain, worsening shortness of breath with potassium  Essential hypertension Blood  pressure is well controlled on today's visit. No changes made to the medications.  Hiatal hernia GERD symptoms well-controlled on PPI  Obesity Low carbohydrate diet recommended, primary care starting Ozempic  Recommend regular walking program for conditioning    Signed, Tim Eustacia Urbanek, M.D., Ph.D. [facility] [place], [place] [phone]

## 2024-04-02 ENCOUNTER — Ambulatory Visit: Admitting: Cardiovascular Disease

## 2024-04-02 ENCOUNTER — Encounter: Payer: Self-pay | Admitting: Cardiovascular Disease

## 2024-04-02 VITALS — BP 116/74 | HR 55 | Ht 63.0 in | Wt 217.4 lb

## 2024-04-02 DIAGNOSIS — E7800 Pure hypercholesterolemia, unspecified: Secondary | ICD-10-CM

## 2024-04-02 DIAGNOSIS — R0600 Dyspnea, unspecified: Secondary | ICD-10-CM

## 2024-04-02 DIAGNOSIS — I5032 Chronic diastolic (congestive) heart failure: Secondary | ICD-10-CM

## 2024-04-02 DIAGNOSIS — I10 Essential (primary) hypertension: Secondary | ICD-10-CM

## 2024-04-02 DIAGNOSIS — G4733 Obstructive sleep apnea (adult) (pediatric): Secondary | ICD-10-CM

## 2024-04-02 DIAGNOSIS — I4819 Other persistent atrial fibrillation: Secondary | ICD-10-CM

## 2024-04-02 NOTE — Patient Instructions (Addendum)
 Medication Instructions:  No changes  If you need a refill on your cardiac medications before your next appointment, please call your pharmacy.    Lab work: No new labs needed   Testing/Procedures: No new testing needed   Follow-Up: At [facility], you and your health needs are our priority.  As part of our continuing mission to provide you with exceptional heart care, we have created designated Provider Care Teams.  These Care Teams include your primary Cardiologist (physician) and Advanced Practice Providers (APPs -  Physician Assistants and Nurse Practitioners) who all work together to provide you with the care you need, when you need it.  You will need a follow up appointment in 6 months  Providers on your designated Care Team:   Lonni Meager, NP Bernardino Bring, PA-C Cadence Franchester, [place]  COVID-19 Vaccine Information can be found at: [email or web address] For questions related to vaccine distribution or appointments, please email [email or web address] or call [phone].

## 2024-04-03 ENCOUNTER — Telehealth: Payer: Self-pay

## 2024-04-03 NOTE — Telephone Encounter (Signed)
 Copied from CRM [id number]. Topic: Clinical - Medication Question >> Apr 03, 2024  2:57 PM Viola F wrote: Reason for CRM: Patient received the Semaglutide ,0.25 or 0.5MG /DOS, (OZEMPIC , 0.25 OR 0.5 MG/DOSE,) 2 MG/3ML SOPN in the mail today..doesn't know anything about it and wants to know how she should store it? Please call her at  [phone]

## 2024-04-03 NOTE — Telephone Encounter (Signed)
 Spoke to pt daughter informed them  to store in fridge. Will start meds after christmas

## 2024-04-07 ENCOUNTER — Encounter: Payer: Self-pay | Admitting: Internal Medicine

## 2024-04-07 NOTE — Assessment & Plan Note (Signed)
 Low carb diet and exercise. Follow met b and A1c. Discussed GLP 1 - ozempic  - with elevated sugar and CKD.  Lab Results  Component Value Date   HGBA1C 5.6 03/28/2024

## 2024-04-07 NOTE — Assessment & Plan Note (Signed)
 Seeing nephrology. Felt stable. No changes. Continue farxiga  and losartan . Also on aldactone . Follow metabolic panel.

## 2024-04-07 NOTE — Assessment & Plan Note (Signed)
 Follow up with Dr Aundria 02/15/24 - f/u GERD - recommended omeprazoloe 40mg  bid.

## 2024-04-07 NOTE — Assessment & Plan Note (Signed)
 On eliquis .  Continues on diltiazem  and metoprolol .  Appears to be in SR today. Stable. No change in medication today.

## 2024-04-07 NOTE — Assessment & Plan Note (Signed)
 Dr Francisca - PVR 1cc.  Recommended Gemtesa .  06/2022 - reevaluation - recommended continue gemtesa . Gemtesa  helped.

## 2024-04-07 NOTE — Assessment & Plan Note (Signed)
 Continue cpap.

## 2024-04-07 NOTE — Assessment & Plan Note (Signed)
 On thyroid replacement.  Follow tsh.

## 2024-04-07 NOTE — Assessment & Plan Note (Signed)
 Cotinue zoloft . Stable.

## 2024-04-07 NOTE — Assessment & Plan Note (Signed)
 Follow up with Dr Aundria 02/15/24 - f/u GERD - recommended omeprazoloe 40mg  bid. Avoid eating late.

## 2024-04-07 NOTE — Assessment & Plan Note (Signed)
 Currently on cardizem  and lopressor . Also taking losartan  and aldactone .  Follow pressures. Follow metabolic panel.

## 2024-04-23 ENCOUNTER — Ambulatory Visit: Admitting: Cardiovascular Disease

## 2024-04-23 ENCOUNTER — Other Ambulatory Visit: Payer: Self-pay | Admitting: Cardiovascular Disease

## 2024-04-23 DIAGNOSIS — I4819 Other persistent atrial fibrillation: Secondary | ICD-10-CM

## 2024-04-23 NOTE — Telephone Encounter (Signed)
 Prescription refill request for Eliquis  received. Indication:afib Last office visit:12/25 Scr: 1.60  12/25 Age:79 Weight:98.2  kg  Prescription refilled

## 2024-05-09 ENCOUNTER — Ambulatory Visit: Admitting: Internal Medicine

## 2024-07-30 ENCOUNTER — Ambulatory Visit: Admitting: Internal Medicine

## 2024-08-01 ENCOUNTER — Ambulatory Visit: Admitting: Internal Medicine

## 2024-11-01 ENCOUNTER — Ambulatory Visit: Admitting: Urology
